# Patient Record
Sex: Male | Born: 1955 | Race: White | Hispanic: No | Marital: Single | Smoking: Former smoker
Health system: Southern US, Community
[De-identification: ages and names within clinical notes are randomized; demographics above are authoritative.]

## PROBLEM LIST (undated history)

## (undated) DIAGNOSIS — I10 Essential (primary) hypertension: Secondary | ICD-10-CM

## (undated) DIAGNOSIS — N189 Chronic kidney disease, unspecified: Secondary | ICD-10-CM

## (undated) DIAGNOSIS — I5022 Chronic systolic (congestive) heart failure: Secondary | ICD-10-CM

## (undated) DIAGNOSIS — R4789 Other speech disturbances: Secondary | ICD-10-CM

## (undated) DIAGNOSIS — I639 Cerebral infarction, unspecified: Secondary | ICD-10-CM

## (undated) DIAGNOSIS — E119 Type 2 diabetes mellitus without complications: Secondary | ICD-10-CM

## (undated) DIAGNOSIS — E785 Hyperlipidemia, unspecified: Secondary | ICD-10-CM

## (undated) DIAGNOSIS — Q159 Congenital malformation of eye, unspecified: Secondary | ICD-10-CM

## (undated) HISTORY — PX: ORIF ANKLE DISLOCATION: SUR918

## (undated) HISTORY — DX: Chronic kidney disease, unspecified: N18.9

---

## 2013-06-15 ENCOUNTER — Other Ambulatory Visit: Payer: Self-pay

## 2013-06-15 ENCOUNTER — Encounter (HOSPITAL_COMMUNITY): Payer: Self-pay | Admitting: Pharmacy Technician

## 2013-06-15 ENCOUNTER — Encounter (HOSPITAL_COMMUNITY): Payer: Self-pay

## 2013-06-15 ENCOUNTER — Encounter (HOSPITAL_COMMUNITY)
Admission: RE | Admit: 2013-06-15 | Discharge: 2013-06-15 | Disposition: A | Discharge status: Home / Self Care | Payer: Medicare Other | Source: Ambulatory Visit | Attending: Ophthalmology | Admitting: Ophthalmology

## 2013-06-15 HISTORY — DX: Type 2 diabetes mellitus without complications: E11.9

## 2013-06-15 HISTORY — DX: Congenital malformation of eye, unspecified: Q15.9

## 2013-06-15 HISTORY — DX: Essential (primary) hypertension: I10

## 2013-06-15 HISTORY — DX: Hyperlipidemia, unspecified: E78.5

## 2013-06-15 HISTORY — DX: Other speech disturbances: R47.89

## 2013-06-15 HISTORY — DX: Cerebral infarction, unspecified: I63.9

## 2013-06-15 LAB — HEMOGLOBIN AND HEMATOCRIT, BLOOD
HCT: 42.2 % (ref 39.0–52.0)
Hemoglobin: 14.7 g/dL (ref 13.0–17.0)

## 2013-06-15 LAB — BASIC METABOLIC PANEL
BUN: 19 mg/dL (ref 6–23)
CALCIUM: 9.6 mg/dL (ref 8.4–10.5)
CO2: 27 meq/L (ref 19–32)
CREATININE: 0.55 mg/dL (ref 0.50–1.35)
Chloride: 98 mEq/L (ref 96–112)
GFR calc Af Amer: >90 mL/min (ref >90)
Glucose, Bld: 181 mg/dL — ABNORMAL HIGH (ref 70–99)
Potassium: 4.3 mEq/L (ref 3.7–5.3)
SODIUM: 137 meq/L (ref 137–147)

## 2013-06-15 NOTE — Pre-Procedure Instructions (Signed)
Patient given information to sign up for my chart at home. 

## 2013-06-15 NOTE — Patient Instructions (Addendum)
Your procedure is scheduled on: 06/18/2013  Report to [facility] at  800  AM.  Call this number if you have problems the morning of surgery: [phone]   Do not eat food or drink liquids :After Midnight.      Take these medicines the morning of surgery with A SIP OF WATER: lisinopril, atenolol, amlodipine   Do not wear jewelry, make-up or nail polish.  Do not wear lotions, powders, or perfumes.   Do not shave 48 hours prior to surgery.  Do not bring valuables to the hospital.  Contacts, dentures or bridgework may not be worn into surgery.  Leave suitcase in the car. After surgery it may be brought to your room.  For patients admitted to the hospital, checkout time is 11:00 AM the day of discharge.   Patients discharged the day of surgery will not be allowed to drive home.  :     Please read over the following fact sheets that you were given: Coughing and Deep Breathing, Surgical Site Infection Prevention, Anesthesia Post-op Instructions and Care and Recovery After Surgery    Cataract A cataract is a clouding of the lens of the eye. When a lens becomes cloudy, vision is reduced based on the degree and nature of the clouding. Many cataracts reduce vision to some degree. Some cataracts make people more near-sighted as they develop. Other cataracts increase glare. Cataracts that are ignored and become worse can sometimes look white. The white color can be seen through the pupil. CAUSES   Aging. However, cataracts may occur at any age, even in newborns.   Certain drugs.   Trauma to the eye.   Certain diseases such as diabetes.   Specific eye diseases such as chronic inflammation inside the eye or a sudden attack of a rare form of glaucoma.   Inherited or acquired medical problems.  SYMPTOMS   Gradual, progressive drop in vision in the affected eye.   Severe, rapid visual loss. This most often happens when trauma is the cause.  DIAGNOSIS  To detect a cataract, an eye doctor  examines the lens. Cataracts are best diagnosed with an exam of the eyes with the pupils enlarged (dilated) by drops.  TREATMENT  For an early cataract, vision may improve by using different eyeglasses or stronger lighting. If that does not help your vision, surgery is the only effective treatment. A cataract needs to be surgically removed when vision loss interferes with your everyday activities, such as driving, reading, or watching TV. A cataract may also have to be removed if it prevents examination or treatment of another eye problem. Surgery removes the cloudy lens and usually replaces it with a substitute lens (intraocular lens, IOL).  At a time when both you and your doctor agree, the cataract will be surgically removed. If you have cataracts in both eyes, only one is usually removed at a time. This allows the operated eye to heal and be out of danger from any possible problems after surgery (such as infection or poor wound healing). In rare cases, a cataract may be doing damage to your eye. In these cases, your caregiver may advise surgical removal right away. The vast majority of people who have cataract surgery have better vision afterward. HOME CARE INSTRUCTIONS  If you are not planning surgery, you may be asked to do the following:  Use different eyeglasses.   Use stronger or brighter lighting.   Ask your eye doctor about reducing your medicine dose or changing  medicines if it is thought that a medicine caused your cataract. Changing medicines does not make the cataract go away on its own.   Become familiar with your surroundings. Poor vision can lead to injury. Avoid bumping into things on the affected side. You are at a higher risk for tripping or falling.   Exercise extreme care when driving or operating machinery.   Wear sunglasses if you are sensitive to bright [place] or experiencing problems with glare.  SEEK IMMEDIATE MEDICAL CARE IF:   You have a worsening or sudden vision  loss.   You notice redness, swelling, or increasing pain in the eye.   You have a fever.  Document Released: 01/25/2005 Document Revised: 01/14/2011 Document Reviewed: 09/18/2010 [facility] Patient Information 2012 [email or web address].PATIENT INSTRUCTIONS POST-ANESTHESIA  IMMEDIATELY FOLLOWING SURGERY:  Do not drive or operate machinery for the first twenty four hours after surgery.  Do not make any important decisions for twenty four hours after surgery or while taking narcotic pain medications or sedatives.  If you develop intractable nausea and vomiting or a severe headache please notify your doctor immediately.  FOLLOW-UP:  Please make an appointment with your surgeon as instructed. You do not need to follow up with anesthesia unless specifically instructed to do so.  WOUND CARE INSTRUCTIONS (if applicable):  Keep a dry clean dressing on the anesthesia/puncture wound site if there is drainage.  Once the wound has quit draining you may leave it open to air.  Generally you should leave the bandage intact for twenty four hours unless there is drainage.  If the epidural site drains for more than 36-48 hours please call the anesthesia department.  QUESTIONS?:  Please feel free to call your physician or the hospital operator if you have any questions, and they will be happy to assist you.

## 2013-06-18 ENCOUNTER — Encounter (HOSPITAL_COMMUNITY)
Admission: RE | Disposition: A | Discharge status: Home / Self Care | Payer: Self-pay | Source: Ambulatory Visit | Attending: Ophthalmology

## 2013-06-18 ENCOUNTER — Encounter (HOSPITAL_COMMUNITY): Payer: Self-pay | Admitting: Ophthalmology

## 2013-06-18 ENCOUNTER — Ambulatory Visit (HOSPITAL_COMMUNITY): Payer: Medicare Other | Admitting: Anesthesiology

## 2013-06-18 ENCOUNTER — Encounter (HOSPITAL_COMMUNITY): Payer: Medicare Other | Admitting: Anesthesiology

## 2013-06-18 ENCOUNTER — Ambulatory Visit (HOSPITAL_COMMUNITY)
Admission: RE | Admit: 2013-06-18 | Discharge: 2013-06-18 | Disposition: A | Discharge status: Home / Self Care | Payer: Medicare Other | Source: Ambulatory Visit | Attending: Ophthalmology | Admitting: Ophthalmology

## 2013-06-18 DIAGNOSIS — H2510 Age-related nuclear cataract, unspecified eye: Secondary | ICD-10-CM | POA: Insufficient documentation

## 2013-06-18 DIAGNOSIS — E119 Type 2 diabetes mellitus without complications: Secondary | ICD-10-CM | POA: Insufficient documentation

## 2013-06-18 DIAGNOSIS — Z87891 Personal history of nicotine dependence: Secondary | ICD-10-CM | POA: Insufficient documentation

## 2013-06-18 DIAGNOSIS — I10 Essential (primary) hypertension: Secondary | ICD-10-CM | POA: Insufficient documentation

## 2013-06-18 DIAGNOSIS — I6990 Unspecified sequelae of unspecified cerebrovascular disease: Secondary | ICD-10-CM | POA: Insufficient documentation

## 2013-06-18 HISTORY — PX: CATARACT EXTRACTION W/PHACO: SHX586

## 2013-06-18 LAB — GLUCOSE, CAPILLARY: GLUCOSE-CAPILLARY: 210 mg/dL — AB (ref 70–99)

## 2013-06-18 SURGERY — PHACOEMULSIFICATION, CATARACT, WITH IOL INSERTION
Anesthesia: Monitor Anesthesia Care | Site: Eye | Laterality: Right

## 2013-06-18 MED ORDER — PHENYLEPHRINE HCL 2.5 % OP SOLN
1.0000 [drp] | OPHTHALMIC | Status: AC
  Administered 2013-06-18 (×3): 1 [drp] via OPHTHALMIC

## 2013-06-18 MED ORDER — LACTATED RINGERS IV SOLN
INTRAVENOUS | Status: DC
  Administered 2013-06-18: 09:00:00 via INTRAVENOUS

## 2013-06-18 MED ORDER — MIDAZOLAM HCL 2 MG/2ML IJ SOLN
1.0000 mg | INTRAMUSCULAR | Status: DC | PRN
  Administered 2013-06-18: 2 mg via INTRAVENOUS

## 2013-06-18 MED ORDER — EPINEPHRINE HCL 1 MG/ML IJ SOLN
INTRAMUSCULAR | Status: DC | PRN
  Administered 2013-06-18: 10:00:00

## 2013-06-18 MED ORDER — POVIDONE-IODINE 5 % OP SOLN
OPHTHALMIC | Status: DC | PRN
  Administered 2013-06-18: 1 via OPHTHALMIC

## 2013-06-18 MED ORDER — LIDOCAINE HCL (PF) 1 % IJ SOLN
INTRAMUSCULAR | Status: DC | PRN
  Administered 2013-06-18: .6 mL

## 2013-06-18 MED ORDER — NEOMYCIN-POLYMYXIN-DEXAMETH 3.5-10000-0.1 OP SUSP
OPHTHALMIC | Status: DC | PRN
  Administered 2013-06-18: 2 [drp] via OPHTHALMIC

## 2013-06-18 MED ORDER — TETRACAINE HCL 0.5 % OP SOLN
1.0000 [drp] | OPHTHALMIC | Status: AC
  Administered 2013-06-18 (×3): 1 [drp] via OPHTHALMIC

## 2013-06-18 MED ORDER — LIDOCAINE HCL (PF) 1 % IJ SOLN
INTRAMUSCULAR | Status: AC
  Filled 2013-06-18: qty 2

## 2013-06-18 MED ORDER — BSS IO SOLN
INTRAOCULAR | Status: DC | PRN
  Administered 2013-06-18: 15 mL via INTRAOCULAR

## 2013-06-18 MED ORDER — TETRACAINE HCL 0.5 % OP SOLN
OPHTHALMIC | Status: AC
  Filled 2013-06-18: qty 2

## 2013-06-18 MED ORDER — MIDAZOLAM HCL 2 MG/2ML IJ SOLN
INTRAMUSCULAR | Status: AC
  Filled 2013-06-18: qty 2

## 2013-06-18 MED ORDER — CYCLOPENTOLATE-PHENYLEPHRINE 0.2-1 % OP SOLN
1.0000 [drp] | OPHTHALMIC | Status: AC
  Administered 2013-06-18 (×3): 1 [drp] via OPHTHALMIC

## 2013-06-18 MED ORDER — PROVISC 10 MG/ML IO SOLN
INTRAOCULAR | Status: DC | PRN
  Administered 2013-06-18: 0.85 mL via INTRAOCULAR

## 2013-06-18 MED ORDER — FENTANYL CITRATE 0.05 MG/ML IJ SOLN
25.0000 ug | INTRAMUSCULAR | Status: AC
  Administered 2013-06-18 (×2): 25 ug via INTRAVENOUS

## 2013-06-18 MED ORDER — LIDOCAINE HCL 3.5 % OP GEL
OPHTHALMIC | Status: AC
  Filled 2013-06-18: qty 1

## 2013-06-18 MED ORDER — EPINEPHRINE HCL 1 MG/ML IJ SOLN
INTRAMUSCULAR | Status: AC
  Filled 2013-06-18: qty 1

## 2013-06-18 MED ORDER — PHENYLEPHRINE HCL 2.5 % OP SOLN
OPHTHALMIC | Status: AC
  Filled 2013-06-18: qty 15

## 2013-06-18 MED ORDER — NEOMYCIN-POLYMYXIN-DEXAMETH 3.5-10000-0.1 OP SUSP
OPHTHALMIC | Status: AC
  Filled 2013-06-18: qty 5

## 2013-06-18 MED ORDER — CYCLOPENTOLATE-PHENYLEPHRINE OP SOLN OPTIME - NO CHARGE
OPHTHALMIC | Status: AC
  Filled 2013-06-18: qty 2

## 2013-06-18 MED ORDER — LIDOCAINE HCL 3.5 % OP GEL
1.0000 "application " | Freq: Once | OPHTHALMIC | Status: AC
  Administered 2013-06-18: 1 via OPHTHALMIC

## 2013-06-18 MED ORDER — FENTANYL CITRATE 0.05 MG/ML IJ SOLN
INTRAMUSCULAR | Status: AC
  Filled 2013-06-18: qty 2

## 2013-06-18 SURGICAL SUPPLY — 33 items
CAPSULAR TENSION RING-AMO (OPHTHALMIC RELATED) IMPLANT
CLOTH BEACON ORANGE TIMEOUT ST (SAFETY) ×3 IMPLANT
EYE SHIELD UNIVERSAL CLEAR (GAUZE/BANDAGES/DRESSINGS) ×3 IMPLANT
GLOVE BIO SURGEON STRL SZ 6.5 (GLOVE) IMPLANT
GLOVE BIO SURGEONS STRL SZ 6.5 (GLOVE)
GLOVE BIOGEL PI IND STRL 6.5 (GLOVE) IMPLANT
GLOVE BIOGEL PI IND STRL 7.0 (GLOVE) ×1 IMPLANT
GLOVE BIOGEL PI IND STRL 7.5 (GLOVE) ×1 IMPLANT
GLOVE BIOGEL PI INDICATOR 6.5 (GLOVE)
GLOVE BIOGEL PI INDICATOR 7.0 (GLOVE) ×2
GLOVE BIOGEL PI INDICATOR 7.5 (GLOVE) ×2
GLOVE ECLIPSE 6.5 STRL STRAW (GLOVE) IMPLANT
GLOVE ECLIPSE 7.0 STRL STRAW (GLOVE) IMPLANT
GLOVE ECLIPSE 7.5 STRL STRAW (GLOVE) IMPLANT
GLOVE EXAM NITRILE LRG STRL (GLOVE) IMPLANT
GLOVE EXAM NITRILE MD LF STRL (GLOVE) IMPLANT
GLOVE SKINSENSE NS SZ6.5 (GLOVE)
GLOVE SKINSENSE NS SZ7.0 (GLOVE)
GLOVE SKINSENSE STRL SZ6.5 (GLOVE) IMPLANT
GLOVE SKINSENSE STRL SZ7.0 (GLOVE) IMPLANT
KIT VITRECTOMY (OPHTHALMIC RELATED) IMPLANT
PAD ARMBOARD 7.5X6 YLW CONV (MISCELLANEOUS) ×3 IMPLANT
PROC W NO LENS (INTRAOCULAR LENS)
PROC W SPEC LENS (INTRAOCULAR LENS)
PROCESS W NO LENS (INTRAOCULAR LENS) IMPLANT
PROCESS W SPEC LENS (INTRAOCULAR LENS) IMPLANT
RING MALYGIN (MISCELLANEOUS) IMPLANT
SIGHTPATH CAT PROC W REG LENS (Ophthalmic Related) ×3 IMPLANT
SYR TB 1ML LL NO SAFETY (SYRINGE) ×3 IMPLANT
TAPE SURG TRANSPORE 1 IN (GAUZE/BANDAGES/DRESSINGS) ×1 IMPLANT
TAPE SURGICAL TRANSPORE 1 IN (GAUZE/BANDAGES/DRESSINGS) ×2
VISCOELASTIC ADDITIONAL (OPHTHALMIC RELATED) IMPLANT
WATER STERILE IRR 250ML POUR (IV SOLUTION) ×3 IMPLANT

## 2013-06-18 NOTE — Discharge Instructions (Signed)

## 2013-06-18 NOTE — Op Note (Signed)
Date of Admission: 06/18/2013  Date of Surgery: 06/18/2013   Pre-Op Dx: Cataract Right Eye  Post-Op Dx: Nuclear Cataract Right  Eye,  Dx Code 366.16  Surgeon: Gemma Payor, M.D.  Assistants: None  Anesthesia: Topical with MAC  Indications: Painless, progressive loss of vision with compromise of daily activities.  Surgery: Cataract Extraction with Intraocular lens Implant Right Eye  Discription: The patient had dilating drops and viscous lidocaine placed into the Right eye in the pre-op holding area. After transfer to the operating room, a time out was performed. The patient was then prepped and draped. Beginning with a 75 degree blade a paracentesis port was made at the surgeon's 2 o'clock position. The anterior chamber was then filled with 1% non-preserved lidocaine. This was followed by filling the anterior chamber with Provisc.  A 2.18mm keratome blade was used to make a clear corneal incision at the temporal limbus.  A bent cystatome needle was used to create a continuous tear capsulotomy. Hydrodissection was performed with balanced salt solution on a Fine canula. The lens nucleus was then removed using the phacoemulsification handpiece. Residual cortex was removed with the I&A handpiece. The anterior chamber and capsular bag were refilled with Provisc. A posterior chamber intraocular lens was placed into the capsular bag with it's injector. The implant was positioned with the Kuglan hook. The Provisc was then removed from the anterior chamber and capsular bag with the I&A handpiece. Stromal hydration of the main incision and paracentesis port was performed with BSS on a Fine canula. The wounds were tested for leak which was negative. The patient tolerated the procedure well. There were no operative complications. The patient was then transferred to the recovery room in stable condition.  Complications: None  Specimen: None  EBL: None  Prosthetic device: Hoya iSert 250, power 19.0 D, SN  [id number].

## 2013-06-18 NOTE — Anesthesia Postprocedure Evaluation (Signed)
  Anesthesia Post-op Note  Patient: Matthew Mccormick  Procedure(s) Performed: Procedure(s) with comments: CATARACT EXTRACTION PHACO AND INTRAOCULAR LENS PLACEMENT (IOC) (Right) - CDE 34.55  Patient Location: Short Stay  Anesthesia Type:MAC  Level of Consciousness: awake, alert , oriented and patient cooperative  Airway and Oxygen Therapy: Patient Spontanous Breathing and Patient connected to nasal cannula oxygen  Post-op Pain: none  Post-op Assessment: Post-op Vital signs reviewed, Patient's Cardiovascular Status Stable, Respiratory Function Stable, Patent Airway and Pain level controlled  Post-op Vital Signs: Reviewed and stable  Last Vitals:  Filed Vitals:   06/18/13 0921  BP: 146/74  Resp: 14    Complications: No apparent anesthesia complications

## 2013-06-18 NOTE — Transfer of Care (Signed)
Immediate Anesthesia Transfer of Care Note  Patient: Matthew Mccormick  Procedure(s) Performed: Procedure(s) with comments: CATARACT EXTRACTION PHACO AND INTRAOCULAR LENS PLACEMENT (IOC) (Right) - CDE 34.55  Patient Location: Short Stay  Anesthesia Type:MAC  Level of Consciousness: awake, alert , oriented and patient cooperative  Airway & Oxygen Therapy: Patient Spontanous Breathing  Post-op Assessment: Report given to PACU RN, Post -op Vital signs reviewed and stable and Patient moving all extremities  Post vital signs: Reviewed and stable  Complications: No apparent anesthesia complications

## 2013-06-18 NOTE — Anesthesia Preprocedure Evaluation (Signed)
Anesthesia Evaluation  Patient identified by MRN, date of birth, ID band Patient awake    Reviewed: Allergy & Precautions, H&P , NPO status , Patient's Chart, lab work & pertinent test results, reviewed documented beta blocker date and time   Airway Mallampati: II TM Distance: >3 FB Neck ROM: Full    Dental  (+) Poor Dentition, Missing, Chipped   Pulmonary former smoker,  breath sounds clear to auscultation        Cardiovascular hypertension, Pt. on medications and Pt. on home beta blockers Rhythm:Regular Rate:Normal     Neuro/Psych CVA, Residual Symptoms    GI/Hepatic   Endo/Other  diabetes, Type 2  Renal/GU      Musculoskeletal   Abdominal   Peds  Hematology   Anesthesia Other Findings   Reproductive/Obstetrics                           Anesthesia Physical Anesthesia Plan  ASA: III  Anesthesia Plan: MAC   Post-op Pain Management:    Induction: Intravenous  Airway Management Planned: Nasal Cannula  Additional Equipment:   Intra-op Plan:   Post-operative Plan:   Informed Consent: I have reviewed the patients History and Physical, chart, labs and discussed the procedure including the risks, benefits and alternatives for the proposed anesthesia with the patient or authorized representative who has indicated his/her understanding and acceptance.     Plan Discussed with:   Anesthesia Plan Comments:         Anesthesia Quick Evaluation

## 2013-06-18 NOTE — H&P (Signed)
I have reviewed the H&P, the patient was re-examined, and I have identified no interval changes in medical condition and plan of care since the history and physical of record  

## 2013-06-19 ENCOUNTER — Encounter (HOSPITAL_COMMUNITY): Payer: Self-pay | Admitting: Ophthalmology

## 2015-02-26 DIAGNOSIS — Z87891 Personal history of nicotine dependence: Secondary | ICD-10-CM | POA: Diagnosis not present

## 2015-02-26 DIAGNOSIS — E1165 Type 2 diabetes mellitus with hyperglycemia: Secondary | ICD-10-CM | POA: Diagnosis not present

## 2015-02-26 DIAGNOSIS — Z6834 Body mass index (BMI) 34.0-34.9, adult: Secondary | ICD-10-CM | POA: Diagnosis not present

## 2015-02-26 DIAGNOSIS — R2681 Unsteadiness on feet: Secondary | ICD-10-CM | POA: Diagnosis not present

## 2015-03-29 DIAGNOSIS — L03114 Cellulitis of left upper limb: Secondary | ICD-10-CM | POA: Diagnosis not present

## 2015-03-29 DIAGNOSIS — Z833 Family history of diabetes mellitus: Secondary | ICD-10-CM | POA: Diagnosis not present

## 2015-03-29 DIAGNOSIS — R21 Rash and other nonspecific skin eruption: Secondary | ICD-10-CM | POA: Diagnosis not present

## 2015-03-29 DIAGNOSIS — Z8249 Family history of ischemic heart disease and other diseases of the circulatory system: Secondary | ICD-10-CM | POA: Diagnosis not present

## 2015-04-16 DIAGNOSIS — I10 Essential (primary) hypertension: Secondary | ICD-10-CM | POA: Diagnosis not present

## 2015-04-16 DIAGNOSIS — E119 Type 2 diabetes mellitus without complications: Secondary | ICD-10-CM | POA: Diagnosis not present

## 2015-05-14 DIAGNOSIS — E7800 Pure hypercholesterolemia, unspecified: Secondary | ICD-10-CM | POA: Diagnosis not present

## 2015-05-14 DIAGNOSIS — E1165 Type 2 diabetes mellitus with hyperglycemia: Secondary | ICD-10-CM | POA: Diagnosis not present

## 2015-05-22 DIAGNOSIS — I10 Essential (primary) hypertension: Secondary | ICD-10-CM | POA: Diagnosis not present

## 2015-05-22 DIAGNOSIS — E119 Type 2 diabetes mellitus without complications: Secondary | ICD-10-CM | POA: Diagnosis not present

## 2015-06-12 DIAGNOSIS — I10 Essential (primary) hypertension: Secondary | ICD-10-CM | POA: Diagnosis not present

## 2015-06-12 DIAGNOSIS — I639 Cerebral infarction, unspecified: Secondary | ICD-10-CM | POA: Diagnosis not present

## 2015-06-12 DIAGNOSIS — E119 Type 2 diabetes mellitus without complications: Secondary | ICD-10-CM | POA: Diagnosis not present

## 2015-06-13 DIAGNOSIS — E1165 Type 2 diabetes mellitus with hyperglycemia: Secondary | ICD-10-CM | POA: Diagnosis not present

## 2015-06-13 DIAGNOSIS — I10 Essential (primary) hypertension: Secondary | ICD-10-CM | POA: Diagnosis not present

## 2015-06-13 DIAGNOSIS — E7800 Pure hypercholesterolemia, unspecified: Secondary | ICD-10-CM | POA: Diagnosis not present

## 2015-07-15 DIAGNOSIS — E119 Type 2 diabetes mellitus without complications: Secondary | ICD-10-CM | POA: Diagnosis not present

## 2015-07-15 DIAGNOSIS — I10 Essential (primary) hypertension: Secondary | ICD-10-CM | POA: Diagnosis not present

## 2015-07-15 DIAGNOSIS — I639 Cerebral infarction, unspecified: Secondary | ICD-10-CM | POA: Diagnosis not present

## 2015-07-18 DIAGNOSIS — E7800 Pure hypercholesterolemia, unspecified: Secondary | ICD-10-CM | POA: Diagnosis not present

## 2015-07-18 DIAGNOSIS — N181 Chronic kidney disease, stage 1: Secondary | ICD-10-CM | POA: Diagnosis not present

## 2015-07-18 DIAGNOSIS — E1122 Type 2 diabetes mellitus with diabetic chronic kidney disease: Secondary | ICD-10-CM | POA: Diagnosis not present

## 2015-07-18 DIAGNOSIS — I10 Essential (primary) hypertension: Secondary | ICD-10-CM | POA: Diagnosis not present

## 2015-08-21 DIAGNOSIS — R0600 Dyspnea, unspecified: Secondary | ICD-10-CM | POA: Diagnosis not present

## 2015-08-21 DIAGNOSIS — E1165 Type 2 diabetes mellitus with hyperglycemia: Secondary | ICD-10-CM | POA: Diagnosis not present

## 2015-08-21 DIAGNOSIS — I639 Cerebral infarction, unspecified: Secondary | ICD-10-CM | POA: Diagnosis not present

## 2015-08-21 DIAGNOSIS — I129 Hypertensive chronic kidney disease with stage 1 through stage 4 chronic kidney disease, or unspecified chronic kidney disease: Secondary | ICD-10-CM | POA: Diagnosis not present

## 2015-08-21 DIAGNOSIS — Z9114 Patient's other noncompliance with medication regimen: Secondary | ICD-10-CM | POA: Diagnosis not present

## 2015-08-22 DIAGNOSIS — E1122 Type 2 diabetes mellitus with diabetic chronic kidney disease: Secondary | ICD-10-CM | POA: Diagnosis present

## 2015-08-22 DIAGNOSIS — Z79899 Other long term (current) drug therapy: Secondary | ICD-10-CM | POA: Diagnosis not present

## 2015-08-22 DIAGNOSIS — R279 Unspecified lack of coordination: Secondary | ICD-10-CM | POA: Diagnosis not present

## 2015-08-22 DIAGNOSIS — Z9114 Patient's other noncompliance with medication regimen: Secondary | ICD-10-CM | POA: Diagnosis not present

## 2015-08-22 DIAGNOSIS — R2689 Other abnormalities of gait and mobility: Secondary | ICD-10-CM | POA: Diagnosis not present

## 2015-08-22 DIAGNOSIS — R278 Other lack of coordination: Secondary | ICD-10-CM | POA: Diagnosis not present

## 2015-08-22 DIAGNOSIS — E1069 Type 1 diabetes mellitus with other specified complication: Secondary | ICD-10-CM | POA: Diagnosis not present

## 2015-08-22 DIAGNOSIS — I10 Essential (primary) hypertension: Secondary | ICD-10-CM | POA: Diagnosis not present

## 2015-08-22 DIAGNOSIS — E119 Type 2 diabetes mellitus without complications: Secondary | ICD-10-CM | POA: Diagnosis not present

## 2015-08-22 DIAGNOSIS — R262 Difficulty in walking, not elsewhere classified: Secondary | ICD-10-CM | POA: Diagnosis not present

## 2015-08-22 DIAGNOSIS — E1165 Type 2 diabetes mellitus with hyperglycemia: Secondary | ICD-10-CM | POA: Diagnosis present

## 2015-08-22 DIAGNOSIS — I639 Cerebral infarction, unspecified: Secondary | ICD-10-CM | POA: Diagnosis present

## 2015-08-22 DIAGNOSIS — Z299 Encounter for prophylactic measures, unspecified: Secondary | ICD-10-CM | POA: Diagnosis not present

## 2015-08-22 DIAGNOSIS — Z7982 Long term (current) use of aspirin: Secondary | ICD-10-CM | POA: Diagnosis not present

## 2015-08-22 DIAGNOSIS — R269 Unspecified abnormalities of gait and mobility: Secondary | ICD-10-CM | POA: Diagnosis present

## 2015-08-22 DIAGNOSIS — N189 Chronic kidney disease, unspecified: Secondary | ICD-10-CM | POA: Diagnosis present

## 2015-08-22 DIAGNOSIS — M6281 Muscle weakness (generalized): Secondary | ICD-10-CM | POA: Diagnosis not present

## 2015-08-22 DIAGNOSIS — I129 Hypertensive chronic kidney disease with stage 1 through stage 4 chronic kidney disease, or unspecified chronic kidney disease: Secondary | ICD-10-CM | POA: Diagnosis present

## 2015-08-26 DIAGNOSIS — Z9114 Patient's other noncompliance with medication regimen: Secondary | ICD-10-CM | POA: Diagnosis not present

## 2015-08-26 DIAGNOSIS — Z299 Encounter for prophylactic measures, unspecified: Secondary | ICD-10-CM | POA: Diagnosis not present

## 2015-08-26 DIAGNOSIS — R279 Unspecified lack of coordination: Secondary | ICD-10-CM | POA: Diagnosis not present

## 2015-08-26 DIAGNOSIS — R2689 Other abnormalities of gait and mobility: Secondary | ICD-10-CM | POA: Diagnosis not present

## 2015-08-26 DIAGNOSIS — R262 Difficulty in walking, not elsewhere classified: Secondary | ICD-10-CM | POA: Diagnosis not present

## 2015-08-26 DIAGNOSIS — M6281 Muscle weakness (generalized): Secondary | ICD-10-CM | POA: Diagnosis not present

## 2015-08-26 DIAGNOSIS — E1165 Type 2 diabetes mellitus with hyperglycemia: Secondary | ICD-10-CM | POA: Diagnosis not present

## 2015-08-26 DIAGNOSIS — N189 Chronic kidney disease, unspecified: Secondary | ICD-10-CM | POA: Diagnosis not present

## 2015-08-26 DIAGNOSIS — E1069 Type 1 diabetes mellitus with other specified complication: Secondary | ICD-10-CM | POA: Diagnosis not present

## 2015-08-26 DIAGNOSIS — I129 Hypertensive chronic kidney disease with stage 1 through stage 4 chronic kidney disease, or unspecified chronic kidney disease: Secondary | ICD-10-CM | POA: Diagnosis not present

## 2015-08-26 DIAGNOSIS — E119 Type 2 diabetes mellitus without complications: Secondary | ICD-10-CM | POA: Diagnosis not present

## 2015-08-26 DIAGNOSIS — I639 Cerebral infarction, unspecified: Secondary | ICD-10-CM | POA: Diagnosis not present

## 2015-08-26 DIAGNOSIS — I10 Essential (primary) hypertension: Secondary | ICD-10-CM | POA: Diagnosis not present

## 2015-08-26 DIAGNOSIS — R278 Other lack of coordination: Secondary | ICD-10-CM | POA: Diagnosis not present

## 2015-08-28 DIAGNOSIS — E119 Type 2 diabetes mellitus without complications: Secondary | ICD-10-CM | POA: Diagnosis not present

## 2015-08-28 DIAGNOSIS — I639 Cerebral infarction, unspecified: Secondary | ICD-10-CM | POA: Diagnosis not present

## 2015-09-17 DIAGNOSIS — E1122 Type 2 diabetes mellitus with diabetic chronic kidney disease: Secondary | ICD-10-CM | POA: Diagnosis not present

## 2015-09-17 DIAGNOSIS — E785 Hyperlipidemia, unspecified: Secondary | ICD-10-CM | POA: Diagnosis not present

## 2015-09-17 DIAGNOSIS — E1165 Type 2 diabetes mellitus with hyperglycemia: Secondary | ICD-10-CM | POA: Diagnosis not present

## 2015-09-17 DIAGNOSIS — Z8673 Personal history of transient ischemic attack (TIA), and cerebral infarction without residual deficits: Secondary | ICD-10-CM | POA: Diagnosis not present

## 2015-09-17 DIAGNOSIS — I129 Hypertensive chronic kidney disease with stage 1 through stage 4 chronic kidney disease, or unspecified chronic kidney disease: Secondary | ICD-10-CM | POA: Diagnosis not present

## 2015-09-17 DIAGNOSIS — Z87891 Personal history of nicotine dependence: Secondary | ICD-10-CM | POA: Diagnosis not present

## 2015-09-17 DIAGNOSIS — Z794 Long term (current) use of insulin: Secondary | ICD-10-CM | POA: Diagnosis not present

## 2015-09-17 DIAGNOSIS — N189 Chronic kidney disease, unspecified: Secondary | ICD-10-CM | POA: Diagnosis not present

## 2015-09-17 DIAGNOSIS — Z9181 History of falling: Secondary | ICD-10-CM | POA: Diagnosis not present

## 2015-09-18 DIAGNOSIS — E785 Hyperlipidemia, unspecified: Secondary | ICD-10-CM | POA: Diagnosis not present

## 2015-09-18 DIAGNOSIS — E1165 Type 2 diabetes mellitus with hyperglycemia: Secondary | ICD-10-CM | POA: Diagnosis not present

## 2015-09-18 DIAGNOSIS — Z8673 Personal history of transient ischemic attack (TIA), and cerebral infarction without residual deficits: Secondary | ICD-10-CM | POA: Diagnosis not present

## 2015-09-18 DIAGNOSIS — N189 Chronic kidney disease, unspecified: Secondary | ICD-10-CM | POA: Diagnosis not present

## 2015-09-18 DIAGNOSIS — I129 Hypertensive chronic kidney disease with stage 1 through stage 4 chronic kidney disease, or unspecified chronic kidney disease: Secondary | ICD-10-CM | POA: Diagnosis not present

## 2015-09-18 DIAGNOSIS — E1122 Type 2 diabetes mellitus with diabetic chronic kidney disease: Secondary | ICD-10-CM | POA: Diagnosis not present

## 2015-09-19 DIAGNOSIS — E119 Type 2 diabetes mellitus without complications: Secondary | ICD-10-CM | POA: Diagnosis not present

## 2015-09-19 DIAGNOSIS — I10 Essential (primary) hypertension: Secondary | ICD-10-CM | POA: Diagnosis not present

## 2015-09-19 DIAGNOSIS — I639 Cerebral infarction, unspecified: Secondary | ICD-10-CM | POA: Diagnosis not present

## 2015-09-23 DIAGNOSIS — E1122 Type 2 diabetes mellitus with diabetic chronic kidney disease: Secondary | ICD-10-CM | POA: Diagnosis not present

## 2015-09-23 DIAGNOSIS — N181 Chronic kidney disease, stage 1: Secondary | ICD-10-CM | POA: Diagnosis not present

## 2015-09-23 DIAGNOSIS — E7800 Pure hypercholesterolemia, unspecified: Secondary | ICD-10-CM | POA: Diagnosis not present

## 2015-09-23 DIAGNOSIS — E1165 Type 2 diabetes mellitus with hyperglycemia: Secondary | ICD-10-CM | POA: Diagnosis not present

## 2015-09-23 DIAGNOSIS — E785 Hyperlipidemia, unspecified: Secondary | ICD-10-CM | POA: Diagnosis not present

## 2015-09-23 DIAGNOSIS — I129 Hypertensive chronic kidney disease with stage 1 through stage 4 chronic kidney disease, or unspecified chronic kidney disease: Secondary | ICD-10-CM | POA: Diagnosis not present

## 2015-09-23 DIAGNOSIS — I10 Essential (primary) hypertension: Secondary | ICD-10-CM | POA: Diagnosis not present

## 2015-09-23 DIAGNOSIS — Z299 Encounter for prophylactic measures, unspecified: Secondary | ICD-10-CM | POA: Diagnosis not present

## 2015-09-23 DIAGNOSIS — N189 Chronic kidney disease, unspecified: Secondary | ICD-10-CM | POA: Diagnosis not present

## 2015-09-23 DIAGNOSIS — Z8673 Personal history of transient ischemic attack (TIA), and cerebral infarction without residual deficits: Secondary | ICD-10-CM | POA: Diagnosis not present

## 2015-09-24 DIAGNOSIS — E1165 Type 2 diabetes mellitus with hyperglycemia: Secondary | ICD-10-CM | POA: Diagnosis not present

## 2015-09-24 DIAGNOSIS — E785 Hyperlipidemia, unspecified: Secondary | ICD-10-CM | POA: Diagnosis not present

## 2015-09-24 DIAGNOSIS — I129 Hypertensive chronic kidney disease with stage 1 through stage 4 chronic kidney disease, or unspecified chronic kidney disease: Secondary | ICD-10-CM | POA: Diagnosis not present

## 2015-09-24 DIAGNOSIS — Z8673 Personal history of transient ischemic attack (TIA), and cerebral infarction without residual deficits: Secondary | ICD-10-CM | POA: Diagnosis not present

## 2015-09-24 DIAGNOSIS — N189 Chronic kidney disease, unspecified: Secondary | ICD-10-CM | POA: Diagnosis not present

## 2015-09-24 DIAGNOSIS — E1122 Type 2 diabetes mellitus with diabetic chronic kidney disease: Secondary | ICD-10-CM | POA: Diagnosis not present

## 2015-09-25 DIAGNOSIS — E1122 Type 2 diabetes mellitus with diabetic chronic kidney disease: Secondary | ICD-10-CM | POA: Diagnosis not present

## 2015-09-25 DIAGNOSIS — N189 Chronic kidney disease, unspecified: Secondary | ICD-10-CM | POA: Diagnosis not present

## 2015-09-25 DIAGNOSIS — E1165 Type 2 diabetes mellitus with hyperglycemia: Secondary | ICD-10-CM | POA: Diagnosis not present

## 2015-09-25 DIAGNOSIS — Z8673 Personal history of transient ischemic attack (TIA), and cerebral infarction without residual deficits: Secondary | ICD-10-CM | POA: Diagnosis not present

## 2015-09-25 DIAGNOSIS — I129 Hypertensive chronic kidney disease with stage 1 through stage 4 chronic kidney disease, or unspecified chronic kidney disease: Secondary | ICD-10-CM | POA: Diagnosis not present

## 2015-09-25 DIAGNOSIS — E785 Hyperlipidemia, unspecified: Secondary | ICD-10-CM | POA: Diagnosis not present

## 2015-09-26 DIAGNOSIS — Z8673 Personal history of transient ischemic attack (TIA), and cerebral infarction without residual deficits: Secondary | ICD-10-CM | POA: Diagnosis not present

## 2015-09-26 DIAGNOSIS — E1165 Type 2 diabetes mellitus with hyperglycemia: Secondary | ICD-10-CM | POA: Diagnosis not present

## 2015-09-26 DIAGNOSIS — E1122 Type 2 diabetes mellitus with diabetic chronic kidney disease: Secondary | ICD-10-CM | POA: Diagnosis not present

## 2015-09-26 DIAGNOSIS — N189 Chronic kidney disease, unspecified: Secondary | ICD-10-CM | POA: Diagnosis not present

## 2015-09-26 DIAGNOSIS — E785 Hyperlipidemia, unspecified: Secondary | ICD-10-CM | POA: Diagnosis not present

## 2015-09-26 DIAGNOSIS — I129 Hypertensive chronic kidney disease with stage 1 through stage 4 chronic kidney disease, or unspecified chronic kidney disease: Secondary | ICD-10-CM | POA: Diagnosis not present

## 2015-09-29 DIAGNOSIS — I129 Hypertensive chronic kidney disease with stage 1 through stage 4 chronic kidney disease, or unspecified chronic kidney disease: Secondary | ICD-10-CM | POA: Diagnosis not present

## 2015-09-29 DIAGNOSIS — E785 Hyperlipidemia, unspecified: Secondary | ICD-10-CM | POA: Diagnosis not present

## 2015-09-29 DIAGNOSIS — E1122 Type 2 diabetes mellitus with diabetic chronic kidney disease: Secondary | ICD-10-CM | POA: Diagnosis not present

## 2015-09-29 DIAGNOSIS — Z8673 Personal history of transient ischemic attack (TIA), and cerebral infarction without residual deficits: Secondary | ICD-10-CM | POA: Diagnosis not present

## 2015-09-29 DIAGNOSIS — N189 Chronic kidney disease, unspecified: Secondary | ICD-10-CM | POA: Diagnosis not present

## 2015-09-29 DIAGNOSIS — E1165 Type 2 diabetes mellitus with hyperglycemia: Secondary | ICD-10-CM | POA: Diagnosis not present

## 2015-10-01 DIAGNOSIS — I129 Hypertensive chronic kidney disease with stage 1 through stage 4 chronic kidney disease, or unspecified chronic kidney disease: Secondary | ICD-10-CM | POA: Diagnosis not present

## 2015-10-01 DIAGNOSIS — Z8673 Personal history of transient ischemic attack (TIA), and cerebral infarction without residual deficits: Secondary | ICD-10-CM | POA: Diagnosis not present

## 2015-10-01 DIAGNOSIS — E785 Hyperlipidemia, unspecified: Secondary | ICD-10-CM | POA: Diagnosis not present

## 2015-10-01 DIAGNOSIS — N189 Chronic kidney disease, unspecified: Secondary | ICD-10-CM | POA: Diagnosis not present

## 2015-10-01 DIAGNOSIS — E1165 Type 2 diabetes mellitus with hyperglycemia: Secondary | ICD-10-CM | POA: Diagnosis not present

## 2015-10-01 DIAGNOSIS — E1122 Type 2 diabetes mellitus with diabetic chronic kidney disease: Secondary | ICD-10-CM | POA: Diagnosis not present

## 2015-10-02 DIAGNOSIS — E1165 Type 2 diabetes mellitus with hyperglycemia: Secondary | ICD-10-CM | POA: Diagnosis not present

## 2015-10-02 DIAGNOSIS — E1122 Type 2 diabetes mellitus with diabetic chronic kidney disease: Secondary | ICD-10-CM | POA: Diagnosis not present

## 2015-10-02 DIAGNOSIS — I129 Hypertensive chronic kidney disease with stage 1 through stage 4 chronic kidney disease, or unspecified chronic kidney disease: Secondary | ICD-10-CM | POA: Diagnosis not present

## 2015-10-02 DIAGNOSIS — E785 Hyperlipidemia, unspecified: Secondary | ICD-10-CM | POA: Diagnosis not present

## 2015-10-02 DIAGNOSIS — N189 Chronic kidney disease, unspecified: Secondary | ICD-10-CM | POA: Diagnosis not present

## 2015-10-02 DIAGNOSIS — Z8673 Personal history of transient ischemic attack (TIA), and cerebral infarction without residual deficits: Secondary | ICD-10-CM | POA: Diagnosis not present

## 2015-10-07 DIAGNOSIS — E1122 Type 2 diabetes mellitus with diabetic chronic kidney disease: Secondary | ICD-10-CM | POA: Diagnosis not present

## 2015-10-07 DIAGNOSIS — E7800 Pure hypercholesterolemia, unspecified: Secondary | ICD-10-CM | POA: Diagnosis not present

## 2015-10-07 DIAGNOSIS — I10 Essential (primary) hypertension: Secondary | ICD-10-CM | POA: Diagnosis not present

## 2015-10-07 DIAGNOSIS — N181 Chronic kidney disease, stage 1: Secondary | ICD-10-CM | POA: Diagnosis not present

## 2015-10-08 DIAGNOSIS — N189 Chronic kidney disease, unspecified: Secondary | ICD-10-CM | POA: Diagnosis not present

## 2015-10-08 DIAGNOSIS — Z8673 Personal history of transient ischemic attack (TIA), and cerebral infarction without residual deficits: Secondary | ICD-10-CM | POA: Diagnosis not present

## 2015-10-08 DIAGNOSIS — I129 Hypertensive chronic kidney disease with stage 1 through stage 4 chronic kidney disease, or unspecified chronic kidney disease: Secondary | ICD-10-CM | POA: Diagnosis not present

## 2015-10-08 DIAGNOSIS — E1122 Type 2 diabetes mellitus with diabetic chronic kidney disease: Secondary | ICD-10-CM | POA: Diagnosis not present

## 2015-10-08 DIAGNOSIS — E1165 Type 2 diabetes mellitus with hyperglycemia: Secondary | ICD-10-CM | POA: Diagnosis not present

## 2015-10-08 DIAGNOSIS — E785 Hyperlipidemia, unspecified: Secondary | ICD-10-CM | POA: Diagnosis not present

## 2015-10-09 DIAGNOSIS — I129 Hypertensive chronic kidney disease with stage 1 through stage 4 chronic kidney disease, or unspecified chronic kidney disease: Secondary | ICD-10-CM | POA: Diagnosis not present

## 2015-10-09 DIAGNOSIS — E785 Hyperlipidemia, unspecified: Secondary | ICD-10-CM | POA: Diagnosis not present

## 2015-10-09 DIAGNOSIS — E1165 Type 2 diabetes mellitus with hyperglycemia: Secondary | ICD-10-CM | POA: Diagnosis not present

## 2015-10-09 DIAGNOSIS — Z8673 Personal history of transient ischemic attack (TIA), and cerebral infarction without residual deficits: Secondary | ICD-10-CM | POA: Diagnosis not present

## 2015-10-09 DIAGNOSIS — E1122 Type 2 diabetes mellitus with diabetic chronic kidney disease: Secondary | ICD-10-CM | POA: Diagnosis not present

## 2015-10-09 DIAGNOSIS — N189 Chronic kidney disease, unspecified: Secondary | ICD-10-CM | POA: Diagnosis not present

## 2015-10-10 DIAGNOSIS — E1122 Type 2 diabetes mellitus with diabetic chronic kidney disease: Secondary | ICD-10-CM | POA: Diagnosis not present

## 2015-10-10 DIAGNOSIS — E1165 Type 2 diabetes mellitus with hyperglycemia: Secondary | ICD-10-CM | POA: Diagnosis not present

## 2015-10-10 DIAGNOSIS — Z8673 Personal history of transient ischemic attack (TIA), and cerebral infarction without residual deficits: Secondary | ICD-10-CM | POA: Diagnosis not present

## 2015-10-10 DIAGNOSIS — E785 Hyperlipidemia, unspecified: Secondary | ICD-10-CM | POA: Diagnosis not present

## 2015-10-10 DIAGNOSIS — I129 Hypertensive chronic kidney disease with stage 1 through stage 4 chronic kidney disease, or unspecified chronic kidney disease: Secondary | ICD-10-CM | POA: Diagnosis not present

## 2015-10-10 DIAGNOSIS — N189 Chronic kidney disease, unspecified: Secondary | ICD-10-CM | POA: Diagnosis not present

## 2015-10-13 DIAGNOSIS — E785 Hyperlipidemia, unspecified: Secondary | ICD-10-CM | POA: Diagnosis not present

## 2015-10-13 DIAGNOSIS — E1122 Type 2 diabetes mellitus with diabetic chronic kidney disease: Secondary | ICD-10-CM | POA: Diagnosis not present

## 2015-10-13 DIAGNOSIS — Z8673 Personal history of transient ischemic attack (TIA), and cerebral infarction without residual deficits: Secondary | ICD-10-CM | POA: Diagnosis not present

## 2015-10-13 DIAGNOSIS — I129 Hypertensive chronic kidney disease with stage 1 through stage 4 chronic kidney disease, or unspecified chronic kidney disease: Secondary | ICD-10-CM | POA: Diagnosis not present

## 2015-10-13 DIAGNOSIS — E1165 Type 2 diabetes mellitus with hyperglycemia: Secondary | ICD-10-CM | POA: Diagnosis not present

## 2015-10-13 DIAGNOSIS — N189 Chronic kidney disease, unspecified: Secondary | ICD-10-CM | POA: Diagnosis not present

## 2015-10-14 DIAGNOSIS — I129 Hypertensive chronic kidney disease with stage 1 through stage 4 chronic kidney disease, or unspecified chronic kidney disease: Secondary | ICD-10-CM | POA: Diagnosis not present

## 2015-10-14 DIAGNOSIS — E785 Hyperlipidemia, unspecified: Secondary | ICD-10-CM | POA: Diagnosis not present

## 2015-10-14 DIAGNOSIS — E1122 Type 2 diabetes mellitus with diabetic chronic kidney disease: Secondary | ICD-10-CM | POA: Diagnosis not present

## 2015-10-14 DIAGNOSIS — E1165 Type 2 diabetes mellitus with hyperglycemia: Secondary | ICD-10-CM | POA: Diagnosis not present

## 2015-10-14 DIAGNOSIS — Z8673 Personal history of transient ischemic attack (TIA), and cerebral infarction without residual deficits: Secondary | ICD-10-CM | POA: Diagnosis not present

## 2015-10-14 DIAGNOSIS — N189 Chronic kidney disease, unspecified: Secondary | ICD-10-CM | POA: Diagnosis not present

## 2015-10-15 DIAGNOSIS — E785 Hyperlipidemia, unspecified: Secondary | ICD-10-CM | POA: Diagnosis not present

## 2015-10-15 DIAGNOSIS — I129 Hypertensive chronic kidney disease with stage 1 through stage 4 chronic kidney disease, or unspecified chronic kidney disease: Secondary | ICD-10-CM | POA: Diagnosis not present

## 2015-10-15 DIAGNOSIS — E1122 Type 2 diabetes mellitus with diabetic chronic kidney disease: Secondary | ICD-10-CM | POA: Diagnosis not present

## 2015-10-15 DIAGNOSIS — N189 Chronic kidney disease, unspecified: Secondary | ICD-10-CM | POA: Diagnosis not present

## 2015-10-15 DIAGNOSIS — Z8673 Personal history of transient ischemic attack (TIA), and cerebral infarction without residual deficits: Secondary | ICD-10-CM | POA: Diagnosis not present

## 2015-10-15 DIAGNOSIS — E1165 Type 2 diabetes mellitus with hyperglycemia: Secondary | ICD-10-CM | POA: Diagnosis not present

## 2015-10-20 DIAGNOSIS — Z8673 Personal history of transient ischemic attack (TIA), and cerebral infarction without residual deficits: Secondary | ICD-10-CM | POA: Diagnosis not present

## 2015-10-20 DIAGNOSIS — E1165 Type 2 diabetes mellitus with hyperglycemia: Secondary | ICD-10-CM | POA: Diagnosis not present

## 2015-10-20 DIAGNOSIS — N189 Chronic kidney disease, unspecified: Secondary | ICD-10-CM | POA: Diagnosis not present

## 2015-10-20 DIAGNOSIS — I129 Hypertensive chronic kidney disease with stage 1 through stage 4 chronic kidney disease, or unspecified chronic kidney disease: Secondary | ICD-10-CM | POA: Diagnosis not present

## 2015-10-20 DIAGNOSIS — E1122 Type 2 diabetes mellitus with diabetic chronic kidney disease: Secondary | ICD-10-CM | POA: Diagnosis not present

## 2015-10-20 DIAGNOSIS — E785 Hyperlipidemia, unspecified: Secondary | ICD-10-CM | POA: Diagnosis not present

## 2015-10-21 DIAGNOSIS — E1165 Type 2 diabetes mellitus with hyperglycemia: Secondary | ICD-10-CM | POA: Diagnosis not present

## 2015-10-21 DIAGNOSIS — N189 Chronic kidney disease, unspecified: Secondary | ICD-10-CM | POA: Diagnosis not present

## 2015-10-21 DIAGNOSIS — Z8673 Personal history of transient ischemic attack (TIA), and cerebral infarction without residual deficits: Secondary | ICD-10-CM | POA: Diagnosis not present

## 2015-10-21 DIAGNOSIS — E785 Hyperlipidemia, unspecified: Secondary | ICD-10-CM | POA: Diagnosis not present

## 2015-10-21 DIAGNOSIS — I129 Hypertensive chronic kidney disease with stage 1 through stage 4 chronic kidney disease, or unspecified chronic kidney disease: Secondary | ICD-10-CM | POA: Diagnosis not present

## 2015-10-21 DIAGNOSIS — E1122 Type 2 diabetes mellitus with diabetic chronic kidney disease: Secondary | ICD-10-CM | POA: Diagnosis not present

## 2015-10-22 DIAGNOSIS — N189 Chronic kidney disease, unspecified: Secondary | ICD-10-CM | POA: Diagnosis not present

## 2015-10-22 DIAGNOSIS — E1122 Type 2 diabetes mellitus with diabetic chronic kidney disease: Secondary | ICD-10-CM | POA: Diagnosis not present

## 2015-10-22 DIAGNOSIS — E785 Hyperlipidemia, unspecified: Secondary | ICD-10-CM | POA: Diagnosis not present

## 2015-10-22 DIAGNOSIS — Z8673 Personal history of transient ischemic attack (TIA), and cerebral infarction without residual deficits: Secondary | ICD-10-CM | POA: Diagnosis not present

## 2015-10-22 DIAGNOSIS — I129 Hypertensive chronic kidney disease with stage 1 through stage 4 chronic kidney disease, or unspecified chronic kidney disease: Secondary | ICD-10-CM | POA: Diagnosis not present

## 2015-10-22 DIAGNOSIS — E1165 Type 2 diabetes mellitus with hyperglycemia: Secondary | ICD-10-CM | POA: Diagnosis not present

## 2015-10-27 DIAGNOSIS — N189 Chronic kidney disease, unspecified: Secondary | ICD-10-CM | POA: Diagnosis not present

## 2015-10-27 DIAGNOSIS — Z8673 Personal history of transient ischemic attack (TIA), and cerebral infarction without residual deficits: Secondary | ICD-10-CM | POA: Diagnosis not present

## 2015-10-27 DIAGNOSIS — E785 Hyperlipidemia, unspecified: Secondary | ICD-10-CM | POA: Diagnosis not present

## 2015-10-27 DIAGNOSIS — E1122 Type 2 diabetes mellitus with diabetic chronic kidney disease: Secondary | ICD-10-CM | POA: Diagnosis not present

## 2015-10-27 DIAGNOSIS — I129 Hypertensive chronic kidney disease with stage 1 through stage 4 chronic kidney disease, or unspecified chronic kidney disease: Secondary | ICD-10-CM | POA: Diagnosis not present

## 2015-10-27 DIAGNOSIS — E1165 Type 2 diabetes mellitus with hyperglycemia: Secondary | ICD-10-CM | POA: Diagnosis not present

## 2015-10-29 DIAGNOSIS — I129 Hypertensive chronic kidney disease with stage 1 through stage 4 chronic kidney disease, or unspecified chronic kidney disease: Secondary | ICD-10-CM | POA: Diagnosis not present

## 2015-10-29 DIAGNOSIS — E1122 Type 2 diabetes mellitus with diabetic chronic kidney disease: Secondary | ICD-10-CM | POA: Diagnosis not present

## 2015-10-29 DIAGNOSIS — E785 Hyperlipidemia, unspecified: Secondary | ICD-10-CM | POA: Diagnosis not present

## 2015-10-29 DIAGNOSIS — E1165 Type 2 diabetes mellitus with hyperglycemia: Secondary | ICD-10-CM | POA: Diagnosis not present

## 2015-10-29 DIAGNOSIS — N189 Chronic kidney disease, unspecified: Secondary | ICD-10-CM | POA: Diagnosis not present

## 2015-10-29 DIAGNOSIS — Z8673 Personal history of transient ischemic attack (TIA), and cerebral infarction without residual deficits: Secondary | ICD-10-CM | POA: Diagnosis not present

## 2015-11-03 DIAGNOSIS — Z8673 Personal history of transient ischemic attack (TIA), and cerebral infarction without residual deficits: Secondary | ICD-10-CM | POA: Diagnosis not present

## 2015-11-03 DIAGNOSIS — E1165 Type 2 diabetes mellitus with hyperglycemia: Secondary | ICD-10-CM | POA: Diagnosis not present

## 2015-11-03 DIAGNOSIS — E1122 Type 2 diabetes mellitus with diabetic chronic kidney disease: Secondary | ICD-10-CM | POA: Diagnosis not present

## 2015-11-03 DIAGNOSIS — N189 Chronic kidney disease, unspecified: Secondary | ICD-10-CM | POA: Diagnosis not present

## 2015-11-03 DIAGNOSIS — E785 Hyperlipidemia, unspecified: Secondary | ICD-10-CM | POA: Diagnosis not present

## 2015-11-03 DIAGNOSIS — I129 Hypertensive chronic kidney disease with stage 1 through stage 4 chronic kidney disease, or unspecified chronic kidney disease: Secondary | ICD-10-CM | POA: Diagnosis not present

## 2015-11-05 DIAGNOSIS — Z0000 Encounter for general adult medical examination without abnormal findings: Secondary | ICD-10-CM | POA: Diagnosis not present

## 2015-11-05 DIAGNOSIS — Z6834 Body mass index (BMI) 34.0-34.9, adult: Secondary | ICD-10-CM | POA: Diagnosis not present

## 2015-11-05 DIAGNOSIS — Z8673 Personal history of transient ischemic attack (TIA), and cerebral infarction without residual deficits: Secondary | ICD-10-CM | POA: Diagnosis not present

## 2015-11-05 DIAGNOSIS — E1165 Type 2 diabetes mellitus with hyperglycemia: Secondary | ICD-10-CM | POA: Diagnosis not present

## 2015-11-05 DIAGNOSIS — I129 Hypertensive chronic kidney disease with stage 1 through stage 4 chronic kidney disease, or unspecified chronic kidney disease: Secondary | ICD-10-CM | POA: Diagnosis not present

## 2015-11-05 DIAGNOSIS — Z1389 Encounter for screening for other disorder: Secondary | ICD-10-CM | POA: Diagnosis not present

## 2015-11-05 DIAGNOSIS — E785 Hyperlipidemia, unspecified: Secondary | ICD-10-CM | POA: Diagnosis not present

## 2015-11-05 DIAGNOSIS — E1122 Type 2 diabetes mellitus with diabetic chronic kidney disease: Secondary | ICD-10-CM | POA: Diagnosis not present

## 2015-11-05 DIAGNOSIS — N189 Chronic kidney disease, unspecified: Secondary | ICD-10-CM | POA: Diagnosis not present

## 2015-11-05 DIAGNOSIS — Z299 Encounter for prophylactic measures, unspecified: Secondary | ICD-10-CM | POA: Diagnosis not present

## 2015-11-05 DIAGNOSIS — Z1211 Encounter for screening for malignant neoplasm of colon: Secondary | ICD-10-CM | POA: Diagnosis not present

## 2015-11-05 DIAGNOSIS — Z7189 Other specified counseling: Secondary | ICD-10-CM | POA: Diagnosis not present

## 2015-11-06 DIAGNOSIS — I639 Cerebral infarction, unspecified: Secondary | ICD-10-CM | POA: Diagnosis not present

## 2015-11-06 DIAGNOSIS — I10 Essential (primary) hypertension: Secondary | ICD-10-CM | POA: Diagnosis not present

## 2015-11-06 DIAGNOSIS — E119 Type 2 diabetes mellitus without complications: Secondary | ICD-10-CM | POA: Diagnosis not present

## 2015-11-07 DIAGNOSIS — E1165 Type 2 diabetes mellitus with hyperglycemia: Secondary | ICD-10-CM | POA: Diagnosis not present

## 2015-11-07 DIAGNOSIS — E785 Hyperlipidemia, unspecified: Secondary | ICD-10-CM | POA: Diagnosis not present

## 2015-11-07 DIAGNOSIS — E1122 Type 2 diabetes mellitus with diabetic chronic kidney disease: Secondary | ICD-10-CM | POA: Diagnosis not present

## 2015-11-07 DIAGNOSIS — N189 Chronic kidney disease, unspecified: Secondary | ICD-10-CM | POA: Diagnosis not present

## 2015-11-07 DIAGNOSIS — I129 Hypertensive chronic kidney disease with stage 1 through stage 4 chronic kidney disease, or unspecified chronic kidney disease: Secondary | ICD-10-CM | POA: Diagnosis not present

## 2015-11-07 DIAGNOSIS — Z8673 Personal history of transient ischemic attack (TIA), and cerebral infarction without residual deficits: Secondary | ICD-10-CM | POA: Diagnosis not present

## 2015-11-10 DIAGNOSIS — I129 Hypertensive chronic kidney disease with stage 1 through stage 4 chronic kidney disease, or unspecified chronic kidney disease: Secondary | ICD-10-CM | POA: Diagnosis not present

## 2015-11-10 DIAGNOSIS — E785 Hyperlipidemia, unspecified: Secondary | ICD-10-CM | POA: Diagnosis not present

## 2015-11-10 DIAGNOSIS — Z8673 Personal history of transient ischemic attack (TIA), and cerebral infarction without residual deficits: Secondary | ICD-10-CM | POA: Diagnosis not present

## 2015-11-10 DIAGNOSIS — N189 Chronic kidney disease, unspecified: Secondary | ICD-10-CM | POA: Diagnosis not present

## 2015-11-10 DIAGNOSIS — E1122 Type 2 diabetes mellitus with diabetic chronic kidney disease: Secondary | ICD-10-CM | POA: Diagnosis not present

## 2015-11-10 DIAGNOSIS — E1165 Type 2 diabetes mellitus with hyperglycemia: Secondary | ICD-10-CM | POA: Diagnosis not present

## 2015-11-11 DIAGNOSIS — E1165 Type 2 diabetes mellitus with hyperglycemia: Secondary | ICD-10-CM | POA: Diagnosis not present

## 2015-11-11 DIAGNOSIS — N189 Chronic kidney disease, unspecified: Secondary | ICD-10-CM | POA: Diagnosis not present

## 2015-11-11 DIAGNOSIS — Z8673 Personal history of transient ischemic attack (TIA), and cerebral infarction without residual deficits: Secondary | ICD-10-CM | POA: Diagnosis not present

## 2015-11-11 DIAGNOSIS — I129 Hypertensive chronic kidney disease with stage 1 through stage 4 chronic kidney disease, or unspecified chronic kidney disease: Secondary | ICD-10-CM | POA: Diagnosis not present

## 2015-11-11 DIAGNOSIS — E785 Hyperlipidemia, unspecified: Secondary | ICD-10-CM | POA: Diagnosis not present

## 2015-11-11 DIAGNOSIS — E1122 Type 2 diabetes mellitus with diabetic chronic kidney disease: Secondary | ICD-10-CM | POA: Diagnosis not present

## 2015-11-12 DIAGNOSIS — E1122 Type 2 diabetes mellitus with diabetic chronic kidney disease: Secondary | ICD-10-CM | POA: Diagnosis not present

## 2015-11-12 DIAGNOSIS — N189 Chronic kidney disease, unspecified: Secondary | ICD-10-CM | POA: Diagnosis not present

## 2015-11-12 DIAGNOSIS — Z8673 Personal history of transient ischemic attack (TIA), and cerebral infarction without residual deficits: Secondary | ICD-10-CM | POA: Diagnosis not present

## 2015-11-12 DIAGNOSIS — E1165 Type 2 diabetes mellitus with hyperglycemia: Secondary | ICD-10-CM | POA: Diagnosis not present

## 2015-11-12 DIAGNOSIS — E785 Hyperlipidemia, unspecified: Secondary | ICD-10-CM | POA: Diagnosis not present

## 2015-11-12 DIAGNOSIS — I129 Hypertensive chronic kidney disease with stage 1 through stage 4 chronic kidney disease, or unspecified chronic kidney disease: Secondary | ICD-10-CM | POA: Diagnosis not present

## 2015-11-24 DIAGNOSIS — E113393 Type 2 diabetes mellitus with moderate nonproliferative diabetic retinopathy without macular edema, bilateral: Secondary | ICD-10-CM | POA: Diagnosis not present

## 2015-11-24 DIAGNOSIS — Z7984 Long term (current) use of oral hypoglycemic drugs: Secondary | ICD-10-CM | POA: Diagnosis not present

## 2015-11-24 DIAGNOSIS — H2512 Age-related nuclear cataract, left eye: Secondary | ICD-10-CM | POA: Diagnosis not present

## 2015-11-24 DIAGNOSIS — E1165 Type 2 diabetes mellitus with hyperglycemia: Secondary | ICD-10-CM | POA: Diagnosis not present

## 2015-11-27 DIAGNOSIS — Z23 Encounter for immunization: Secondary | ICD-10-CM | POA: Diagnosis not present

## 2015-12-09 DIAGNOSIS — E113491 Type 2 diabetes mellitus with severe nonproliferative diabetic retinopathy without macular edema, right eye: Secondary | ICD-10-CM | POA: Diagnosis not present

## 2015-12-09 DIAGNOSIS — H35373 Puckering of macula, bilateral: Secondary | ICD-10-CM | POA: Diagnosis not present

## 2015-12-09 DIAGNOSIS — E113392 Type 2 diabetes mellitus with moderate nonproliferative diabetic retinopathy without macular edema, left eye: Secondary | ICD-10-CM | POA: Diagnosis not present

## 2015-12-09 DIAGNOSIS — H33322 Round hole, left eye: Secondary | ICD-10-CM | POA: Diagnosis not present

## 2015-12-25 DIAGNOSIS — I639 Cerebral infarction, unspecified: Secondary | ICD-10-CM | POA: Diagnosis not present

## 2015-12-25 DIAGNOSIS — E119 Type 2 diabetes mellitus without complications: Secondary | ICD-10-CM | POA: Diagnosis not present

## 2015-12-25 DIAGNOSIS — I10 Essential (primary) hypertension: Secondary | ICD-10-CM | POA: Diagnosis not present

## 2016-01-15 DIAGNOSIS — Z299 Encounter for prophylactic measures, unspecified: Secondary | ICD-10-CM | POA: Diagnosis not present

## 2016-01-15 DIAGNOSIS — R5383 Other fatigue: Secondary | ICD-10-CM | POA: Diagnosis not present

## 2016-01-15 DIAGNOSIS — E7800 Pure hypercholesterolemia, unspecified: Secondary | ICD-10-CM | POA: Diagnosis not present

## 2016-01-15 DIAGNOSIS — N181 Chronic kidney disease, stage 1: Secondary | ICD-10-CM | POA: Diagnosis not present

## 2016-01-15 DIAGNOSIS — Z6831 Body mass index (BMI) 31.0-31.9, adult: Secondary | ICD-10-CM | POA: Diagnosis not present

## 2016-01-15 DIAGNOSIS — Z713 Dietary counseling and surveillance: Secondary | ICD-10-CM | POA: Diagnosis not present

## 2016-01-15 DIAGNOSIS — Z125 Encounter for screening for malignant neoplasm of prostate: Secondary | ICD-10-CM | POA: Diagnosis not present

## 2016-01-15 DIAGNOSIS — Z79899 Other long term (current) drug therapy: Secondary | ICD-10-CM | POA: Diagnosis not present

## 2016-01-15 DIAGNOSIS — E1122 Type 2 diabetes mellitus with diabetic chronic kidney disease: Secondary | ICD-10-CM | POA: Diagnosis not present

## 2016-01-20 DIAGNOSIS — I639 Cerebral infarction, unspecified: Secondary | ICD-10-CM | POA: Diagnosis not present

## 2016-01-20 DIAGNOSIS — E119 Type 2 diabetes mellitus without complications: Secondary | ICD-10-CM | POA: Diagnosis not present

## 2016-01-20 DIAGNOSIS — I10 Essential (primary) hypertension: Secondary | ICD-10-CM | POA: Diagnosis not present

## 2016-03-04 DIAGNOSIS — E119 Type 2 diabetes mellitus without complications: Secondary | ICD-10-CM | POA: Diagnosis not present

## 2016-03-04 DIAGNOSIS — I639 Cerebral infarction, unspecified: Secondary | ICD-10-CM | POA: Diagnosis not present

## 2016-03-04 DIAGNOSIS — I10 Essential (primary) hypertension: Secondary | ICD-10-CM | POA: Diagnosis not present

## 2016-03-08 DIAGNOSIS — H53462 Homonymous bilateral field defects, left side: Secondary | ICD-10-CM | POA: Diagnosis not present

## 2016-03-08 DIAGNOSIS — I672 Cerebral atherosclerosis: Secondary | ICD-10-CM | POA: Diagnosis not present

## 2016-03-08 DIAGNOSIS — I639 Cerebral infarction, unspecified: Secondary | ICD-10-CM | POA: Diagnosis not present

## 2016-03-08 DIAGNOSIS — I69891 Dysphagia following other cerebrovascular disease: Secondary | ICD-10-CM | POA: Diagnosis not present

## 2016-03-08 DIAGNOSIS — R4701 Aphasia: Secondary | ICD-10-CM | POA: Diagnosis not present

## 2016-03-08 DIAGNOSIS — I6789 Other cerebrovascular disease: Secondary | ICD-10-CM | POA: Diagnosis not present

## 2016-03-08 DIAGNOSIS — Z9114 Patient's other noncompliance with medication regimen: Secondary | ICD-10-CM | POA: Diagnosis not present

## 2016-03-08 DIAGNOSIS — I69822 Dysarthria following other cerebrovascular disease: Secondary | ICD-10-CM | POA: Diagnosis not present

## 2016-03-08 DIAGNOSIS — Z794 Long term (current) use of insulin: Secondary | ICD-10-CM | POA: Diagnosis not present

## 2016-03-08 DIAGNOSIS — I69319 Unspecified symptoms and signs involving cognitive functions following cerebral infarction: Secondary | ICD-10-CM | POA: Diagnosis not present

## 2016-03-08 DIAGNOSIS — R410 Disorientation, unspecified: Secondary | ICD-10-CM | POA: Diagnosis not present

## 2016-03-08 DIAGNOSIS — R748 Abnormal levels of other serum enzymes: Secondary | ICD-10-CM | POA: Diagnosis not present

## 2016-03-08 DIAGNOSIS — R262 Difficulty in walking, not elsewhere classified: Secondary | ICD-10-CM | POA: Diagnosis not present

## 2016-03-08 DIAGNOSIS — E669 Obesity, unspecified: Secondary | ICD-10-CM | POA: Diagnosis not present

## 2016-03-08 DIAGNOSIS — I6930 Unspecified sequelae of cerebral infarction: Secondary | ICD-10-CM | POA: Diagnosis not present

## 2016-03-08 DIAGNOSIS — R29810 Facial weakness: Secondary | ICD-10-CM | POA: Diagnosis not present

## 2016-03-08 DIAGNOSIS — I2510 Atherosclerotic heart disease of native coronary artery without angina pectoris: Secondary | ICD-10-CM | POA: Diagnosis present

## 2016-03-08 DIAGNOSIS — I10 Essential (primary) hypertension: Secondary | ICD-10-CM | POA: Diagnosis not present

## 2016-03-08 DIAGNOSIS — E785 Hyperlipidemia, unspecified: Secondary | ICD-10-CM | POA: Diagnosis not present

## 2016-03-08 DIAGNOSIS — E1069 Type 1 diabetes mellitus with other specified complication: Secondary | ICD-10-CM | POA: Diagnosis not present

## 2016-03-08 DIAGNOSIS — I519 Heart disease, unspecified: Secondary | ICD-10-CM | POA: Diagnosis not present

## 2016-03-08 DIAGNOSIS — Z87891 Personal history of nicotine dependence: Secondary | ICD-10-CM | POA: Diagnosis not present

## 2016-03-08 DIAGNOSIS — I340 Nonrheumatic mitral (valve) insufficiency: Secondary | ICD-10-CM | POA: Diagnosis not present

## 2016-03-08 DIAGNOSIS — I69320 Aphasia following cerebral infarction: Secondary | ICD-10-CM | POA: Diagnosis not present

## 2016-03-08 DIAGNOSIS — R414 Neurologic neglect syndrome: Secondary | ICD-10-CM | POA: Diagnosis present

## 2016-03-08 DIAGNOSIS — I429 Cardiomyopathy, unspecified: Secondary | ICD-10-CM | POA: Diagnosis not present

## 2016-03-08 DIAGNOSIS — G459 Transient cerebral ischemic attack, unspecified: Secondary | ICD-10-CM | POA: Diagnosis not present

## 2016-03-08 DIAGNOSIS — R402411 Glasgow coma scale score 13-15, in the field [EMT or ambulance]: Secondary | ICD-10-CM | POA: Diagnosis not present

## 2016-03-08 DIAGNOSIS — R000 Tachycardia, unspecified: Secondary | ICD-10-CM | POA: Diagnosis not present

## 2016-03-08 DIAGNOSIS — R404 Transient alteration of awareness: Secondary | ICD-10-CM | POA: Diagnosis not present

## 2016-03-08 DIAGNOSIS — Z7409 Other reduced mobility: Secondary | ICD-10-CM | POA: Diagnosis not present

## 2016-03-08 DIAGNOSIS — E119 Type 2 diabetes mellitus without complications: Secondary | ICD-10-CM | POA: Diagnosis not present

## 2016-03-08 DIAGNOSIS — Z7902 Long term (current) use of antithrombotics/antiplatelets: Secondary | ICD-10-CM | POA: Diagnosis not present

## 2016-03-08 DIAGNOSIS — R278 Other lack of coordination: Secondary | ICD-10-CM | POA: Diagnosis not present

## 2016-03-08 DIAGNOSIS — N189 Chronic kidney disease, unspecified: Secondary | ICD-10-CM | POA: Diagnosis not present

## 2016-03-08 DIAGNOSIS — I69398 Other sequelae of cerebral infarction: Secondary | ICD-10-CM | POA: Diagnosis not present

## 2016-03-08 DIAGNOSIS — R7989 Other specified abnormal findings of blood chemistry: Secondary | ICD-10-CM | POA: Diagnosis not present

## 2016-03-08 DIAGNOSIS — I255 Ischemic cardiomyopathy: Secondary | ICD-10-CM | POA: Diagnosis present

## 2016-03-08 DIAGNOSIS — R4182 Altered mental status, unspecified: Secondary | ICD-10-CM | POA: Diagnosis not present

## 2016-03-08 DIAGNOSIS — R279 Unspecified lack of coordination: Secondary | ICD-10-CM | POA: Diagnosis not present

## 2016-03-08 DIAGNOSIS — I214 Non-ST elevation (NSTEMI) myocardial infarction: Secondary | ICD-10-CM | POA: Diagnosis not present

## 2016-03-08 DIAGNOSIS — Z79899 Other long term (current) drug therapy: Secondary | ICD-10-CM | POA: Diagnosis not present

## 2016-03-08 DIAGNOSIS — G9389 Other specified disorders of brain: Secondary | ICD-10-CM | POA: Diagnosis not present

## 2016-03-08 DIAGNOSIS — R2689 Other abnormalities of gait and mobility: Secondary | ICD-10-CM | POA: Diagnosis not present

## 2016-03-08 DIAGNOSIS — Z6838 Body mass index (BMI) 38.0-38.9, adult: Secondary | ICD-10-CM | POA: Diagnosis not present

## 2016-03-08 DIAGNOSIS — E1165 Type 2 diabetes mellitus with hyperglycemia: Secondary | ICD-10-CM | POA: Diagnosis not present

## 2016-03-08 DIAGNOSIS — M6281 Muscle weakness (generalized): Secondary | ICD-10-CM | POA: Diagnosis not present

## 2016-03-09 DIAGNOSIS — I6930 Unspecified sequelae of cerebral infarction: Secondary | ICD-10-CM | POA: Insufficient documentation

## 2016-03-12 DIAGNOSIS — M6281 Muscle weakness (generalized): Secondary | ICD-10-CM | POA: Diagnosis not present

## 2016-03-12 DIAGNOSIS — I69822 Dysarthria following other cerebrovascular disease: Secondary | ICD-10-CM | POA: Diagnosis not present

## 2016-03-12 DIAGNOSIS — I4891 Unspecified atrial fibrillation: Secondary | ICD-10-CM | POA: Diagnosis not present

## 2016-03-12 DIAGNOSIS — R2689 Other abnormalities of gait and mobility: Secondary | ICD-10-CM | POA: Diagnosis not present

## 2016-03-12 DIAGNOSIS — I10 Essential (primary) hypertension: Secondary | ICD-10-CM | POA: Diagnosis not present

## 2016-03-12 DIAGNOSIS — R279 Unspecified lack of coordination: Secondary | ICD-10-CM | POA: Diagnosis not present

## 2016-03-12 DIAGNOSIS — R262 Difficulty in walking, not elsewhere classified: Secondary | ICD-10-CM | POA: Diagnosis not present

## 2016-03-12 DIAGNOSIS — N189 Chronic kidney disease, unspecified: Secondary | ICD-10-CM | POA: Diagnosis not present

## 2016-03-12 DIAGNOSIS — I69891 Dysphagia following other cerebrovascular disease: Secondary | ICD-10-CM | POA: Diagnosis not present

## 2016-03-12 DIAGNOSIS — E785 Hyperlipidemia, unspecified: Secondary | ICD-10-CM | POA: Diagnosis not present

## 2016-03-12 DIAGNOSIS — E1069 Type 1 diabetes mellitus with other specified complication: Secondary | ICD-10-CM | POA: Diagnosis not present

## 2016-03-12 DIAGNOSIS — R7989 Other specified abnormal findings of blood chemistry: Secondary | ICD-10-CM | POA: Diagnosis not present

## 2016-03-12 DIAGNOSIS — Z9114 Patient's other noncompliance with medication regimen: Secondary | ICD-10-CM | POA: Diagnosis not present

## 2016-03-12 DIAGNOSIS — Z7409 Other reduced mobility: Secondary | ICD-10-CM | POA: Diagnosis not present

## 2016-03-12 DIAGNOSIS — R4701 Aphasia: Secondary | ICD-10-CM | POA: Diagnosis not present

## 2016-03-12 DIAGNOSIS — R404 Transient alteration of awareness: Secondary | ICD-10-CM | POA: Diagnosis not present

## 2016-03-12 DIAGNOSIS — R278 Other lack of coordination: Secondary | ICD-10-CM | POA: Diagnosis not present

## 2016-03-12 DIAGNOSIS — R748 Abnormal levels of other serum enzymes: Secondary | ICD-10-CM | POA: Diagnosis not present

## 2016-03-12 DIAGNOSIS — I429 Cardiomyopathy, unspecified: Secondary | ICD-10-CM | POA: Diagnosis not present

## 2016-03-12 DIAGNOSIS — I639 Cerebral infarction, unspecified: Secondary | ICD-10-CM | POA: Diagnosis not present

## 2016-03-24 DIAGNOSIS — I4891 Unspecified atrial fibrillation: Secondary | ICD-10-CM | POA: Diagnosis not present

## 2016-04-02 DIAGNOSIS — Z87891 Personal history of nicotine dependence: Secondary | ICD-10-CM | POA: Diagnosis not present

## 2016-04-02 DIAGNOSIS — Z299 Encounter for prophylactic measures, unspecified: Secondary | ICD-10-CM | POA: Diagnosis not present

## 2016-04-02 DIAGNOSIS — N181 Chronic kidney disease, stage 1: Secondary | ICD-10-CM | POA: Diagnosis not present

## 2016-04-02 DIAGNOSIS — E1122 Type 2 diabetes mellitus with diabetic chronic kidney disease: Secondary | ICD-10-CM | POA: Diagnosis not present

## 2016-04-02 DIAGNOSIS — Z6832 Body mass index (BMI) 32.0-32.9, adult: Secondary | ICD-10-CM | POA: Diagnosis not present

## 2016-04-02 DIAGNOSIS — E7800 Pure hypercholesterolemia, unspecified: Secondary | ICD-10-CM | POA: Diagnosis not present

## 2016-04-02 DIAGNOSIS — Z713 Dietary counseling and surveillance: Secondary | ICD-10-CM | POA: Diagnosis not present

## 2016-04-02 DIAGNOSIS — I10 Essential (primary) hypertension: Secondary | ICD-10-CM | POA: Diagnosis not present

## 2016-04-03 DIAGNOSIS — M6281 Muscle weakness (generalized): Secondary | ICD-10-CM | POA: Diagnosis not present

## 2016-04-03 DIAGNOSIS — E669 Obesity, unspecified: Secondary | ICD-10-CM | POA: Diagnosis not present

## 2016-04-03 DIAGNOSIS — Z794 Long term (current) use of insulin: Secondary | ICD-10-CM | POA: Diagnosis not present

## 2016-04-03 DIAGNOSIS — E785 Hyperlipidemia, unspecified: Secondary | ICD-10-CM | POA: Diagnosis not present

## 2016-04-03 DIAGNOSIS — Z87891 Personal history of nicotine dependence: Secondary | ICD-10-CM | POA: Diagnosis not present

## 2016-04-03 DIAGNOSIS — I10 Essential (primary) hypertension: Secondary | ICD-10-CM | POA: Diagnosis not present

## 2016-04-03 DIAGNOSIS — R4701 Aphasia: Secondary | ICD-10-CM | POA: Diagnosis not present

## 2016-04-03 DIAGNOSIS — I255 Ischemic cardiomyopathy: Secondary | ICD-10-CM | POA: Diagnosis not present

## 2016-04-03 DIAGNOSIS — I69398 Other sequelae of cerebral infarction: Secondary | ICD-10-CM | POA: Diagnosis not present

## 2016-04-03 DIAGNOSIS — E1165 Type 2 diabetes mellitus with hyperglycemia: Secondary | ICD-10-CM | POA: Diagnosis not present

## 2016-04-06 DIAGNOSIS — M6281 Muscle weakness (generalized): Secondary | ICD-10-CM | POA: Diagnosis not present

## 2016-04-06 DIAGNOSIS — I10 Essential (primary) hypertension: Secondary | ICD-10-CM | POA: Diagnosis not present

## 2016-04-06 DIAGNOSIS — I69398 Other sequelae of cerebral infarction: Secondary | ICD-10-CM | POA: Diagnosis not present

## 2016-04-06 DIAGNOSIS — R4701 Aphasia: Secondary | ICD-10-CM | POA: Diagnosis not present

## 2016-04-06 DIAGNOSIS — I255 Ischemic cardiomyopathy: Secondary | ICD-10-CM | POA: Diagnosis not present

## 2016-04-06 DIAGNOSIS — E1165 Type 2 diabetes mellitus with hyperglycemia: Secondary | ICD-10-CM | POA: Diagnosis not present

## 2016-04-07 DIAGNOSIS — I10 Essential (primary) hypertension: Secondary | ICD-10-CM | POA: Diagnosis not present

## 2016-04-07 DIAGNOSIS — I69398 Other sequelae of cerebral infarction: Secondary | ICD-10-CM | POA: Diagnosis not present

## 2016-04-07 DIAGNOSIS — M6281 Muscle weakness (generalized): Secondary | ICD-10-CM | POA: Diagnosis not present

## 2016-04-07 DIAGNOSIS — R4701 Aphasia: Secondary | ICD-10-CM | POA: Diagnosis not present

## 2016-04-07 DIAGNOSIS — I255 Ischemic cardiomyopathy: Secondary | ICD-10-CM | POA: Diagnosis not present

## 2016-04-07 DIAGNOSIS — E1165 Type 2 diabetes mellitus with hyperglycemia: Secondary | ICD-10-CM | POA: Diagnosis not present

## 2016-04-08 DIAGNOSIS — I255 Ischemic cardiomyopathy: Secondary | ICD-10-CM | POA: Diagnosis not present

## 2016-04-08 DIAGNOSIS — I10 Essential (primary) hypertension: Secondary | ICD-10-CM | POA: Diagnosis not present

## 2016-04-08 DIAGNOSIS — E1165 Type 2 diabetes mellitus with hyperglycemia: Secondary | ICD-10-CM | POA: Diagnosis not present

## 2016-04-08 DIAGNOSIS — R4701 Aphasia: Secondary | ICD-10-CM | POA: Diagnosis not present

## 2016-04-08 DIAGNOSIS — I69398 Other sequelae of cerebral infarction: Secondary | ICD-10-CM | POA: Diagnosis not present

## 2016-04-08 DIAGNOSIS — M6281 Muscle weakness (generalized): Secondary | ICD-10-CM | POA: Diagnosis not present

## 2016-04-09 DIAGNOSIS — M6281 Muscle weakness (generalized): Secondary | ICD-10-CM | POA: Diagnosis not present

## 2016-04-09 DIAGNOSIS — E1165 Type 2 diabetes mellitus with hyperglycemia: Secondary | ICD-10-CM | POA: Diagnosis not present

## 2016-04-09 DIAGNOSIS — I69398 Other sequelae of cerebral infarction: Secondary | ICD-10-CM | POA: Diagnosis not present

## 2016-04-09 DIAGNOSIS — I255 Ischemic cardiomyopathy: Secondary | ICD-10-CM | POA: Diagnosis not present

## 2016-04-09 DIAGNOSIS — R4701 Aphasia: Secondary | ICD-10-CM | POA: Diagnosis not present

## 2016-04-09 DIAGNOSIS — I10 Essential (primary) hypertension: Secondary | ICD-10-CM | POA: Diagnosis not present

## 2016-04-12 DIAGNOSIS — E1165 Type 2 diabetes mellitus with hyperglycemia: Secondary | ICD-10-CM | POA: Diagnosis not present

## 2016-04-12 DIAGNOSIS — I255 Ischemic cardiomyopathy: Secondary | ICD-10-CM | POA: Diagnosis not present

## 2016-04-12 DIAGNOSIS — I10 Essential (primary) hypertension: Secondary | ICD-10-CM | POA: Diagnosis not present

## 2016-04-12 DIAGNOSIS — M6281 Muscle weakness (generalized): Secondary | ICD-10-CM | POA: Diagnosis not present

## 2016-04-12 DIAGNOSIS — I69398 Other sequelae of cerebral infarction: Secondary | ICD-10-CM | POA: Diagnosis not present

## 2016-04-12 DIAGNOSIS — R4701 Aphasia: Secondary | ICD-10-CM | POA: Diagnosis not present

## 2016-04-13 DIAGNOSIS — M6281 Muscle weakness (generalized): Secondary | ICD-10-CM | POA: Diagnosis not present

## 2016-04-13 DIAGNOSIS — I255 Ischemic cardiomyopathy: Secondary | ICD-10-CM | POA: Diagnosis not present

## 2016-04-13 DIAGNOSIS — E1165 Type 2 diabetes mellitus with hyperglycemia: Secondary | ICD-10-CM | POA: Diagnosis not present

## 2016-04-13 DIAGNOSIS — R4701 Aphasia: Secondary | ICD-10-CM | POA: Diagnosis not present

## 2016-04-13 DIAGNOSIS — I69398 Other sequelae of cerebral infarction: Secondary | ICD-10-CM | POA: Diagnosis not present

## 2016-04-13 DIAGNOSIS — I10 Essential (primary) hypertension: Secondary | ICD-10-CM | POA: Diagnosis not present

## 2016-04-14 DIAGNOSIS — M6281 Muscle weakness (generalized): Secondary | ICD-10-CM | POA: Diagnosis not present

## 2016-04-14 DIAGNOSIS — I10 Essential (primary) hypertension: Secondary | ICD-10-CM | POA: Diagnosis not present

## 2016-04-14 DIAGNOSIS — I255 Ischemic cardiomyopathy: Secondary | ICD-10-CM | POA: Diagnosis not present

## 2016-04-14 DIAGNOSIS — R4701 Aphasia: Secondary | ICD-10-CM | POA: Diagnosis not present

## 2016-04-14 DIAGNOSIS — I69398 Other sequelae of cerebral infarction: Secondary | ICD-10-CM | POA: Diagnosis not present

## 2016-04-14 DIAGNOSIS — E1165 Type 2 diabetes mellitus with hyperglycemia: Secondary | ICD-10-CM | POA: Diagnosis not present

## 2016-04-15 DIAGNOSIS — I69398 Other sequelae of cerebral infarction: Secondary | ICD-10-CM | POA: Diagnosis not present

## 2016-04-15 DIAGNOSIS — R4701 Aphasia: Secondary | ICD-10-CM | POA: Diagnosis not present

## 2016-04-15 DIAGNOSIS — M6281 Muscle weakness (generalized): Secondary | ICD-10-CM | POA: Diagnosis not present

## 2016-04-15 DIAGNOSIS — I10 Essential (primary) hypertension: Secondary | ICD-10-CM | POA: Diagnosis not present

## 2016-04-15 DIAGNOSIS — I255 Ischemic cardiomyopathy: Secondary | ICD-10-CM | POA: Diagnosis not present

## 2016-04-15 DIAGNOSIS — E1165 Type 2 diabetes mellitus with hyperglycemia: Secondary | ICD-10-CM | POA: Diagnosis not present

## 2016-04-16 DIAGNOSIS — E1165 Type 2 diabetes mellitus with hyperglycemia: Secondary | ICD-10-CM | POA: Diagnosis not present

## 2016-04-16 DIAGNOSIS — M6281 Muscle weakness (generalized): Secondary | ICD-10-CM | POA: Diagnosis not present

## 2016-04-16 DIAGNOSIS — R4701 Aphasia: Secondary | ICD-10-CM | POA: Diagnosis not present

## 2016-04-16 DIAGNOSIS — I10 Essential (primary) hypertension: Secondary | ICD-10-CM | POA: Diagnosis not present

## 2016-04-16 DIAGNOSIS — I69398 Other sequelae of cerebral infarction: Secondary | ICD-10-CM | POA: Diagnosis not present

## 2016-04-16 DIAGNOSIS — I255 Ischemic cardiomyopathy: Secondary | ICD-10-CM | POA: Diagnosis not present

## 2016-04-19 DIAGNOSIS — I10 Essential (primary) hypertension: Secondary | ICD-10-CM | POA: Diagnosis not present

## 2016-04-19 DIAGNOSIS — M6281 Muscle weakness (generalized): Secondary | ICD-10-CM | POA: Diagnosis not present

## 2016-04-19 DIAGNOSIS — I255 Ischemic cardiomyopathy: Secondary | ICD-10-CM | POA: Diagnosis not present

## 2016-04-19 DIAGNOSIS — R4701 Aphasia: Secondary | ICD-10-CM | POA: Diagnosis not present

## 2016-04-19 DIAGNOSIS — I69398 Other sequelae of cerebral infarction: Secondary | ICD-10-CM | POA: Diagnosis not present

## 2016-04-19 DIAGNOSIS — E1165 Type 2 diabetes mellitus with hyperglycemia: Secondary | ICD-10-CM | POA: Diagnosis not present

## 2016-04-20 DIAGNOSIS — I69398 Other sequelae of cerebral infarction: Secondary | ICD-10-CM | POA: Diagnosis not present

## 2016-04-20 DIAGNOSIS — M6281 Muscle weakness (generalized): Secondary | ICD-10-CM | POA: Diagnosis not present

## 2016-04-20 DIAGNOSIS — I10 Essential (primary) hypertension: Secondary | ICD-10-CM | POA: Diagnosis not present

## 2016-04-20 DIAGNOSIS — E1165 Type 2 diabetes mellitus with hyperglycemia: Secondary | ICD-10-CM | POA: Diagnosis not present

## 2016-04-20 DIAGNOSIS — R4701 Aphasia: Secondary | ICD-10-CM | POA: Diagnosis not present

## 2016-04-20 DIAGNOSIS — I255 Ischemic cardiomyopathy: Secondary | ICD-10-CM | POA: Diagnosis not present

## 2016-04-21 DIAGNOSIS — E1122 Type 2 diabetes mellitus with diabetic chronic kidney disease: Secondary | ICD-10-CM | POA: Diagnosis not present

## 2016-04-21 DIAGNOSIS — I10 Essential (primary) hypertension: Secondary | ICD-10-CM | POA: Diagnosis not present

## 2016-04-21 DIAGNOSIS — N181 Chronic kidney disease, stage 1: Secondary | ICD-10-CM | POA: Diagnosis not present

## 2016-04-21 DIAGNOSIS — Z6832 Body mass index (BMI) 32.0-32.9, adult: Secondary | ICD-10-CM | POA: Diagnosis not present

## 2016-04-21 DIAGNOSIS — Z299 Encounter for prophylactic measures, unspecified: Secondary | ICD-10-CM | POA: Diagnosis not present

## 2016-04-21 DIAGNOSIS — M6281 Muscle weakness (generalized): Secondary | ICD-10-CM | POA: Diagnosis not present

## 2016-04-21 DIAGNOSIS — B353 Tinea pedis: Secondary | ICD-10-CM | POA: Diagnosis not present

## 2016-04-21 DIAGNOSIS — R4701 Aphasia: Secondary | ICD-10-CM | POA: Diagnosis not present

## 2016-04-21 DIAGNOSIS — I619 Nontraumatic intracerebral hemorrhage, unspecified: Secondary | ICD-10-CM | POA: Diagnosis not present

## 2016-04-21 DIAGNOSIS — E669 Obesity, unspecified: Secondary | ICD-10-CM | POA: Diagnosis not present

## 2016-04-21 DIAGNOSIS — Z713 Dietary counseling and surveillance: Secondary | ICD-10-CM | POA: Diagnosis not present

## 2016-04-21 DIAGNOSIS — L97529 Non-pressure chronic ulcer of other part of left foot with unspecified severity: Secondary | ICD-10-CM | POA: Diagnosis not present

## 2016-04-21 DIAGNOSIS — E1165 Type 2 diabetes mellitus with hyperglycemia: Secondary | ICD-10-CM | POA: Diagnosis not present

## 2016-04-21 DIAGNOSIS — E7800 Pure hypercholesterolemia, unspecified: Secondary | ICD-10-CM | POA: Diagnosis not present

## 2016-04-21 DIAGNOSIS — I69398 Other sequelae of cerebral infarction: Secondary | ICD-10-CM | POA: Diagnosis not present

## 2016-04-21 DIAGNOSIS — I255 Ischemic cardiomyopathy: Secondary | ICD-10-CM | POA: Diagnosis not present

## 2016-04-22 DIAGNOSIS — R4701 Aphasia: Secondary | ICD-10-CM | POA: Diagnosis not present

## 2016-04-22 DIAGNOSIS — I255 Ischemic cardiomyopathy: Secondary | ICD-10-CM | POA: Diagnosis not present

## 2016-04-22 DIAGNOSIS — I10 Essential (primary) hypertension: Secondary | ICD-10-CM | POA: Diagnosis not present

## 2016-04-22 DIAGNOSIS — E1165 Type 2 diabetes mellitus with hyperglycemia: Secondary | ICD-10-CM | POA: Diagnosis not present

## 2016-04-22 DIAGNOSIS — M6281 Muscle weakness (generalized): Secondary | ICD-10-CM | POA: Diagnosis not present

## 2016-04-22 DIAGNOSIS — I69398 Other sequelae of cerebral infarction: Secondary | ICD-10-CM | POA: Diagnosis not present

## 2016-04-26 DIAGNOSIS — M6281 Muscle weakness (generalized): Secondary | ICD-10-CM | POA: Diagnosis not present

## 2016-04-26 DIAGNOSIS — I255 Ischemic cardiomyopathy: Secondary | ICD-10-CM | POA: Diagnosis not present

## 2016-04-26 DIAGNOSIS — I69398 Other sequelae of cerebral infarction: Secondary | ICD-10-CM | POA: Diagnosis not present

## 2016-04-26 DIAGNOSIS — I10 Essential (primary) hypertension: Secondary | ICD-10-CM | POA: Diagnosis not present

## 2016-04-26 DIAGNOSIS — R4701 Aphasia: Secondary | ICD-10-CM | POA: Diagnosis not present

## 2016-04-26 DIAGNOSIS — E1165 Type 2 diabetes mellitus with hyperglycemia: Secondary | ICD-10-CM | POA: Diagnosis not present

## 2016-04-28 DIAGNOSIS — R410 Disorientation, unspecified: Secondary | ICD-10-CM | POA: Diagnosis not present

## 2016-04-28 DIAGNOSIS — I10 Essential (primary) hypertension: Secondary | ICD-10-CM | POA: Diagnosis not present

## 2016-04-28 DIAGNOSIS — R531 Weakness: Secondary | ICD-10-CM | POA: Diagnosis not present

## 2016-04-28 DIAGNOSIS — R4182 Altered mental status, unspecified: Secondary | ICD-10-CM | POA: Diagnosis not present

## 2016-04-28 DIAGNOSIS — E119 Type 2 diabetes mellitus without complications: Secondary | ICD-10-CM | POA: Diagnosis not present

## 2016-04-28 DIAGNOSIS — Z7902 Long term (current) use of antithrombotics/antiplatelets: Secondary | ICD-10-CM | POA: Diagnosis not present

## 2016-04-28 DIAGNOSIS — Z79899 Other long term (current) drug therapy: Secondary | ICD-10-CM | POA: Diagnosis not present

## 2016-04-28 DIAGNOSIS — F4489 Other dissociative and conversion disorders: Secondary | ICD-10-CM | POA: Diagnosis not present

## 2016-04-28 DIAGNOSIS — I6789 Other cerebrovascular disease: Secondary | ICD-10-CM | POA: Diagnosis not present

## 2016-04-28 DIAGNOSIS — Z794 Long term (current) use of insulin: Secondary | ICD-10-CM | POA: Diagnosis not present

## 2016-04-28 DIAGNOSIS — H53462 Homonymous bilateral field defects, left side: Secondary | ICD-10-CM | POA: Diagnosis not present

## 2016-04-28 DIAGNOSIS — I69312 Visuospatial deficit and spatial neglect following cerebral infarction: Secondary | ICD-10-CM | POA: Diagnosis not present

## 2016-04-29 DIAGNOSIS — E785 Hyperlipidemia, unspecified: Secondary | ICD-10-CM | POA: Diagnosis not present

## 2016-04-29 DIAGNOSIS — E119 Type 2 diabetes mellitus without complications: Secondary | ICD-10-CM | POA: Diagnosis not present

## 2016-04-29 DIAGNOSIS — R4182 Altered mental status, unspecified: Secondary | ICD-10-CM | POA: Diagnosis not present

## 2016-04-29 DIAGNOSIS — I10 Essential (primary) hypertension: Secondary | ICD-10-CM | POA: Diagnosis not present

## 2016-04-29 DIAGNOSIS — H53462 Homonymous bilateral field defects, left side: Secondary | ICD-10-CM | POA: Diagnosis not present

## 2016-04-30 DIAGNOSIS — R4182 Altered mental status, unspecified: Secondary | ICD-10-CM | POA: Diagnosis not present

## 2016-04-30 DIAGNOSIS — I10 Essential (primary) hypertension: Secondary | ICD-10-CM | POA: Diagnosis not present

## 2016-04-30 DIAGNOSIS — E119 Type 2 diabetes mellitus without complications: Secondary | ICD-10-CM | POA: Diagnosis not present

## 2016-04-30 DIAGNOSIS — H53462 Homonymous bilateral field defects, left side: Secondary | ICD-10-CM | POA: Diagnosis not present

## 2016-05-03 DIAGNOSIS — L97901 Non-pressure chronic ulcer of unspecified part of unspecified lower leg limited to breakdown of skin: Secondary | ICD-10-CM | POA: Diagnosis not present

## 2016-05-04 DIAGNOSIS — E1122 Type 2 diabetes mellitus with diabetic chronic kidney disease: Secondary | ICD-10-CM | POA: Diagnosis not present

## 2016-05-04 DIAGNOSIS — F419 Anxiety disorder, unspecified: Secondary | ICD-10-CM | POA: Diagnosis not present

## 2016-05-04 DIAGNOSIS — N181 Chronic kidney disease, stage 1: Secondary | ICD-10-CM | POA: Diagnosis not present

## 2016-05-04 DIAGNOSIS — Z299 Encounter for prophylactic measures, unspecified: Secondary | ICD-10-CM | POA: Diagnosis not present

## 2016-05-04 DIAGNOSIS — E7800 Pure hypercholesterolemia, unspecified: Secondary | ICD-10-CM | POA: Diagnosis not present

## 2016-05-04 DIAGNOSIS — Z6830 Body mass index (BMI) 30.0-30.9, adult: Secondary | ICD-10-CM | POA: Diagnosis not present

## 2016-05-04 DIAGNOSIS — I10 Essential (primary) hypertension: Secondary | ICD-10-CM | POA: Diagnosis not present

## 2016-05-05 DIAGNOSIS — R4701 Aphasia: Secondary | ICD-10-CM | POA: Diagnosis not present

## 2016-05-05 DIAGNOSIS — I10 Essential (primary) hypertension: Secondary | ICD-10-CM | POA: Diagnosis not present

## 2016-05-05 DIAGNOSIS — M6281 Muscle weakness (generalized): Secondary | ICD-10-CM | POA: Diagnosis not present

## 2016-05-05 DIAGNOSIS — E1165 Type 2 diabetes mellitus with hyperglycemia: Secondary | ICD-10-CM | POA: Diagnosis not present

## 2016-05-05 DIAGNOSIS — I255 Ischemic cardiomyopathy: Secondary | ICD-10-CM | POA: Diagnosis not present

## 2016-05-05 DIAGNOSIS — I69398 Other sequelae of cerebral infarction: Secondary | ICD-10-CM | POA: Diagnosis not present

## 2016-05-06 DIAGNOSIS — R4701 Aphasia: Secondary | ICD-10-CM | POA: Diagnosis not present

## 2016-05-06 DIAGNOSIS — I69398 Other sequelae of cerebral infarction: Secondary | ICD-10-CM | POA: Diagnosis not present

## 2016-05-06 DIAGNOSIS — I255 Ischemic cardiomyopathy: Secondary | ICD-10-CM | POA: Diagnosis not present

## 2016-05-06 DIAGNOSIS — L97529 Non-pressure chronic ulcer of other part of left foot with unspecified severity: Secondary | ICD-10-CM | POA: Diagnosis not present

## 2016-05-06 DIAGNOSIS — E1165 Type 2 diabetes mellitus with hyperglycemia: Secondary | ICD-10-CM | POA: Diagnosis not present

## 2016-05-06 DIAGNOSIS — M6281 Muscle weakness (generalized): Secondary | ICD-10-CM | POA: Diagnosis not present

## 2016-05-06 DIAGNOSIS — E1140 Type 2 diabetes mellitus with diabetic neuropathy, unspecified: Secondary | ICD-10-CM | POA: Diagnosis not present

## 2016-05-06 DIAGNOSIS — E1142 Type 2 diabetes mellitus with diabetic polyneuropathy: Secondary | ICD-10-CM | POA: Diagnosis not present

## 2016-05-06 DIAGNOSIS — I10 Essential (primary) hypertension: Secondary | ICD-10-CM | POA: Diagnosis not present

## 2016-05-07 DIAGNOSIS — M6281 Muscle weakness (generalized): Secondary | ICD-10-CM | POA: Diagnosis not present

## 2016-05-07 DIAGNOSIS — I69398 Other sequelae of cerebral infarction: Secondary | ICD-10-CM | POA: Diagnosis not present

## 2016-05-07 DIAGNOSIS — I10 Essential (primary) hypertension: Secondary | ICD-10-CM | POA: Diagnosis not present

## 2016-05-07 DIAGNOSIS — E1165 Type 2 diabetes mellitus with hyperglycemia: Secondary | ICD-10-CM | POA: Diagnosis not present

## 2016-05-07 DIAGNOSIS — R4701 Aphasia: Secondary | ICD-10-CM | POA: Diagnosis not present

## 2016-05-07 DIAGNOSIS — I255 Ischemic cardiomyopathy: Secondary | ICD-10-CM | POA: Diagnosis not present

## 2016-05-10 DIAGNOSIS — I10 Essential (primary) hypertension: Secondary | ICD-10-CM | POA: Diagnosis not present

## 2016-05-10 DIAGNOSIS — R4701 Aphasia: Secondary | ICD-10-CM | POA: Diagnosis not present

## 2016-05-10 DIAGNOSIS — I255 Ischemic cardiomyopathy: Secondary | ICD-10-CM | POA: Diagnosis not present

## 2016-05-10 DIAGNOSIS — I69398 Other sequelae of cerebral infarction: Secondary | ICD-10-CM | POA: Diagnosis not present

## 2016-05-10 DIAGNOSIS — E1165 Type 2 diabetes mellitus with hyperglycemia: Secondary | ICD-10-CM | POA: Diagnosis not present

## 2016-05-10 DIAGNOSIS — M6281 Muscle weakness (generalized): Secondary | ICD-10-CM | POA: Diagnosis not present

## 2016-05-12 DIAGNOSIS — E1165 Type 2 diabetes mellitus with hyperglycemia: Secondary | ICD-10-CM | POA: Diagnosis not present

## 2016-05-12 DIAGNOSIS — I10 Essential (primary) hypertension: Secondary | ICD-10-CM | POA: Diagnosis not present

## 2016-05-12 DIAGNOSIS — I255 Ischemic cardiomyopathy: Secondary | ICD-10-CM | POA: Diagnosis not present

## 2016-05-12 DIAGNOSIS — M6281 Muscle weakness (generalized): Secondary | ICD-10-CM | POA: Diagnosis not present

## 2016-05-12 DIAGNOSIS — R4701 Aphasia: Secondary | ICD-10-CM | POA: Diagnosis not present

## 2016-05-12 DIAGNOSIS — I69398 Other sequelae of cerebral infarction: Secondary | ICD-10-CM | POA: Diagnosis not present

## 2016-05-13 DIAGNOSIS — R4701 Aphasia: Secondary | ICD-10-CM | POA: Diagnosis not present

## 2016-05-13 DIAGNOSIS — I69398 Other sequelae of cerebral infarction: Secondary | ICD-10-CM | POA: Diagnosis not present

## 2016-05-13 DIAGNOSIS — E1165 Type 2 diabetes mellitus with hyperglycemia: Secondary | ICD-10-CM | POA: Diagnosis not present

## 2016-05-13 DIAGNOSIS — M6281 Muscle weakness (generalized): Secondary | ICD-10-CM | POA: Diagnosis not present

## 2016-05-13 DIAGNOSIS — I10 Essential (primary) hypertension: Secondary | ICD-10-CM | POA: Diagnosis not present

## 2016-05-13 DIAGNOSIS — I255 Ischemic cardiomyopathy: Secondary | ICD-10-CM | POA: Diagnosis not present

## 2016-05-14 DIAGNOSIS — M6281 Muscle weakness (generalized): Secondary | ICD-10-CM | POA: Diagnosis not present

## 2016-05-14 DIAGNOSIS — I69398 Other sequelae of cerebral infarction: Secondary | ICD-10-CM | POA: Diagnosis not present

## 2016-05-14 DIAGNOSIS — I255 Ischemic cardiomyopathy: Secondary | ICD-10-CM | POA: Diagnosis not present

## 2016-05-14 DIAGNOSIS — I10 Essential (primary) hypertension: Secondary | ICD-10-CM | POA: Diagnosis not present

## 2016-05-14 DIAGNOSIS — R4701 Aphasia: Secondary | ICD-10-CM | POA: Diagnosis not present

## 2016-05-14 DIAGNOSIS — E1165 Type 2 diabetes mellitus with hyperglycemia: Secondary | ICD-10-CM | POA: Diagnosis not present

## 2016-05-17 DIAGNOSIS — I69398 Other sequelae of cerebral infarction: Secondary | ICD-10-CM | POA: Diagnosis not present

## 2016-05-17 DIAGNOSIS — I10 Essential (primary) hypertension: Secondary | ICD-10-CM | POA: Diagnosis not present

## 2016-05-17 DIAGNOSIS — E1165 Type 2 diabetes mellitus with hyperglycemia: Secondary | ICD-10-CM | POA: Diagnosis not present

## 2016-05-17 DIAGNOSIS — M6281 Muscle weakness (generalized): Secondary | ICD-10-CM | POA: Diagnosis not present

## 2016-05-17 DIAGNOSIS — R4701 Aphasia: Secondary | ICD-10-CM | POA: Diagnosis not present

## 2016-05-17 DIAGNOSIS — I255 Ischemic cardiomyopathy: Secondary | ICD-10-CM | POA: Diagnosis not present

## 2016-05-19 DIAGNOSIS — E1165 Type 2 diabetes mellitus with hyperglycemia: Secondary | ICD-10-CM | POA: Diagnosis not present

## 2016-05-19 DIAGNOSIS — R4701 Aphasia: Secondary | ICD-10-CM | POA: Diagnosis not present

## 2016-05-19 DIAGNOSIS — I10 Essential (primary) hypertension: Secondary | ICD-10-CM | POA: Diagnosis not present

## 2016-05-19 DIAGNOSIS — I255 Ischemic cardiomyopathy: Secondary | ICD-10-CM | POA: Diagnosis not present

## 2016-05-19 DIAGNOSIS — M6281 Muscle weakness (generalized): Secondary | ICD-10-CM | POA: Diagnosis not present

## 2016-05-19 DIAGNOSIS — I69398 Other sequelae of cerebral infarction: Secondary | ICD-10-CM | POA: Diagnosis not present

## 2016-05-20 DIAGNOSIS — I10 Essential (primary) hypertension: Secondary | ICD-10-CM | POA: Diagnosis not present

## 2016-05-20 DIAGNOSIS — I69398 Other sequelae of cerebral infarction: Secondary | ICD-10-CM | POA: Diagnosis not present

## 2016-05-20 DIAGNOSIS — M6281 Muscle weakness (generalized): Secondary | ICD-10-CM | POA: Diagnosis not present

## 2016-05-20 DIAGNOSIS — I255 Ischemic cardiomyopathy: Secondary | ICD-10-CM | POA: Diagnosis not present

## 2016-05-20 DIAGNOSIS — E1165 Type 2 diabetes mellitus with hyperglycemia: Secondary | ICD-10-CM | POA: Diagnosis not present

## 2016-05-20 DIAGNOSIS — R4701 Aphasia: Secondary | ICD-10-CM | POA: Diagnosis not present

## 2016-05-24 ENCOUNTER — Other Ambulatory Visit (HOSPITAL_COMMUNITY): Payer: Self-pay | Admitting: Podiatry

## 2016-05-24 ENCOUNTER — Other Ambulatory Visit: Payer: Self-pay | Admitting: Podiatry

## 2016-05-24 DIAGNOSIS — I10 Essential (primary) hypertension: Secondary | ICD-10-CM | POA: Diagnosis not present

## 2016-05-24 DIAGNOSIS — L97529 Non-pressure chronic ulcer of other part of left foot with unspecified severity: Secondary | ICD-10-CM

## 2016-05-24 DIAGNOSIS — E1165 Type 2 diabetes mellitus with hyperglycemia: Secondary | ICD-10-CM | POA: Diagnosis not present

## 2016-05-24 DIAGNOSIS — R4701 Aphasia: Secondary | ICD-10-CM | POA: Diagnosis not present

## 2016-05-24 DIAGNOSIS — L600 Ingrowing nail: Secondary | ICD-10-CM | POA: Diagnosis not present

## 2016-05-24 DIAGNOSIS — M6281 Muscle weakness (generalized): Secondary | ICD-10-CM | POA: Diagnosis not present

## 2016-05-24 DIAGNOSIS — L0390 Cellulitis, unspecified: Secondary | ICD-10-CM | POA: Diagnosis not present

## 2016-05-24 DIAGNOSIS — I255 Ischemic cardiomyopathy: Secondary | ICD-10-CM | POA: Diagnosis not present

## 2016-05-24 DIAGNOSIS — I69398 Other sequelae of cerebral infarction: Secondary | ICD-10-CM | POA: Diagnosis not present

## 2016-05-25 DIAGNOSIS — Z6830 Body mass index (BMI) 30.0-30.9, adult: Secondary | ICD-10-CM | POA: Diagnosis not present

## 2016-05-25 DIAGNOSIS — I10 Essential (primary) hypertension: Secondary | ICD-10-CM | POA: Diagnosis not present

## 2016-05-25 DIAGNOSIS — Z299 Encounter for prophylactic measures, unspecified: Secondary | ICD-10-CM | POA: Diagnosis not present

## 2016-05-25 DIAGNOSIS — I739 Peripheral vascular disease, unspecified: Secondary | ICD-10-CM | POA: Diagnosis not present

## 2016-05-25 DIAGNOSIS — N181 Chronic kidney disease, stage 1: Secondary | ICD-10-CM | POA: Diagnosis not present

## 2016-05-25 DIAGNOSIS — E7800 Pure hypercholesterolemia, unspecified: Secondary | ICD-10-CM | POA: Diagnosis not present

## 2016-05-25 DIAGNOSIS — E1122 Type 2 diabetes mellitus with diabetic chronic kidney disease: Secondary | ICD-10-CM | POA: Diagnosis not present

## 2016-05-26 DIAGNOSIS — R4701 Aphasia: Secondary | ICD-10-CM | POA: Diagnosis not present

## 2016-05-26 DIAGNOSIS — I255 Ischemic cardiomyopathy: Secondary | ICD-10-CM | POA: Diagnosis not present

## 2016-05-26 DIAGNOSIS — I10 Essential (primary) hypertension: Secondary | ICD-10-CM | POA: Diagnosis not present

## 2016-05-26 DIAGNOSIS — I69398 Other sequelae of cerebral infarction: Secondary | ICD-10-CM | POA: Diagnosis not present

## 2016-05-26 DIAGNOSIS — E1165 Type 2 diabetes mellitus with hyperglycemia: Secondary | ICD-10-CM | POA: Diagnosis not present

## 2016-05-26 DIAGNOSIS — M6281 Muscle weakness (generalized): Secondary | ICD-10-CM | POA: Diagnosis not present

## 2016-05-27 DIAGNOSIS — E119 Type 2 diabetes mellitus without complications: Secondary | ICD-10-CM | POA: Diagnosis not present

## 2016-05-27 DIAGNOSIS — I639 Cerebral infarction, unspecified: Secondary | ICD-10-CM | POA: Diagnosis not present

## 2016-05-27 DIAGNOSIS — I10 Essential (primary) hypertension: Secondary | ICD-10-CM | POA: Diagnosis not present

## 2016-05-28 ENCOUNTER — Encounter (HOSPITAL_COMMUNITY): Payer: Self-pay

## 2016-05-28 ENCOUNTER — Ambulatory Visit (HOSPITAL_COMMUNITY)
Admission: RE | Admit: 2016-05-28 | Discharge: 2016-05-28 | Disposition: A | Discharge status: Home / Self Care | Payer: Medicare Other | Source: Ambulatory Visit | Attending: Podiatry | Admitting: Podiatry

## 2016-05-28 DIAGNOSIS — I255 Ischemic cardiomyopathy: Secondary | ICD-10-CM | POA: Diagnosis not present

## 2016-05-28 DIAGNOSIS — M6281 Muscle weakness (generalized): Secondary | ICD-10-CM | POA: Diagnosis not present

## 2016-05-28 DIAGNOSIS — E1165 Type 2 diabetes mellitus with hyperglycemia: Secondary | ICD-10-CM | POA: Diagnosis not present

## 2016-05-28 DIAGNOSIS — I69398 Other sequelae of cerebral infarction: Secondary | ICD-10-CM | POA: Diagnosis not present

## 2016-05-28 DIAGNOSIS — I10 Essential (primary) hypertension: Secondary | ICD-10-CM | POA: Diagnosis not present

## 2016-05-28 DIAGNOSIS — R4701 Aphasia: Secondary | ICD-10-CM | POA: Diagnosis not present

## 2016-05-31 DIAGNOSIS — E1140 Type 2 diabetes mellitus with diabetic neuropathy, unspecified: Secondary | ICD-10-CM | POA: Diagnosis not present

## 2016-05-31 DIAGNOSIS — L97529 Non-pressure chronic ulcer of other part of left foot with unspecified severity: Secondary | ICD-10-CM | POA: Diagnosis not present

## 2016-05-31 DIAGNOSIS — L0390 Cellulitis, unspecified: Secondary | ICD-10-CM | POA: Diagnosis not present

## 2016-05-31 DIAGNOSIS — I739 Peripheral vascular disease, unspecified: Secondary | ICD-10-CM | POA: Diagnosis not present

## 2016-06-01 DIAGNOSIS — I69398 Other sequelae of cerebral infarction: Secondary | ICD-10-CM | POA: Diagnosis not present

## 2016-06-01 DIAGNOSIS — I255 Ischemic cardiomyopathy: Secondary | ICD-10-CM | POA: Diagnosis not present

## 2016-06-01 DIAGNOSIS — E1165 Type 2 diabetes mellitus with hyperglycemia: Secondary | ICD-10-CM | POA: Diagnosis not present

## 2016-06-01 DIAGNOSIS — I10 Essential (primary) hypertension: Secondary | ICD-10-CM | POA: Diagnosis not present

## 2016-06-01 DIAGNOSIS — R4701 Aphasia: Secondary | ICD-10-CM | POA: Diagnosis not present

## 2016-06-01 DIAGNOSIS — M6281 Muscle weakness (generalized): Secondary | ICD-10-CM | POA: Diagnosis not present

## 2016-06-02 ENCOUNTER — Ambulatory Visit (HOSPITAL_COMMUNITY)
Admission: RE | Admit: 2016-06-02 | Discharge: 2016-06-02 | Disposition: A | Discharge status: Home / Self Care | Payer: Medicare Other | Source: Ambulatory Visit | Attending: Podiatry | Admitting: Podiatry

## 2016-06-02 DIAGNOSIS — M869 Osteomyelitis, unspecified: Secondary | ICD-10-CM | POA: Insufficient documentation

## 2016-06-02 DIAGNOSIS — S92352D Displaced fracture of fifth metatarsal bone, left foot, subsequent encounter for fracture with routine healing: Secondary | ICD-10-CM | POA: Diagnosis not present

## 2016-06-02 DIAGNOSIS — R4701 Aphasia: Secondary | ICD-10-CM | POA: Diagnosis not present

## 2016-06-02 DIAGNOSIS — E669 Obesity, unspecified: Secondary | ICD-10-CM | POA: Diagnosis not present

## 2016-06-02 DIAGNOSIS — M6281 Muscle weakness (generalized): Secondary | ICD-10-CM | POA: Diagnosis not present

## 2016-06-02 DIAGNOSIS — I69398 Other sequelae of cerebral infarction: Secondary | ICD-10-CM | POA: Diagnosis not present

## 2016-06-02 DIAGNOSIS — I255 Ischemic cardiomyopathy: Secondary | ICD-10-CM | POA: Diagnosis not present

## 2016-06-02 DIAGNOSIS — Z794 Long term (current) use of insulin: Secondary | ICD-10-CM | POA: Diagnosis not present

## 2016-06-02 DIAGNOSIS — S91202D Unspecified open wound of left great toe with damage to nail, subsequent encounter: Secondary | ICD-10-CM | POA: Diagnosis not present

## 2016-06-02 DIAGNOSIS — E785 Hyperlipidemia, unspecified: Secondary | ICD-10-CM | POA: Diagnosis not present

## 2016-06-02 DIAGNOSIS — E1165 Type 2 diabetes mellitus with hyperglycemia: Secondary | ICD-10-CM | POA: Diagnosis not present

## 2016-06-02 DIAGNOSIS — Z87891 Personal history of nicotine dependence: Secondary | ICD-10-CM | POA: Diagnosis not present

## 2016-06-02 DIAGNOSIS — N189 Chronic kidney disease, unspecified: Secondary | ICD-10-CM | POA: Diagnosis not present

## 2016-06-02 DIAGNOSIS — L97529 Non-pressure chronic ulcer of other part of left foot with unspecified severity: Secondary | ICD-10-CM | POA: Insufficient documentation

## 2016-06-02 DIAGNOSIS — S91101D Unspecified open wound of right great toe without damage to nail, subsequent encounter: Secondary | ICD-10-CM | POA: Diagnosis not present

## 2016-06-02 DIAGNOSIS — I129 Hypertensive chronic kidney disease with stage 1 through stage 4 chronic kidney disease, or unspecified chronic kidney disease: Secondary | ICD-10-CM | POA: Diagnosis not present

## 2016-06-04 DIAGNOSIS — I129 Hypertensive chronic kidney disease with stage 1 through stage 4 chronic kidney disease, or unspecified chronic kidney disease: Secondary | ICD-10-CM | POA: Diagnosis not present

## 2016-06-04 DIAGNOSIS — M6281 Muscle weakness (generalized): Secondary | ICD-10-CM | POA: Diagnosis not present

## 2016-06-04 DIAGNOSIS — I69398 Other sequelae of cerebral infarction: Secondary | ICD-10-CM | POA: Diagnosis not present

## 2016-06-04 DIAGNOSIS — S91101D Unspecified open wound of right great toe without damage to nail, subsequent encounter: Secondary | ICD-10-CM | POA: Diagnosis not present

## 2016-06-04 DIAGNOSIS — E1165 Type 2 diabetes mellitus with hyperglycemia: Secondary | ICD-10-CM | POA: Diagnosis not present

## 2016-06-04 DIAGNOSIS — S91202D Unspecified open wound of left great toe with damage to nail, subsequent encounter: Secondary | ICD-10-CM | POA: Diagnosis not present

## 2016-06-07 DIAGNOSIS — E1165 Type 2 diabetes mellitus with hyperglycemia: Secondary | ICD-10-CM | POA: Diagnosis not present

## 2016-06-07 DIAGNOSIS — I129 Hypertensive chronic kidney disease with stage 1 through stage 4 chronic kidney disease, or unspecified chronic kidney disease: Secondary | ICD-10-CM | POA: Diagnosis not present

## 2016-06-07 DIAGNOSIS — S91101D Unspecified open wound of right great toe without damage to nail, subsequent encounter: Secondary | ICD-10-CM | POA: Diagnosis not present

## 2016-06-07 DIAGNOSIS — I69398 Other sequelae of cerebral infarction: Secondary | ICD-10-CM | POA: Diagnosis not present

## 2016-06-07 DIAGNOSIS — S91202D Unspecified open wound of left great toe with damage to nail, subsequent encounter: Secondary | ICD-10-CM | POA: Diagnosis not present

## 2016-06-07 DIAGNOSIS — M6281 Muscle weakness (generalized): Secondary | ICD-10-CM | POA: Diagnosis not present

## 2016-06-08 DIAGNOSIS — I10 Essential (primary) hypertension: Secondary | ICD-10-CM | POA: Diagnosis not present

## 2016-06-08 DIAGNOSIS — I639 Cerebral infarction, unspecified: Secondary | ICD-10-CM | POA: Diagnosis not present

## 2016-06-08 DIAGNOSIS — E119 Type 2 diabetes mellitus without complications: Secondary | ICD-10-CM | POA: Diagnosis not present

## 2016-06-09 DIAGNOSIS — S91202D Unspecified open wound of left great toe with damage to nail, subsequent encounter: Secondary | ICD-10-CM | POA: Diagnosis not present

## 2016-06-09 DIAGNOSIS — I129 Hypertensive chronic kidney disease with stage 1 through stage 4 chronic kidney disease, or unspecified chronic kidney disease: Secondary | ICD-10-CM | POA: Diagnosis not present

## 2016-06-09 DIAGNOSIS — M6281 Muscle weakness (generalized): Secondary | ICD-10-CM | POA: Diagnosis not present

## 2016-06-09 DIAGNOSIS — I69398 Other sequelae of cerebral infarction: Secondary | ICD-10-CM | POA: Diagnosis not present

## 2016-06-09 DIAGNOSIS — S91101D Unspecified open wound of right great toe without damage to nail, subsequent encounter: Secondary | ICD-10-CM | POA: Diagnosis not present

## 2016-06-09 DIAGNOSIS — E1165 Type 2 diabetes mellitus with hyperglycemia: Secondary | ICD-10-CM | POA: Diagnosis not present

## 2016-06-11 DIAGNOSIS — E1165 Type 2 diabetes mellitus with hyperglycemia: Secondary | ICD-10-CM | POA: Diagnosis not present

## 2016-06-11 DIAGNOSIS — S91101D Unspecified open wound of right great toe without damage to nail, subsequent encounter: Secondary | ICD-10-CM | POA: Diagnosis not present

## 2016-06-11 DIAGNOSIS — I129 Hypertensive chronic kidney disease with stage 1 through stage 4 chronic kidney disease, or unspecified chronic kidney disease: Secondary | ICD-10-CM | POA: Diagnosis not present

## 2016-06-11 DIAGNOSIS — S91202D Unspecified open wound of left great toe with damage to nail, subsequent encounter: Secondary | ICD-10-CM | POA: Diagnosis not present

## 2016-06-11 DIAGNOSIS — M6281 Muscle weakness (generalized): Secondary | ICD-10-CM | POA: Diagnosis not present

## 2016-06-11 DIAGNOSIS — I69398 Other sequelae of cerebral infarction: Secondary | ICD-10-CM | POA: Diagnosis not present

## 2016-06-14 DIAGNOSIS — I69398 Other sequelae of cerebral infarction: Secondary | ICD-10-CM | POA: Diagnosis not present

## 2016-06-14 DIAGNOSIS — M6281 Muscle weakness (generalized): Secondary | ICD-10-CM | POA: Diagnosis not present

## 2016-06-14 DIAGNOSIS — S91101D Unspecified open wound of right great toe without damage to nail, subsequent encounter: Secondary | ICD-10-CM | POA: Diagnosis not present

## 2016-06-14 DIAGNOSIS — E1165 Type 2 diabetes mellitus with hyperglycemia: Secondary | ICD-10-CM | POA: Diagnosis not present

## 2016-06-14 DIAGNOSIS — I129 Hypertensive chronic kidney disease with stage 1 through stage 4 chronic kidney disease, or unspecified chronic kidney disease: Secondary | ICD-10-CM | POA: Diagnosis not present

## 2016-06-14 DIAGNOSIS — S91202D Unspecified open wound of left great toe with damage to nail, subsequent encounter: Secondary | ICD-10-CM | POA: Diagnosis not present

## 2016-06-16 ENCOUNTER — Encounter: Payer: Medicare Other | Admitting: Vascular Surgery

## 2016-06-16 DIAGNOSIS — M6281 Muscle weakness (generalized): Secondary | ICD-10-CM | POA: Diagnosis not present

## 2016-06-16 DIAGNOSIS — I69398 Other sequelae of cerebral infarction: Secondary | ICD-10-CM | POA: Diagnosis not present

## 2016-06-16 DIAGNOSIS — E1165 Type 2 diabetes mellitus with hyperglycemia: Secondary | ICD-10-CM | POA: Diagnosis not present

## 2016-06-16 DIAGNOSIS — I129 Hypertensive chronic kidney disease with stage 1 through stage 4 chronic kidney disease, or unspecified chronic kidney disease: Secondary | ICD-10-CM | POA: Diagnosis not present

## 2016-06-16 DIAGNOSIS — S91202D Unspecified open wound of left great toe with damage to nail, subsequent encounter: Secondary | ICD-10-CM | POA: Diagnosis not present

## 2016-06-16 DIAGNOSIS — S91101D Unspecified open wound of right great toe without damage to nail, subsequent encounter: Secondary | ICD-10-CM | POA: Diagnosis not present

## 2016-06-18 DIAGNOSIS — S91101D Unspecified open wound of right great toe without damage to nail, subsequent encounter: Secondary | ICD-10-CM | POA: Diagnosis not present

## 2016-06-18 DIAGNOSIS — I129 Hypertensive chronic kidney disease with stage 1 through stage 4 chronic kidney disease, or unspecified chronic kidney disease: Secondary | ICD-10-CM | POA: Diagnosis not present

## 2016-06-18 DIAGNOSIS — E1165 Type 2 diabetes mellitus with hyperglycemia: Secondary | ICD-10-CM | POA: Diagnosis not present

## 2016-06-18 DIAGNOSIS — M6281 Muscle weakness (generalized): Secondary | ICD-10-CM | POA: Diagnosis not present

## 2016-06-18 DIAGNOSIS — S91202D Unspecified open wound of left great toe with damage to nail, subsequent encounter: Secondary | ICD-10-CM | POA: Diagnosis not present

## 2016-06-18 DIAGNOSIS — I69398 Other sequelae of cerebral infarction: Secondary | ICD-10-CM | POA: Diagnosis not present

## 2016-06-21 ENCOUNTER — Encounter: Payer: Self-pay | Admitting: Vascular Surgery

## 2016-06-23 ENCOUNTER — Ambulatory Visit (INDEPENDENT_AMBULATORY_CARE_PROVIDER_SITE_OTHER): Payer: Medicare Other | Admitting: Vascular Surgery

## 2016-06-23 ENCOUNTER — Encounter: Payer: Self-pay | Admitting: Vascular Surgery

## 2016-06-23 VITALS — BP 128/79 | HR 90 | Temp 98.9°F | Resp 20 | Ht 69.0 in | Wt 216.0 lb

## 2016-06-23 DIAGNOSIS — S91302A Unspecified open wound, left foot, initial encounter: Secondary | ICD-10-CM | POA: Diagnosis not present

## 2016-06-23 NOTE — Progress Notes (Signed)
History of Present Illness:  Patient is a 61 y.o. year old male who presents for evaluation of his LE's secondary to chronic left great toe wound and new right second toe wound.  .  The great toe wound has been present for >6 months.  He has been followed by Dr. Allena Katz in [place].  He was last seen 3 weeks ago and given oral antibiotics for positive MRSA infection per his Cousin that is here with him today.  Atherosclerotic risk factors and other medical problems include DM managed with insulin, CAD, stroke now on Plavix daily, HTN managed with beta blockers and hyperlipidemia managed with a stain daily.    Past Medical History:  Diagnosis Date  . 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 Chronic kidney disease   . Diabetes mellitus without complication (HCC)   . Hyperlipidemia   . Hypertension   . Slow rate of speech   . Stroke [facility])    1 year ago-left and shaky and unsteady on feet.    Past Surgical History:  Procedure Laterality Date  . CATARACT EXTRACTION W/PHACO Right 06/18/2013   Procedure: CATARACT EXTRACTION PHACO AND INTRAOCULAR LENS PLACEMENT (IOC);  Surgeon: Gemma Payor, MD;  Location: [facility];  Service: Ophthalmology;  Laterality: Right;  CDE 34.55  . ORIF ANKLE DISLOCATION Right     ROS:   General:  No weight loss, Fever, chills  HEENT: No recent headaches, no nasal bleeding, no visual changes, no sore throat  Neurologic: No dizziness, blackouts, seizures. No recent symptoms of stroke or mini- stroke. No recent episodes of slurred speech, or temporary blindness.  Cardiac: No recent episodes of chest pain/pressure, no shortness of breath at rest.  No shortness of breath with exertion.  Denies history of atrial fibrillation or irregular heartbeat  Vascular: No history of rest pain in feet.  No history of claudication.  No history of non-healing ulcer, No history of DVT   Pulmonary: No home oxygen, no productive cough, no hemoptysis,  No asthma or  wheezing  Musculoskeletal:  [ ]  Arthritis, [ ]  Low back pain,  [ ]  Joint pain  Hematologic:No history of hypercoagulable state.  No history of easy bleeding.  No history of anemia  Gastrointestinal: No hematochezia or melena,  No gastroesophageal reflux, no trouble swallowing  Urinary: [ ]  chronic Kidney disease, [ ]  on HD - [ ]  MWF or [ ]  TTHS, [ ]  Burning with urination, [ ]  Frequent urination, [ ]  Difficulty urinating;   Skin: No rashes  Psychological: No history of anxiety,  No history of depression  Social History Social History  Substance Use Topics  . Smoking status: Former Smoker    Packs/day: 1.00    Years: 1.00    Types: Cigarettes    Quit date: 06/16/1991  . Smokeless tobacco: Never Used  . Alcohol use No    Family History History reviewed. No pertinent family history.  Allergies  No Known Allergies   Current Outpatient Prescriptions  Medication Sig Dispense Refill  . aspirin EC 81 MG tablet Take 81 mg by mouth daily.    Marland Kitchen atorvastatin (LIPITOR) 40 MG tablet Take 40 mg by mouth daily.    . carvedilol (COREG) 3.125 MG tablet Take by mouth.    . empagliflozin (JARDIANCE) 10 MG TABS tablet Take by mouth.    . insulin detemir (LEVEMIR) 100 UNIT/ML injection Inject 40 Units into the skin daily.    Marland Kitchen lisinopril (PRINIVIL,ZESTRIL) 10  MG tablet Take 10 mg by mouth daily.    . metFORMIN (GLUCOPHAGE) 1000 MG tablet Take 1,000 mg by mouth 2 (two) times daily with a meal.    . Terbinafine HCl (LAMISIL PO) Take by mouth.    Marland Kitchen amLODipine (NORVASC) 10 MG tablet Take 10 mg by mouth daily.    Marland Kitchen atenolol (TENORMIN) 100 MG tablet Take 100 mg by mouth daily.    . clopidogrel (PLAVIX) 75 MG tablet Take by mouth.    . pioglitazone (ACTOS) 30 MG tablet Take 30 mg by mouth daily.     No current facility-administered medications for this visit.     Physical Examination  Vitals:   06/23/16 1444  BP: 128/79  Pulse: 90  Resp: 20  Temp: 98.9 F (37.2 C)  TempSrc: Oral  SpO2:  95%  Weight: 216 lb (98 kg)  Height: 5\' 9"  (1.753 m)    Body mass index is 31.9 kg/m.  General:  Alert and oriented, no acute distress HEENT: Normal Neck: No bruit or JVD Pulmonary: Clear to auscultation bilaterally Cardiac: Regular Rate and Rhythm without murmur Abdomen: Soft, non-tender, non-distended, no mass, no scars Skin: No rash, left great toe ulcer without erythema or drainage, right second toe with kissing ulcer medial and lateral superficial Extremity Pulses:  2+ radial, brachial, femoral, popliteal and AT on the left pulses bilaterally Musculoskeletal: No deformity, positive  Edema left > right  Neurologic: Upper and lower extremity motor 5/5, left UE 4+/5  DATA:  [facility] 05/04/2016 Out side ABI report  Right 0.80 Left 1.0  MRI IMPRESSION: 06/03/2016 left foot 1. Marrow edema in the first distal phalanx with cortical regularity along the distal dorsal aspect concerning for osteomyelitis. 2. Healing fracture of the left fifth metatarsal neck with mild persistent marrow edema.   ASSESSMENT:  Chronic DM foot ulcer   PLAN:  He has palpable pulse popliteal and AT on the left with normal ABI's.  No pedal pulses.  Doppler signals PT/DP/Peronela on exam B.  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  This unfortunately is not a condition to be revascularized.  We recommend continued debridement and or amputation may be in his near future.  He will continue to be followed by his podiatrist at this time.  F/U in our office PRN.   Thomasena Edis, Melisha Eggleton MAUREEN PA-C [facility]  The patient was sen in conjunction with Dr. Arbie Cookey  I have examined the patient, reviewed and agree with above. Suspect small vessel disease in his toes. Does have easily palpable anterior tibial artery just at the level of his ankle on the left and 2-3+ popliteal pulses bilaterally. May continue to have difficulty with healing of his toes but no role  for further evaluation from a vascular standpoint.  Gretta Began, MD 06/23/2016 3:57 PM

## 2016-06-25 DIAGNOSIS — M6281 Muscle weakness (generalized): Secondary | ICD-10-CM | POA: Diagnosis not present

## 2016-06-25 DIAGNOSIS — E1165 Type 2 diabetes mellitus with hyperglycemia: Secondary | ICD-10-CM | POA: Diagnosis not present

## 2016-06-25 DIAGNOSIS — I69398 Other sequelae of cerebral infarction: Secondary | ICD-10-CM | POA: Diagnosis not present

## 2016-06-25 DIAGNOSIS — S91101D Unspecified open wound of right great toe without damage to nail, subsequent encounter: Secondary | ICD-10-CM | POA: Diagnosis not present

## 2016-06-25 DIAGNOSIS — I129 Hypertensive chronic kidney disease with stage 1 through stage 4 chronic kidney disease, or unspecified chronic kidney disease: Secondary | ICD-10-CM | POA: Diagnosis not present

## 2016-06-25 DIAGNOSIS — S91202D Unspecified open wound of left great toe with damage to nail, subsequent encounter: Secondary | ICD-10-CM | POA: Diagnosis not present

## 2016-06-28 ENCOUNTER — Ambulatory Visit (HOSPITAL_COMMUNITY)
Admission: RE | Admit: 2016-06-28 | Discharge: 2016-06-28 | Disposition: A | Discharge status: Home / Self Care | Payer: Medicare Other | Source: Ambulatory Visit | Attending: Podiatry | Admitting: Podiatry

## 2016-06-28 ENCOUNTER — Other Ambulatory Visit (HOSPITAL_COMMUNITY): Payer: Self-pay | Admitting: Podiatry

## 2016-06-28 DIAGNOSIS — L97529 Non-pressure chronic ulcer of other part of left foot with unspecified severity: Secondary | ICD-10-CM | POA: Diagnosis not present

## 2016-06-28 DIAGNOSIS — I82401 Acute embolism and thrombosis of unspecified deep veins of right lower extremity: Secondary | ICD-10-CM | POA: Insufficient documentation

## 2016-06-28 DIAGNOSIS — I824Y1 Acute embolism and thrombosis of unspecified deep veins of right proximal lower extremity: Secondary | ICD-10-CM

## 2016-06-28 DIAGNOSIS — E1140 Type 2 diabetes mellitus with diabetic neuropathy, unspecified: Secondary | ICD-10-CM | POA: Diagnosis not present

## 2016-06-28 DIAGNOSIS — L97519 Non-pressure chronic ulcer of other part of right foot with unspecified severity: Secondary | ICD-10-CM | POA: Diagnosis not present

## 2016-06-30 DIAGNOSIS — S91202D Unspecified open wound of left great toe with damage to nail, subsequent encounter: Secondary | ICD-10-CM | POA: Diagnosis not present

## 2016-06-30 DIAGNOSIS — I69398 Other sequelae of cerebral infarction: Secondary | ICD-10-CM | POA: Diagnosis not present

## 2016-06-30 DIAGNOSIS — S91101D Unspecified open wound of right great toe without damage to nail, subsequent encounter: Secondary | ICD-10-CM | POA: Diagnosis not present

## 2016-06-30 DIAGNOSIS — I129 Hypertensive chronic kidney disease with stage 1 through stage 4 chronic kidney disease, or unspecified chronic kidney disease: Secondary | ICD-10-CM | POA: Diagnosis not present

## 2016-06-30 DIAGNOSIS — M6281 Muscle weakness (generalized): Secondary | ICD-10-CM | POA: Diagnosis not present

## 2016-06-30 DIAGNOSIS — E1165 Type 2 diabetes mellitus with hyperglycemia: Secondary | ICD-10-CM | POA: Diagnosis not present

## 2016-07-02 DIAGNOSIS — E1165 Type 2 diabetes mellitus with hyperglycemia: Secondary | ICD-10-CM | POA: Diagnosis not present

## 2016-07-02 DIAGNOSIS — S91101D Unspecified open wound of right great toe without damage to nail, subsequent encounter: Secondary | ICD-10-CM | POA: Diagnosis not present

## 2016-07-02 DIAGNOSIS — M6281 Muscle weakness (generalized): Secondary | ICD-10-CM | POA: Diagnosis not present

## 2016-07-02 DIAGNOSIS — I69398 Other sequelae of cerebral infarction: Secondary | ICD-10-CM | POA: Diagnosis not present

## 2016-07-02 DIAGNOSIS — S91202D Unspecified open wound of left great toe with damage to nail, subsequent encounter: Secondary | ICD-10-CM | POA: Diagnosis not present

## 2016-07-02 DIAGNOSIS — I129 Hypertensive chronic kidney disease with stage 1 through stage 4 chronic kidney disease, or unspecified chronic kidney disease: Secondary | ICD-10-CM | POA: Diagnosis not present

## 2016-07-05 DIAGNOSIS — I69398 Other sequelae of cerebral infarction: Secondary | ICD-10-CM | POA: Diagnosis not present

## 2016-07-05 DIAGNOSIS — S91202D Unspecified open wound of left great toe with damage to nail, subsequent encounter: Secondary | ICD-10-CM | POA: Diagnosis not present

## 2016-07-05 DIAGNOSIS — I129 Hypertensive chronic kidney disease with stage 1 through stage 4 chronic kidney disease, or unspecified chronic kidney disease: Secondary | ICD-10-CM | POA: Diagnosis not present

## 2016-07-05 DIAGNOSIS — S91101D Unspecified open wound of right great toe without damage to nail, subsequent encounter: Secondary | ICD-10-CM | POA: Diagnosis not present

## 2016-07-05 DIAGNOSIS — M6281 Muscle weakness (generalized): Secondary | ICD-10-CM | POA: Diagnosis not present

## 2016-07-05 DIAGNOSIS — E1165 Type 2 diabetes mellitus with hyperglycemia: Secondary | ICD-10-CM | POA: Diagnosis not present

## 2016-07-07 DIAGNOSIS — S91202D Unspecified open wound of left great toe with damage to nail, subsequent encounter: Secondary | ICD-10-CM | POA: Diagnosis not present

## 2016-07-07 DIAGNOSIS — I129 Hypertensive chronic kidney disease with stage 1 through stage 4 chronic kidney disease, or unspecified chronic kidney disease: Secondary | ICD-10-CM | POA: Diagnosis not present

## 2016-07-07 DIAGNOSIS — M6281 Muscle weakness (generalized): Secondary | ICD-10-CM | POA: Diagnosis not present

## 2016-07-07 DIAGNOSIS — E1165 Type 2 diabetes mellitus with hyperglycemia: Secondary | ICD-10-CM | POA: Diagnosis not present

## 2016-07-07 DIAGNOSIS — S91101D Unspecified open wound of right great toe without damage to nail, subsequent encounter: Secondary | ICD-10-CM | POA: Diagnosis not present

## 2016-07-07 DIAGNOSIS — I69398 Other sequelae of cerebral infarction: Secondary | ICD-10-CM | POA: Diagnosis not present

## 2016-07-12 DIAGNOSIS — M6281 Muscle weakness (generalized): Secondary | ICD-10-CM | POA: Diagnosis not present

## 2016-07-12 DIAGNOSIS — E1165 Type 2 diabetes mellitus with hyperglycemia: Secondary | ICD-10-CM | POA: Diagnosis not present

## 2016-07-12 DIAGNOSIS — I69398 Other sequelae of cerebral infarction: Secondary | ICD-10-CM | POA: Diagnosis not present

## 2016-07-12 DIAGNOSIS — S91101D Unspecified open wound of right great toe without damage to nail, subsequent encounter: Secondary | ICD-10-CM | POA: Diagnosis not present

## 2016-07-12 DIAGNOSIS — I129 Hypertensive chronic kidney disease with stage 1 through stage 4 chronic kidney disease, or unspecified chronic kidney disease: Secondary | ICD-10-CM | POA: Diagnosis not present

## 2016-07-12 DIAGNOSIS — S91202D Unspecified open wound of left great toe with damage to nail, subsequent encounter: Secondary | ICD-10-CM | POA: Diagnosis not present

## 2016-07-14 DIAGNOSIS — E1165 Type 2 diabetes mellitus with hyperglycemia: Secondary | ICD-10-CM | POA: Diagnosis not present

## 2016-07-14 DIAGNOSIS — M6281 Muscle weakness (generalized): Secondary | ICD-10-CM | POA: Diagnosis not present

## 2016-07-14 DIAGNOSIS — S91202D Unspecified open wound of left great toe with damage to nail, subsequent encounter: Secondary | ICD-10-CM | POA: Diagnosis not present

## 2016-07-14 DIAGNOSIS — I129 Hypertensive chronic kidney disease with stage 1 through stage 4 chronic kidney disease, or unspecified chronic kidney disease: Secondary | ICD-10-CM | POA: Diagnosis not present

## 2016-07-14 DIAGNOSIS — I69398 Other sequelae of cerebral infarction: Secondary | ICD-10-CM | POA: Diagnosis not present

## 2016-07-14 DIAGNOSIS — S91101D Unspecified open wound of right great toe without damage to nail, subsequent encounter: Secondary | ICD-10-CM | POA: Diagnosis not present

## 2016-07-16 DIAGNOSIS — I69398 Other sequelae of cerebral infarction: Secondary | ICD-10-CM | POA: Diagnosis not present

## 2016-07-16 DIAGNOSIS — S91101D Unspecified open wound of right great toe without damage to nail, subsequent encounter: Secondary | ICD-10-CM | POA: Diagnosis not present

## 2016-07-16 DIAGNOSIS — S91202D Unspecified open wound of left great toe with damage to nail, subsequent encounter: Secondary | ICD-10-CM | POA: Diagnosis not present

## 2016-07-16 DIAGNOSIS — E1165 Type 2 diabetes mellitus with hyperglycemia: Secondary | ICD-10-CM | POA: Diagnosis not present

## 2016-07-16 DIAGNOSIS — I129 Hypertensive chronic kidney disease with stage 1 through stage 4 chronic kidney disease, or unspecified chronic kidney disease: Secondary | ICD-10-CM | POA: Diagnosis not present

## 2016-07-16 DIAGNOSIS — M6281 Muscle weakness (generalized): Secondary | ICD-10-CM | POA: Diagnosis not present

## 2016-07-19 DIAGNOSIS — L97529 Non-pressure chronic ulcer of other part of left foot with unspecified severity: Secondary | ICD-10-CM | POA: Diagnosis not present

## 2016-07-19 DIAGNOSIS — E1140 Type 2 diabetes mellitus with diabetic neuropathy, unspecified: Secondary | ICD-10-CM | POA: Diagnosis not present

## 2016-07-21 DIAGNOSIS — S91101D Unspecified open wound of right great toe without damage to nail, subsequent encounter: Secondary | ICD-10-CM | POA: Diagnosis not present

## 2016-07-21 DIAGNOSIS — S91202D Unspecified open wound of left great toe with damage to nail, subsequent encounter: Secondary | ICD-10-CM | POA: Diagnosis not present

## 2016-07-21 DIAGNOSIS — E1165 Type 2 diabetes mellitus with hyperglycemia: Secondary | ICD-10-CM | POA: Diagnosis not present

## 2016-07-21 DIAGNOSIS — I129 Hypertensive chronic kidney disease with stage 1 through stage 4 chronic kidney disease, or unspecified chronic kidney disease: Secondary | ICD-10-CM | POA: Diagnosis not present

## 2016-07-21 DIAGNOSIS — I69398 Other sequelae of cerebral infarction: Secondary | ICD-10-CM | POA: Diagnosis not present

## 2016-07-21 DIAGNOSIS — M6281 Muscle weakness (generalized): Secondary | ICD-10-CM | POA: Diagnosis not present

## 2016-07-22 DIAGNOSIS — E119 Type 2 diabetes mellitus without complications: Secondary | ICD-10-CM | POA: Diagnosis not present

## 2016-07-22 DIAGNOSIS — I639 Cerebral infarction, unspecified: Secondary | ICD-10-CM | POA: Diagnosis not present

## 2016-07-22 DIAGNOSIS — I10 Essential (primary) hypertension: Secondary | ICD-10-CM | POA: Diagnosis not present

## 2016-07-26 DIAGNOSIS — Z8673 Personal history of transient ischemic attack (TIA), and cerebral infarction without residual deficits: Secondary | ICD-10-CM | POA: Insufficient documentation

## 2016-07-26 DIAGNOSIS — S91101D Unspecified open wound of right great toe without damage to nail, subsequent encounter: Secondary | ICD-10-CM | POA: Diagnosis not present

## 2016-07-26 DIAGNOSIS — I129 Hypertensive chronic kidney disease with stage 1 through stage 4 chronic kidney disease, or unspecified chronic kidney disease: Secondary | ICD-10-CM | POA: Diagnosis not present

## 2016-07-26 DIAGNOSIS — Z299 Encounter for prophylactic measures, unspecified: Secondary | ICD-10-CM | POA: Diagnosis not present

## 2016-07-26 DIAGNOSIS — I10 Essential (primary) hypertension: Secondary | ICD-10-CM | POA: Diagnosis not present

## 2016-07-26 DIAGNOSIS — E1122 Type 2 diabetes mellitus with diabetic chronic kidney disease: Secondary | ICD-10-CM | POA: Diagnosis not present

## 2016-07-26 DIAGNOSIS — E785 Hyperlipidemia, unspecified: Secondary | ICD-10-CM | POA: Insufficient documentation

## 2016-07-26 DIAGNOSIS — I4891 Unspecified atrial fibrillation: Secondary | ICD-10-CM | POA: Insufficient documentation

## 2016-07-26 DIAGNOSIS — Z87891 Personal history of nicotine dependence: Secondary | ICD-10-CM | POA: Insufficient documentation

## 2016-07-26 DIAGNOSIS — E119 Type 2 diabetes mellitus without complications: Secondary | ICD-10-CM | POA: Insufficient documentation

## 2016-07-26 DIAGNOSIS — E7800 Pure hypercholesterolemia, unspecified: Secondary | ICD-10-CM | POA: Diagnosis not present

## 2016-07-26 DIAGNOSIS — I69398 Other sequelae of cerebral infarction: Secondary | ICD-10-CM | POA: Diagnosis not present

## 2016-07-26 DIAGNOSIS — R609 Edema, unspecified: Secondary | ICD-10-CM | POA: Diagnosis not present

## 2016-07-26 DIAGNOSIS — I2510 Atherosclerotic heart disease of native coronary artery without angina pectoris: Secondary | ICD-10-CM | POA: Insufficient documentation

## 2016-07-26 DIAGNOSIS — M869 Osteomyelitis, unspecified: Secondary | ICD-10-CM | POA: Insufficient documentation

## 2016-07-26 DIAGNOSIS — Z6832 Body mass index (BMI) 32.0-32.9, adult: Secondary | ICD-10-CM | POA: Diagnosis not present

## 2016-07-26 DIAGNOSIS — S91202D Unspecified open wound of left great toe with damage to nail, subsequent encounter: Secondary | ICD-10-CM | POA: Diagnosis not present

## 2016-07-26 DIAGNOSIS — M6281 Muscle weakness (generalized): Secondary | ICD-10-CM | POA: Diagnosis not present

## 2016-07-26 DIAGNOSIS — N181 Chronic kidney disease, stage 1: Secondary | ICD-10-CM | POA: Diagnosis not present

## 2016-07-26 DIAGNOSIS — E1165 Type 2 diabetes mellitus with hyperglycemia: Secondary | ICD-10-CM | POA: Diagnosis not present

## 2016-07-26 DIAGNOSIS — Z713 Dietary counseling and surveillance: Secondary | ICD-10-CM | POA: Diagnosis not present

## 2016-07-26 DIAGNOSIS — L97529 Non-pressure chronic ulcer of other part of left foot with unspecified severity: Secondary | ICD-10-CM | POA: Diagnosis not present

## 2016-07-27 ENCOUNTER — Encounter: Payer: Self-pay | Admitting: Internal Medicine

## 2016-07-27 ENCOUNTER — Ambulatory Visit (INDEPENDENT_AMBULATORY_CARE_PROVIDER_SITE_OTHER): Payer: Medicare Other | Admitting: Internal Medicine

## 2016-07-27 DIAGNOSIS — Z87891 Personal history of nicotine dependence: Secondary | ICD-10-CM

## 2016-07-27 DIAGNOSIS — M869 Osteomyelitis, unspecified: Secondary | ICD-10-CM

## 2016-07-27 DIAGNOSIS — E785 Hyperlipidemia, unspecified: Secondary | ICD-10-CM

## 2016-07-27 DIAGNOSIS — I4891 Unspecified atrial fibrillation: Secondary | ICD-10-CM

## 2016-07-27 DIAGNOSIS — I10 Essential (primary) hypertension: Secondary | ICD-10-CM | POA: Diagnosis present

## 2016-07-27 DIAGNOSIS — Z8673 Personal history of transient ischemic attack (TIA), and cerebral infarction without residual deficits: Secondary | ICD-10-CM | POA: Diagnosis not present

## 2016-07-27 LAB — BASIC METABOLIC PANEL
BUN: 31 mg/dL — ABNORMAL HIGH (ref 7–25)
CHLORIDE: 106 mmol/L (ref 98–110)
CO2: 24 mmol/L (ref 20–31)
CREATININE: 0.6 mg/dL — AB (ref 0.70–1.25)
Calcium: 9.3 mg/dL (ref 8.6–10.3)
Glucose, Bld: 132 mg/dL — ABNORMAL HIGH (ref 65–99)
Potassium: 4.6 mmol/L (ref 3.5–5.3)
Sodium: 140 mmol/L (ref 135–146)

## 2016-07-27 MED ORDER — DOXYCYCLINE HYCLATE 100 MG PO CAPS: 100.0000 mg | ORAL_CAPSULE | Freq: Two times a day (BID) | ORAL | 1 refills | Status: DC

## 2016-07-27 NOTE — Progress Notes (Signed)
[facility]  Reason for Consult: Left great toe osteomyelitis Referring Physician: Dr. Erskine Emery  Patient Active Problem List   Diagnosis Date Noted  . Osteomyelitis of toe of left foot (HCC) 07/26/2016    Priority: High  . Diabetes mellitus (HCC) 07/26/2016  . Hypertension 07/26/2016  . Coronary artery disease 07/26/2016  . History of CVA (cerebrovascular accident) 07/26/2016  . Atrial fibrillation (HCC) 07/26/2016  . Dyslipidemia 07/26/2016  . Former cigarette smoker 07/26/2016    Patient's Medications  New Prescriptions   No medications on file  Previous Medications   AMLODIPINE (NORVASC) 10 MG TABLET    Take 10 mg by mouth daily.   ASPIRIN EC 81 MG TABLET    Take 81 mg by mouth daily.   ATENOLOL (TENORMIN) 100 MG TABLET    Take 100 mg by mouth daily.   ATORVASTATIN (LIPITOR) 40 MG TABLET    Take 40 mg by mouth daily.   CARVEDILOL (COREG) 3.125 MG TABLET    Take by mouth.   CLOPIDOGREL (PLAVIX) 75 MG TABLET    Take by mouth.   EMPAGLIFLOZIN (JARDIANCE) 10 MG TABS TABLET    Take by mouth.   INSULIN DETEMIR (LEVEMIR) 100 UNIT/ML INJECTION    Inject 40 Units into the skin daily.   LISINOPRIL (PRINIVIL,ZESTRIL) 10 MG TABLET    Take 10 mg by mouth daily.   METFORMIN (GLUCOPHAGE) 1000 MG TABLET    Take 1,000 mg by mouth 2 (two) times daily with a meal.   PIOGLITAZONE (ACTOS) 30 MG TABLET    Take 30 mg by mouth daily.   TERBINAFINE HCL (LAMISIL PO)    Take by mouth.  Modified Medications   No medications on file  Discontinued Medications   No medications on file    Recommendations: 1. Check BMP, sedimentation rate and C-reactive protein  2. Doxycycline 100 mg twice a day  Assessment: Matthew Mccormick appears to have osteomyelitis of the distal phalanx of the left great toe. It has been partially treated with oral ciprofloxacin and clindamycin. The wound no longer probes deeply to bone. I will check his inflammatory markers and treat  him with a narrower regimen of oral doxycycline. I will see him back in one month.  HPI: Matthew Mccormick is a 61 y.o. male with history of diabetes, hypertension, coronary artery disease and previous stroke who developed an ulcer on his left great toe about one year ago. He also got progressively worse and he was referred to Dr. Allena Katz. Several months ago it was noted that the wound probed down to bone. A culture of wound drainage on 05/06/2016 grew MRSA. He received a seven-day course of ciprofloxacin and a 10 day course of clindamycin. The wound has improved significantly since that time and now is only very superficial. He has developed a superficial ulcer on the lateral side of his right second toe over the past 2 months.  Review of Systems: Review of Systems  Constitutional: Negative for chills, diaphoresis, fever and weight loss.  Gastrointestinal: Negative for abdominal pain, diarrhea, nausea and vomiting.  Musculoskeletal: Negative for joint pain.      Past Medical History:  Diagnosis Date  . 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 Chronic kidney disease   . Diabetes mellitus without complication (HCC)   . Hyperlipidemia   . Hypertension   . Slow rate of speech   . Stroke [facility])  1 year ago-left and shaky and unsteady on feet.    Social History  Substance Use Topics  . Smoking status: Former Smoker    Packs/day: 1.00    Years: 1.00    Types: Cigarettes    Quit date: 06/16/1991  . Smokeless tobacco: Never Used  . Alcohol use No    No family history on file. No Known Allergies  OBJECTIVE: Vitals:   07/27/16 0850  BP: 112/77  Pulse: 97  Temp: 98.7 F (37.1 C)  TempSrc: Oral  Weight: 227 lb (103 kg)  Height: 5\' 11"  (1.803 m)   Body mass index is 31.66 kg/m.   Physical Exam  Constitutional:  He is alert and in no distress. He is accompanied by his cousin, [name], who provides all of his history.  Musculoskeletal:  He has a very superficial 10 mm  ulcer on the medial aspect of his left great toe. There is no surrounding cellulitis or fluctuance. There is no drainage or odor. He also has an ulcerated area on the lateral side of his right second toe. There is no surrounding cellulitis, fluctuance, drainage or odor.  Neurological: He is alert.    Microbiology: No results found for this or any previous visit (from the past 240 hour(s)).  MRI of left foot 06/02/2016  IMPRESSION: 1. Marrow edema in the first distal phalanx with cortical regularity along the distal dorsal aspect concerning for osteomyelitis. 2. Healing fracture of the left fifth metatarsal neck with mild persistent marrow edema.   Electronically Signed   By: Elige Ko   On: 06/03/2016 08:12  Cliffton Asters, MD [facility] [phone] pager   [phone] cell 07/27/2016, 9:23 AM

## 2016-07-28 DIAGNOSIS — E1165 Type 2 diabetes mellitus with hyperglycemia: Secondary | ICD-10-CM | POA: Diagnosis not present

## 2016-07-28 DIAGNOSIS — S91101D Unspecified open wound of right great toe without damage to nail, subsequent encounter: Secondary | ICD-10-CM | POA: Diagnosis not present

## 2016-07-28 DIAGNOSIS — I129 Hypertensive chronic kidney disease with stage 1 through stage 4 chronic kidney disease, or unspecified chronic kidney disease: Secondary | ICD-10-CM | POA: Diagnosis not present

## 2016-07-28 DIAGNOSIS — I69398 Other sequelae of cerebral infarction: Secondary | ICD-10-CM | POA: Diagnosis not present

## 2016-07-28 DIAGNOSIS — M6281 Muscle weakness (generalized): Secondary | ICD-10-CM | POA: Diagnosis not present

## 2016-07-28 DIAGNOSIS — S91202D Unspecified open wound of left great toe with damage to nail, subsequent encounter: Secondary | ICD-10-CM | POA: Diagnosis not present

## 2016-07-28 LAB — SEDIMENTATION RATE: Sed Rate: 1 mm/hr (ref 0–20)

## 2016-07-28 LAB — C-REACTIVE PROTEIN: CRP: 1.3 mg/L (ref <8.0)

## 2016-07-29 DIAGNOSIS — I10 Essential (primary) hypertension: Secondary | ICD-10-CM | POA: Diagnosis not present

## 2016-07-29 DIAGNOSIS — I8393 Asymptomatic varicose veins of bilateral lower extremities: Secondary | ICD-10-CM | POA: Diagnosis not present

## 2016-07-29 DIAGNOSIS — Z794 Long term (current) use of insulin: Secondary | ICD-10-CM | POA: Diagnosis not present

## 2016-07-29 DIAGNOSIS — L97514 Non-pressure chronic ulcer of other part of right foot with necrosis of bone: Secondary | ICD-10-CM | POA: Diagnosis not present

## 2016-07-29 DIAGNOSIS — Z8673 Personal history of transient ischemic attack (TIA), and cerebral infarction without residual deficits: Secondary | ICD-10-CM | POA: Diagnosis not present

## 2016-07-29 DIAGNOSIS — E1169 Type 2 diabetes mellitus with other specified complication: Secondary | ICD-10-CM | POA: Diagnosis not present

## 2016-07-29 DIAGNOSIS — Z7902 Long term (current) use of antithrombotics/antiplatelets: Secondary | ICD-10-CM | POA: Diagnosis not present

## 2016-07-29 DIAGNOSIS — Z7982 Long term (current) use of aspirin: Secondary | ICD-10-CM | POA: Diagnosis not present

## 2016-07-29 DIAGNOSIS — M869 Osteomyelitis, unspecified: Secondary | ICD-10-CM | POA: Diagnosis not present

## 2016-07-29 DIAGNOSIS — L84 Corns and callosities: Secondary | ICD-10-CM | POA: Diagnosis not present

## 2016-07-29 DIAGNOSIS — Z79899 Other long term (current) drug therapy: Secondary | ICD-10-CM | POA: Diagnosis not present

## 2016-07-29 DIAGNOSIS — M86671 Other chronic osteomyelitis, right ankle and foot: Secondary | ICD-10-CM | POA: Diagnosis not present

## 2016-07-31 DIAGNOSIS — I69398 Other sequelae of cerebral infarction: Secondary | ICD-10-CM | POA: Diagnosis not present

## 2016-07-31 DIAGNOSIS — M6281 Muscle weakness (generalized): Secondary | ICD-10-CM | POA: Diagnosis not present

## 2016-07-31 DIAGNOSIS — S91202D Unspecified open wound of left great toe with damage to nail, subsequent encounter: Secondary | ICD-10-CM | POA: Diagnosis not present

## 2016-07-31 DIAGNOSIS — S91101D Unspecified open wound of right great toe without damage to nail, subsequent encounter: Secondary | ICD-10-CM | POA: Diagnosis not present

## 2016-07-31 DIAGNOSIS — I129 Hypertensive chronic kidney disease with stage 1 through stage 4 chronic kidney disease, or unspecified chronic kidney disease: Secondary | ICD-10-CM | POA: Diagnosis not present

## 2016-07-31 DIAGNOSIS — E1165 Type 2 diabetes mellitus with hyperglycemia: Secondary | ICD-10-CM | POA: Diagnosis not present

## 2016-08-01 DIAGNOSIS — E11621 Type 2 diabetes mellitus with foot ulcer: Secondary | ICD-10-CM | POA: Diagnosis not present

## 2016-08-01 DIAGNOSIS — E785 Hyperlipidemia, unspecified: Secondary | ICD-10-CM | POA: Diagnosis not present

## 2016-08-01 DIAGNOSIS — R4701 Aphasia: Secondary | ICD-10-CM | POA: Diagnosis not present

## 2016-08-01 DIAGNOSIS — E669 Obesity, unspecified: Secondary | ICD-10-CM | POA: Diagnosis not present

## 2016-08-01 DIAGNOSIS — L97511 Non-pressure chronic ulcer of other part of right foot limited to breakdown of skin: Secondary | ICD-10-CM | POA: Diagnosis not present

## 2016-08-01 DIAGNOSIS — I255 Ischemic cardiomyopathy: Secondary | ICD-10-CM | POA: Diagnosis not present

## 2016-08-01 DIAGNOSIS — E1165 Type 2 diabetes mellitus with hyperglycemia: Secondary | ICD-10-CM | POA: Diagnosis not present

## 2016-08-01 DIAGNOSIS — I129 Hypertensive chronic kidney disease with stage 1 through stage 4 chronic kidney disease, or unspecified chronic kidney disease: Secondary | ICD-10-CM | POA: Diagnosis not present

## 2016-08-01 DIAGNOSIS — I69398 Other sequelae of cerebral infarction: Secondary | ICD-10-CM | POA: Diagnosis not present

## 2016-08-01 DIAGNOSIS — Z794 Long term (current) use of insulin: Secondary | ICD-10-CM | POA: Diagnosis not present

## 2016-08-01 DIAGNOSIS — M6281 Muscle weakness (generalized): Secondary | ICD-10-CM | POA: Diagnosis not present

## 2016-08-01 DIAGNOSIS — L97521 Non-pressure chronic ulcer of other part of left foot limited to breakdown of skin: Secondary | ICD-10-CM | POA: Diagnosis not present

## 2016-08-01 DIAGNOSIS — Z87891 Personal history of nicotine dependence: Secondary | ICD-10-CM | POA: Diagnosis not present

## 2016-08-01 DIAGNOSIS — N189 Chronic kidney disease, unspecified: Secondary | ICD-10-CM | POA: Diagnosis not present

## 2016-08-04 DIAGNOSIS — E113392 Type 2 diabetes mellitus with moderate nonproliferative diabetic retinopathy without macular edema, left eye: Secondary | ICD-10-CM | POA: Diagnosis not present

## 2016-08-04 DIAGNOSIS — H35373 Puckering of macula, bilateral: Secondary | ICD-10-CM | POA: Diagnosis not present

## 2016-08-04 DIAGNOSIS — H43813 Vitreous degeneration, bilateral: Secondary | ICD-10-CM | POA: Diagnosis not present

## 2016-08-04 DIAGNOSIS — E113491 Type 2 diabetes mellitus with severe nonproliferative diabetic retinopathy without macular edema, right eye: Secondary | ICD-10-CM | POA: Diagnosis not present

## 2016-08-05 DIAGNOSIS — Z79899 Other long term (current) drug therapy: Secondary | ICD-10-CM | POA: Diagnosis not present

## 2016-08-05 DIAGNOSIS — E1169 Type 2 diabetes mellitus with other specified complication: Secondary | ICD-10-CM | POA: Diagnosis not present

## 2016-08-05 DIAGNOSIS — Z8673 Personal history of transient ischemic attack (TIA), and cerebral infarction without residual deficits: Secondary | ICD-10-CM | POA: Diagnosis not present

## 2016-08-05 DIAGNOSIS — L97514 Non-pressure chronic ulcer of other part of right foot with necrosis of bone: Secondary | ICD-10-CM | POA: Diagnosis not present

## 2016-08-05 DIAGNOSIS — M869 Osteomyelitis, unspecified: Secondary | ICD-10-CM | POA: Diagnosis not present

## 2016-08-05 DIAGNOSIS — M86671 Other chronic osteomyelitis, right ankle and foot: Secondary | ICD-10-CM | POA: Diagnosis not present

## 2016-08-05 DIAGNOSIS — I10 Essential (primary) hypertension: Secondary | ICD-10-CM | POA: Diagnosis not present

## 2016-08-05 DIAGNOSIS — M868X7 Other osteomyelitis, ankle and foot: Secondary | ICD-10-CM | POA: Diagnosis not present

## 2016-08-09 DIAGNOSIS — L97511 Non-pressure chronic ulcer of other part of right foot limited to breakdown of skin: Secondary | ICD-10-CM | POA: Diagnosis not present

## 2016-08-09 DIAGNOSIS — M6281 Muscle weakness (generalized): Secondary | ICD-10-CM | POA: Diagnosis not present

## 2016-08-09 DIAGNOSIS — E1165 Type 2 diabetes mellitus with hyperglycemia: Secondary | ICD-10-CM | POA: Diagnosis not present

## 2016-08-09 DIAGNOSIS — I69398 Other sequelae of cerebral infarction: Secondary | ICD-10-CM | POA: Diagnosis not present

## 2016-08-09 DIAGNOSIS — L97521 Non-pressure chronic ulcer of other part of left foot limited to breakdown of skin: Secondary | ICD-10-CM | POA: Diagnosis not present

## 2016-08-09 DIAGNOSIS — E11621 Type 2 diabetes mellitus with foot ulcer: Secondary | ICD-10-CM | POA: Diagnosis not present

## 2016-08-10 DIAGNOSIS — E11621 Type 2 diabetes mellitus with foot ulcer: Secondary | ICD-10-CM | POA: Diagnosis not present

## 2016-08-10 DIAGNOSIS — E11649 Type 2 diabetes mellitus with hypoglycemia without coma: Secondary | ICD-10-CM | POA: Diagnosis not present

## 2016-08-10 DIAGNOSIS — L97514 Non-pressure chronic ulcer of other part of right foot with necrosis of bone: Secondary | ICD-10-CM | POA: Diagnosis not present

## 2016-08-13 DIAGNOSIS — L97511 Non-pressure chronic ulcer of other part of right foot limited to breakdown of skin: Secondary | ICD-10-CM | POA: Diagnosis not present

## 2016-08-13 DIAGNOSIS — L97521 Non-pressure chronic ulcer of other part of left foot limited to breakdown of skin: Secondary | ICD-10-CM | POA: Diagnosis not present

## 2016-08-13 DIAGNOSIS — M6281 Muscle weakness (generalized): Secondary | ICD-10-CM | POA: Diagnosis not present

## 2016-08-13 DIAGNOSIS — E1165 Type 2 diabetes mellitus with hyperglycemia: Secondary | ICD-10-CM | POA: Diagnosis not present

## 2016-08-13 DIAGNOSIS — E11621 Type 2 diabetes mellitus with foot ulcer: Secondary | ICD-10-CM | POA: Diagnosis not present

## 2016-08-13 DIAGNOSIS — I69398 Other sequelae of cerebral infarction: Secondary | ICD-10-CM | POA: Diagnosis not present

## 2016-08-16 DIAGNOSIS — L97511 Non-pressure chronic ulcer of other part of right foot limited to breakdown of skin: Secondary | ICD-10-CM | POA: Diagnosis not present

## 2016-08-16 DIAGNOSIS — I69398 Other sequelae of cerebral infarction: Secondary | ICD-10-CM | POA: Diagnosis not present

## 2016-08-16 DIAGNOSIS — E11621 Type 2 diabetes mellitus with foot ulcer: Secondary | ICD-10-CM | POA: Diagnosis not present

## 2016-08-16 DIAGNOSIS — M6281 Muscle weakness (generalized): Secondary | ICD-10-CM | POA: Diagnosis not present

## 2016-08-16 DIAGNOSIS — E1165 Type 2 diabetes mellitus with hyperglycemia: Secondary | ICD-10-CM | POA: Diagnosis not present

## 2016-08-16 DIAGNOSIS — L97521 Non-pressure chronic ulcer of other part of left foot limited to breakdown of skin: Secondary | ICD-10-CM | POA: Diagnosis not present

## 2016-08-18 DIAGNOSIS — L97514 Non-pressure chronic ulcer of other part of right foot with necrosis of bone: Secondary | ICD-10-CM | POA: Diagnosis not present

## 2016-08-18 DIAGNOSIS — E11649 Type 2 diabetes mellitus with hypoglycemia without coma: Secondary | ICD-10-CM | POA: Diagnosis not present

## 2016-08-18 DIAGNOSIS — L97521 Non-pressure chronic ulcer of other part of left foot limited to breakdown of skin: Secondary | ICD-10-CM | POA: Diagnosis not present

## 2016-08-18 DIAGNOSIS — E1165 Type 2 diabetes mellitus with hyperglycemia: Secondary | ICD-10-CM | POA: Diagnosis not present

## 2016-08-18 DIAGNOSIS — I69398 Other sequelae of cerebral infarction: Secondary | ICD-10-CM | POA: Diagnosis not present

## 2016-08-18 DIAGNOSIS — E11621 Type 2 diabetes mellitus with foot ulcer: Secondary | ICD-10-CM | POA: Diagnosis not present

## 2016-08-18 DIAGNOSIS — L97511 Non-pressure chronic ulcer of other part of right foot limited to breakdown of skin: Secondary | ICD-10-CM | POA: Diagnosis not present

## 2016-08-18 DIAGNOSIS — M6281 Muscle weakness (generalized): Secondary | ICD-10-CM | POA: Diagnosis not present

## 2016-08-19 DIAGNOSIS — L97514 Non-pressure chronic ulcer of other part of right foot with necrosis of bone: Secondary | ICD-10-CM | POA: Diagnosis not present

## 2016-08-19 DIAGNOSIS — E11621 Type 2 diabetes mellitus with foot ulcer: Secondary | ICD-10-CM | POA: Diagnosis not present

## 2016-08-19 DIAGNOSIS — E11649 Type 2 diabetes mellitus with hypoglycemia without coma: Secondary | ICD-10-CM | POA: Diagnosis not present

## 2016-08-20 DIAGNOSIS — E1165 Type 2 diabetes mellitus with hyperglycemia: Secondary | ICD-10-CM | POA: Diagnosis not present

## 2016-08-20 DIAGNOSIS — M6281 Muscle weakness (generalized): Secondary | ICD-10-CM | POA: Diagnosis not present

## 2016-08-20 DIAGNOSIS — L97511 Non-pressure chronic ulcer of other part of right foot limited to breakdown of skin: Secondary | ICD-10-CM | POA: Diagnosis not present

## 2016-08-20 DIAGNOSIS — E11621 Type 2 diabetes mellitus with foot ulcer: Secondary | ICD-10-CM | POA: Diagnosis not present

## 2016-08-20 DIAGNOSIS — L97521 Non-pressure chronic ulcer of other part of left foot limited to breakdown of skin: Secondary | ICD-10-CM | POA: Diagnosis not present

## 2016-08-20 DIAGNOSIS — I69398 Other sequelae of cerebral infarction: Secondary | ICD-10-CM | POA: Diagnosis not present

## 2016-08-23 DIAGNOSIS — M6281 Muscle weakness (generalized): Secondary | ICD-10-CM | POA: Diagnosis not present

## 2016-08-23 DIAGNOSIS — E11621 Type 2 diabetes mellitus with foot ulcer: Secondary | ICD-10-CM | POA: Diagnosis not present

## 2016-08-23 DIAGNOSIS — E1165 Type 2 diabetes mellitus with hyperglycemia: Secondary | ICD-10-CM | POA: Diagnosis not present

## 2016-08-23 DIAGNOSIS — I69398 Other sequelae of cerebral infarction: Secondary | ICD-10-CM | POA: Diagnosis not present

## 2016-08-23 DIAGNOSIS — L97511 Non-pressure chronic ulcer of other part of right foot limited to breakdown of skin: Secondary | ICD-10-CM | POA: Diagnosis not present

## 2016-08-23 DIAGNOSIS — L97521 Non-pressure chronic ulcer of other part of left foot limited to breakdown of skin: Secondary | ICD-10-CM | POA: Diagnosis not present

## 2016-08-24 DIAGNOSIS — E11621 Type 2 diabetes mellitus with foot ulcer: Secondary | ICD-10-CM | POA: Diagnosis not present

## 2016-08-24 DIAGNOSIS — E11649 Type 2 diabetes mellitus with hypoglycemia without coma: Secondary | ICD-10-CM | POA: Diagnosis not present

## 2016-08-24 DIAGNOSIS — L97514 Non-pressure chronic ulcer of other part of right foot with necrosis of bone: Secondary | ICD-10-CM | POA: Diagnosis not present

## 2016-08-25 DIAGNOSIS — E11621 Type 2 diabetes mellitus with foot ulcer: Secondary | ICD-10-CM | POA: Diagnosis not present

## 2016-08-25 DIAGNOSIS — E11649 Type 2 diabetes mellitus with hypoglycemia without coma: Secondary | ICD-10-CM | POA: Diagnosis not present

## 2016-08-25 DIAGNOSIS — L97514 Non-pressure chronic ulcer of other part of right foot with necrosis of bone: Secondary | ICD-10-CM | POA: Diagnosis not present

## 2016-08-27 DIAGNOSIS — E11621 Type 2 diabetes mellitus with foot ulcer: Secondary | ICD-10-CM | POA: Diagnosis not present

## 2016-08-27 DIAGNOSIS — L97521 Non-pressure chronic ulcer of other part of left foot limited to breakdown of skin: Secondary | ICD-10-CM | POA: Diagnosis not present

## 2016-08-27 DIAGNOSIS — L97511 Non-pressure chronic ulcer of other part of right foot limited to breakdown of skin: Secondary | ICD-10-CM | POA: Diagnosis not present

## 2016-08-27 DIAGNOSIS — I69398 Other sequelae of cerebral infarction: Secondary | ICD-10-CM | POA: Diagnosis not present

## 2016-08-27 DIAGNOSIS — E1165 Type 2 diabetes mellitus with hyperglycemia: Secondary | ICD-10-CM | POA: Diagnosis not present

## 2016-08-27 DIAGNOSIS — M6281 Muscle weakness (generalized): Secondary | ICD-10-CM | POA: Diagnosis not present

## 2016-08-30 ENCOUNTER — Ambulatory Visit: Payer: Medicare Other | Admitting: Internal Medicine

## 2016-08-30 DIAGNOSIS — Z299 Encounter for prophylactic measures, unspecified: Secondary | ICD-10-CM | POA: Diagnosis not present

## 2016-08-30 DIAGNOSIS — R609 Edema, unspecified: Secondary | ICD-10-CM | POA: Diagnosis not present

## 2016-08-30 DIAGNOSIS — M6281 Muscle weakness (generalized): Secondary | ICD-10-CM | POA: Diagnosis not present

## 2016-08-30 DIAGNOSIS — E1165 Type 2 diabetes mellitus with hyperglycemia: Secondary | ICD-10-CM | POA: Diagnosis not present

## 2016-08-30 DIAGNOSIS — Z6832 Body mass index (BMI) 32.0-32.9, adult: Secondary | ICD-10-CM | POA: Diagnosis not present

## 2016-08-30 DIAGNOSIS — I10 Essential (primary) hypertension: Secondary | ICD-10-CM | POA: Diagnosis not present

## 2016-08-30 DIAGNOSIS — I69398 Other sequelae of cerebral infarction: Secondary | ICD-10-CM | POA: Diagnosis not present

## 2016-08-30 DIAGNOSIS — L97511 Non-pressure chronic ulcer of other part of right foot limited to breakdown of skin: Secondary | ICD-10-CM | POA: Diagnosis not present

## 2016-08-30 DIAGNOSIS — L97521 Non-pressure chronic ulcer of other part of left foot limited to breakdown of skin: Secondary | ICD-10-CM | POA: Diagnosis not present

## 2016-08-30 DIAGNOSIS — E11621 Type 2 diabetes mellitus with foot ulcer: Secondary | ICD-10-CM | POA: Diagnosis not present

## 2016-08-30 DIAGNOSIS — E7800 Pure hypercholesterolemia, unspecified: Secondary | ICD-10-CM | POA: Diagnosis not present

## 2016-08-30 DIAGNOSIS — E1122 Type 2 diabetes mellitus with diabetic chronic kidney disease: Secondary | ICD-10-CM | POA: Diagnosis not present

## 2016-08-31 DIAGNOSIS — L97514 Non-pressure chronic ulcer of other part of right foot with necrosis of bone: Secondary | ICD-10-CM | POA: Diagnosis not present

## 2016-08-31 DIAGNOSIS — E11621 Type 2 diabetes mellitus with foot ulcer: Secondary | ICD-10-CM | POA: Diagnosis not present

## 2016-08-31 DIAGNOSIS — E11649 Type 2 diabetes mellitus with hypoglycemia without coma: Secondary | ICD-10-CM | POA: Diagnosis not present

## 2016-09-01 DIAGNOSIS — E1165 Type 2 diabetes mellitus with hyperglycemia: Secondary | ICD-10-CM | POA: Diagnosis not present

## 2016-09-01 DIAGNOSIS — L97511 Non-pressure chronic ulcer of other part of right foot limited to breakdown of skin: Secondary | ICD-10-CM | POA: Diagnosis not present

## 2016-09-01 DIAGNOSIS — I69398 Other sequelae of cerebral infarction: Secondary | ICD-10-CM | POA: Diagnosis not present

## 2016-09-01 DIAGNOSIS — M6281 Muscle weakness (generalized): Secondary | ICD-10-CM | POA: Diagnosis not present

## 2016-09-01 DIAGNOSIS — L97521 Non-pressure chronic ulcer of other part of left foot limited to breakdown of skin: Secondary | ICD-10-CM | POA: Diagnosis not present

## 2016-09-01 DIAGNOSIS — E11621 Type 2 diabetes mellitus with foot ulcer: Secondary | ICD-10-CM | POA: Diagnosis not present

## 2016-09-01 DIAGNOSIS — E11649 Type 2 diabetes mellitus with hypoglycemia without coma: Secondary | ICD-10-CM | POA: Diagnosis not present

## 2016-09-01 DIAGNOSIS — L97514 Non-pressure chronic ulcer of other part of right foot with necrosis of bone: Secondary | ICD-10-CM | POA: Diagnosis not present

## 2016-09-02 DIAGNOSIS — E11621 Type 2 diabetes mellitus with foot ulcer: Secondary | ICD-10-CM | POA: Diagnosis not present

## 2016-09-02 DIAGNOSIS — E11649 Type 2 diabetes mellitus with hypoglycemia without coma: Secondary | ICD-10-CM | POA: Diagnosis not present

## 2016-09-02 DIAGNOSIS — L97514 Non-pressure chronic ulcer of other part of right foot with necrosis of bone: Secondary | ICD-10-CM | POA: Diagnosis not present

## 2016-09-03 DIAGNOSIS — L97521 Non-pressure chronic ulcer of other part of left foot limited to breakdown of skin: Secondary | ICD-10-CM | POA: Diagnosis not present

## 2016-09-03 DIAGNOSIS — E11621 Type 2 diabetes mellitus with foot ulcer: Secondary | ICD-10-CM | POA: Diagnosis not present

## 2016-09-03 DIAGNOSIS — I69398 Other sequelae of cerebral infarction: Secondary | ICD-10-CM | POA: Diagnosis not present

## 2016-09-03 DIAGNOSIS — L97511 Non-pressure chronic ulcer of other part of right foot limited to breakdown of skin: Secondary | ICD-10-CM | POA: Diagnosis not present

## 2016-09-03 DIAGNOSIS — E1165 Type 2 diabetes mellitus with hyperglycemia: Secondary | ICD-10-CM | POA: Diagnosis not present

## 2016-09-03 DIAGNOSIS — M6281 Muscle weakness (generalized): Secondary | ICD-10-CM | POA: Diagnosis not present

## 2016-09-06 ENCOUNTER — Encounter: Payer: Self-pay | Admitting: Internal Medicine

## 2016-09-06 ENCOUNTER — Ambulatory Visit (INDEPENDENT_AMBULATORY_CARE_PROVIDER_SITE_OTHER): Payer: Medicare Other | Admitting: Internal Medicine

## 2016-09-06 DIAGNOSIS — M869 Osteomyelitis, unspecified: Secondary | ICD-10-CM | POA: Diagnosis not present

## 2016-09-06 NOTE — Progress Notes (Signed)
[facility]  Patient Active Problem List   Diagnosis Date Noted  . Osteomyelitis of toe of left foot (HCC) 07/26/2016    Priority: High  . Diabetes mellitus (HCC) 07/26/2016  . Hypertension 07/26/2016  . Coronary artery disease 07/26/2016  . History of CVA (cerebrovascular accident) 07/26/2016  . Atrial fibrillation (HCC) 07/26/2016  . Dyslipidemia 07/26/2016  . Former cigarette smoker 07/26/2016    Patient's Medications  New Prescriptions   No medications on file  Previous Medications   AMLODIPINE (NORVASC) 10 MG TABLET    Take 10 mg by mouth daily.   ASPIRIN EC 81 MG TABLET    Take 81 mg by mouth daily.   ATENOLOL (TENORMIN) 100 MG TABLET    Take 100 mg by mouth daily.   ATORVASTATIN (LIPITOR) 40 MG TABLET    Take 40 mg by mouth daily.   CARVEDILOL (COREG) 3.125 MG TABLET    Take by mouth.   CLOPIDOGREL (PLAVIX) 75 MG TABLET    Take by mouth.   DOXYCYCLINE (VIBRAMYCIN) 100 MG CAPSULE    Take 1 capsule (100 mg total) by mouth 2 (two) times daily.   EMPAGLIFLOZIN (JARDIANCE) 10 MG TABS TABLET    Take by mouth.   INSULIN DETEMIR (LEVEMIR) 100 UNIT/ML INJECTION    Inject 40 Units into the skin daily.   LISINOPRIL (PRINIVIL,ZESTRIL) 10 MG TABLET    Take 10 mg by mouth daily.   METFORMIN (GLUCOPHAGE) 1000 MG TABLET    Take 1,000 mg by mouth 2 (two) times daily with a meal.   PIOGLITAZONE (ACTOS) 30 MG TABLET    Take 30 mg by mouth daily.   TERBINAFINE HCL (LAMISIL PO)    Take by mouth.  Modified Medications   No medications on file  Discontinued Medications   No medications on file    Subjective: Matthew Mccormick is in for his routine follow-up visit. He is accompanied by his cousin, [name]. I saw him about 6 weeks ago because of concern that he had MRSA osteomyelitis of his left great toe. I treated him with 4 weeks of oral doxycycline. He had no problem tolerating the antibiotic while he was on it. He did have one episode of diarrhea this  morning. He is not having any pain or swelling in his left foot. He has been bothered by swelling in his right foot.  Review of Systems: Review of Systems  Constitutional: Negative for chills, diaphoresis and fever.  Gastrointestinal: Positive for diarrhea. Negative for abdominal pain, nausea and vomiting.  Musculoskeletal: Negative for joint pain.    Past Medical History:  Diagnosis Date  . 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 Chronic kidney disease   . Diabetes mellitus without complication (HCC)   . Hyperlipidemia   . Hypertension   . Slow rate of speech   . Stroke [facility])    1 year ago-left and shaky and unsteady on feet.    Social History  Substance Use Topics  . Smoking status: Former Smoker    Packs/day: 1.00    Years: 1.00    Types: Cigarettes    Quit date: 06/16/1991  . Smokeless tobacco: Never Used  . Alcohol use No    No family history on file.  No Known Allergies  Objective: Vitals:   09/06/16 1451  BP: 134/89  Pulse: (!) 114  Temp: 98.4 F (36.9 C)  TempSrc: Oral  Weight: 224 lb  12.8 oz (102 kg)   Body mass index is 31.35 kg/m.  Physical Exam  Constitutional:  He is in no distress. He defers to [facility] and lets her answer all questions.  Abdominal: Soft. There is no tenderness.  Musculoskeletal:  There is a small superficial scab on the medial aspect of his left great toe. There is no surrounding cellulitis, warmth, fluctuance or drainage. It is nontender.  There is some ulceration between his right second and third toe. There is some serous drainage on a gauze dressing. There is no odor or cellulitis. He has edema of his right lower leg.    Lab Results Sed Rate (mm/hr)  Date Value  07/27/2016 1   CRP (mg/L)  Date Value  07/27/2016 1.3     Problem List Items Addressed This Visit      High   Osteomyelitis of toe of left foot (HCC)    I'm hopeful that the small area of osteomyelitis in his left great toe has been cured. I  will have him stay off of antibiotics. He can follow-up here as needed. He is currently being seen at the wound center.          Cliffton Asters, MD [facility] [phone] pager   [phone] cell 09/06/2016, 3:24 PM

## 2016-09-06 NOTE — Assessment & Plan Note (Signed)
I'm hopeful that the small area of osteomyelitis in his left great toe has been cured. I will have him stay off of antibiotics. He can follow-up here as needed. He is currently being seen at the wound center.

## 2016-09-07 DIAGNOSIS — L97514 Non-pressure chronic ulcer of other part of right foot with necrosis of bone: Secondary | ICD-10-CM | POA: Diagnosis not present

## 2016-09-07 DIAGNOSIS — E11621 Type 2 diabetes mellitus with foot ulcer: Secondary | ICD-10-CM | POA: Diagnosis not present

## 2016-09-07 DIAGNOSIS — E11649 Type 2 diabetes mellitus with hypoglycemia without coma: Secondary | ICD-10-CM | POA: Diagnosis not present

## 2016-09-08 DIAGNOSIS — E1165 Type 2 diabetes mellitus with hyperglycemia: Secondary | ICD-10-CM | POA: Diagnosis not present

## 2016-09-08 DIAGNOSIS — L97514 Non-pressure chronic ulcer of other part of right foot with necrosis of bone: Secondary | ICD-10-CM | POA: Diagnosis not present

## 2016-09-08 DIAGNOSIS — L97511 Non-pressure chronic ulcer of other part of right foot limited to breakdown of skin: Secondary | ICD-10-CM | POA: Diagnosis not present

## 2016-09-08 DIAGNOSIS — L97521 Non-pressure chronic ulcer of other part of left foot limited to breakdown of skin: Secondary | ICD-10-CM | POA: Diagnosis not present

## 2016-09-08 DIAGNOSIS — E11621 Type 2 diabetes mellitus with foot ulcer: Secondary | ICD-10-CM | POA: Diagnosis not present

## 2016-09-08 DIAGNOSIS — M6281 Muscle weakness (generalized): Secondary | ICD-10-CM | POA: Diagnosis not present

## 2016-09-08 DIAGNOSIS — I69398 Other sequelae of cerebral infarction: Secondary | ICD-10-CM | POA: Diagnosis not present

## 2016-09-08 DIAGNOSIS — L97515 Non-pressure chronic ulcer of other part of right foot with muscle involvement without evidence of necrosis: Secondary | ICD-10-CM | POA: Diagnosis not present

## 2016-09-08 DIAGNOSIS — I872 Venous insufficiency (chronic) (peripheral): Secondary | ICD-10-CM | POA: Diagnosis not present

## 2016-09-10 DIAGNOSIS — I69398 Other sequelae of cerebral infarction: Secondary | ICD-10-CM | POA: Diagnosis not present

## 2016-09-10 DIAGNOSIS — M6281 Muscle weakness (generalized): Secondary | ICD-10-CM | POA: Diagnosis not present

## 2016-09-10 DIAGNOSIS — E1165 Type 2 diabetes mellitus with hyperglycemia: Secondary | ICD-10-CM | POA: Diagnosis not present

## 2016-09-10 DIAGNOSIS — L97521 Non-pressure chronic ulcer of other part of left foot limited to breakdown of skin: Secondary | ICD-10-CM | POA: Diagnosis not present

## 2016-09-10 DIAGNOSIS — E11621 Type 2 diabetes mellitus with foot ulcer: Secondary | ICD-10-CM | POA: Diagnosis not present

## 2016-09-10 DIAGNOSIS — L97511 Non-pressure chronic ulcer of other part of right foot limited to breakdown of skin: Secondary | ICD-10-CM | POA: Diagnosis not present

## 2016-09-13 DIAGNOSIS — I739 Peripheral vascular disease, unspecified: Secondary | ICD-10-CM | POA: Diagnosis not present

## 2016-09-13 DIAGNOSIS — M6281 Muscle weakness (generalized): Secondary | ICD-10-CM | POA: Diagnosis not present

## 2016-09-13 DIAGNOSIS — E1165 Type 2 diabetes mellitus with hyperglycemia: Secondary | ICD-10-CM | POA: Diagnosis not present

## 2016-09-13 DIAGNOSIS — Z6833 Body mass index (BMI) 33.0-33.9, adult: Secondary | ICD-10-CM | POA: Diagnosis not present

## 2016-09-13 DIAGNOSIS — I69398 Other sequelae of cerebral infarction: Secondary | ICD-10-CM | POA: Diagnosis not present

## 2016-09-13 DIAGNOSIS — E7800 Pure hypercholesterolemia, unspecified: Secondary | ICD-10-CM | POA: Diagnosis not present

## 2016-09-13 DIAGNOSIS — E11621 Type 2 diabetes mellitus with foot ulcer: Secondary | ICD-10-CM | POA: Diagnosis not present

## 2016-09-13 DIAGNOSIS — E1122 Type 2 diabetes mellitus with diabetic chronic kidney disease: Secondary | ICD-10-CM | POA: Diagnosis not present

## 2016-09-13 DIAGNOSIS — Z713 Dietary counseling and surveillance: Secondary | ICD-10-CM | POA: Diagnosis not present

## 2016-09-13 DIAGNOSIS — I10 Essential (primary) hypertension: Secondary | ICD-10-CM | POA: Diagnosis not present

## 2016-09-13 DIAGNOSIS — Z299 Encounter for prophylactic measures, unspecified: Secondary | ICD-10-CM | POA: Diagnosis not present

## 2016-09-13 DIAGNOSIS — L97529 Non-pressure chronic ulcer of other part of left foot with unspecified severity: Secondary | ICD-10-CM | POA: Diagnosis not present

## 2016-09-13 DIAGNOSIS — L97521 Non-pressure chronic ulcer of other part of left foot limited to breakdown of skin: Secondary | ICD-10-CM | POA: Diagnosis not present

## 2016-09-13 DIAGNOSIS — R609 Edema, unspecified: Secondary | ICD-10-CM | POA: Diagnosis not present

## 2016-09-13 DIAGNOSIS — L97511 Non-pressure chronic ulcer of other part of right foot limited to breakdown of skin: Secondary | ICD-10-CM | POA: Diagnosis not present

## 2016-09-14 DIAGNOSIS — L97514 Non-pressure chronic ulcer of other part of right foot with necrosis of bone: Secondary | ICD-10-CM | POA: Diagnosis not present

## 2016-09-14 DIAGNOSIS — L97515 Non-pressure chronic ulcer of other part of right foot with muscle involvement without evidence of necrosis: Secondary | ICD-10-CM | POA: Diagnosis not present

## 2016-09-14 DIAGNOSIS — I872 Venous insufficiency (chronic) (peripheral): Secondary | ICD-10-CM | POA: Diagnosis not present

## 2016-09-14 DIAGNOSIS — E11621 Type 2 diabetes mellitus with foot ulcer: Secondary | ICD-10-CM | POA: Diagnosis not present

## 2016-09-15 DIAGNOSIS — L97511 Non-pressure chronic ulcer of other part of right foot limited to breakdown of skin: Secondary | ICD-10-CM | POA: Diagnosis not present

## 2016-09-15 DIAGNOSIS — L97521 Non-pressure chronic ulcer of other part of left foot limited to breakdown of skin: Secondary | ICD-10-CM | POA: Diagnosis not present

## 2016-09-15 DIAGNOSIS — I69398 Other sequelae of cerebral infarction: Secondary | ICD-10-CM | POA: Diagnosis not present

## 2016-09-15 DIAGNOSIS — E11621 Type 2 diabetes mellitus with foot ulcer: Secondary | ICD-10-CM | POA: Diagnosis not present

## 2016-09-15 DIAGNOSIS — M6281 Muscle weakness (generalized): Secondary | ICD-10-CM | POA: Diagnosis not present

## 2016-09-15 DIAGNOSIS — E1165 Type 2 diabetes mellitus with hyperglycemia: Secondary | ICD-10-CM | POA: Diagnosis not present

## 2016-09-17 DIAGNOSIS — M6281 Muscle weakness (generalized): Secondary | ICD-10-CM | POA: Diagnosis not present

## 2016-09-17 DIAGNOSIS — L97521 Non-pressure chronic ulcer of other part of left foot limited to breakdown of skin: Secondary | ICD-10-CM | POA: Diagnosis not present

## 2016-09-17 DIAGNOSIS — L97511 Non-pressure chronic ulcer of other part of right foot limited to breakdown of skin: Secondary | ICD-10-CM | POA: Diagnosis not present

## 2016-09-17 DIAGNOSIS — E1165 Type 2 diabetes mellitus with hyperglycemia: Secondary | ICD-10-CM | POA: Diagnosis not present

## 2016-09-17 DIAGNOSIS — I69398 Other sequelae of cerebral infarction: Secondary | ICD-10-CM | POA: Diagnosis not present

## 2016-09-17 DIAGNOSIS — E11621 Type 2 diabetes mellitus with foot ulcer: Secondary | ICD-10-CM | POA: Diagnosis not present

## 2016-09-20 DIAGNOSIS — I69398 Other sequelae of cerebral infarction: Secondary | ICD-10-CM | POA: Diagnosis not present

## 2016-09-20 DIAGNOSIS — L97521 Non-pressure chronic ulcer of other part of left foot limited to breakdown of skin: Secondary | ICD-10-CM | POA: Diagnosis not present

## 2016-09-20 DIAGNOSIS — E11621 Type 2 diabetes mellitus with foot ulcer: Secondary | ICD-10-CM | POA: Diagnosis not present

## 2016-09-20 DIAGNOSIS — M6281 Muscle weakness (generalized): Secondary | ICD-10-CM | POA: Diagnosis not present

## 2016-09-20 DIAGNOSIS — E1165 Type 2 diabetes mellitus with hyperglycemia: Secondary | ICD-10-CM | POA: Diagnosis not present

## 2016-09-20 DIAGNOSIS — L97511 Non-pressure chronic ulcer of other part of right foot limited to breakdown of skin: Secondary | ICD-10-CM | POA: Diagnosis not present

## 2016-09-21 DIAGNOSIS — L97514 Non-pressure chronic ulcer of other part of right foot with necrosis of bone: Secondary | ICD-10-CM | POA: Diagnosis not present

## 2016-09-21 DIAGNOSIS — I872 Venous insufficiency (chronic) (peripheral): Secondary | ICD-10-CM | POA: Diagnosis not present

## 2016-09-21 DIAGNOSIS — E11621 Type 2 diabetes mellitus with foot ulcer: Secondary | ICD-10-CM | POA: Diagnosis not present

## 2016-09-21 DIAGNOSIS — L97515 Non-pressure chronic ulcer of other part of right foot with muscle involvement without evidence of necrosis: Secondary | ICD-10-CM | POA: Diagnosis not present

## 2016-09-22 DIAGNOSIS — K3184 Gastroparesis: Secondary | ICD-10-CM | POA: Diagnosis not present

## 2016-09-22 DIAGNOSIS — I69398 Other sequelae of cerebral infarction: Secondary | ICD-10-CM | POA: Diagnosis not present

## 2016-09-22 DIAGNOSIS — M6281 Muscle weakness (generalized): Secondary | ICD-10-CM | POA: Diagnosis not present

## 2016-09-22 DIAGNOSIS — L97511 Non-pressure chronic ulcer of other part of right foot limited to breakdown of skin: Secondary | ICD-10-CM | POA: Diagnosis not present

## 2016-09-22 DIAGNOSIS — L97521 Non-pressure chronic ulcer of other part of left foot limited to breakdown of skin: Secondary | ICD-10-CM | POA: Diagnosis not present

## 2016-09-22 DIAGNOSIS — E11621 Type 2 diabetes mellitus with foot ulcer: Secondary | ICD-10-CM | POA: Diagnosis not present

## 2016-09-22 DIAGNOSIS — E1165 Type 2 diabetes mellitus with hyperglycemia: Secondary | ICD-10-CM | POA: Diagnosis not present

## 2016-09-23 DIAGNOSIS — L97514 Non-pressure chronic ulcer of other part of right foot with necrosis of bone: Secondary | ICD-10-CM | POA: Diagnosis not present

## 2016-09-23 DIAGNOSIS — L97515 Non-pressure chronic ulcer of other part of right foot with muscle involvement without evidence of necrosis: Secondary | ICD-10-CM | POA: Diagnosis not present

## 2016-09-23 DIAGNOSIS — I872 Venous insufficiency (chronic) (peripheral): Secondary | ICD-10-CM | POA: Diagnosis not present

## 2016-09-23 DIAGNOSIS — E11621 Type 2 diabetes mellitus with foot ulcer: Secondary | ICD-10-CM | POA: Diagnosis not present

## 2016-09-24 DIAGNOSIS — L97511 Non-pressure chronic ulcer of other part of right foot limited to breakdown of skin: Secondary | ICD-10-CM | POA: Diagnosis not present

## 2016-09-24 DIAGNOSIS — E1165 Type 2 diabetes mellitus with hyperglycemia: Secondary | ICD-10-CM | POA: Diagnosis not present

## 2016-09-24 DIAGNOSIS — I69398 Other sequelae of cerebral infarction: Secondary | ICD-10-CM | POA: Diagnosis not present

## 2016-09-24 DIAGNOSIS — L97521 Non-pressure chronic ulcer of other part of left foot limited to breakdown of skin: Secondary | ICD-10-CM | POA: Diagnosis not present

## 2016-09-24 DIAGNOSIS — M6281 Muscle weakness (generalized): Secondary | ICD-10-CM | POA: Diagnosis not present

## 2016-09-24 DIAGNOSIS — E11621 Type 2 diabetes mellitus with foot ulcer: Secondary | ICD-10-CM | POA: Diagnosis not present

## 2016-09-27 DIAGNOSIS — E1165 Type 2 diabetes mellitus with hyperglycemia: Secondary | ICD-10-CM | POA: Diagnosis not present

## 2016-09-27 DIAGNOSIS — E11621 Type 2 diabetes mellitus with foot ulcer: Secondary | ICD-10-CM | POA: Diagnosis not present

## 2016-09-27 DIAGNOSIS — L97511 Non-pressure chronic ulcer of other part of right foot limited to breakdown of skin: Secondary | ICD-10-CM | POA: Diagnosis not present

## 2016-09-27 DIAGNOSIS — L97521 Non-pressure chronic ulcer of other part of left foot limited to breakdown of skin: Secondary | ICD-10-CM | POA: Diagnosis not present

## 2016-09-27 DIAGNOSIS — M6281 Muscle weakness (generalized): Secondary | ICD-10-CM | POA: Diagnosis not present

## 2016-09-27 DIAGNOSIS — I69398 Other sequelae of cerebral infarction: Secondary | ICD-10-CM | POA: Diagnosis not present

## 2016-09-28 DIAGNOSIS — L97515 Non-pressure chronic ulcer of other part of right foot with muscle involvement without evidence of necrosis: Secondary | ICD-10-CM | POA: Diagnosis not present

## 2016-09-28 DIAGNOSIS — L97514 Non-pressure chronic ulcer of other part of right foot with necrosis of bone: Secondary | ICD-10-CM | POA: Diagnosis not present

## 2016-09-28 DIAGNOSIS — I872 Venous insufficiency (chronic) (peripheral): Secondary | ICD-10-CM | POA: Diagnosis not present

## 2016-09-28 DIAGNOSIS — E11621 Type 2 diabetes mellitus with foot ulcer: Secondary | ICD-10-CM | POA: Diagnosis not present

## 2016-09-29 DIAGNOSIS — E11621 Type 2 diabetes mellitus with foot ulcer: Secondary | ICD-10-CM | POA: Diagnosis not present

## 2016-09-29 DIAGNOSIS — I69398 Other sequelae of cerebral infarction: Secondary | ICD-10-CM | POA: Diagnosis not present

## 2016-09-29 DIAGNOSIS — E1165 Type 2 diabetes mellitus with hyperglycemia: Secondary | ICD-10-CM | POA: Diagnosis not present

## 2016-09-29 DIAGNOSIS — M6281 Muscle weakness (generalized): Secondary | ICD-10-CM | POA: Diagnosis not present

## 2016-09-29 DIAGNOSIS — L97521 Non-pressure chronic ulcer of other part of left foot limited to breakdown of skin: Secondary | ICD-10-CM | POA: Diagnosis not present

## 2016-09-29 DIAGNOSIS — L97511 Non-pressure chronic ulcer of other part of right foot limited to breakdown of skin: Secondary | ICD-10-CM | POA: Diagnosis not present

## 2016-09-29 DIAGNOSIS — I872 Venous insufficiency (chronic) (peripheral): Secondary | ICD-10-CM | POA: Diagnosis not present

## 2016-09-29 DIAGNOSIS — L97514 Non-pressure chronic ulcer of other part of right foot with necrosis of bone: Secondary | ICD-10-CM | POA: Diagnosis not present

## 2016-09-29 DIAGNOSIS — L97515 Non-pressure chronic ulcer of other part of right foot with muscle involvement without evidence of necrosis: Secondary | ICD-10-CM | POA: Diagnosis not present

## 2016-09-30 DIAGNOSIS — E785 Hyperlipidemia, unspecified: Secondary | ICD-10-CM | POA: Diagnosis not present

## 2016-09-30 DIAGNOSIS — I129 Hypertensive chronic kidney disease with stage 1 through stage 4 chronic kidney disease, or unspecified chronic kidney disease: Secondary | ICD-10-CM | POA: Diagnosis not present

## 2016-09-30 DIAGNOSIS — E1165 Type 2 diabetes mellitus with hyperglycemia: Secondary | ICD-10-CM | POA: Diagnosis not present

## 2016-09-30 DIAGNOSIS — E11621 Type 2 diabetes mellitus with foot ulcer: Secondary | ICD-10-CM | POA: Diagnosis not present

## 2016-09-30 DIAGNOSIS — R4701 Aphasia: Secondary | ICD-10-CM | POA: Diagnosis not present

## 2016-09-30 DIAGNOSIS — L97511 Non-pressure chronic ulcer of other part of right foot limited to breakdown of skin: Secondary | ICD-10-CM | POA: Diagnosis not present

## 2016-09-30 DIAGNOSIS — M6281 Muscle weakness (generalized): Secondary | ICD-10-CM | POA: Diagnosis not present

## 2016-09-30 DIAGNOSIS — Z794 Long term (current) use of insulin: Secondary | ICD-10-CM | POA: Diagnosis not present

## 2016-09-30 DIAGNOSIS — N189 Chronic kidney disease, unspecified: Secondary | ICD-10-CM | POA: Diagnosis not present

## 2016-09-30 DIAGNOSIS — Z87891 Personal history of nicotine dependence: Secondary | ICD-10-CM | POA: Diagnosis not present

## 2016-09-30 DIAGNOSIS — E669 Obesity, unspecified: Secondary | ICD-10-CM | POA: Diagnosis not present

## 2016-09-30 DIAGNOSIS — I69398 Other sequelae of cerebral infarction: Secondary | ICD-10-CM | POA: Diagnosis not present

## 2016-09-30 DIAGNOSIS — I255 Ischemic cardiomyopathy: Secondary | ICD-10-CM | POA: Diagnosis not present

## 2016-10-01 DIAGNOSIS — I69398 Other sequelae of cerebral infarction: Secondary | ICD-10-CM | POA: Diagnosis not present

## 2016-10-01 DIAGNOSIS — I129 Hypertensive chronic kidney disease with stage 1 through stage 4 chronic kidney disease, or unspecified chronic kidney disease: Secondary | ICD-10-CM | POA: Diagnosis not present

## 2016-10-01 DIAGNOSIS — M6281 Muscle weakness (generalized): Secondary | ICD-10-CM | POA: Diagnosis not present

## 2016-10-01 DIAGNOSIS — L97511 Non-pressure chronic ulcer of other part of right foot limited to breakdown of skin: Secondary | ICD-10-CM | POA: Diagnosis not present

## 2016-10-01 DIAGNOSIS — E11621 Type 2 diabetes mellitus with foot ulcer: Secondary | ICD-10-CM | POA: Diagnosis not present

## 2016-10-01 DIAGNOSIS — E1165 Type 2 diabetes mellitus with hyperglycemia: Secondary | ICD-10-CM | POA: Diagnosis not present

## 2016-10-04 DIAGNOSIS — F419 Anxiety disorder, unspecified: Secondary | ICD-10-CM | POA: Diagnosis not present

## 2016-10-04 DIAGNOSIS — I10 Essential (primary) hypertension: Secondary | ICD-10-CM | POA: Diagnosis not present

## 2016-10-04 DIAGNOSIS — E11621 Type 2 diabetes mellitus with foot ulcer: Secondary | ICD-10-CM | POA: Diagnosis not present

## 2016-10-04 DIAGNOSIS — Z713 Dietary counseling and surveillance: Secondary | ICD-10-CM | POA: Diagnosis not present

## 2016-10-04 DIAGNOSIS — M6281 Muscle weakness (generalized): Secondary | ICD-10-CM | POA: Diagnosis not present

## 2016-10-04 DIAGNOSIS — Z6833 Body mass index (BMI) 33.0-33.9, adult: Secondary | ICD-10-CM | POA: Diagnosis not present

## 2016-10-04 DIAGNOSIS — E1165 Type 2 diabetes mellitus with hyperglycemia: Secondary | ICD-10-CM | POA: Diagnosis not present

## 2016-10-04 DIAGNOSIS — I739 Peripheral vascular disease, unspecified: Secondary | ICD-10-CM | POA: Diagnosis not present

## 2016-10-04 DIAGNOSIS — I69398 Other sequelae of cerebral infarction: Secondary | ICD-10-CM | POA: Diagnosis not present

## 2016-10-04 DIAGNOSIS — E7800 Pure hypercholesterolemia, unspecified: Secondary | ICD-10-CM | POA: Diagnosis not present

## 2016-10-04 DIAGNOSIS — Z299 Encounter for prophylactic measures, unspecified: Secondary | ICD-10-CM | POA: Diagnosis not present

## 2016-10-04 DIAGNOSIS — L97511 Non-pressure chronic ulcer of other part of right foot limited to breakdown of skin: Secondary | ICD-10-CM | POA: Diagnosis not present

## 2016-10-04 DIAGNOSIS — I129 Hypertensive chronic kidney disease with stage 1 through stage 4 chronic kidney disease, or unspecified chronic kidney disease: Secondary | ICD-10-CM | POA: Diagnosis not present

## 2016-10-05 DIAGNOSIS — I872 Venous insufficiency (chronic) (peripheral): Secondary | ICD-10-CM | POA: Diagnosis not present

## 2016-10-05 DIAGNOSIS — E11621 Type 2 diabetes mellitus with foot ulcer: Secondary | ICD-10-CM | POA: Diagnosis not present

## 2016-10-05 DIAGNOSIS — L97514 Non-pressure chronic ulcer of other part of right foot with necrosis of bone: Secondary | ICD-10-CM | POA: Diagnosis not present

## 2016-10-05 DIAGNOSIS — L97515 Non-pressure chronic ulcer of other part of right foot with muscle involvement without evidence of necrosis: Secondary | ICD-10-CM | POA: Diagnosis not present

## 2016-10-06 DIAGNOSIS — L97515 Non-pressure chronic ulcer of other part of right foot with muscle involvement without evidence of necrosis: Secondary | ICD-10-CM | POA: Diagnosis not present

## 2016-10-06 DIAGNOSIS — M6281 Muscle weakness (generalized): Secondary | ICD-10-CM | POA: Diagnosis not present

## 2016-10-06 DIAGNOSIS — E11621 Type 2 diabetes mellitus with foot ulcer: Secondary | ICD-10-CM | POA: Diagnosis not present

## 2016-10-06 DIAGNOSIS — I69398 Other sequelae of cerebral infarction: Secondary | ICD-10-CM | POA: Diagnosis not present

## 2016-10-06 DIAGNOSIS — I129 Hypertensive chronic kidney disease with stage 1 through stage 4 chronic kidney disease, or unspecified chronic kidney disease: Secondary | ICD-10-CM | POA: Diagnosis not present

## 2016-10-06 DIAGNOSIS — I872 Venous insufficiency (chronic) (peripheral): Secondary | ICD-10-CM | POA: Diagnosis not present

## 2016-10-06 DIAGNOSIS — E1165 Type 2 diabetes mellitus with hyperglycemia: Secondary | ICD-10-CM | POA: Diagnosis not present

## 2016-10-06 DIAGNOSIS — L97511 Non-pressure chronic ulcer of other part of right foot limited to breakdown of skin: Secondary | ICD-10-CM | POA: Diagnosis not present

## 2016-10-06 DIAGNOSIS — L97514 Non-pressure chronic ulcer of other part of right foot with necrosis of bone: Secondary | ICD-10-CM | POA: Diagnosis not present

## 2016-10-07 DIAGNOSIS — I83013 Varicose veins of right lower extremity with ulcer of ankle: Secondary | ICD-10-CM | POA: Diagnosis not present

## 2016-10-07 DIAGNOSIS — E11621 Type 2 diabetes mellitus with foot ulcer: Secondary | ICD-10-CM | POA: Diagnosis not present

## 2016-10-07 DIAGNOSIS — L97515 Non-pressure chronic ulcer of other part of right foot with muscle involvement without evidence of necrosis: Secondary | ICD-10-CM | POA: Diagnosis not present

## 2016-10-07 DIAGNOSIS — L97514 Non-pressure chronic ulcer of other part of right foot with necrosis of bone: Secondary | ICD-10-CM | POA: Diagnosis not present

## 2016-10-07 DIAGNOSIS — L97312 Non-pressure chronic ulcer of right ankle with fat layer exposed: Secondary | ICD-10-CM | POA: Diagnosis not present

## 2016-10-07 DIAGNOSIS — I872 Venous insufficiency (chronic) (peripheral): Secondary | ICD-10-CM | POA: Diagnosis not present

## 2016-10-08 DIAGNOSIS — L97511 Non-pressure chronic ulcer of other part of right foot limited to breakdown of skin: Secondary | ICD-10-CM | POA: Diagnosis not present

## 2016-10-08 DIAGNOSIS — E1165 Type 2 diabetes mellitus with hyperglycemia: Secondary | ICD-10-CM | POA: Diagnosis not present

## 2016-10-08 DIAGNOSIS — E11621 Type 2 diabetes mellitus with foot ulcer: Secondary | ICD-10-CM | POA: Diagnosis not present

## 2016-10-08 DIAGNOSIS — M6281 Muscle weakness (generalized): Secondary | ICD-10-CM | POA: Diagnosis not present

## 2016-10-08 DIAGNOSIS — I129 Hypertensive chronic kidney disease with stage 1 through stage 4 chronic kidney disease, or unspecified chronic kidney disease: Secondary | ICD-10-CM | POA: Diagnosis not present

## 2016-10-08 DIAGNOSIS — I69398 Other sequelae of cerebral infarction: Secondary | ICD-10-CM | POA: Diagnosis not present

## 2016-10-11 DIAGNOSIS — E1165 Type 2 diabetes mellitus with hyperglycemia: Secondary | ICD-10-CM | POA: Diagnosis not present

## 2016-10-11 DIAGNOSIS — I69398 Other sequelae of cerebral infarction: Secondary | ICD-10-CM | POA: Diagnosis not present

## 2016-10-11 DIAGNOSIS — L97511 Non-pressure chronic ulcer of other part of right foot limited to breakdown of skin: Secondary | ICD-10-CM | POA: Diagnosis not present

## 2016-10-11 DIAGNOSIS — E11621 Type 2 diabetes mellitus with foot ulcer: Secondary | ICD-10-CM | POA: Diagnosis not present

## 2016-10-11 DIAGNOSIS — I129 Hypertensive chronic kidney disease with stage 1 through stage 4 chronic kidney disease, or unspecified chronic kidney disease: Secondary | ICD-10-CM | POA: Diagnosis not present

## 2016-10-11 DIAGNOSIS — M6281 Muscle weakness (generalized): Secondary | ICD-10-CM | POA: Diagnosis not present

## 2016-10-12 DIAGNOSIS — L97518 Non-pressure chronic ulcer of other part of right foot with other specified severity: Secondary | ICD-10-CM | POA: Diagnosis not present

## 2016-10-12 DIAGNOSIS — E11649 Type 2 diabetes mellitus with hypoglycemia without coma: Secondary | ICD-10-CM | POA: Diagnosis not present

## 2016-10-12 DIAGNOSIS — I872 Venous insufficiency (chronic) (peripheral): Secondary | ICD-10-CM | POA: Diagnosis not present

## 2016-10-12 DIAGNOSIS — E11621 Type 2 diabetes mellitus with foot ulcer: Secondary | ICD-10-CM | POA: Diagnosis not present

## 2016-10-12 DIAGNOSIS — L84 Corns and callosities: Secondary | ICD-10-CM | POA: Diagnosis not present

## 2016-10-12 DIAGNOSIS — L97514 Non-pressure chronic ulcer of other part of right foot with necrosis of bone: Secondary | ICD-10-CM | POA: Diagnosis not present

## 2016-10-13 DIAGNOSIS — E11621 Type 2 diabetes mellitus with foot ulcer: Secondary | ICD-10-CM | POA: Diagnosis not present

## 2016-10-13 DIAGNOSIS — E11649 Type 2 diabetes mellitus with hypoglycemia without coma: Secondary | ICD-10-CM | POA: Diagnosis not present

## 2016-10-13 DIAGNOSIS — M6281 Muscle weakness (generalized): Secondary | ICD-10-CM | POA: Diagnosis not present

## 2016-10-13 DIAGNOSIS — I69398 Other sequelae of cerebral infarction: Secondary | ICD-10-CM | POA: Diagnosis not present

## 2016-10-13 DIAGNOSIS — E1165 Type 2 diabetes mellitus with hyperglycemia: Secondary | ICD-10-CM | POA: Diagnosis not present

## 2016-10-13 DIAGNOSIS — I129 Hypertensive chronic kidney disease with stage 1 through stage 4 chronic kidney disease, or unspecified chronic kidney disease: Secondary | ICD-10-CM | POA: Diagnosis not present

## 2016-10-13 DIAGNOSIS — L97511 Non-pressure chronic ulcer of other part of right foot limited to breakdown of skin: Secondary | ICD-10-CM | POA: Diagnosis not present

## 2016-10-14 DIAGNOSIS — E11649 Type 2 diabetes mellitus with hypoglycemia without coma: Secondary | ICD-10-CM | POA: Diagnosis not present

## 2016-10-14 DIAGNOSIS — I872 Venous insufficiency (chronic) (peripheral): Secondary | ICD-10-CM | POA: Diagnosis not present

## 2016-10-14 DIAGNOSIS — E11621 Type 2 diabetes mellitus with foot ulcer: Secondary | ICD-10-CM | POA: Diagnosis not present

## 2016-10-14 DIAGNOSIS — L97514 Non-pressure chronic ulcer of other part of right foot with necrosis of bone: Secondary | ICD-10-CM | POA: Diagnosis not present

## 2016-10-14 DIAGNOSIS — I83013 Varicose veins of right lower extremity with ulcer of ankle: Secondary | ICD-10-CM | POA: Diagnosis not present

## 2016-10-14 DIAGNOSIS — L97312 Non-pressure chronic ulcer of right ankle with fat layer exposed: Secondary | ICD-10-CM | POA: Diagnosis not present

## 2016-10-14 DIAGNOSIS — L84 Corns and callosities: Secondary | ICD-10-CM | POA: Diagnosis not present

## 2016-10-14 DIAGNOSIS — L97518 Non-pressure chronic ulcer of other part of right foot with other specified severity: Secondary | ICD-10-CM | POA: Diagnosis not present

## 2016-10-15 DIAGNOSIS — M6281 Muscle weakness (generalized): Secondary | ICD-10-CM | POA: Diagnosis not present

## 2016-10-15 DIAGNOSIS — I639 Cerebral infarction, unspecified: Secondary | ICD-10-CM | POA: Diagnosis not present

## 2016-10-15 DIAGNOSIS — I10 Essential (primary) hypertension: Secondary | ICD-10-CM | POA: Diagnosis not present

## 2016-10-15 DIAGNOSIS — I69398 Other sequelae of cerebral infarction: Secondary | ICD-10-CM | POA: Diagnosis not present

## 2016-10-15 DIAGNOSIS — E11621 Type 2 diabetes mellitus with foot ulcer: Secondary | ICD-10-CM | POA: Diagnosis not present

## 2016-10-15 DIAGNOSIS — E1165 Type 2 diabetes mellitus with hyperglycemia: Secondary | ICD-10-CM | POA: Diagnosis not present

## 2016-10-15 DIAGNOSIS — L97511 Non-pressure chronic ulcer of other part of right foot limited to breakdown of skin: Secondary | ICD-10-CM | POA: Diagnosis not present

## 2016-10-15 DIAGNOSIS — I129 Hypertensive chronic kidney disease with stage 1 through stage 4 chronic kidney disease, or unspecified chronic kidney disease: Secondary | ICD-10-CM | POA: Diagnosis not present

## 2016-10-15 DIAGNOSIS — E119 Type 2 diabetes mellitus without complications: Secondary | ICD-10-CM | POA: Diagnosis not present

## 2016-10-18 DIAGNOSIS — I69398 Other sequelae of cerebral infarction: Secondary | ICD-10-CM | POA: Diagnosis not present

## 2016-10-18 DIAGNOSIS — Z299 Encounter for prophylactic measures, unspecified: Secondary | ICD-10-CM | POA: Diagnosis not present

## 2016-10-18 DIAGNOSIS — F419 Anxiety disorder, unspecified: Secondary | ICD-10-CM | POA: Diagnosis not present

## 2016-10-18 DIAGNOSIS — E11621 Type 2 diabetes mellitus with foot ulcer: Secondary | ICD-10-CM | POA: Diagnosis not present

## 2016-10-18 DIAGNOSIS — I739 Peripheral vascular disease, unspecified: Secondary | ICD-10-CM | POA: Diagnosis not present

## 2016-10-18 DIAGNOSIS — I129 Hypertensive chronic kidney disease with stage 1 through stage 4 chronic kidney disease, or unspecified chronic kidney disease: Secondary | ICD-10-CM | POA: Diagnosis not present

## 2016-10-18 DIAGNOSIS — R0602 Shortness of breath: Secondary | ICD-10-CM | POA: Diagnosis not present

## 2016-10-18 DIAGNOSIS — E7800 Pure hypercholesterolemia, unspecified: Secondary | ICD-10-CM | POA: Diagnosis not present

## 2016-10-18 DIAGNOSIS — I10 Essential (primary) hypertension: Secondary | ICD-10-CM | POA: Diagnosis not present

## 2016-10-18 DIAGNOSIS — M6281 Muscle weakness (generalized): Secondary | ICD-10-CM | POA: Diagnosis not present

## 2016-10-18 DIAGNOSIS — Z6831 Body mass index (BMI) 31.0-31.9, adult: Secondary | ICD-10-CM | POA: Diagnosis not present

## 2016-10-18 DIAGNOSIS — L97511 Non-pressure chronic ulcer of other part of right foot limited to breakdown of skin: Secondary | ICD-10-CM | POA: Diagnosis not present

## 2016-10-18 DIAGNOSIS — Z713 Dietary counseling and surveillance: Secondary | ICD-10-CM | POA: Diagnosis not present

## 2016-10-18 DIAGNOSIS — E1165 Type 2 diabetes mellitus with hyperglycemia: Secondary | ICD-10-CM | POA: Diagnosis not present

## 2016-10-20 DIAGNOSIS — K2900 Acute gastritis without bleeding: Secondary | ICD-10-CM | POA: Diagnosis not present

## 2016-10-20 DIAGNOSIS — M6281 Muscle weakness (generalized): Secondary | ICD-10-CM | POA: Diagnosis not present

## 2016-10-20 DIAGNOSIS — E11621 Type 2 diabetes mellitus with foot ulcer: Secondary | ICD-10-CM | POA: Diagnosis not present

## 2016-10-20 DIAGNOSIS — E1165 Type 2 diabetes mellitus with hyperglycemia: Secondary | ICD-10-CM | POA: Diagnosis not present

## 2016-10-20 DIAGNOSIS — L97511 Non-pressure chronic ulcer of other part of right foot limited to breakdown of skin: Secondary | ICD-10-CM | POA: Diagnosis not present

## 2016-10-20 DIAGNOSIS — I69398 Other sequelae of cerebral infarction: Secondary | ICD-10-CM | POA: Diagnosis not present

## 2016-10-20 DIAGNOSIS — I129 Hypertensive chronic kidney disease with stage 1 through stage 4 chronic kidney disease, or unspecified chronic kidney disease: Secondary | ICD-10-CM | POA: Diagnosis not present

## 2016-10-21 DIAGNOSIS — I872 Venous insufficiency (chronic) (peripheral): Secondary | ICD-10-CM | POA: Diagnosis not present

## 2016-10-21 DIAGNOSIS — L97514 Non-pressure chronic ulcer of other part of right foot with necrosis of bone: Secondary | ICD-10-CM | POA: Diagnosis not present

## 2016-10-21 DIAGNOSIS — L97518 Non-pressure chronic ulcer of other part of right foot with other specified severity: Secondary | ICD-10-CM | POA: Diagnosis not present

## 2016-10-21 DIAGNOSIS — L84 Corns and callosities: Secondary | ICD-10-CM | POA: Diagnosis not present

## 2016-10-21 DIAGNOSIS — E11621 Type 2 diabetes mellitus with foot ulcer: Secondary | ICD-10-CM | POA: Diagnosis not present

## 2016-10-21 DIAGNOSIS — E11649 Type 2 diabetes mellitus with hypoglycemia without coma: Secondary | ICD-10-CM | POA: Diagnosis not present

## 2016-10-22 DIAGNOSIS — I69398 Other sequelae of cerebral infarction: Secondary | ICD-10-CM | POA: Diagnosis not present

## 2016-10-22 DIAGNOSIS — E1165 Type 2 diabetes mellitus with hyperglycemia: Secondary | ICD-10-CM | POA: Diagnosis not present

## 2016-10-22 DIAGNOSIS — L97511 Non-pressure chronic ulcer of other part of right foot limited to breakdown of skin: Secondary | ICD-10-CM | POA: Diagnosis not present

## 2016-10-22 DIAGNOSIS — E11621 Type 2 diabetes mellitus with foot ulcer: Secondary | ICD-10-CM | POA: Diagnosis not present

## 2016-10-22 DIAGNOSIS — M6281 Muscle weakness (generalized): Secondary | ICD-10-CM | POA: Diagnosis not present

## 2016-10-22 DIAGNOSIS — I129 Hypertensive chronic kidney disease with stage 1 through stage 4 chronic kidney disease, or unspecified chronic kidney disease: Secondary | ICD-10-CM | POA: Diagnosis not present

## 2016-10-25 DIAGNOSIS — E11621 Type 2 diabetes mellitus with foot ulcer: Secondary | ICD-10-CM | POA: Diagnosis not present

## 2016-10-25 DIAGNOSIS — I129 Hypertensive chronic kidney disease with stage 1 through stage 4 chronic kidney disease, or unspecified chronic kidney disease: Secondary | ICD-10-CM | POA: Diagnosis not present

## 2016-10-25 DIAGNOSIS — I69398 Other sequelae of cerebral infarction: Secondary | ICD-10-CM | POA: Diagnosis not present

## 2016-10-25 DIAGNOSIS — E1165 Type 2 diabetes mellitus with hyperglycemia: Secondary | ICD-10-CM | POA: Diagnosis not present

## 2016-10-25 DIAGNOSIS — L97511 Non-pressure chronic ulcer of other part of right foot limited to breakdown of skin: Secondary | ICD-10-CM | POA: Diagnosis not present

## 2016-10-25 DIAGNOSIS — M6281 Muscle weakness (generalized): Secondary | ICD-10-CM | POA: Diagnosis not present

## 2016-10-26 DIAGNOSIS — E11649 Type 2 diabetes mellitus with hypoglycemia without coma: Secondary | ICD-10-CM | POA: Diagnosis not present

## 2016-10-26 DIAGNOSIS — L84 Corns and callosities: Secondary | ICD-10-CM | POA: Diagnosis not present

## 2016-10-26 DIAGNOSIS — L97514 Non-pressure chronic ulcer of other part of right foot with necrosis of bone: Secondary | ICD-10-CM | POA: Diagnosis not present

## 2016-10-26 DIAGNOSIS — L97518 Non-pressure chronic ulcer of other part of right foot with other specified severity: Secondary | ICD-10-CM | POA: Diagnosis not present

## 2016-10-26 DIAGNOSIS — E11621 Type 2 diabetes mellitus with foot ulcer: Secondary | ICD-10-CM | POA: Diagnosis not present

## 2016-10-26 DIAGNOSIS — I872 Venous insufficiency (chronic) (peripheral): Secondary | ICD-10-CM | POA: Diagnosis not present

## 2016-10-27 DIAGNOSIS — I69398 Other sequelae of cerebral infarction: Secondary | ICD-10-CM | POA: Diagnosis not present

## 2016-10-27 DIAGNOSIS — L97511 Non-pressure chronic ulcer of other part of right foot limited to breakdown of skin: Secondary | ICD-10-CM | POA: Diagnosis not present

## 2016-10-27 DIAGNOSIS — L97514 Non-pressure chronic ulcer of other part of right foot with necrosis of bone: Secondary | ICD-10-CM | POA: Diagnosis not present

## 2016-10-27 DIAGNOSIS — M6281 Muscle weakness (generalized): Secondary | ICD-10-CM | POA: Diagnosis not present

## 2016-10-27 DIAGNOSIS — E1165 Type 2 diabetes mellitus with hyperglycemia: Secondary | ICD-10-CM | POA: Diagnosis not present

## 2016-10-27 DIAGNOSIS — L84 Corns and callosities: Secondary | ICD-10-CM | POA: Diagnosis not present

## 2016-10-27 DIAGNOSIS — I129 Hypertensive chronic kidney disease with stage 1 through stage 4 chronic kidney disease, or unspecified chronic kidney disease: Secondary | ICD-10-CM | POA: Diagnosis not present

## 2016-10-27 DIAGNOSIS — I872 Venous insufficiency (chronic) (peripheral): Secondary | ICD-10-CM | POA: Diagnosis not present

## 2016-10-27 DIAGNOSIS — E11649 Type 2 diabetes mellitus with hypoglycemia without coma: Secondary | ICD-10-CM | POA: Diagnosis not present

## 2016-10-27 DIAGNOSIS — E11621 Type 2 diabetes mellitus with foot ulcer: Secondary | ICD-10-CM | POA: Diagnosis not present

## 2016-10-27 DIAGNOSIS — L97518 Non-pressure chronic ulcer of other part of right foot with other specified severity: Secondary | ICD-10-CM | POA: Diagnosis not present

## 2016-10-28 DIAGNOSIS — I10 Essential (primary) hypertension: Secondary | ICD-10-CM | POA: Diagnosis not present

## 2016-10-28 DIAGNOSIS — I638 Other cerebral infarction: Secondary | ICD-10-CM | POA: Diagnosis not present

## 2016-10-28 DIAGNOSIS — Z299 Encounter for prophylactic measures, unspecified: Secondary | ICD-10-CM | POA: Diagnosis not present

## 2016-10-28 DIAGNOSIS — R202 Paresthesia of skin: Secondary | ICD-10-CM | POA: Diagnosis not present

## 2016-10-28 DIAGNOSIS — E7800 Pure hypercholesterolemia, unspecified: Secondary | ICD-10-CM | POA: Diagnosis not present

## 2016-10-28 DIAGNOSIS — I739 Peripheral vascular disease, unspecified: Secondary | ICD-10-CM | POA: Diagnosis not present

## 2016-10-28 DIAGNOSIS — E1165 Type 2 diabetes mellitus with hyperglycemia: Secondary | ICD-10-CM | POA: Diagnosis not present

## 2016-10-28 DIAGNOSIS — Z6830 Body mass index (BMI) 30.0-30.9, adult: Secondary | ICD-10-CM | POA: Diagnosis not present

## 2016-10-28 DIAGNOSIS — Z713 Dietary counseling and surveillance: Secondary | ICD-10-CM | POA: Diagnosis not present

## 2016-10-29 DIAGNOSIS — L97511 Non-pressure chronic ulcer of other part of right foot limited to breakdown of skin: Secondary | ICD-10-CM | POA: Diagnosis not present

## 2016-10-29 DIAGNOSIS — E11621 Type 2 diabetes mellitus with foot ulcer: Secondary | ICD-10-CM | POA: Diagnosis not present

## 2016-10-29 DIAGNOSIS — I129 Hypertensive chronic kidney disease with stage 1 through stage 4 chronic kidney disease, or unspecified chronic kidney disease: Secondary | ICD-10-CM | POA: Diagnosis not present

## 2016-10-29 DIAGNOSIS — I69398 Other sequelae of cerebral infarction: Secondary | ICD-10-CM | POA: Diagnosis not present

## 2016-10-29 DIAGNOSIS — M6281 Muscle weakness (generalized): Secondary | ICD-10-CM | POA: Diagnosis not present

## 2016-10-29 DIAGNOSIS — E1165 Type 2 diabetes mellitus with hyperglycemia: Secondary | ICD-10-CM | POA: Diagnosis not present

## 2016-11-01 DIAGNOSIS — M6281 Muscle weakness (generalized): Secondary | ICD-10-CM | POA: Diagnosis not present

## 2016-11-01 DIAGNOSIS — E1165 Type 2 diabetes mellitus with hyperglycemia: Secondary | ICD-10-CM | POA: Diagnosis not present

## 2016-11-01 DIAGNOSIS — E11621 Type 2 diabetes mellitus with foot ulcer: Secondary | ICD-10-CM | POA: Diagnosis not present

## 2016-11-01 DIAGNOSIS — L97511 Non-pressure chronic ulcer of other part of right foot limited to breakdown of skin: Secondary | ICD-10-CM | POA: Diagnosis not present

## 2016-11-01 DIAGNOSIS — I69398 Other sequelae of cerebral infarction: Secondary | ICD-10-CM | POA: Diagnosis not present

## 2016-11-01 DIAGNOSIS — I129 Hypertensive chronic kidney disease with stage 1 through stage 4 chronic kidney disease, or unspecified chronic kidney disease: Secondary | ICD-10-CM | POA: Diagnosis not present

## 2016-11-03 DIAGNOSIS — I69398 Other sequelae of cerebral infarction: Secondary | ICD-10-CM | POA: Diagnosis not present

## 2016-11-03 DIAGNOSIS — E11621 Type 2 diabetes mellitus with foot ulcer: Secondary | ICD-10-CM | POA: Diagnosis not present

## 2016-11-03 DIAGNOSIS — I129 Hypertensive chronic kidney disease with stage 1 through stage 4 chronic kidney disease, or unspecified chronic kidney disease: Secondary | ICD-10-CM | POA: Diagnosis not present

## 2016-11-03 DIAGNOSIS — E1165 Type 2 diabetes mellitus with hyperglycemia: Secondary | ICD-10-CM | POA: Diagnosis not present

## 2016-11-03 DIAGNOSIS — L97511 Non-pressure chronic ulcer of other part of right foot limited to breakdown of skin: Secondary | ICD-10-CM | POA: Diagnosis not present

## 2016-11-03 DIAGNOSIS — M6281 Muscle weakness (generalized): Secondary | ICD-10-CM | POA: Diagnosis not present

## 2016-11-04 DIAGNOSIS — L97811 Non-pressure chronic ulcer of other part of right lower leg limited to breakdown of skin: Secondary | ICD-10-CM | POA: Diagnosis not present

## 2016-11-04 DIAGNOSIS — E11649 Type 2 diabetes mellitus with hypoglycemia without coma: Secondary | ICD-10-CM | POA: Diagnosis not present

## 2016-11-04 DIAGNOSIS — I872 Venous insufficiency (chronic) (peripheral): Secondary | ICD-10-CM | POA: Diagnosis not present

## 2016-11-04 DIAGNOSIS — I83025 Varicose veins of left lower extremity with ulcer other part of foot: Secondary | ICD-10-CM | POA: Diagnosis not present

## 2016-11-04 DIAGNOSIS — E11621 Type 2 diabetes mellitus with foot ulcer: Secondary | ICD-10-CM | POA: Diagnosis not present

## 2016-11-04 DIAGNOSIS — L97518 Non-pressure chronic ulcer of other part of right foot with other specified severity: Secondary | ICD-10-CM | POA: Diagnosis not present

## 2016-11-04 DIAGNOSIS — I83018 Varicose veins of right lower extremity with ulcer other part of lower leg: Secondary | ICD-10-CM | POA: Diagnosis not present

## 2016-11-04 DIAGNOSIS — L84 Corns and callosities: Secondary | ICD-10-CM | POA: Diagnosis not present

## 2016-11-05 ENCOUNTER — Encounter: Payer: Self-pay | Admitting: Neurology

## 2016-11-05 ENCOUNTER — Other Ambulatory Visit: Payer: Self-pay | Admitting: *Deleted

## 2016-11-05 DIAGNOSIS — M6281 Muscle weakness (generalized): Secondary | ICD-10-CM | POA: Diagnosis not present

## 2016-11-05 DIAGNOSIS — I69398 Other sequelae of cerebral infarction: Secondary | ICD-10-CM | POA: Diagnosis not present

## 2016-11-05 DIAGNOSIS — E11621 Type 2 diabetes mellitus with foot ulcer: Secondary | ICD-10-CM | POA: Diagnosis not present

## 2016-11-05 DIAGNOSIS — E1165 Type 2 diabetes mellitus with hyperglycemia: Secondary | ICD-10-CM | POA: Diagnosis not present

## 2016-11-05 DIAGNOSIS — R202 Paresthesia of skin: Secondary | ICD-10-CM

## 2016-11-05 DIAGNOSIS — I129 Hypertensive chronic kidney disease with stage 1 through stage 4 chronic kidney disease, or unspecified chronic kidney disease: Secondary | ICD-10-CM | POA: Diagnosis not present

## 2016-11-05 DIAGNOSIS — L97511 Non-pressure chronic ulcer of other part of right foot limited to breakdown of skin: Secondary | ICD-10-CM | POA: Diagnosis not present

## 2016-11-08 DIAGNOSIS — I69398 Other sequelae of cerebral infarction: Secondary | ICD-10-CM | POA: Diagnosis not present

## 2016-11-08 DIAGNOSIS — I129 Hypertensive chronic kidney disease with stage 1 through stage 4 chronic kidney disease, or unspecified chronic kidney disease: Secondary | ICD-10-CM | POA: Diagnosis not present

## 2016-11-08 DIAGNOSIS — Z299 Encounter for prophylactic measures, unspecified: Secondary | ICD-10-CM | POA: Diagnosis not present

## 2016-11-08 DIAGNOSIS — M6281 Muscle weakness (generalized): Secondary | ICD-10-CM | POA: Diagnosis not present

## 2016-11-08 DIAGNOSIS — E11621 Type 2 diabetes mellitus with foot ulcer: Secondary | ICD-10-CM | POA: Diagnosis not present

## 2016-11-08 DIAGNOSIS — Z1331 Encounter for screening for depression: Secondary | ICD-10-CM | POA: Diagnosis not present

## 2016-11-08 DIAGNOSIS — L97511 Non-pressure chronic ulcer of other part of right foot limited to breakdown of skin: Secondary | ICD-10-CM | POA: Diagnosis not present

## 2016-11-08 DIAGNOSIS — Z0000 Encounter for general adult medical examination without abnormal findings: Secondary | ICD-10-CM | POA: Diagnosis not present

## 2016-11-08 DIAGNOSIS — Z6830 Body mass index (BMI) 30.0-30.9, adult: Secondary | ICD-10-CM | POA: Diagnosis not present

## 2016-11-08 DIAGNOSIS — Z1211 Encounter for screening for malignant neoplasm of colon: Secondary | ICD-10-CM | POA: Diagnosis not present

## 2016-11-08 DIAGNOSIS — Z23 Encounter for immunization: Secondary | ICD-10-CM | POA: Diagnosis not present

## 2016-11-08 DIAGNOSIS — Z7189 Other specified counseling: Secondary | ICD-10-CM | POA: Diagnosis not present

## 2016-11-08 DIAGNOSIS — Z1339 Encounter for screening examination for other mental health and behavioral disorders: Secondary | ICD-10-CM | POA: Diagnosis not present

## 2016-11-08 DIAGNOSIS — E1165 Type 2 diabetes mellitus with hyperglycemia: Secondary | ICD-10-CM | POA: Diagnosis not present

## 2016-11-10 DIAGNOSIS — I69398 Other sequelae of cerebral infarction: Secondary | ICD-10-CM | POA: Diagnosis not present

## 2016-11-10 DIAGNOSIS — M6281 Muscle weakness (generalized): Secondary | ICD-10-CM | POA: Diagnosis not present

## 2016-11-10 DIAGNOSIS — I129 Hypertensive chronic kidney disease with stage 1 through stage 4 chronic kidney disease, or unspecified chronic kidney disease: Secondary | ICD-10-CM | POA: Diagnosis not present

## 2016-11-10 DIAGNOSIS — E1165 Type 2 diabetes mellitus with hyperglycemia: Secondary | ICD-10-CM | POA: Diagnosis not present

## 2016-11-10 DIAGNOSIS — E11621 Type 2 diabetes mellitus with foot ulcer: Secondary | ICD-10-CM | POA: Diagnosis not present

## 2016-11-10 DIAGNOSIS — L97511 Non-pressure chronic ulcer of other part of right foot limited to breakdown of skin: Secondary | ICD-10-CM | POA: Diagnosis not present

## 2016-11-11 DIAGNOSIS — I639 Cerebral infarction, unspecified: Secondary | ICD-10-CM | POA: Diagnosis not present

## 2016-11-11 DIAGNOSIS — E119 Type 2 diabetes mellitus without complications: Secondary | ICD-10-CM | POA: Diagnosis not present

## 2016-11-11 DIAGNOSIS — I10 Essential (primary) hypertension: Secondary | ICD-10-CM | POA: Diagnosis not present

## 2016-11-12 DIAGNOSIS — M6281 Muscle weakness (generalized): Secondary | ICD-10-CM | POA: Diagnosis not present

## 2016-11-12 DIAGNOSIS — I69398 Other sequelae of cerebral infarction: Secondary | ICD-10-CM | POA: Diagnosis not present

## 2016-11-12 DIAGNOSIS — I129 Hypertensive chronic kidney disease with stage 1 through stage 4 chronic kidney disease, or unspecified chronic kidney disease: Secondary | ICD-10-CM | POA: Diagnosis not present

## 2016-11-12 DIAGNOSIS — E11621 Type 2 diabetes mellitus with foot ulcer: Secondary | ICD-10-CM | POA: Diagnosis not present

## 2016-11-12 DIAGNOSIS — E1165 Type 2 diabetes mellitus with hyperglycemia: Secondary | ICD-10-CM | POA: Diagnosis not present

## 2016-11-12 DIAGNOSIS — L97511 Non-pressure chronic ulcer of other part of right foot limited to breakdown of skin: Secondary | ICD-10-CM | POA: Diagnosis not present

## 2016-11-15 DIAGNOSIS — E1165 Type 2 diabetes mellitus with hyperglycemia: Secondary | ICD-10-CM | POA: Diagnosis not present

## 2016-11-15 DIAGNOSIS — L97511 Non-pressure chronic ulcer of other part of right foot limited to breakdown of skin: Secondary | ICD-10-CM | POA: Diagnosis not present

## 2016-11-15 DIAGNOSIS — I129 Hypertensive chronic kidney disease with stage 1 through stage 4 chronic kidney disease, or unspecified chronic kidney disease: Secondary | ICD-10-CM | POA: Diagnosis not present

## 2016-11-15 DIAGNOSIS — E11621 Type 2 diabetes mellitus with foot ulcer: Secondary | ICD-10-CM | POA: Diagnosis not present

## 2016-11-15 DIAGNOSIS — I69398 Other sequelae of cerebral infarction: Secondary | ICD-10-CM | POA: Diagnosis not present

## 2016-11-15 DIAGNOSIS — M6281 Muscle weakness (generalized): Secondary | ICD-10-CM | POA: Diagnosis not present

## 2016-11-17 DIAGNOSIS — M6281 Muscle weakness (generalized): Secondary | ICD-10-CM | POA: Diagnosis not present

## 2016-11-17 DIAGNOSIS — I129 Hypertensive chronic kidney disease with stage 1 through stage 4 chronic kidney disease, or unspecified chronic kidney disease: Secondary | ICD-10-CM | POA: Diagnosis not present

## 2016-11-17 DIAGNOSIS — E1165 Type 2 diabetes mellitus with hyperglycemia: Secondary | ICD-10-CM | POA: Diagnosis not present

## 2016-11-17 DIAGNOSIS — E11621 Type 2 diabetes mellitus with foot ulcer: Secondary | ICD-10-CM | POA: Diagnosis not present

## 2016-11-17 DIAGNOSIS — I69398 Other sequelae of cerebral infarction: Secondary | ICD-10-CM | POA: Diagnosis not present

## 2016-11-17 DIAGNOSIS — L97511 Non-pressure chronic ulcer of other part of right foot limited to breakdown of skin: Secondary | ICD-10-CM | POA: Diagnosis not present

## 2016-11-19 DIAGNOSIS — E1165 Type 2 diabetes mellitus with hyperglycemia: Secondary | ICD-10-CM | POA: Diagnosis not present

## 2016-11-19 DIAGNOSIS — M6281 Muscle weakness (generalized): Secondary | ICD-10-CM | POA: Diagnosis not present

## 2016-11-19 DIAGNOSIS — E11621 Type 2 diabetes mellitus with foot ulcer: Secondary | ICD-10-CM | POA: Diagnosis not present

## 2016-11-19 DIAGNOSIS — I69398 Other sequelae of cerebral infarction: Secondary | ICD-10-CM | POA: Diagnosis not present

## 2016-11-19 DIAGNOSIS — I129 Hypertensive chronic kidney disease with stage 1 through stage 4 chronic kidney disease, or unspecified chronic kidney disease: Secondary | ICD-10-CM | POA: Diagnosis not present

## 2016-11-19 DIAGNOSIS — L97511 Non-pressure chronic ulcer of other part of right foot limited to breakdown of skin: Secondary | ICD-10-CM | POA: Diagnosis not present

## 2016-11-22 DIAGNOSIS — E11621 Type 2 diabetes mellitus with foot ulcer: Secondary | ICD-10-CM | POA: Diagnosis not present

## 2016-11-22 DIAGNOSIS — E1165 Type 2 diabetes mellitus with hyperglycemia: Secondary | ICD-10-CM | POA: Diagnosis not present

## 2016-11-22 DIAGNOSIS — I129 Hypertensive chronic kidney disease with stage 1 through stage 4 chronic kidney disease, or unspecified chronic kidney disease: Secondary | ICD-10-CM | POA: Diagnosis not present

## 2016-11-22 DIAGNOSIS — I69398 Other sequelae of cerebral infarction: Secondary | ICD-10-CM | POA: Diagnosis not present

## 2016-11-22 DIAGNOSIS — M6281 Muscle weakness (generalized): Secondary | ICD-10-CM | POA: Diagnosis not present

## 2016-11-22 DIAGNOSIS — L97511 Non-pressure chronic ulcer of other part of right foot limited to breakdown of skin: Secondary | ICD-10-CM | POA: Diagnosis not present

## 2016-11-23 ENCOUNTER — Ambulatory Visit (INDEPENDENT_AMBULATORY_CARE_PROVIDER_SITE_OTHER): Payer: Medicare Other | Admitting: Neurology

## 2016-11-23 DIAGNOSIS — R202 Paresthesia of skin: Secondary | ICD-10-CM | POA: Diagnosis not present

## 2016-11-23 DIAGNOSIS — G5602 Carpal tunnel syndrome, left upper limb: Secondary | ICD-10-CM

## 2016-11-23 DIAGNOSIS — G5622 Lesion of ulnar nerve, left upper limb: Secondary | ICD-10-CM

## 2016-11-23 NOTE — Procedures (Signed)
[facility]  [address] [place], [address]  [place], [place] [address] Tel: [phone] Fax:  [phone] Test Date:  11/23/2016  Patient: Matthew Mccormick DOB: May 18, 1955 Physician: Nita Sickle, DO  Sex: Male Height: 5\' 9"  Ref Phys: Kirstie Peri, M.D.  ID#: [id number] Temp: 33.0C Technician:    Patient Complaints: This is a 61 year-old man with history of stroke and residual left sided weakness referred for evaluation of left arm paresthesias.  NCV & EMG Findings: Extensive electrodiagnostic testing of the left upper extremity shows:  1. Left ulnar sensory response is absent. Left median sensory response shows prolonged latency (4.3 ms) and normal amplitude. Left radial sensory responses within normal limits. 2. Left ulnar motor response shows severely reduced amplitude (1.1 mV), prolonged latency (3.4 ms), and decreased decreased conduction velocity (B Elbow-Wrist, 35 m/s), and decreased conduction velocity (A Elbow-B Elbow, 29 m/s). Left median motor response shows prolonged latency (4.4 ms) and normal amplitude. 3. Severe chronic motor axon loss changes are seen affecting the ulnar innervated muscles on the left and abductor pollicis brevis. There is no evidence of accompanied active denervation. There is a generalized pattern of incomplete motor unit activation due to central disorder of motor unit control (i.e. stroke).  Impression: 1. 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 2. Right ulnar neuropathy Right median neuropathy at or distal to the wrist, consistent with the clinical diagnosis of carpal tunnel syndrome. Overall, these findings are moderate in degree electrically.   ___________________________ Nita Sickle, DO    Nerve Conduction Studies Anti Sensory Summary Table   Stim Site NR Peak (ms) Norm Peak (ms) P-T Amp (V) Norm P-T Amp  Left Median Anti Sensory  (2nd Digit)  33C  Wrist    4.3 <3.8 11.0 >10  Left Radial Anti Sensory (Base 1st Digit)  33C  Wrist    2.3 <2.8 11.1 >10  Left Ulnar Anti Sensory (5th Digit)  33C  Wrist NR  <3.2  >5   Motor Summary Table   Stim Site NR Onset (ms) Norm Onset (ms) O-P Amp (mV) Norm O-P Amp Site1 Site2 Delta-0 (ms) Dist (cm) Vel (m/s) Norm Vel (m/s)  Left Median Motor (Abd Poll Brev)  33C  Wrist    4.4 <4.0 9.6 >5 Elbow Wrist 5.9 30.0 51 >50  Elbow    10.3  8.4         Left Ulnar Motor (Abd Dig Minimi)  33C  Wrist    3.4 <3.1 1.1 >7 B Elbow Wrist 6.8 24.0 35 >50  B Elbow    10.2  0.8  A Elbow B Elbow 3.4 10.0 29 >50  A Elbow    13.6  0.9          EMG   Side Muscle Ins Act Fibs Psw Fasc Number Recrt Dur Dur. Amp Amp. Poly Poly. Comment  Left 1stDorInt Nml Nml Nml Nml NE None - - - - - - ATR  Left Abd Poll Brev Nml Nml Nml Nml 1- Rapid Some 1+ Some 1+ Nml Nml N/A  Left Ext Indicis Nml Nml Nml Nml 1- Mod-V Nml Nml Nml Nml Nml Nml N/A  Left PronatorTeres Nml Nml Nml Nml 1- Mod-V Nml Nml Nml Nml Nml Nml N/A  Left Biceps Nml Nml Nml Nml 1- Mod-V Nml Nml Nml Nml Nml Nml N/A  Left Triceps Nml Nml Nml Nml 1- Mod-V Nml Nml Nml Nml  Nml Nml N/A  Left Deltoid Nml Nml Nml Nml 1- Mod-V Nml Nml Nml Nml Nml Nml N/A  Left ABD Dig Min Nml Nml Nml Nml [facility] Rapid All 1+ All 1+ All 1+ ATR  Left FlexCarpiUln Nml Nml Nml Nml 2- Rapid Some 1+ Some 1+ Nml Nml N/A      Waveforms:

## 2016-11-24 DIAGNOSIS — I129 Hypertensive chronic kidney disease with stage 1 through stage 4 chronic kidney disease, or unspecified chronic kidney disease: Secondary | ICD-10-CM | POA: Diagnosis not present

## 2016-11-24 DIAGNOSIS — I69398 Other sequelae of cerebral infarction: Secondary | ICD-10-CM | POA: Diagnosis not present

## 2016-11-24 DIAGNOSIS — L97511 Non-pressure chronic ulcer of other part of right foot limited to breakdown of skin: Secondary | ICD-10-CM | POA: Diagnosis not present

## 2016-11-24 DIAGNOSIS — M6281 Muscle weakness (generalized): Secondary | ICD-10-CM | POA: Diagnosis not present

## 2016-11-24 DIAGNOSIS — E1165 Type 2 diabetes mellitus with hyperglycemia: Secondary | ICD-10-CM | POA: Diagnosis not present

## 2016-11-24 DIAGNOSIS — E11621 Type 2 diabetes mellitus with foot ulcer: Secondary | ICD-10-CM | POA: Diagnosis not present

## 2016-11-25 DIAGNOSIS — I83018 Varicose veins of right lower extremity with ulcer other part of lower leg: Secondary | ICD-10-CM | POA: Diagnosis not present

## 2016-11-25 DIAGNOSIS — L97811 Non-pressure chronic ulcer of other part of right lower leg limited to breakdown of skin: Secondary | ICD-10-CM | POA: Diagnosis not present

## 2016-11-25 DIAGNOSIS — E11621 Type 2 diabetes mellitus with foot ulcer: Secondary | ICD-10-CM | POA: Diagnosis not present

## 2016-11-25 DIAGNOSIS — L97529 Non-pressure chronic ulcer of other part of left foot with unspecified severity: Secondary | ICD-10-CM | POA: Diagnosis not present

## 2016-11-25 DIAGNOSIS — I872 Venous insufficiency (chronic) (peripheral): Secondary | ICD-10-CM | POA: Diagnosis not present

## 2016-11-26 DIAGNOSIS — E1165 Type 2 diabetes mellitus with hyperglycemia: Secondary | ICD-10-CM | POA: Diagnosis not present

## 2016-11-26 DIAGNOSIS — I129 Hypertensive chronic kidney disease with stage 1 through stage 4 chronic kidney disease, or unspecified chronic kidney disease: Secondary | ICD-10-CM | POA: Diagnosis not present

## 2016-11-26 DIAGNOSIS — E11621 Type 2 diabetes mellitus with foot ulcer: Secondary | ICD-10-CM | POA: Diagnosis not present

## 2016-11-26 DIAGNOSIS — L97511 Non-pressure chronic ulcer of other part of right foot limited to breakdown of skin: Secondary | ICD-10-CM | POA: Diagnosis not present

## 2016-11-26 DIAGNOSIS — I69398 Other sequelae of cerebral infarction: Secondary | ICD-10-CM | POA: Diagnosis not present

## 2016-11-26 DIAGNOSIS — M6281 Muscle weakness (generalized): Secondary | ICD-10-CM | POA: Diagnosis not present

## 2016-11-29 DIAGNOSIS — I129 Hypertensive chronic kidney disease with stage 1 through stage 4 chronic kidney disease, or unspecified chronic kidney disease: Secondary | ICD-10-CM | POA: Diagnosis not present

## 2016-11-29 DIAGNOSIS — I255 Ischemic cardiomyopathy: Secondary | ICD-10-CM | POA: Diagnosis not present

## 2016-11-29 DIAGNOSIS — Z87891 Personal history of nicotine dependence: Secondary | ICD-10-CM | POA: Diagnosis not present

## 2016-11-29 DIAGNOSIS — I69398 Other sequelae of cerebral infarction: Secondary | ICD-10-CM | POA: Diagnosis not present

## 2016-11-29 DIAGNOSIS — E669 Obesity, unspecified: Secondary | ICD-10-CM | POA: Diagnosis not present

## 2016-11-29 DIAGNOSIS — I872 Venous insufficiency (chronic) (peripheral): Secondary | ICD-10-CM | POA: Diagnosis not present

## 2016-11-29 DIAGNOSIS — M6281 Muscle weakness (generalized): Secondary | ICD-10-CM | POA: Diagnosis not present

## 2016-11-29 DIAGNOSIS — N189 Chronic kidney disease, unspecified: Secondary | ICD-10-CM | POA: Diagnosis not present

## 2016-11-29 DIAGNOSIS — E1122 Type 2 diabetes mellitus with diabetic chronic kidney disease: Secondary | ICD-10-CM | POA: Diagnosis not present

## 2016-11-29 DIAGNOSIS — Z794 Long term (current) use of insulin: Secondary | ICD-10-CM | POA: Diagnosis not present

## 2016-11-29 DIAGNOSIS — E1165 Type 2 diabetes mellitus with hyperglycemia: Secondary | ICD-10-CM | POA: Diagnosis not present

## 2016-11-29 DIAGNOSIS — L97811 Non-pressure chronic ulcer of other part of right lower leg limited to breakdown of skin: Secondary | ICD-10-CM | POA: Diagnosis not present

## 2016-12-01 DIAGNOSIS — I129 Hypertensive chronic kidney disease with stage 1 through stage 4 chronic kidney disease, or unspecified chronic kidney disease: Secondary | ICD-10-CM | POA: Diagnosis not present

## 2016-12-01 DIAGNOSIS — E1165 Type 2 diabetes mellitus with hyperglycemia: Secondary | ICD-10-CM | POA: Diagnosis not present

## 2016-12-01 DIAGNOSIS — E1122 Type 2 diabetes mellitus with diabetic chronic kidney disease: Secondary | ICD-10-CM | POA: Diagnosis not present

## 2016-12-01 DIAGNOSIS — L97811 Non-pressure chronic ulcer of other part of right lower leg limited to breakdown of skin: Secondary | ICD-10-CM | POA: Diagnosis not present

## 2016-12-01 DIAGNOSIS — I872 Venous insufficiency (chronic) (peripheral): Secondary | ICD-10-CM | POA: Diagnosis not present

## 2016-12-01 DIAGNOSIS — N189 Chronic kidney disease, unspecified: Secondary | ICD-10-CM | POA: Diagnosis not present

## 2016-12-02 DIAGNOSIS — I872 Venous insufficiency (chronic) (peripheral): Secondary | ICD-10-CM | POA: Diagnosis not present

## 2016-12-02 DIAGNOSIS — L97811 Non-pressure chronic ulcer of other part of right lower leg limited to breakdown of skin: Secondary | ICD-10-CM | POA: Diagnosis not present

## 2016-12-02 DIAGNOSIS — L97529 Non-pressure chronic ulcer of other part of left foot with unspecified severity: Secondary | ICD-10-CM | POA: Diagnosis not present

## 2016-12-02 DIAGNOSIS — E11621 Type 2 diabetes mellitus with foot ulcer: Secondary | ICD-10-CM | POA: Diagnosis not present

## 2016-12-02 DIAGNOSIS — I83018 Varicose veins of right lower extremity with ulcer other part of lower leg: Secondary | ICD-10-CM | POA: Diagnosis not present

## 2016-12-06 DIAGNOSIS — L97811 Non-pressure chronic ulcer of other part of right lower leg limited to breakdown of skin: Secondary | ICD-10-CM | POA: Diagnosis not present

## 2016-12-06 DIAGNOSIS — I129 Hypertensive chronic kidney disease with stage 1 through stage 4 chronic kidney disease, or unspecified chronic kidney disease: Secondary | ICD-10-CM | POA: Diagnosis not present

## 2016-12-06 DIAGNOSIS — E1122 Type 2 diabetes mellitus with diabetic chronic kidney disease: Secondary | ICD-10-CM | POA: Diagnosis not present

## 2016-12-06 DIAGNOSIS — I872 Venous insufficiency (chronic) (peripheral): Secondary | ICD-10-CM | POA: Diagnosis not present

## 2016-12-06 DIAGNOSIS — E1165 Type 2 diabetes mellitus with hyperglycemia: Secondary | ICD-10-CM | POA: Diagnosis not present

## 2016-12-06 DIAGNOSIS — N189 Chronic kidney disease, unspecified: Secondary | ICD-10-CM | POA: Diagnosis not present

## 2016-12-10 DIAGNOSIS — I672 Cerebral atherosclerosis: Secondary | ICD-10-CM | POA: Diagnosis not present

## 2016-12-10 DIAGNOSIS — R4781 Slurred speech: Secondary | ICD-10-CM | POA: Diagnosis not present

## 2016-12-10 DIAGNOSIS — E1165 Type 2 diabetes mellitus with hyperglycemia: Secondary | ICD-10-CM | POA: Diagnosis not present

## 2016-12-10 DIAGNOSIS — I129 Hypertensive chronic kidney disease with stage 1 through stage 4 chronic kidney disease, or unspecified chronic kidney disease: Secondary | ICD-10-CM | POA: Diagnosis not present

## 2016-12-10 DIAGNOSIS — G459 Transient cerebral ischemic attack, unspecified: Secondary | ICD-10-CM | POA: Diagnosis not present

## 2016-12-10 DIAGNOSIS — I6789 Other cerebrovascular disease: Secondary | ICD-10-CM | POA: Diagnosis not present

## 2016-12-10 DIAGNOSIS — Z7982 Long term (current) use of aspirin: Secondary | ICD-10-CM | POA: Diagnosis not present

## 2016-12-10 DIAGNOSIS — I10 Essential (primary) hypertension: Secondary | ICD-10-CM | POA: Diagnosis not present

## 2016-12-10 DIAGNOSIS — Z79899 Other long term (current) drug therapy: Secondary | ICD-10-CM | POA: Diagnosis not present

## 2016-12-10 DIAGNOSIS — E1122 Type 2 diabetes mellitus with diabetic chronic kidney disease: Secondary | ICD-10-CM | POA: Diagnosis not present

## 2016-12-10 DIAGNOSIS — I872 Venous insufficiency (chronic) (peripheral): Secondary | ICD-10-CM | POA: Diagnosis not present

## 2016-12-10 DIAGNOSIS — Z794 Long term (current) use of insulin: Secondary | ICD-10-CM | POA: Diagnosis not present

## 2016-12-10 DIAGNOSIS — L97811 Non-pressure chronic ulcer of other part of right lower leg limited to breakdown of skin: Secondary | ICD-10-CM | POA: Diagnosis not present

## 2016-12-10 DIAGNOSIS — H53462 Homonymous bilateral field defects, left side: Secondary | ICD-10-CM | POA: Diagnosis not present

## 2016-12-10 DIAGNOSIS — Z8673 Personal history of transient ischemic attack (TIA), and cerebral infarction without residual deficits: Secondary | ICD-10-CM | POA: Diagnosis not present

## 2016-12-10 DIAGNOSIS — E119 Type 2 diabetes mellitus without complications: Secondary | ICD-10-CM | POA: Diagnosis not present

## 2016-12-10 DIAGNOSIS — Z7902 Long term (current) use of antithrombotics/antiplatelets: Secondary | ICD-10-CM | POA: Diagnosis not present

## 2016-12-10 DIAGNOSIS — N189 Chronic kidney disease, unspecified: Secondary | ICD-10-CM | POA: Diagnosis not present

## 2016-12-10 DIAGNOSIS — R29818 Other symptoms and signs involving the nervous system: Secondary | ICD-10-CM | POA: Diagnosis not present

## 2016-12-14 DIAGNOSIS — I10 Essential (primary) hypertension: Secondary | ICD-10-CM | POA: Diagnosis not present

## 2016-12-14 DIAGNOSIS — I639 Cerebral infarction, unspecified: Secondary | ICD-10-CM | POA: Diagnosis not present

## 2016-12-14 DIAGNOSIS — E119 Type 2 diabetes mellitus without complications: Secondary | ICD-10-CM | POA: Diagnosis not present

## 2016-12-16 DIAGNOSIS — I10 Essential (primary) hypertension: Secondary | ICD-10-CM | POA: Diagnosis not present

## 2016-12-16 DIAGNOSIS — E7800 Pure hypercholesterolemia, unspecified: Secondary | ICD-10-CM | POA: Diagnosis not present

## 2016-12-16 DIAGNOSIS — G459 Transient cerebral ischemic attack, unspecified: Secondary | ICD-10-CM | POA: Diagnosis not present

## 2016-12-16 DIAGNOSIS — Z6831 Body mass index (BMI) 31.0-31.9, adult: Secondary | ICD-10-CM | POA: Diagnosis not present

## 2016-12-16 DIAGNOSIS — E1165 Type 2 diabetes mellitus with hyperglycemia: Secondary | ICD-10-CM | POA: Diagnosis not present

## 2016-12-16 DIAGNOSIS — Z299 Encounter for prophylactic measures, unspecified: Secondary | ICD-10-CM | POA: Diagnosis not present

## 2016-12-18 DIAGNOSIS — L97811 Non-pressure chronic ulcer of other part of right lower leg limited to breakdown of skin: Secondary | ICD-10-CM | POA: Diagnosis not present

## 2016-12-18 DIAGNOSIS — N189 Chronic kidney disease, unspecified: Secondary | ICD-10-CM | POA: Diagnosis not present

## 2016-12-18 DIAGNOSIS — E1165 Type 2 diabetes mellitus with hyperglycemia: Secondary | ICD-10-CM | POA: Diagnosis not present

## 2016-12-18 DIAGNOSIS — I129 Hypertensive chronic kidney disease with stage 1 through stage 4 chronic kidney disease, or unspecified chronic kidney disease: Secondary | ICD-10-CM | POA: Diagnosis not present

## 2016-12-18 DIAGNOSIS — E1122 Type 2 diabetes mellitus with diabetic chronic kidney disease: Secondary | ICD-10-CM | POA: Diagnosis not present

## 2016-12-18 DIAGNOSIS — I872 Venous insufficiency (chronic) (peripheral): Secondary | ICD-10-CM | POA: Diagnosis not present

## 2016-12-24 DIAGNOSIS — R262 Difficulty in walking, not elsewhere classified: Secondary | ICD-10-CM | POA: Diagnosis not present

## 2016-12-24 DIAGNOSIS — I872 Venous insufficiency (chronic) (peripheral): Secondary | ICD-10-CM | POA: Diagnosis not present

## 2016-12-24 DIAGNOSIS — N189 Chronic kidney disease, unspecified: Secondary | ICD-10-CM | POA: Diagnosis not present

## 2016-12-24 DIAGNOSIS — I129 Hypertensive chronic kidney disease with stage 1 through stage 4 chronic kidney disease, or unspecified chronic kidney disease: Secondary | ICD-10-CM | POA: Diagnosis not present

## 2016-12-24 DIAGNOSIS — E1165 Type 2 diabetes mellitus with hyperglycemia: Secondary | ICD-10-CM | POA: Diagnosis not present

## 2016-12-24 DIAGNOSIS — M6281 Muscle weakness (generalized): Secondary | ICD-10-CM | POA: Diagnosis not present

## 2016-12-24 DIAGNOSIS — E1122 Type 2 diabetes mellitus with diabetic chronic kidney disease: Secondary | ICD-10-CM | POA: Diagnosis not present

## 2016-12-24 DIAGNOSIS — L97811 Non-pressure chronic ulcer of other part of right lower leg limited to breakdown of skin: Secondary | ICD-10-CM | POA: Diagnosis not present

## 2016-12-28 DIAGNOSIS — M6281 Muscle weakness (generalized): Secondary | ICD-10-CM | POA: Diagnosis not present

## 2016-12-28 DIAGNOSIS — R262 Difficulty in walking, not elsewhere classified: Secondary | ICD-10-CM | POA: Diagnosis not present

## 2016-12-29 DIAGNOSIS — I83018 Varicose veins of right lower extremity with ulcer other part of lower leg: Secondary | ICD-10-CM | POA: Diagnosis not present

## 2016-12-29 DIAGNOSIS — L97811 Non-pressure chronic ulcer of other part of right lower leg limited to breakdown of skin: Secondary | ICD-10-CM | POA: Diagnosis not present

## 2016-12-29 DIAGNOSIS — I872 Venous insufficiency (chronic) (peripheral): Secondary | ICD-10-CM | POA: Diagnosis not present

## 2016-12-31 DIAGNOSIS — L97811 Non-pressure chronic ulcer of other part of right lower leg limited to breakdown of skin: Secondary | ICD-10-CM | POA: Diagnosis not present

## 2016-12-31 DIAGNOSIS — I872 Venous insufficiency (chronic) (peripheral): Secondary | ICD-10-CM | POA: Diagnosis not present

## 2016-12-31 DIAGNOSIS — E1165 Type 2 diabetes mellitus with hyperglycemia: Secondary | ICD-10-CM | POA: Diagnosis not present

## 2016-12-31 DIAGNOSIS — N189 Chronic kidney disease, unspecified: Secondary | ICD-10-CM | POA: Diagnosis not present

## 2016-12-31 DIAGNOSIS — I129 Hypertensive chronic kidney disease with stage 1 through stage 4 chronic kidney disease, or unspecified chronic kidney disease: Secondary | ICD-10-CM | POA: Diagnosis not present

## 2016-12-31 DIAGNOSIS — E1122 Type 2 diabetes mellitus with diabetic chronic kidney disease: Secondary | ICD-10-CM | POA: Diagnosis not present

## 2017-01-03 DIAGNOSIS — E1165 Type 2 diabetes mellitus with hyperglycemia: Secondary | ICD-10-CM | POA: Diagnosis not present

## 2017-01-03 DIAGNOSIS — N189 Chronic kidney disease, unspecified: Secondary | ICD-10-CM | POA: Diagnosis not present

## 2017-01-03 DIAGNOSIS — I872 Venous insufficiency (chronic) (peripheral): Secondary | ICD-10-CM | POA: Diagnosis not present

## 2017-01-03 DIAGNOSIS — L97811 Non-pressure chronic ulcer of other part of right lower leg limited to breakdown of skin: Secondary | ICD-10-CM | POA: Diagnosis not present

## 2017-01-03 DIAGNOSIS — E1122 Type 2 diabetes mellitus with diabetic chronic kidney disease: Secondary | ICD-10-CM | POA: Diagnosis not present

## 2017-01-03 DIAGNOSIS — I129 Hypertensive chronic kidney disease with stage 1 through stage 4 chronic kidney disease, or unspecified chronic kidney disease: Secondary | ICD-10-CM | POA: Diagnosis not present

## 2017-01-11 DIAGNOSIS — I129 Hypertensive chronic kidney disease with stage 1 through stage 4 chronic kidney disease, or unspecified chronic kidney disease: Secondary | ICD-10-CM | POA: Diagnosis not present

## 2017-01-11 DIAGNOSIS — I872 Venous insufficiency (chronic) (peripheral): Secondary | ICD-10-CM | POA: Diagnosis not present

## 2017-01-11 DIAGNOSIS — E1122 Type 2 diabetes mellitus with diabetic chronic kidney disease: Secondary | ICD-10-CM | POA: Diagnosis not present

## 2017-01-11 DIAGNOSIS — E1165 Type 2 diabetes mellitus with hyperglycemia: Secondary | ICD-10-CM | POA: Diagnosis not present

## 2017-01-11 DIAGNOSIS — L97811 Non-pressure chronic ulcer of other part of right lower leg limited to breakdown of skin: Secondary | ICD-10-CM | POA: Diagnosis not present

## 2017-01-11 DIAGNOSIS — N189 Chronic kidney disease, unspecified: Secondary | ICD-10-CM | POA: Diagnosis not present

## 2017-01-12 DIAGNOSIS — I10 Essential (primary) hypertension: Secondary | ICD-10-CM | POA: Diagnosis not present

## 2017-01-12 DIAGNOSIS — E119 Type 2 diabetes mellitus without complications: Secondary | ICD-10-CM | POA: Diagnosis not present

## 2017-01-12 DIAGNOSIS — I639 Cerebral infarction, unspecified: Secondary | ICD-10-CM | POA: Diagnosis not present

## 2017-01-18 DIAGNOSIS — L97811 Non-pressure chronic ulcer of other part of right lower leg limited to breakdown of skin: Secondary | ICD-10-CM | POA: Diagnosis not present

## 2017-01-18 DIAGNOSIS — E1122 Type 2 diabetes mellitus with diabetic chronic kidney disease: Secondary | ICD-10-CM | POA: Diagnosis not present

## 2017-01-18 DIAGNOSIS — I872 Venous insufficiency (chronic) (peripheral): Secondary | ICD-10-CM | POA: Diagnosis not present

## 2017-01-18 DIAGNOSIS — I129 Hypertensive chronic kidney disease with stage 1 through stage 4 chronic kidney disease, or unspecified chronic kidney disease: Secondary | ICD-10-CM | POA: Diagnosis not present

## 2017-01-18 DIAGNOSIS — N189 Chronic kidney disease, unspecified: Secondary | ICD-10-CM | POA: Diagnosis not present

## 2017-01-18 DIAGNOSIS — E1165 Type 2 diabetes mellitus with hyperglycemia: Secondary | ICD-10-CM | POA: Diagnosis not present

## 2017-01-25 DIAGNOSIS — E1165 Type 2 diabetes mellitus with hyperglycemia: Secondary | ICD-10-CM | POA: Diagnosis not present

## 2017-01-25 DIAGNOSIS — I129 Hypertensive chronic kidney disease with stage 1 through stage 4 chronic kidney disease, or unspecified chronic kidney disease: Secondary | ICD-10-CM | POA: Diagnosis not present

## 2017-01-25 DIAGNOSIS — L97811 Non-pressure chronic ulcer of other part of right lower leg limited to breakdown of skin: Secondary | ICD-10-CM | POA: Diagnosis not present

## 2017-01-25 DIAGNOSIS — N189 Chronic kidney disease, unspecified: Secondary | ICD-10-CM | POA: Diagnosis not present

## 2017-01-25 DIAGNOSIS — I872 Venous insufficiency (chronic) (peripheral): Secondary | ICD-10-CM | POA: Diagnosis not present

## 2017-01-25 DIAGNOSIS — E1122 Type 2 diabetes mellitus with diabetic chronic kidney disease: Secondary | ICD-10-CM | POA: Diagnosis not present

## 2017-01-26 DIAGNOSIS — I83018 Varicose veins of right lower extremity with ulcer other part of lower leg: Secondary | ICD-10-CM | POA: Diagnosis not present

## 2017-01-26 DIAGNOSIS — L97811 Non-pressure chronic ulcer of other part of right lower leg limited to breakdown of skin: Secondary | ICD-10-CM | POA: Diagnosis not present

## 2017-01-26 DIAGNOSIS — Z872 Personal history of diseases of the skin and subcutaneous tissue: Secondary | ICD-10-CM | POA: Diagnosis not present

## 2017-01-26 DIAGNOSIS — Z09 Encounter for follow-up examination after completed treatment for conditions other than malignant neoplasm: Secondary | ICD-10-CM | POA: Diagnosis not present

## 2017-02-02 DIAGNOSIS — Z713 Dietary counseling and surveillance: Secondary | ICD-10-CM | POA: Diagnosis not present

## 2017-02-02 DIAGNOSIS — Z6832 Body mass index (BMI) 32.0-32.9, adult: Secondary | ICD-10-CM | POA: Diagnosis not present

## 2017-02-02 DIAGNOSIS — I739 Peripheral vascular disease, unspecified: Secondary | ICD-10-CM | POA: Diagnosis not present

## 2017-02-02 DIAGNOSIS — Z299 Encounter for prophylactic measures, unspecified: Secondary | ICD-10-CM | POA: Diagnosis not present

## 2017-02-02 DIAGNOSIS — E1165 Type 2 diabetes mellitus with hyperglycemia: Secondary | ICD-10-CM | POA: Diagnosis not present

## 2017-02-08 DIAGNOSIS — I639 Cerebral infarction, unspecified: Secondary | ICD-10-CM

## 2017-02-08 HISTORY — DX: Cerebral infarction, unspecified: I63.9

## 2017-02-16 DIAGNOSIS — E113493 Type 2 diabetes mellitus with severe nonproliferative diabetic retinopathy without macular edema, bilateral: Secondary | ICD-10-CM | POA: Diagnosis not present

## 2017-02-16 DIAGNOSIS — H43813 Vitreous degeneration, bilateral: Secondary | ICD-10-CM | POA: Diagnosis not present

## 2017-02-16 DIAGNOSIS — H3582 Retinal ischemia: Secondary | ICD-10-CM | POA: Diagnosis not present

## 2017-02-16 DIAGNOSIS — H35373 Puckering of macula, bilateral: Secondary | ICD-10-CM | POA: Diagnosis not present

## 2017-03-03 DIAGNOSIS — L97811 Non-pressure chronic ulcer of other part of right lower leg limited to breakdown of skin: Secondary | ICD-10-CM | POA: Diagnosis not present

## 2017-03-03 DIAGNOSIS — E119 Type 2 diabetes mellitus without complications: Secondary | ICD-10-CM | POA: Diagnosis not present

## 2017-03-03 DIAGNOSIS — I83018 Varicose veins of right lower extremity with ulcer other part of lower leg: Secondary | ICD-10-CM | POA: Diagnosis not present

## 2017-03-03 DIAGNOSIS — I872 Venous insufficiency (chronic) (peripheral): Secondary | ICD-10-CM | POA: Diagnosis not present

## 2017-03-10 DIAGNOSIS — E119 Type 2 diabetes mellitus without complications: Secondary | ICD-10-CM | POA: Diagnosis not present

## 2017-03-10 DIAGNOSIS — I83018 Varicose veins of right lower extremity with ulcer other part of lower leg: Secondary | ICD-10-CM | POA: Diagnosis not present

## 2017-03-10 DIAGNOSIS — I872 Venous insufficiency (chronic) (peripheral): Secondary | ICD-10-CM | POA: Diagnosis not present

## 2017-03-10 DIAGNOSIS — L97811 Non-pressure chronic ulcer of other part of right lower leg limited to breakdown of skin: Secondary | ICD-10-CM | POA: Diagnosis not present

## 2017-03-14 DIAGNOSIS — E119 Type 2 diabetes mellitus without complications: Secondary | ICD-10-CM | POA: Diagnosis not present

## 2017-03-14 DIAGNOSIS — I10 Essential (primary) hypertension: Secondary | ICD-10-CM | POA: Diagnosis not present

## 2017-03-14 DIAGNOSIS — I639 Cerebral infarction, unspecified: Secondary | ICD-10-CM | POA: Diagnosis not present

## 2017-03-16 DIAGNOSIS — S80812A Abrasion, left lower leg, initial encounter: Secondary | ICD-10-CM | POA: Diagnosis not present

## 2017-03-16 DIAGNOSIS — E119 Type 2 diabetes mellitus without complications: Secondary | ICD-10-CM | POA: Diagnosis not present

## 2017-03-16 DIAGNOSIS — L97819 Non-pressure chronic ulcer of other part of right lower leg with unspecified severity: Secondary | ICD-10-CM | POA: Diagnosis not present

## 2017-03-16 DIAGNOSIS — L97811 Non-pressure chronic ulcer of other part of right lower leg limited to breakdown of skin: Secondary | ICD-10-CM | POA: Diagnosis not present

## 2017-03-16 DIAGNOSIS — I83018 Varicose veins of right lower extremity with ulcer other part of lower leg: Secondary | ICD-10-CM | POA: Diagnosis not present

## 2017-03-23 DIAGNOSIS — I83018 Varicose veins of right lower extremity with ulcer other part of lower leg: Secondary | ICD-10-CM | POA: Diagnosis not present

## 2017-03-23 DIAGNOSIS — E119 Type 2 diabetes mellitus without complications: Secondary | ICD-10-CM | POA: Diagnosis not present

## 2017-03-23 DIAGNOSIS — L97811 Non-pressure chronic ulcer of other part of right lower leg limited to breakdown of skin: Secondary | ICD-10-CM | POA: Diagnosis not present

## 2017-03-23 DIAGNOSIS — L97819 Non-pressure chronic ulcer of other part of right lower leg with unspecified severity: Secondary | ICD-10-CM | POA: Diagnosis not present

## 2017-03-23 DIAGNOSIS — S80812A Abrasion, left lower leg, initial encounter: Secondary | ICD-10-CM | POA: Diagnosis not present

## 2017-04-11 DIAGNOSIS — M79675 Pain in left toe(s): Secondary | ICD-10-CM | POA: Diagnosis not present

## 2017-04-11 DIAGNOSIS — L851 Acquired keratosis [keratoderma] palmaris et plantaris: Secondary | ICD-10-CM | POA: Diagnosis not present

## 2017-04-11 DIAGNOSIS — B351 Tinea unguium: Secondary | ICD-10-CM | POA: Diagnosis not present

## 2017-04-11 DIAGNOSIS — M79674 Pain in right toe(s): Secondary | ICD-10-CM | POA: Diagnosis not present

## 2017-04-11 DIAGNOSIS — E1151 Type 2 diabetes mellitus with diabetic peripheral angiopathy without gangrene: Secondary | ICD-10-CM | POA: Diagnosis not present

## 2017-04-12 DIAGNOSIS — L97818 Non-pressure chronic ulcer of other part of right lower leg with other specified severity: Secondary | ICD-10-CM | POA: Diagnosis not present

## 2017-04-12 DIAGNOSIS — L97811 Non-pressure chronic ulcer of other part of right lower leg limited to breakdown of skin: Secondary | ICD-10-CM | POA: Diagnosis not present

## 2017-04-12 DIAGNOSIS — S80811S Abrasion, right lower leg, sequela: Secondary | ICD-10-CM | POA: Diagnosis not present

## 2017-04-12 DIAGNOSIS — I83018 Varicose veins of right lower extremity with ulcer other part of lower leg: Secondary | ICD-10-CM | POA: Diagnosis not present

## 2017-04-13 DIAGNOSIS — E119 Type 2 diabetes mellitus without complications: Secondary | ICD-10-CM | POA: Diagnosis not present

## 2017-04-13 DIAGNOSIS — I639 Cerebral infarction, unspecified: Secondary | ICD-10-CM | POA: Diagnosis not present

## 2017-04-13 DIAGNOSIS — I10 Essential (primary) hypertension: Secondary | ICD-10-CM | POA: Diagnosis not present

## 2017-04-19 DIAGNOSIS — I83018 Varicose veins of right lower extremity with ulcer other part of lower leg: Secondary | ICD-10-CM | POA: Diagnosis not present

## 2017-04-19 DIAGNOSIS — S80811S Abrasion, right lower leg, sequela: Secondary | ICD-10-CM | POA: Diagnosis not present

## 2017-04-19 DIAGNOSIS — L97818 Non-pressure chronic ulcer of other part of right lower leg with other specified severity: Secondary | ICD-10-CM | POA: Diagnosis not present

## 2017-04-19 DIAGNOSIS — L97812 Non-pressure chronic ulcer of other part of right lower leg with fat layer exposed: Secondary | ICD-10-CM | POA: Diagnosis not present

## 2017-05-03 DIAGNOSIS — E113493 Type 2 diabetes mellitus with severe nonproliferative diabetic retinopathy without macular edema, bilateral: Secondary | ICD-10-CM | POA: Diagnosis not present

## 2017-05-03 DIAGNOSIS — H25042 Posterior subcapsular polar age-related cataract, left eye: Secondary | ICD-10-CM | POA: Diagnosis not present

## 2017-05-03 DIAGNOSIS — Z961 Presence of intraocular lens: Secondary | ICD-10-CM | POA: Diagnosis not present

## 2017-05-03 DIAGNOSIS — H25012 Cortical age-related cataract, left eye: Secondary | ICD-10-CM | POA: Diagnosis not present

## 2017-05-03 DIAGNOSIS — H2512 Age-related nuclear cataract, left eye: Secondary | ICD-10-CM | POA: Diagnosis not present

## 2017-05-09 DIAGNOSIS — I739 Peripheral vascular disease, unspecified: Secondary | ICD-10-CM | POA: Diagnosis not present

## 2017-05-09 DIAGNOSIS — E1165 Type 2 diabetes mellitus with hyperglycemia: Secondary | ICD-10-CM | POA: Diagnosis not present

## 2017-05-09 DIAGNOSIS — Z299 Encounter for prophylactic measures, unspecified: Secondary | ICD-10-CM | POA: Diagnosis not present

## 2017-05-09 DIAGNOSIS — Z713 Dietary counseling and surveillance: Secondary | ICD-10-CM | POA: Diagnosis not present

## 2017-05-09 DIAGNOSIS — Z6832 Body mass index (BMI) 32.0-32.9, adult: Secondary | ICD-10-CM | POA: Diagnosis not present

## 2017-05-11 DIAGNOSIS — Z125 Encounter for screening for malignant neoplasm of prostate: Secondary | ICD-10-CM | POA: Diagnosis not present

## 2017-05-11 DIAGNOSIS — R5383 Other fatigue: Secondary | ICD-10-CM | POA: Diagnosis not present

## 2017-05-11 DIAGNOSIS — F419 Anxiety disorder, unspecified: Secondary | ICD-10-CM | POA: Diagnosis not present

## 2017-05-11 DIAGNOSIS — Z79899 Other long term (current) drug therapy: Secondary | ICD-10-CM | POA: Diagnosis not present

## 2017-05-11 DIAGNOSIS — E7800 Pure hypercholesterolemia, unspecified: Secondary | ICD-10-CM | POA: Diagnosis not present

## 2017-05-12 DIAGNOSIS — Z794 Long term (current) use of insulin: Secondary | ICD-10-CM | POA: Diagnosis not present

## 2017-05-12 DIAGNOSIS — Z8673 Personal history of transient ischemic attack (TIA), and cerebral infarction without residual deficits: Secondary | ICD-10-CM | POA: Diagnosis not present

## 2017-05-12 DIAGNOSIS — R4182 Altered mental status, unspecified: Secondary | ICD-10-CM | POA: Diagnosis not present

## 2017-05-12 DIAGNOSIS — G459 Transient cerebral ischemic attack, unspecified: Secondary | ICD-10-CM | POA: Diagnosis not present

## 2017-05-12 DIAGNOSIS — Z7982 Long term (current) use of aspirin: Secondary | ICD-10-CM | POA: Diagnosis not present

## 2017-05-12 DIAGNOSIS — Z7902 Long term (current) use of antithrombotics/antiplatelets: Secondary | ICD-10-CM | POA: Diagnosis not present

## 2017-05-12 DIAGNOSIS — I10 Essential (primary) hypertension: Secondary | ICD-10-CM | POA: Diagnosis not present

## 2017-05-12 DIAGNOSIS — R402421 Glasgow coma scale score 9-12, in the field [EMT or ambulance]: Secondary | ICD-10-CM | POA: Diagnosis not present

## 2017-05-12 DIAGNOSIS — Z79899 Other long term (current) drug therapy: Secondary | ICD-10-CM | POA: Diagnosis not present

## 2017-05-12 DIAGNOSIS — E119 Type 2 diabetes mellitus without complications: Secondary | ICD-10-CM | POA: Diagnosis not present

## 2017-05-27 DIAGNOSIS — I639 Cerebral infarction, unspecified: Secondary | ICD-10-CM | POA: Diagnosis not present

## 2017-05-27 DIAGNOSIS — I10 Essential (primary) hypertension: Secondary | ICD-10-CM | POA: Diagnosis not present

## 2017-05-27 DIAGNOSIS — E119 Type 2 diabetes mellitus without complications: Secondary | ICD-10-CM | POA: Diagnosis not present

## 2017-06-01 DIAGNOSIS — I83018 Varicose veins of right lower extremity with ulcer other part of lower leg: Secondary | ICD-10-CM | POA: Diagnosis not present

## 2017-06-01 DIAGNOSIS — I83019 Varicose veins of right lower extremity with ulcer of unspecified site: Secondary | ICD-10-CM | POA: Diagnosis not present

## 2017-06-01 DIAGNOSIS — L97811 Non-pressure chronic ulcer of other part of right lower leg limited to breakdown of skin: Secondary | ICD-10-CM | POA: Diagnosis not present

## 2017-06-01 DIAGNOSIS — L97918 Non-pressure chronic ulcer of unspecified part of right lower leg with other specified severity: Secondary | ICD-10-CM | POA: Diagnosis not present

## 2017-06-01 DIAGNOSIS — E119 Type 2 diabetes mellitus without complications: Secondary | ICD-10-CM | POA: Diagnosis not present

## 2017-06-02 DIAGNOSIS — E1165 Type 2 diabetes mellitus with hyperglycemia: Secondary | ICD-10-CM | POA: Diagnosis not present

## 2017-06-02 DIAGNOSIS — I872 Venous insufficiency (chronic) (peripheral): Secondary | ICD-10-CM | POA: Diagnosis not present

## 2017-06-02 DIAGNOSIS — L97811 Non-pressure chronic ulcer of other part of right lower leg limited to breakdown of skin: Secondary | ICD-10-CM | POA: Diagnosis not present

## 2017-06-02 DIAGNOSIS — F0390 Unspecified dementia without behavioral disturbance: Secondary | ICD-10-CM | POA: Diagnosis not present

## 2017-06-02 DIAGNOSIS — I10 Essential (primary) hypertension: Secondary | ICD-10-CM | POA: Diagnosis not present

## 2017-06-02 DIAGNOSIS — I83018 Varicose veins of right lower extremity with ulcer other part of lower leg: Secondary | ICD-10-CM | POA: Diagnosis not present

## 2017-06-02 DIAGNOSIS — Z7982 Long term (current) use of aspirin: Secondary | ICD-10-CM | POA: Diagnosis not present

## 2017-06-02 DIAGNOSIS — L97212 Non-pressure chronic ulcer of right calf with fat layer exposed: Secondary | ICD-10-CM | POA: Diagnosis not present

## 2017-06-06 DIAGNOSIS — Z7982 Long term (current) use of aspirin: Secondary | ICD-10-CM | POA: Diagnosis not present

## 2017-06-06 DIAGNOSIS — F0390 Unspecified dementia without behavioral disturbance: Secondary | ICD-10-CM | POA: Diagnosis not present

## 2017-06-06 DIAGNOSIS — I872 Venous insufficiency (chronic) (peripheral): Secondary | ICD-10-CM | POA: Diagnosis not present

## 2017-06-06 DIAGNOSIS — L97212 Non-pressure chronic ulcer of right calf with fat layer exposed: Secondary | ICD-10-CM | POA: Diagnosis not present

## 2017-06-06 DIAGNOSIS — E1165 Type 2 diabetes mellitus with hyperglycemia: Secondary | ICD-10-CM | POA: Diagnosis not present

## 2017-06-06 DIAGNOSIS — I10 Essential (primary) hypertension: Secondary | ICD-10-CM | POA: Diagnosis not present

## 2017-06-09 DIAGNOSIS — Z7982 Long term (current) use of aspirin: Secondary | ICD-10-CM | POA: Diagnosis not present

## 2017-06-09 DIAGNOSIS — L97212 Non-pressure chronic ulcer of right calf with fat layer exposed: Secondary | ICD-10-CM | POA: Diagnosis not present

## 2017-06-09 DIAGNOSIS — I872 Venous insufficiency (chronic) (peripheral): Secondary | ICD-10-CM | POA: Diagnosis not present

## 2017-06-09 DIAGNOSIS — F0390 Unspecified dementia without behavioral disturbance: Secondary | ICD-10-CM | POA: Diagnosis not present

## 2017-06-09 DIAGNOSIS — I10 Essential (primary) hypertension: Secondary | ICD-10-CM | POA: Diagnosis not present

## 2017-06-09 DIAGNOSIS — E1165 Type 2 diabetes mellitus with hyperglycemia: Secondary | ICD-10-CM | POA: Diagnosis not present

## 2017-06-15 DIAGNOSIS — S90421A Blister (nonthermal), right great toe, initial encounter: Secondary | ICD-10-CM | POA: Diagnosis not present

## 2017-06-15 DIAGNOSIS — E1140 Type 2 diabetes mellitus with diabetic neuropathy, unspecified: Secondary | ICD-10-CM | POA: Diagnosis not present

## 2017-06-15 DIAGNOSIS — I872 Venous insufficiency (chronic) (peripheral): Secondary | ICD-10-CM | POA: Diagnosis not present

## 2017-06-15 DIAGNOSIS — L97811 Non-pressure chronic ulcer of other part of right lower leg limited to breakdown of skin: Secondary | ICD-10-CM | POA: Diagnosis not present

## 2017-06-15 DIAGNOSIS — I83018 Varicose veins of right lower extremity with ulcer other part of lower leg: Secondary | ICD-10-CM | POA: Diagnosis not present

## 2017-06-20 DIAGNOSIS — E119 Type 2 diabetes mellitus without complications: Secondary | ICD-10-CM | POA: Diagnosis not present

## 2017-06-20 DIAGNOSIS — M79674 Pain in right toe(s): Secondary | ICD-10-CM | POA: Diagnosis not present

## 2017-06-20 DIAGNOSIS — B351 Tinea unguium: Secondary | ICD-10-CM | POA: Diagnosis not present

## 2017-06-20 DIAGNOSIS — I639 Cerebral infarction, unspecified: Secondary | ICD-10-CM | POA: Diagnosis not present

## 2017-06-20 DIAGNOSIS — E1151 Type 2 diabetes mellitus with diabetic peripheral angiopathy without gangrene: Secondary | ICD-10-CM | POA: Diagnosis not present

## 2017-06-20 DIAGNOSIS — I10 Essential (primary) hypertension: Secondary | ICD-10-CM | POA: Diagnosis not present

## 2017-06-20 DIAGNOSIS — M79675 Pain in left toe(s): Secondary | ICD-10-CM | POA: Diagnosis not present

## 2017-06-21 DIAGNOSIS — L97811 Non-pressure chronic ulcer of other part of right lower leg limited to breakdown of skin: Secondary | ICD-10-CM | POA: Diagnosis not present

## 2017-06-21 DIAGNOSIS — E1140 Type 2 diabetes mellitus with diabetic neuropathy, unspecified: Secondary | ICD-10-CM | POA: Diagnosis not present

## 2017-06-21 DIAGNOSIS — I872 Venous insufficiency (chronic) (peripheral): Secondary | ICD-10-CM | POA: Diagnosis not present

## 2017-06-21 DIAGNOSIS — I83018 Varicose veins of right lower extremity with ulcer other part of lower leg: Secondary | ICD-10-CM | POA: Diagnosis not present

## 2017-06-21 DIAGNOSIS — S90421A Blister (nonthermal), right great toe, initial encounter: Secondary | ICD-10-CM | POA: Diagnosis not present

## 2017-06-22 DIAGNOSIS — F0390 Unspecified dementia without behavioral disturbance: Secondary | ICD-10-CM | POA: Diagnosis not present

## 2017-06-22 DIAGNOSIS — L97212 Non-pressure chronic ulcer of right calf with fat layer exposed: Secondary | ICD-10-CM | POA: Diagnosis not present

## 2017-06-22 DIAGNOSIS — I872 Venous insufficiency (chronic) (peripheral): Secondary | ICD-10-CM | POA: Diagnosis not present

## 2017-06-22 DIAGNOSIS — I10 Essential (primary) hypertension: Secondary | ICD-10-CM | POA: Diagnosis not present

## 2017-06-22 DIAGNOSIS — E1165 Type 2 diabetes mellitus with hyperglycemia: Secondary | ICD-10-CM | POA: Diagnosis not present

## 2017-06-22 DIAGNOSIS — Z7982 Long term (current) use of aspirin: Secondary | ICD-10-CM | POA: Diagnosis not present

## 2017-06-30 DIAGNOSIS — I83018 Varicose veins of right lower extremity with ulcer other part of lower leg: Secondary | ICD-10-CM | POA: Diagnosis not present

## 2017-06-30 DIAGNOSIS — S90421A Blister (nonthermal), right great toe, initial encounter: Secondary | ICD-10-CM | POA: Diagnosis not present

## 2017-06-30 DIAGNOSIS — L97811 Non-pressure chronic ulcer of other part of right lower leg limited to breakdown of skin: Secondary | ICD-10-CM | POA: Diagnosis not present

## 2017-06-30 DIAGNOSIS — I872 Venous insufficiency (chronic) (peripheral): Secondary | ICD-10-CM | POA: Diagnosis not present

## 2017-06-30 DIAGNOSIS — E1140 Type 2 diabetes mellitus with diabetic neuropathy, unspecified: Secondary | ICD-10-CM | POA: Diagnosis not present

## 2017-07-04 DIAGNOSIS — H2512 Age-related nuclear cataract, left eye: Secondary | ICD-10-CM | POA: Diagnosis not present

## 2017-07-06 DIAGNOSIS — E1165 Type 2 diabetes mellitus with hyperglycemia: Secondary | ICD-10-CM | POA: Diagnosis not present

## 2017-07-06 DIAGNOSIS — Z7982 Long term (current) use of aspirin: Secondary | ICD-10-CM | POA: Diagnosis not present

## 2017-07-06 DIAGNOSIS — I872 Venous insufficiency (chronic) (peripheral): Secondary | ICD-10-CM | POA: Diagnosis not present

## 2017-07-06 DIAGNOSIS — L97212 Non-pressure chronic ulcer of right calf with fat layer exposed: Secondary | ICD-10-CM | POA: Diagnosis not present

## 2017-07-06 DIAGNOSIS — I10 Essential (primary) hypertension: Secondary | ICD-10-CM | POA: Diagnosis not present

## 2017-07-06 DIAGNOSIS — F0390 Unspecified dementia without behavioral disturbance: Secondary | ICD-10-CM | POA: Diagnosis not present

## 2017-07-18 DIAGNOSIS — I10 Essential (primary) hypertension: Secondary | ICD-10-CM | POA: Diagnosis not present

## 2017-07-18 DIAGNOSIS — Z7982 Long term (current) use of aspirin: Secondary | ICD-10-CM | POA: Diagnosis not present

## 2017-07-18 DIAGNOSIS — F0390 Unspecified dementia without behavioral disturbance: Secondary | ICD-10-CM | POA: Diagnosis not present

## 2017-07-18 DIAGNOSIS — I872 Venous insufficiency (chronic) (peripheral): Secondary | ICD-10-CM | POA: Diagnosis not present

## 2017-07-18 DIAGNOSIS — E1165 Type 2 diabetes mellitus with hyperglycemia: Secondary | ICD-10-CM | POA: Diagnosis not present

## 2017-07-18 DIAGNOSIS — L97212 Non-pressure chronic ulcer of right calf with fat layer exposed: Secondary | ICD-10-CM | POA: Diagnosis not present

## 2017-07-27 DIAGNOSIS — F0390 Unspecified dementia without behavioral disturbance: Secondary | ICD-10-CM | POA: Diagnosis not present

## 2017-07-27 DIAGNOSIS — I872 Venous insufficiency (chronic) (peripheral): Secondary | ICD-10-CM | POA: Diagnosis not present

## 2017-07-27 DIAGNOSIS — I639 Cerebral infarction, unspecified: Secondary | ICD-10-CM | POA: Diagnosis not present

## 2017-07-27 DIAGNOSIS — E1165 Type 2 diabetes mellitus with hyperglycemia: Secondary | ICD-10-CM | POA: Diagnosis not present

## 2017-07-27 DIAGNOSIS — L97212 Non-pressure chronic ulcer of right calf with fat layer exposed: Secondary | ICD-10-CM | POA: Diagnosis not present

## 2017-07-27 DIAGNOSIS — Z7982 Long term (current) use of aspirin: Secondary | ICD-10-CM | POA: Diagnosis not present

## 2017-07-27 DIAGNOSIS — E119 Type 2 diabetes mellitus without complications: Secondary | ICD-10-CM | POA: Diagnosis not present

## 2017-07-27 DIAGNOSIS — I10 Essential (primary) hypertension: Secondary | ICD-10-CM | POA: Diagnosis not present

## 2017-08-15 DIAGNOSIS — R4189 Other symptoms and signs involving cognitive functions and awareness: Secondary | ICD-10-CM | POA: Diagnosis not present

## 2017-08-15 DIAGNOSIS — L97529 Non-pressure chronic ulcer of other part of left foot with unspecified severity: Secondary | ICD-10-CM | POA: Diagnosis not present

## 2017-08-15 DIAGNOSIS — Z299 Encounter for prophylactic measures, unspecified: Secondary | ICD-10-CM | POA: Diagnosis not present

## 2017-08-15 DIAGNOSIS — E1165 Type 2 diabetes mellitus with hyperglycemia: Secondary | ICD-10-CM | POA: Diagnosis not present

## 2017-08-15 DIAGNOSIS — I10 Essential (primary) hypertension: Secondary | ICD-10-CM | POA: Diagnosis not present

## 2017-08-15 DIAGNOSIS — Z6833 Body mass index (BMI) 33.0-33.9, adult: Secondary | ICD-10-CM | POA: Diagnosis not present

## 2017-08-15 DIAGNOSIS — I739 Peripheral vascular disease, unspecified: Secondary | ICD-10-CM | POA: Diagnosis not present

## 2017-08-17 ENCOUNTER — Encounter: Payer: Self-pay | Admitting: Neurology

## 2017-08-17 DIAGNOSIS — E113493 Type 2 diabetes mellitus with severe nonproliferative diabetic retinopathy without macular edema, bilateral: Secondary | ICD-10-CM | POA: Diagnosis not present

## 2017-08-17 DIAGNOSIS — H35373 Puckering of macula, bilateral: Secondary | ICD-10-CM | POA: Diagnosis not present

## 2017-08-17 DIAGNOSIS — H43813 Vitreous degeneration, bilateral: Secondary | ICD-10-CM | POA: Diagnosis not present

## 2017-08-17 DIAGNOSIS — H3582 Retinal ischemia: Secondary | ICD-10-CM | POA: Diagnosis not present

## 2017-08-29 DIAGNOSIS — M79675 Pain in left toe(s): Secondary | ICD-10-CM | POA: Diagnosis not present

## 2017-08-29 DIAGNOSIS — E1151 Type 2 diabetes mellitus with diabetic peripheral angiopathy without gangrene: Secondary | ICD-10-CM | POA: Diagnosis not present

## 2017-08-29 DIAGNOSIS — B351 Tinea unguium: Secondary | ICD-10-CM | POA: Diagnosis not present

## 2017-08-29 DIAGNOSIS — M79674 Pain in right toe(s): Secondary | ICD-10-CM | POA: Diagnosis not present

## 2017-08-30 DIAGNOSIS — I10 Essential (primary) hypertension: Secondary | ICD-10-CM | POA: Diagnosis not present

## 2017-08-30 DIAGNOSIS — I639 Cerebral infarction, unspecified: Secondary | ICD-10-CM | POA: Diagnosis not present

## 2017-08-30 DIAGNOSIS — E119 Type 2 diabetes mellitus without complications: Secondary | ICD-10-CM | POA: Diagnosis not present

## 2017-09-21 DIAGNOSIS — Z79899 Other long term (current) drug therapy: Secondary | ICD-10-CM | POA: Diagnosis not present

## 2017-09-21 DIAGNOSIS — E119 Type 2 diabetes mellitus without complications: Secondary | ICD-10-CM | POA: Diagnosis not present

## 2017-09-21 DIAGNOSIS — Z794 Long term (current) use of insulin: Secondary | ICD-10-CM | POA: Diagnosis not present

## 2017-09-21 DIAGNOSIS — Z87891 Personal history of nicotine dependence: Secondary | ICD-10-CM | POA: Diagnosis not present

## 2017-09-21 DIAGNOSIS — I739 Peripheral vascular disease, unspecified: Secondary | ICD-10-CM | POA: Diagnosis not present

## 2017-09-21 DIAGNOSIS — K529 Noninfective gastroenteritis and colitis, unspecified: Secondary | ICD-10-CM | POA: Diagnosis not present

## 2017-09-21 DIAGNOSIS — Z8673 Personal history of transient ischemic attack (TIA), and cerebral infarction without residual deficits: Secondary | ICD-10-CM | POA: Diagnosis not present

## 2017-09-21 DIAGNOSIS — R42 Dizziness and giddiness: Secondary | ICD-10-CM | POA: Diagnosis not present

## 2017-09-21 DIAGNOSIS — Z7902 Long term (current) use of antithrombotics/antiplatelets: Secondary | ICD-10-CM | POA: Diagnosis not present

## 2017-09-21 DIAGNOSIS — E7800 Pure hypercholesterolemia, unspecified: Secondary | ICD-10-CM | POA: Diagnosis not present

## 2017-09-21 DIAGNOSIS — R531 Weakness: Secondary | ICD-10-CM | POA: Diagnosis not present

## 2017-09-21 DIAGNOSIS — R2681 Unsteadiness on feet: Secondary | ICD-10-CM | POA: Diagnosis not present

## 2017-09-21 DIAGNOSIS — E860 Dehydration: Secondary | ICD-10-CM | POA: Diagnosis not present

## 2017-09-21 DIAGNOSIS — I10 Essential (primary) hypertension: Secondary | ICD-10-CM | POA: Diagnosis not present

## 2017-09-22 DIAGNOSIS — Z8673 Personal history of transient ischemic attack (TIA), and cerebral infarction without residual deficits: Secondary | ICD-10-CM | POA: Diagnosis not present

## 2017-09-22 DIAGNOSIS — K529 Noninfective gastroenteritis and colitis, unspecified: Secondary | ICD-10-CM | POA: Diagnosis not present

## 2017-09-22 DIAGNOSIS — R531 Weakness: Secondary | ICD-10-CM | POA: Diagnosis not present

## 2017-09-22 DIAGNOSIS — E119 Type 2 diabetes mellitus without complications: Secondary | ICD-10-CM | POA: Diagnosis not present

## 2017-09-22 DIAGNOSIS — I739 Peripheral vascular disease, unspecified: Secondary | ICD-10-CM | POA: Diagnosis not present

## 2017-09-22 DIAGNOSIS — E860 Dehydration: Secondary | ICD-10-CM | POA: Diagnosis not present

## 2017-09-26 DIAGNOSIS — I10 Essential (primary) hypertension: Secondary | ICD-10-CM | POA: Diagnosis not present

## 2017-09-26 DIAGNOSIS — E119 Type 2 diabetes mellitus without complications: Secondary | ICD-10-CM | POA: Diagnosis not present

## 2017-09-26 DIAGNOSIS — I639 Cerebral infarction, unspecified: Secondary | ICD-10-CM | POA: Diagnosis not present

## 2017-09-28 DIAGNOSIS — Z6832 Body mass index (BMI) 32.0-32.9, adult: Secondary | ICD-10-CM | POA: Diagnosis not present

## 2017-09-28 DIAGNOSIS — Z713 Dietary counseling and surveillance: Secondary | ICD-10-CM | POA: Diagnosis not present

## 2017-09-28 DIAGNOSIS — Z299 Encounter for prophylactic measures, unspecified: Secondary | ICD-10-CM | POA: Diagnosis not present

## 2017-09-28 DIAGNOSIS — E1165 Type 2 diabetes mellitus with hyperglycemia: Secondary | ICD-10-CM | POA: Diagnosis not present

## 2017-09-28 DIAGNOSIS — I10 Essential (primary) hypertension: Secondary | ICD-10-CM | POA: Diagnosis not present

## 2017-09-29 DIAGNOSIS — I872 Venous insufficiency (chronic) (peripheral): Secondary | ICD-10-CM | POA: Diagnosis not present

## 2017-09-29 DIAGNOSIS — L97229 Non-pressure chronic ulcer of left calf with unspecified severity: Secondary | ICD-10-CM | POA: Diagnosis not present

## 2017-09-29 DIAGNOSIS — L97219 Non-pressure chronic ulcer of right calf with unspecified severity: Secondary | ICD-10-CM | POA: Diagnosis not present

## 2017-09-29 DIAGNOSIS — S80812D Abrasion, left lower leg, subsequent encounter: Secondary | ICD-10-CM | POA: Diagnosis not present

## 2017-09-29 DIAGNOSIS — S80811D Abrasion, right lower leg, subsequent encounter: Secondary | ICD-10-CM | POA: Diagnosis not present

## 2017-09-29 DIAGNOSIS — L97818 Non-pressure chronic ulcer of other part of right lower leg with other specified severity: Secondary | ICD-10-CM | POA: Diagnosis not present

## 2017-09-29 DIAGNOSIS — L97828 Non-pressure chronic ulcer of other part of left lower leg with other specified severity: Secondary | ICD-10-CM | POA: Diagnosis not present

## 2017-10-07 DIAGNOSIS — L97818 Non-pressure chronic ulcer of other part of right lower leg with other specified severity: Secondary | ICD-10-CM | POA: Diagnosis not present

## 2017-10-07 DIAGNOSIS — S80812D Abrasion, left lower leg, subsequent encounter: Secondary | ICD-10-CM | POA: Diagnosis not present

## 2017-10-07 DIAGNOSIS — S80811D Abrasion, right lower leg, subsequent encounter: Secondary | ICD-10-CM | POA: Diagnosis not present

## 2017-10-07 DIAGNOSIS — I872 Venous insufficiency (chronic) (peripheral): Secondary | ICD-10-CM | POA: Diagnosis not present

## 2017-10-07 DIAGNOSIS — L97828 Non-pressure chronic ulcer of other part of left lower leg with other specified severity: Secondary | ICD-10-CM | POA: Diagnosis not present

## 2017-10-13 DIAGNOSIS — I872 Venous insufficiency (chronic) (peripheral): Secondary | ICD-10-CM | POA: Diagnosis not present

## 2017-10-13 DIAGNOSIS — S80812D Abrasion, left lower leg, subsequent encounter: Secondary | ICD-10-CM | POA: Diagnosis not present

## 2017-10-19 ENCOUNTER — Ambulatory Visit: Payer: Medicare Other | Admitting: Neurology

## 2017-10-27 DIAGNOSIS — S80812D Abrasion, left lower leg, subsequent encounter: Secondary | ICD-10-CM | POA: Diagnosis not present

## 2017-10-27 DIAGNOSIS — I872 Venous insufficiency (chronic) (peripheral): Secondary | ICD-10-CM | POA: Diagnosis not present

## 2017-11-01 DIAGNOSIS — Z6832 Body mass index (BMI) 32.0-32.9, adult: Secondary | ICD-10-CM | POA: Diagnosis not present

## 2017-11-01 DIAGNOSIS — Z299 Encounter for prophylactic measures, unspecified: Secondary | ICD-10-CM | POA: Diagnosis not present

## 2017-11-01 DIAGNOSIS — Z23 Encounter for immunization: Secondary | ICD-10-CM | POA: Diagnosis not present

## 2017-11-01 DIAGNOSIS — I10 Essential (primary) hypertension: Secondary | ICD-10-CM | POA: Diagnosis not present

## 2017-11-01 DIAGNOSIS — J069 Acute upper respiratory infection, unspecified: Secondary | ICD-10-CM | POA: Diagnosis not present

## 2017-11-07 ENCOUNTER — Encounter: Payer: Self-pay | Admitting: Neurology

## 2017-11-07 ENCOUNTER — Ambulatory Visit (INDEPENDENT_AMBULATORY_CARE_PROVIDER_SITE_OTHER): Payer: Medicare Other | Admitting: Neurology

## 2017-11-07 ENCOUNTER — Other Ambulatory Visit: Payer: Self-pay

## 2017-11-07 ENCOUNTER — Encounter

## 2017-11-07 VITALS — BP 110/68 | HR 107 | Ht 68.0 in | Wt 217.0 lb

## 2017-11-07 DIAGNOSIS — F0150 Vascular dementia without behavioral disturbance: Secondary | ICD-10-CM

## 2017-11-07 DIAGNOSIS — R400 Somnolence: Secondary | ICD-10-CM | POA: Diagnosis not present

## 2017-11-07 DIAGNOSIS — Z8673 Personal history of transient ischemic attack (TIA), and cerebral infarction without residual deficits: Secondary | ICD-10-CM

## 2017-11-07 DIAGNOSIS — R404 Transient alteration of awareness: Secondary | ICD-10-CM | POA: Diagnosis not present

## 2017-11-07 NOTE — Patient Instructions (Addendum)
1. Schedule 1-hour EEG 2. Schedule in-lab sleep study 3. Schedule MRI brain with and without contrast  We have sent a referral to [facility] for your MRI and they will call you directly to schedule your appt. They are located at [address] [facility]. If you need to contact them directly please call [phone].   4. Follow-up in 6 months, call for any changes

## 2017-11-07 NOTE — Progress Notes (Signed)
NEUROLOGY CONSULTATION NOTE  Matthew Mccormick MRN: [id number] DOB: August 07, 1955  Referring provider: Dr. Kirstie Peri Primary care provider: Dr. Kirstie Peri  Reason for consult:  Seizure-like activity  Dear Dr Sherryll Burger:  Thank you for your kind referral of Matthew Mccormick for consultation of the above symptoms. Although his history is well known to you, please allow me to reiterate it for the purpose of our medical record. The patient was accompanied to the clinic by his cousin who also provides collateral information. Records and images were personally reviewed where available.  HISTORY OF PRESENT ILLNESS: This is a pleasant 62 year old right-handed man with a history of hypertension, hyperlipidemia, diabetes, right PCA stroke in 2014 with residual cognitive deficits and mild left-sided weakness, presenting for evaluation of recurrent episodes of confusion. Symptoms started around 2 years ago. He lives alone and has an aide coming from 8am to 4pm. There have been several times that the aide comes and he would be standing in the middle of the room, confused, with difficulty answering questions. He has been brought to the ER in [facility] several times for these but would be back to baseline upon arrival. His cousin has witnessed one episode, he was totally disoriented and could not say her name or remember where everything was. She went to get a drink and came back to him back to baseline, knowing who she was. Episodes appear to occur in the morning when his aide arrives. She then stays with him for the day and has not witnessed similar episodes when they are together. His cousin thinks they last from 30-60 minutes. There was one time he was taken to the hospital 2 years ago and got extremely angry, seeing things on the wall. This past year, he has had 3 episodes, which is a little more frequent than before. Last episode was around April or May 2019. He has not mentioned any other symptoms, he is  unaware of them, and states the last thing he remembers is sitting on a chair. He denies any olfactory/gustatory hallucinations, nausea, myoclonic jerks. He has baseline mild left-sided weakness from the stroke. He was reporting right arm numbness and had an EMG a year ago showing very severe right ulnar neuropathy and moderate right median neuropathy. He denies any right hand weakness. He has occasional tingling in both hands. He denies any falls. His aide or cousin have not witnessed any staring/unresponsive episodes. His aide checked his glucose level with these episodes which would be normal, she has not checked his BP.   His cousin has been helping him since the stroke in 2014. She manages his bills and pillbox every week. He was getting confused a couple of months ago with his medicine cups, they have changed things so he gets less confused. He denies any headaches, dizziness, diplopia, dysarthria/dysphagia, neck/back pain, bowel/bladder dysfunction. His cousin endorses daytime drowsiness, he was standing and fell asleep while at his doctor's office. He reports sleep at night is good, however he is alone at night. He is slow to respond to questions with poor eye contact, but is interactive and laughs with his cousin. She states he is always like this at doctor's offices.   Diagnostic Data: MRI brain report from 2014 showed a subacute hemorrhagic infarct in the right PCA distribution affecting the right occipital lobe, right posterior medial temporal lobe, right posterior thalamus. He had a 30-day holter at that time with no atrial fibrillation. There is a CTA head in 02/2016  reporting sequela of remote right PCA infarct, no new occlusion, chronically absent right A1 segment.    PAST MEDICAL HISTORY: Past Medical History:  Diagnosis Date  . 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 Chronic kidney disease   . Diabetes mellitus without complication (HCC)   . Hyperlipidemia   .  Hypertension   . Slow rate of speech   . Stroke [facility])    1 year ago-left and shaky and unsteady on feet.    PAST SURGICAL HISTORY: Past Surgical History:  Procedure Laterality Date  . CATARACT EXTRACTION W/PHACO Right 06/18/2013   Procedure: CATARACT EXTRACTION PHACO AND INTRAOCULAR LENS PLACEMENT (IOC);  Surgeon: Gemma Payor, MD;  Location: [facility];  Service: Ophthalmology;  Laterality: Right;  CDE 34.55  . ORIF ANKLE DISLOCATION Right     MEDICATIONS: Current Outpatient Medications on File Prior to Visit  Medication Sig Dispense Refill  . amLODipine (NORVASC) 10 MG tablet Take 10 mg by mouth daily.    Marland Kitchen aspirin EC 81 MG tablet Take 81 mg by mouth daily.    Marland Kitchen atenolol (TENORMIN) 100 MG tablet Take 100 mg by mouth daily.    Marland Kitchen atorvastatin (LIPITOR) 40 MG tablet Take 40 mg by mouth daily.    . carvedilol (COREG) 3.125 MG tablet Take by mouth.    . clopidogrel (PLAVIX) 75 MG tablet Take by mouth.    . doxycycline (VIBRAMYCIN) 100 MG capsule Take 1 capsule (100 mg total) by mouth 2 (two) times daily. (Patient not taking: Reported on 09/06/2016) 60 capsule 1  . empagliflozin (JARDIANCE) 10 MG TABS tablet Take by mouth.    . insulin detemir (LEVEMIR) 100 UNIT/ML injection Inject 40 Units into the skin daily.    Marland Kitchen lisinopril (PRINIVIL,ZESTRIL) 10 MG tablet Take 10 mg by mouth daily.    . metFORMIN (GLUCOPHAGE) 1000 MG tablet Take 1,000 mg by mouth 2 (two) times daily with a meal.    . pioglitazone (ACTOS) 30 MG tablet Take 30 mg by mouth daily.    . Terbinafine HCl (LAMISIL PO) Take by mouth.     No current facility-administered medications on file prior to visit.     ALLERGIES: No Known Allergies  FAMILY HISTORY: No family history on file.  SOCIAL HISTORY: Social History   Socioeconomic History  . Marital status: Single    Spouse name: Not on file  . Number of children: Not on file  . Years of education: Not on file  . Highest education level: Not on file  Occupational History   . Not on file  Social Needs  . Financial resource strain: Not on file  . Food insecurity:    Worry: Not on file    Inability: Not on file  . Transportation needs:    Medical: Not on file    Non-medical: Not on file  Tobacco Use  . Smoking status: Former Smoker    Packs/day: 1.00    Years: 1.00    Pack years: 1.00    Types: Cigarettes    Last attempt to quit: 06/16/1991    Years since quitting: 26.4  . Smokeless tobacco: Never Used  Substance and Sexual Activity  . Alcohol use: No  . Drug use: No  . Sexual activity: Yes    Birth control/protection: None  Lifestyle  . Physical activity:    Days per week: Not on file    Minutes per session: Not on file  . Stress: Not on file  Relationships  .  Social connections:    Talks on phone: Not on file    Gets together: Not on file    Attends religious service: Not on file    Active member of club or organization: Not on file    Attends meetings of clubs or organizations: Not on file    Relationship status: Not on file  . Intimate partner violence:    Fear of current or ex partner: Not on file    Emotionally abused: Not on file    Physically abused: Not on file    Forced sexual activity: Not on file  Other Topics Concern  . Not on file  Social History Narrative  . Not on file    REVIEW OF SYSTEMS: Constitutional: No fevers, chills, or sweats, no generalized fatigue, change in appetite Eyes: No visual changes, double vision, eye pain Ear, nose and throat: No hearing loss, ear pain, nasal congestion, sore throat Cardiovascular: No chest pain, palpitations Respiratory:  No shortness of breath at rest or with exertion, wheezes GastrointestinaI: No nausea, vomiting, diarrhea, abdominal pain, fecal incontinence Genitourinary:  No dysuria, urinary retention or frequency Musculoskeletal:  No neck pain, back pain Integumentary: No rash, pruritus, skin lesions Neurological: as above Psychiatric: No depression, insomnia,  anxiety Endocrine: No palpitations, fatigue, diaphoresis, mood swings, change in appetite, change in weight, increased thirst Hematologic/Lymphatic:  No anemia, purpura, petechiae. Allergic/Immunologic: no itchy/runny eyes, nasal congestion, recent allergic reactions, rashes  PHYSICAL EXAM: Vitals:   11/07/17 1046  BP: 110/68  Pulse: (!) 107  SpO2: 98%   General: No acute distress, poor eye contact, flat affect but smiles/laughs at cousin's jokes Head:  Normocephalic/atraumatic Eyes: Fundoscopic exam shows bilateral sharp discs, no vessel changes, exudates, or hemorrhages Neck: supple, no paraspinal tenderness, full range of motion Back: No paraspinal tenderness Heart: regular rate and rhythm Lungs: Clear to auscultation bilaterally. Vascular: No carotid bruits. Skin/Extremities: No rash, no edema Neurological Exam: Mental status: alert and oriented to person, place, and time, no dysarthria or aphasia, Fund of knowledge is appropriate.  Recent and remote memory are intact.  Attention and concentration are normal.    Able to name objects and repeat phrases. CDT 0/5 MMSE - Mini Mental State Exam 11/07/2017  Orientation to time 4  Orientation to Place 4  Registration 3  Attention/ Calculation 4  Recall 2  Language- name 2 objects 2  Language- repeat 1  Language- follow 3 step command 3  Language- read & follow direction 1  Write a sentence 1  Copy design 0  Total score 25   Cranial nerves: CN I: not tested CN II: pupils equal, round and reactive to light, visual fields intact, fundi unremarkable. CN III, IV, VI:  full range of motion, no nystagmus, no ptosis CN V: facial sensation intact CN VII: upper and lower face symmetric CN VIII: hearing intact to finger rub CN IX, X: gag intact, uvula midline CN XI: sternocleidomastoid and trapezius muscles intact CN XII: tongue midline Bulk & Tone: bilateral thenar atrophy, no fasciculations, no cogwheeling Motor: 5/5 throughout  with no pronator drift, minimal orbiting around the left arm Sensation: intact to light touch, cold, pin on both UE, decreased cold on left LE, intact pin, decreased vibration sense to knees bilaterally. Romberg test positive Deep Tendon Reflexes: +1 throughout, no ankle clonus Plantar responses: downgoing bilaterally Cerebellar: no incoordination on finger to nose testing with left endpoint tremor Gait: wide-based with cane, favoring left leg Tremor: no resting tremor, +left postural and action tremor  IMPRESSION: This is a pleasant 62 year old right-handed man with a history of hypertension, hyperlipidemia, diabetes, right PCA stroke in 2014 with residual cognitive deficits and mild left-sided weakness, presenting for evaluation of recurrent episodes of confusion. His neurological exam shows mild left-sided weakness, chronic from stroke. MMSE today 25/30. Etiology of episodes unclear, it appears he has them in the morning hours prior to aide arriving, but has not had them when she attends to him throughout the day. His stroke may act as a seizure focus, MRI brain with and without contrast and 1-hour EEG will be ordered. We discussed that with episodes appearing to occur in the morning and with daytime drowsiness, poorly controlled sleep apnea is a possibility. His cousin does not think he will tolerate CPAP, but agrees to an in-lab sleep study (he lives alone and will not be able to complete a home study). His cousin was instructed to keep a calendar of his symptoms. He does not drive. Follow-up after 6 months, she knows to call for any changes.   Thank you for allowing me to participate in the care of this patient. Please do not hesitate to call for any questions or concerns.   Patrcia Dolly, M.D.  CC: Dr. Sherryll Burger

## 2017-11-14 ENCOUNTER — Ambulatory Visit (INDEPENDENT_AMBULATORY_CARE_PROVIDER_SITE_OTHER): Payer: Medicare Other | Admitting: Neurology

## 2017-11-14 DIAGNOSIS — M79674 Pain in right toe(s): Secondary | ICD-10-CM | POA: Diagnosis not present

## 2017-11-14 DIAGNOSIS — F0150 Vascular dementia without behavioral disturbance: Secondary | ICD-10-CM

## 2017-11-14 DIAGNOSIS — R404 Transient alteration of awareness: Secondary | ICD-10-CM | POA: Diagnosis not present

## 2017-11-14 DIAGNOSIS — Z8673 Personal history of transient ischemic attack (TIA), and cerebral infarction without residual deficits: Secondary | ICD-10-CM

## 2017-11-14 DIAGNOSIS — B351 Tinea unguium: Secondary | ICD-10-CM | POA: Diagnosis not present

## 2017-11-14 DIAGNOSIS — M79675 Pain in left toe(s): Secondary | ICD-10-CM | POA: Diagnosis not present

## 2017-11-14 DIAGNOSIS — R400 Somnolence: Secondary | ICD-10-CM

## 2017-11-14 DIAGNOSIS — E1151 Type 2 diabetes mellitus with diabetic peripheral angiopathy without gangrene: Secondary | ICD-10-CM | POA: Diagnosis not present

## 2017-11-15 DIAGNOSIS — E119 Type 2 diabetes mellitus without complications: Secondary | ICD-10-CM | POA: Diagnosis not present

## 2017-11-15 DIAGNOSIS — I10 Essential (primary) hypertension: Secondary | ICD-10-CM | POA: Diagnosis not present

## 2017-11-15 DIAGNOSIS — I639 Cerebral infarction, unspecified: Secondary | ICD-10-CM | POA: Diagnosis not present

## 2017-11-16 NOTE — Procedures (Signed)
ELECTROENCEPHALOGRAM REPORT  Date of Study: 11/14/2017  Patient's Name: Matthew Mccormick MRN: [id number] Date of Birth: [birth year]  Referring Provider: Dr. Patrcia Dolly  Clinical History: This is a 62 year old man with vascular dementia, with episodes of confusion.  Medications: NORVASC 10 MG tablet   aspirin EC 81 MG tablet  TENORMIN 100 MG tablet  LIPITOR 40 MG tablet  COREG 3.125 MG tablet  PLAVIX 75 MG tablet  JARDIANCE 10 MG TABS tablet  LEVEMIR 100 UNIT/ML injection  PRINIVIL,ZESTRIL 10 MG tablet  GLUCOPHAGE 1000 MG tablet  ACTOS 30 MG tablet LAMISIL PO  Technical Summary: A multichannel digital 1-hour EEG recording measured by the international 10-20 system with electrodes applied with paste and impedances below 5000 ohms performed as portable with EKG monitoring in an awake and asleep patient.  Hyperventilation and photic stimulation were not performed.  The digital EEG was referentially recorded, reformatted, and digitally filtered in a variety of bipolar and referential montages for optimal display.   Description: The patient is awake and asleep during the recording.  During maximal wakefulness, there is a symmetric, medium voltage 7-7.5 Hz posterior dominant rhythm that attenuates with eye opening. The record is symmetric. During drowsiness and sleep, there is an increase in theta slowing of the background.  Vertex waves and symmetric sleep spindles were seen.  Hyperventilation and photic stimulation were not performed.  There were no epileptiform discharges or electrographic seizures seen.    EKG lead showed occasional extrasystolic beats.  Impression: This 1-hour awake and asleep EEG is abnormal due to slowing of the posterior dominant rhythm.   Clinical Correlation of the above findings indicates diffuse cerebral dysfunction that is non-specific in etiology and can be seen with hypoxic/ischemic injury, toxic/metabolic encephalopathies, neurodegenerative disorders, or  medication effect.  The absence of epileptiform discharges does not rule out a clinical diagnosis of epilepsy.  Clinical correlation is advised.   Patrcia Dolly, M.D.

## 2017-11-18 ENCOUNTER — Telehealth: Payer: Self-pay

## 2017-11-18 NOTE — Telephone Encounter (Signed)
Spoke with pt's cousin, [name], relaying message below.  She states that MRI is scheduled for tomorrow. States that sleep study will most likely be a waste of time. Pt fights just taking his stocking off and cousin states that he will refuse to wear a CPAP.

## 2017-11-18 NOTE — Telephone Encounter (Signed)
-----   Message from Van Clines, MD sent at 11/17/2017 11:14 AM EDT ----- Pls let his cousin know the brain wave test looked fine, no seizure discharges seen. Proceed with MRI brain and sleep study as discussed. Thanks

## 2017-11-19 ENCOUNTER — Ambulatory Visit
Admission: RE | Admit: 2017-11-19 | Discharge: 2017-11-19 | Disposition: A | Discharge status: Home / Self Care | Payer: Medicare Other | Source: Ambulatory Visit | Attending: Neurology | Admitting: Neurology

## 2017-11-19 DIAGNOSIS — R404 Transient alteration of awareness: Secondary | ICD-10-CM

## 2017-11-19 DIAGNOSIS — Z8673 Personal history of transient ischemic attack (TIA), and cerebral infarction without residual deficits: Secondary | ICD-10-CM

## 2017-11-19 DIAGNOSIS — I619 Nontraumatic intracerebral hemorrhage, unspecified: Secondary | ICD-10-CM | POA: Diagnosis not present

## 2017-11-19 DIAGNOSIS — F0150 Vascular dementia without behavioral disturbance: Secondary | ICD-10-CM

## 2017-11-19 DIAGNOSIS — R400 Somnolence: Secondary | ICD-10-CM

## 2017-11-19 MED ORDER — GADOBENATE DIMEGLUMINE 529 MG/ML IV SOLN
20.0000 mL | Freq: Once | INTRAVENOUS | Status: AC | PRN
  Administered 2017-11-19: 20 mL via INTRAVENOUS

## 2017-11-24 ENCOUNTER — Telehealth: Payer: Self-pay

## 2017-11-24 NOTE — Telephone Encounter (Signed)
-----   Message from Vivien Rota, LPN sent at [id number]/63/8756 11:34 AM EDT ----- Please call.   ----- Message ----- From: Van Clines, MD Sent: 11/21/2017   9:42 AM EDT To: Vonzell Schlatter Driver, LPN  Pls let patient's cousin know that the MRI brain did not show any evidence of new strokes, it shows the old strokes similar to his prior brain scans. At this point since it is difficult to do the sleep study and potentially wear CPAP, continue to monitor symptoms, if he starts having the confusion suddenly during the daytime while the aide is with him (and not just after he wakes up), then we may start a seizure and medication and see if this helps. But at this point, no clear evidence of seizures. Thanks

## 2017-11-24 NOTE — Telephone Encounter (Signed)
Spoke with pt's cousin, [name], relaying message below.  She verbalized understanding.

## 2017-11-26 IMAGING — MR MR FOOT*L* W/O CM
4 of 5 series · 13 of 40 positions shown · non-contrast
Comparison: None.

CLINICAL DATA: Nonhealing ulcer on the foot. Ulcer involves the
first left toe. On going for 7 months.

EXAM:
MRI OF THE LEFT FOOT WITHOUT CONTRAST
TECHNIQUE: Multiplanar, multisequence MR imaging of the left forefoot was
performed. No intravenous contrast was administered.

[Series 5: T1 · coronal · 3.0mm · 0.17mm/px · 4 of 30 slices shown (1 of 2)]
[im 1/30]
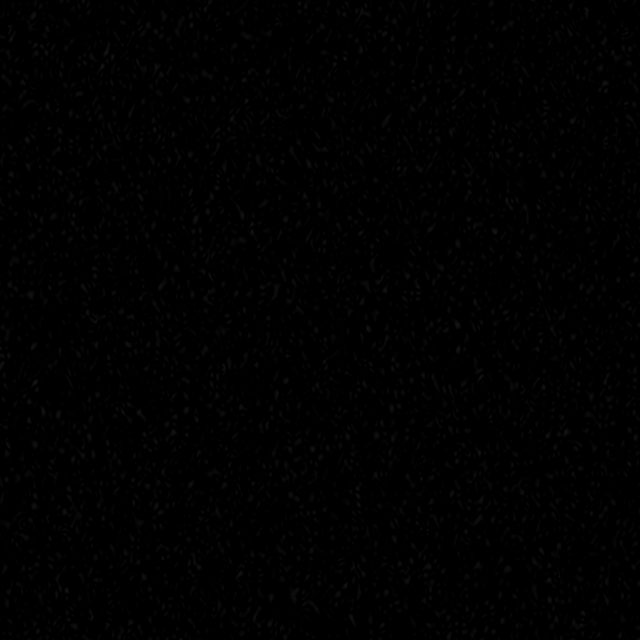
[im 5/30]
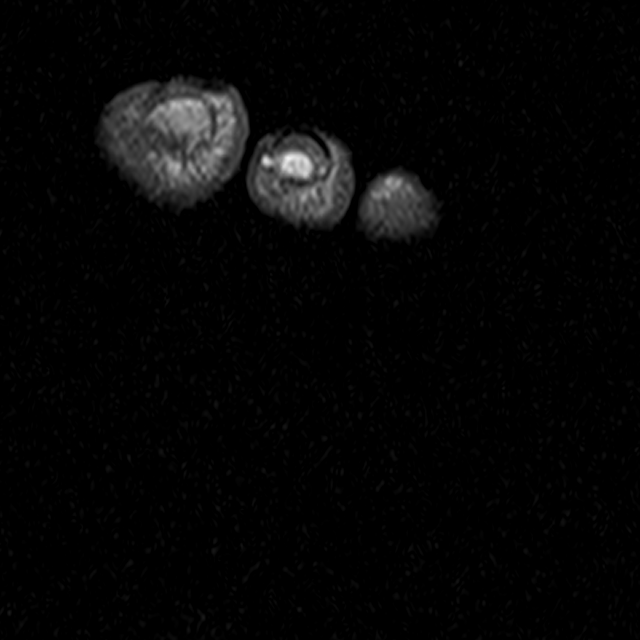
[im 17/30]
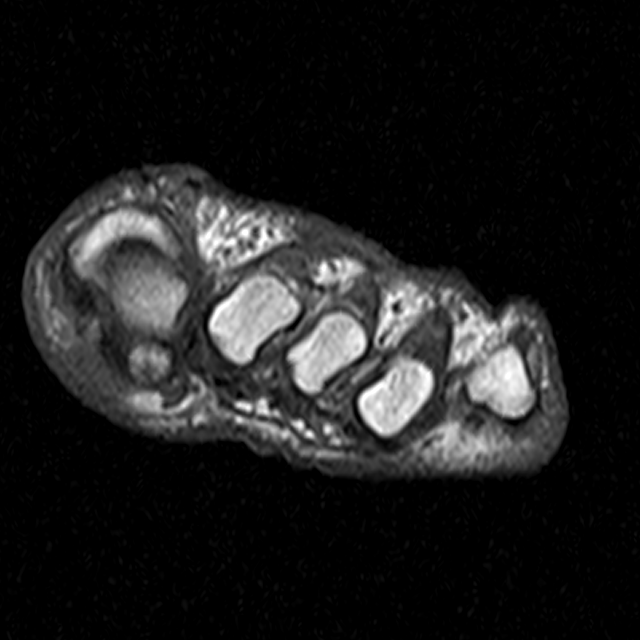
[im 25/30]
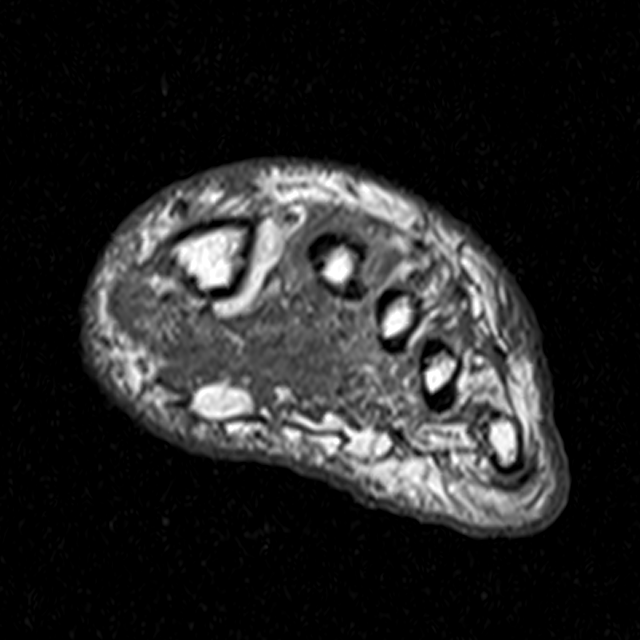

[Series 6: t2fs axial · coronal · 3.0mm · 0.17mm/px · 3 of 30 slices shown]
[im 5/30]
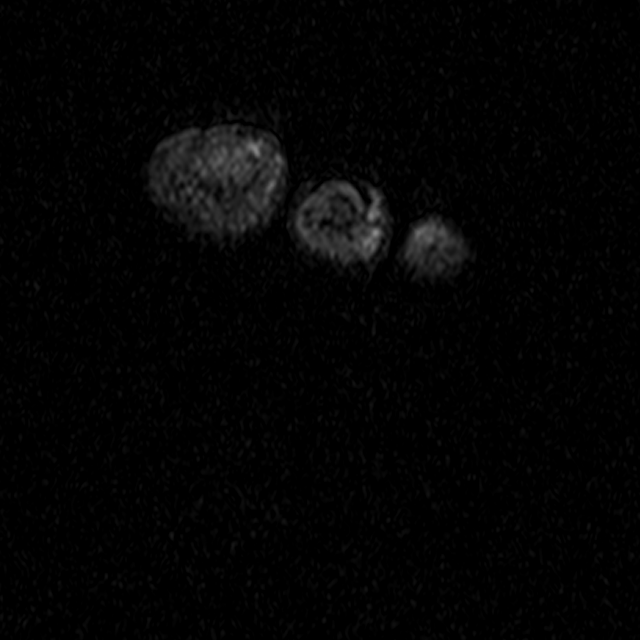
[im 17/30]
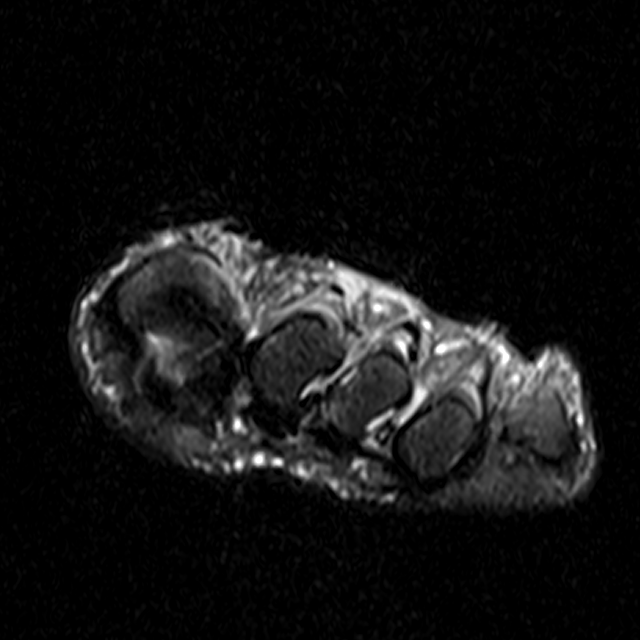
[im 25/30]
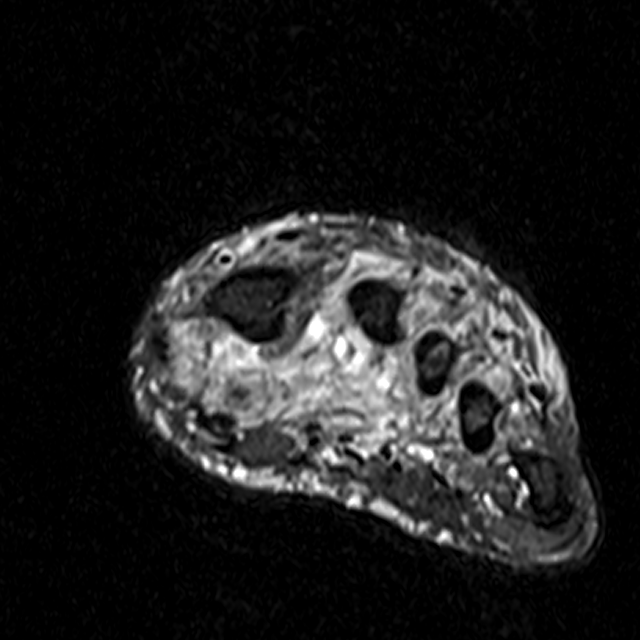

[Series 7: T1 · oblique · 3.0mm · 0.25mm/px · 3 of 28 slices shown (2 of 2)]
[im 4/28]
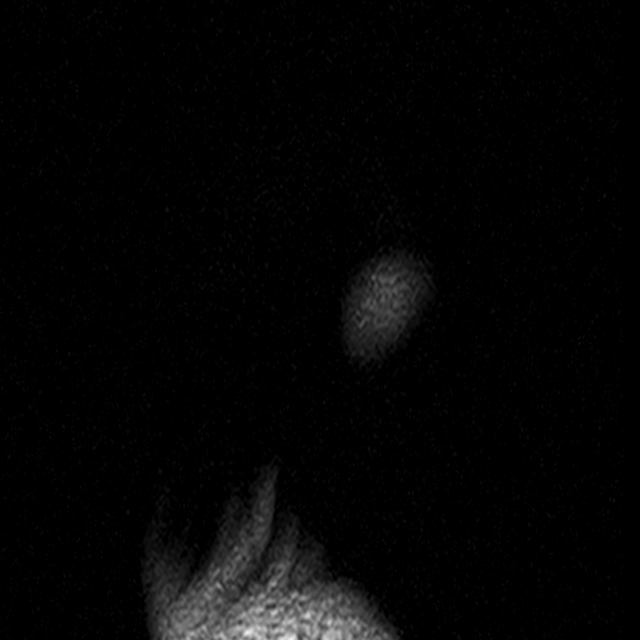
[im 16/28]
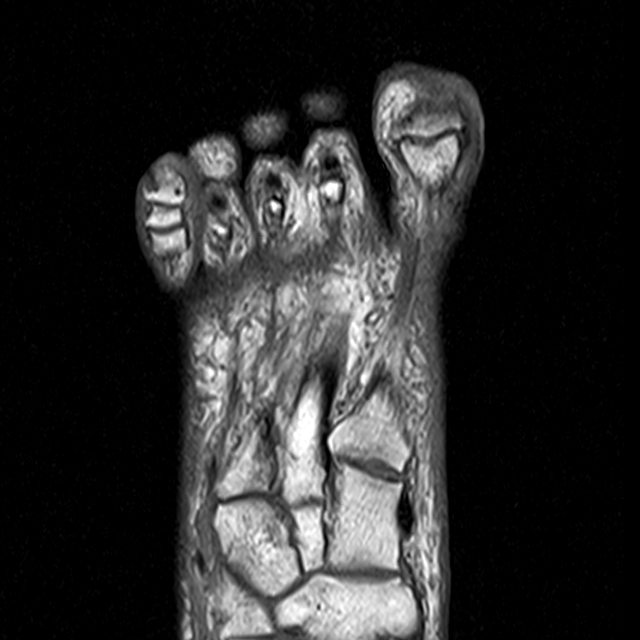
[im 24/28]
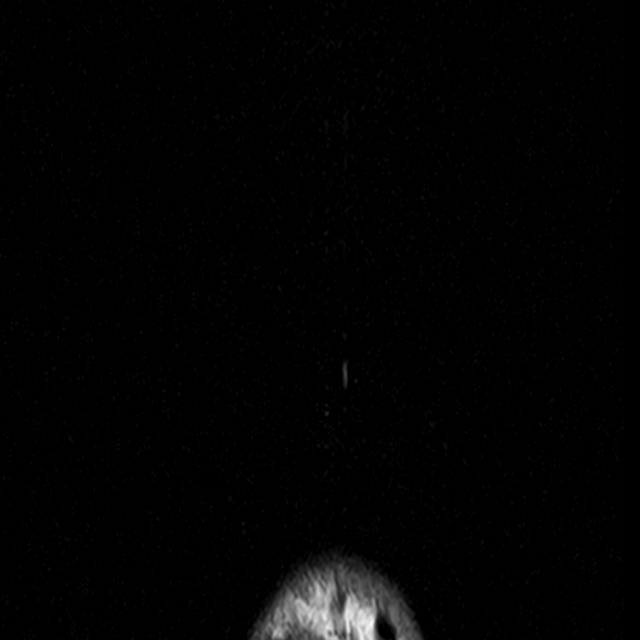

[Series 8: ir coronal · oblique · 3.0mm · 0.25mm/px · 3 of 28 slices shown]
[im 4/28]
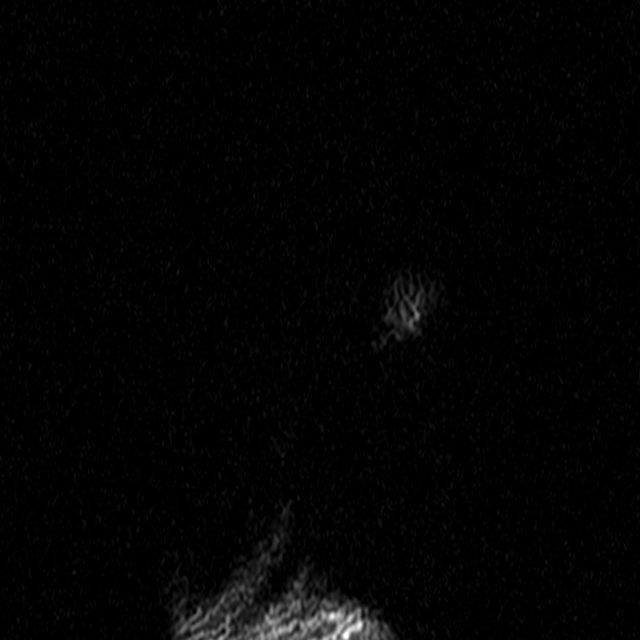
[im 16/28]
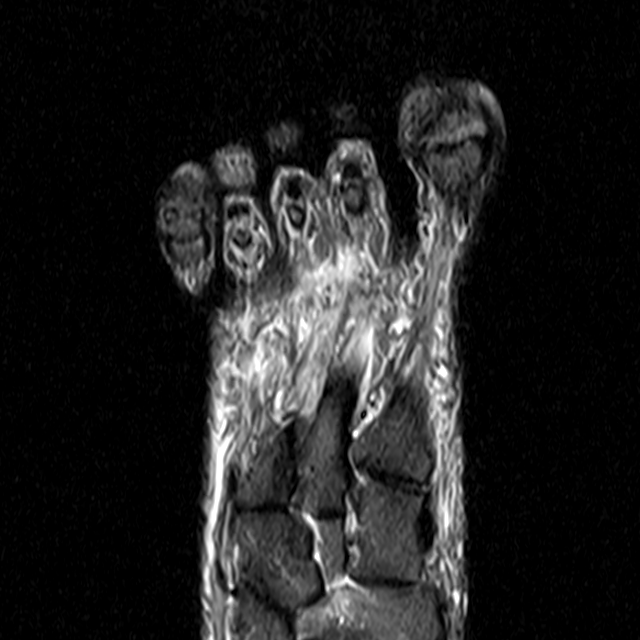
[im 24/28]
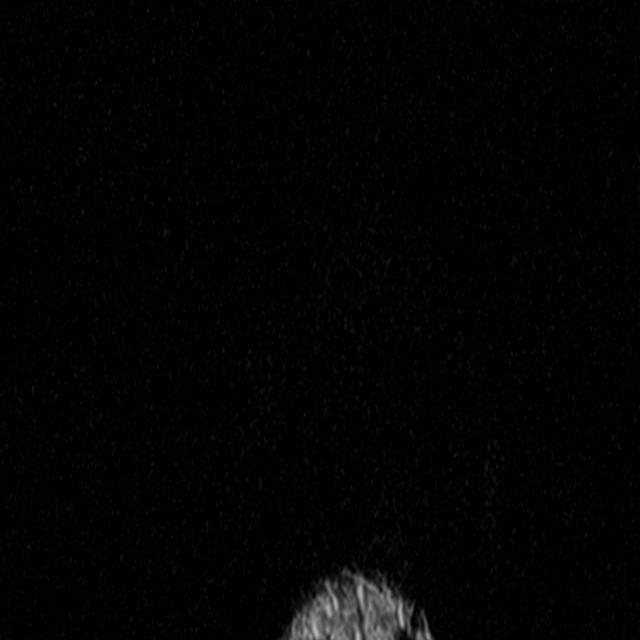

[13 of 40 positions shown; findings below may reference images not displayed]

FINDINGS: Bones/Joint/Cartilage

Marrow edema in the first distal phalanx with cortical regularity
along the distal dorsal aspect concerning for osteomyelitis.

Healing fracture of the left fifth metatarsal neck with mild
persistent marrow edema.

No other marrow signal abnormality. No other fracture or
dislocation. Normal alignment. No joint effusion. No periosteal
reaction. No aggressive osseous lesion.

Ligaments

Collateral ligaments are intact.  Lisfranc ligament is intact.

Muscles and Tendons
Flexor, peroneal and extensor compartment tendons are intact. T2
hyperintense signal throughout the musculature likely neurogenic.

Soft tissue
No fluid collection or hematoma.  No soft tissue mass.
IMPRESSION: 1. Marrow edema in the first distal phalanx with cortical regularity
along the distal dorsal aspect concerning for osteomyelitis.
2. Healing fracture of the left fifth metatarsal neck with mild
persistent marrow edema.

## 2017-12-14 DIAGNOSIS — E119 Type 2 diabetes mellitus without complications: Secondary | ICD-10-CM | POA: Diagnosis not present

## 2017-12-14 DIAGNOSIS — I10 Essential (primary) hypertension: Secondary | ICD-10-CM | POA: Diagnosis not present

## 2017-12-14 DIAGNOSIS — I639 Cerebral infarction, unspecified: Secondary | ICD-10-CM | POA: Diagnosis not present

## 2017-12-21 DIAGNOSIS — E113412 Type 2 diabetes mellitus with severe nonproliferative diabetic retinopathy with macular edema, left eye: Secondary | ICD-10-CM | POA: Diagnosis not present

## 2017-12-21 DIAGNOSIS — E113491 Type 2 diabetes mellitus with severe nonproliferative diabetic retinopathy without macular edema, right eye: Secondary | ICD-10-CM | POA: Diagnosis not present

## 2017-12-21 DIAGNOSIS — H35373 Puckering of macula, bilateral: Secondary | ICD-10-CM | POA: Diagnosis not present

## 2017-12-21 DIAGNOSIS — H3582 Retinal ischemia: Secondary | ICD-10-CM | POA: Diagnosis not present

## 2017-12-23 DIAGNOSIS — E860 Dehydration: Secondary | ICD-10-CM | POA: Diagnosis not present

## 2017-12-23 DIAGNOSIS — R456 Violent behavior: Secondary | ICD-10-CM | POA: Diagnosis not present

## 2017-12-23 DIAGNOSIS — R410 Disorientation, unspecified: Secondary | ICD-10-CM | POA: Diagnosis not present

## 2017-12-23 DIAGNOSIS — I10 Essential (primary) hypertension: Secondary | ICD-10-CM | POA: Diagnosis not present

## 2017-12-23 DIAGNOSIS — Z8249 Family history of ischemic heart disease and other diseases of the circulatory system: Secondary | ICD-10-CM | POA: Diagnosis not present

## 2017-12-23 DIAGNOSIS — Z833 Family history of diabetes mellitus: Secondary | ICD-10-CM | POA: Diagnosis not present

## 2017-12-23 DIAGNOSIS — Z8673 Personal history of transient ischemic attack (TIA), and cerebral infarction without residual deficits: Secondary | ICD-10-CM | POA: Diagnosis not present

## 2017-12-23 DIAGNOSIS — Z79899 Other long term (current) drug therapy: Secondary | ICD-10-CM | POA: Diagnosis not present

## 2017-12-23 DIAGNOSIS — R4182 Altered mental status, unspecified: Secondary | ICD-10-CM | POA: Diagnosis not present

## 2017-12-23 DIAGNOSIS — R404 Transient alteration of awareness: Secondary | ICD-10-CM | POA: Diagnosis not present

## 2017-12-23 DIAGNOSIS — R0902 Hypoxemia: Secondary | ICD-10-CM | POA: Diagnosis not present

## 2017-12-23 DIAGNOSIS — Z794 Long term (current) use of insulin: Secondary | ICD-10-CM | POA: Diagnosis not present

## 2017-12-23 DIAGNOSIS — E119 Type 2 diabetes mellitus without complications: Secondary | ICD-10-CM | POA: Diagnosis not present

## 2017-12-23 DIAGNOSIS — Z7902 Long term (current) use of antithrombotics/antiplatelets: Secondary | ICD-10-CM | POA: Diagnosis not present

## 2018-01-03 DIAGNOSIS — I10 Essential (primary) hypertension: Secondary | ICD-10-CM | POA: Diagnosis not present

## 2018-01-03 DIAGNOSIS — Z299 Encounter for prophylactic measures, unspecified: Secondary | ICD-10-CM | POA: Diagnosis not present

## 2018-01-03 DIAGNOSIS — I739 Peripheral vascular disease, unspecified: Secondary | ICD-10-CM | POA: Diagnosis not present

## 2018-01-03 DIAGNOSIS — Z6832 Body mass index (BMI) 32.0-32.9, adult: Secondary | ICD-10-CM | POA: Diagnosis not present

## 2018-01-03 DIAGNOSIS — E1165 Type 2 diabetes mellitus with hyperglycemia: Secondary | ICD-10-CM | POA: Diagnosis not present

## 2018-02-02 DIAGNOSIS — E119 Type 2 diabetes mellitus without complications: Secondary | ICD-10-CM | POA: Diagnosis not present

## 2018-02-02 DIAGNOSIS — I639 Cerebral infarction, unspecified: Secondary | ICD-10-CM | POA: Diagnosis not present

## 2018-02-02 DIAGNOSIS — I10 Essential (primary) hypertension: Secondary | ICD-10-CM | POA: Diagnosis not present

## 2018-02-16 DIAGNOSIS — E119 Type 2 diabetes mellitus without complications: Secondary | ICD-10-CM | POA: Diagnosis not present

## 2018-02-16 DIAGNOSIS — Z961 Presence of intraocular lens: Secondary | ICD-10-CM | POA: Diagnosis not present

## 2018-02-16 DIAGNOSIS — H40013 Open angle with borderline findings, low risk, bilateral: Secondary | ICD-10-CM | POA: Diagnosis not present

## 2018-02-16 DIAGNOSIS — H18413 Arcus senilis, bilateral: Secondary | ICD-10-CM | POA: Diagnosis not present

## 2018-03-02 DIAGNOSIS — E119 Type 2 diabetes mellitus without complications: Secondary | ICD-10-CM | POA: Diagnosis not present

## 2018-03-02 DIAGNOSIS — I639 Cerebral infarction, unspecified: Secondary | ICD-10-CM | POA: Diagnosis not present

## 2018-03-02 DIAGNOSIS — I10 Essential (primary) hypertension: Secondary | ICD-10-CM | POA: Diagnosis not present

## 2018-03-13 DIAGNOSIS — B351 Tinea unguium: Secondary | ICD-10-CM | POA: Diagnosis not present

## 2018-03-13 DIAGNOSIS — M79674 Pain in right toe(s): Secondary | ICD-10-CM | POA: Diagnosis not present

## 2018-03-13 DIAGNOSIS — E1151 Type 2 diabetes mellitus with diabetic peripheral angiopathy without gangrene: Secondary | ICD-10-CM | POA: Diagnosis not present

## 2018-03-13 DIAGNOSIS — M79675 Pain in left toe(s): Secondary | ICD-10-CM | POA: Diagnosis not present

## 2018-03-30 DIAGNOSIS — I10 Essential (primary) hypertension: Secondary | ICD-10-CM | POA: Diagnosis not present

## 2018-03-30 DIAGNOSIS — E119 Type 2 diabetes mellitus without complications: Secondary | ICD-10-CM | POA: Diagnosis not present

## 2018-03-30 DIAGNOSIS — I639 Cerebral infarction, unspecified: Secondary | ICD-10-CM | POA: Diagnosis not present

## 2018-04-10 DIAGNOSIS — I10 Essential (primary) hypertension: Secondary | ICD-10-CM | POA: Diagnosis not present

## 2018-04-10 DIAGNOSIS — Z299 Encounter for prophylactic measures, unspecified: Secondary | ICD-10-CM | POA: Diagnosis not present

## 2018-04-10 DIAGNOSIS — Z789 Other specified health status: Secondary | ICD-10-CM | POA: Diagnosis not present

## 2018-04-10 DIAGNOSIS — I739 Peripheral vascular disease, unspecified: Secondary | ICD-10-CM | POA: Diagnosis not present

## 2018-04-10 DIAGNOSIS — E1165 Type 2 diabetes mellitus with hyperglycemia: Secondary | ICD-10-CM | POA: Diagnosis not present

## 2018-04-10 DIAGNOSIS — Z6830 Body mass index (BMI) 30.0-30.9, adult: Secondary | ICD-10-CM | POA: Diagnosis not present

## 2018-05-11 ENCOUNTER — Ambulatory Visit: Payer: Medicare Other | Admitting: Neurology

## 2018-05-11 DIAGNOSIS — I639 Cerebral infarction, unspecified: Secondary | ICD-10-CM | POA: Diagnosis not present

## 2018-05-11 DIAGNOSIS — E119 Type 2 diabetes mellitus without complications: Secondary | ICD-10-CM | POA: Diagnosis not present

## 2018-05-11 DIAGNOSIS — I10 Essential (primary) hypertension: Secondary | ICD-10-CM | POA: Diagnosis not present

## 2018-05-16 ENCOUNTER — Encounter: Payer: Self-pay | Admitting: Neurology

## 2018-05-23 ENCOUNTER — Ambulatory Visit: Payer: Medicare Other | Admitting: Neurology

## 2018-06-21 DIAGNOSIS — E119 Type 2 diabetes mellitus without complications: Secondary | ICD-10-CM | POA: Diagnosis not present

## 2018-06-21 DIAGNOSIS — I639 Cerebral infarction, unspecified: Secondary | ICD-10-CM | POA: Diagnosis not present

## 2018-06-21 DIAGNOSIS — I10 Essential (primary) hypertension: Secondary | ICD-10-CM | POA: Diagnosis not present

## 2018-07-21 DIAGNOSIS — E119 Type 2 diabetes mellitus without complications: Secondary | ICD-10-CM | POA: Diagnosis not present

## 2018-07-21 DIAGNOSIS — I10 Essential (primary) hypertension: Secondary | ICD-10-CM | POA: Diagnosis not present

## 2018-07-21 DIAGNOSIS — I639 Cerebral infarction, unspecified: Secondary | ICD-10-CM | POA: Diagnosis not present

## 2018-08-01 DIAGNOSIS — H35373 Puckering of macula, bilateral: Secondary | ICD-10-CM | POA: Diagnosis not present

## 2018-08-01 DIAGNOSIS — H43813 Vitreous degeneration, bilateral: Secondary | ICD-10-CM | POA: Diagnosis not present

## 2018-08-01 DIAGNOSIS — E113493 Type 2 diabetes mellitus with severe nonproliferative diabetic retinopathy without macular edema, bilateral: Secondary | ICD-10-CM | POA: Diagnosis not present

## 2018-08-07 DIAGNOSIS — I739 Peripheral vascular disease, unspecified: Secondary | ICD-10-CM | POA: Diagnosis not present

## 2018-08-07 DIAGNOSIS — E1165 Type 2 diabetes mellitus with hyperglycemia: Secondary | ICD-10-CM | POA: Diagnosis not present

## 2018-08-07 DIAGNOSIS — Z6830 Body mass index (BMI) 30.0-30.9, adult: Secondary | ICD-10-CM | POA: Diagnosis not present

## 2018-08-07 DIAGNOSIS — Z299 Encounter for prophylactic measures, unspecified: Secondary | ICD-10-CM | POA: Diagnosis not present

## 2018-08-07 DIAGNOSIS — I10 Essential (primary) hypertension: Secondary | ICD-10-CM | POA: Diagnosis not present

## 2018-08-07 DIAGNOSIS — F419 Anxiety disorder, unspecified: Secondary | ICD-10-CM | POA: Diagnosis not present

## 2018-08-17 DIAGNOSIS — I10 Essential (primary) hypertension: Secondary | ICD-10-CM | POA: Diagnosis not present

## 2018-08-17 DIAGNOSIS — I639 Cerebral infarction, unspecified: Secondary | ICD-10-CM | POA: Diagnosis not present

## 2018-08-17 DIAGNOSIS — E119 Type 2 diabetes mellitus without complications: Secondary | ICD-10-CM | POA: Diagnosis not present

## 2018-08-29 DIAGNOSIS — H02834 Dermatochalasis of left upper eyelid: Secondary | ICD-10-CM | POA: Diagnosis not present

## 2018-08-29 DIAGNOSIS — Z961 Presence of intraocular lens: Secondary | ICD-10-CM | POA: Diagnosis not present

## 2018-08-29 DIAGNOSIS — H40013 Open angle with borderline findings, low risk, bilateral: Secondary | ICD-10-CM | POA: Diagnosis not present

## 2018-08-29 DIAGNOSIS — E119 Type 2 diabetes mellitus without complications: Secondary | ICD-10-CM | POA: Diagnosis not present

## 2018-08-29 DIAGNOSIS — H02831 Dermatochalasis of right upper eyelid: Secondary | ICD-10-CM | POA: Diagnosis not present

## 2018-08-29 DIAGNOSIS — H18413 Arcus senilis, bilateral: Secondary | ICD-10-CM | POA: Diagnosis not present

## 2018-09-15 DIAGNOSIS — I10 Essential (primary) hypertension: Secondary | ICD-10-CM | POA: Diagnosis not present

## 2018-09-15 DIAGNOSIS — I639 Cerebral infarction, unspecified: Secondary | ICD-10-CM | POA: Diagnosis not present

## 2018-09-15 DIAGNOSIS — E119 Type 2 diabetes mellitus without complications: Secondary | ICD-10-CM | POA: Diagnosis not present

## 2018-10-17 DIAGNOSIS — E119 Type 2 diabetes mellitus without complications: Secondary | ICD-10-CM | POA: Diagnosis not present

## 2018-10-17 DIAGNOSIS — I10 Essential (primary) hypertension: Secondary | ICD-10-CM | POA: Diagnosis not present

## 2018-10-17 DIAGNOSIS — I639 Cerebral infarction, unspecified: Secondary | ICD-10-CM | POA: Diagnosis not present

## 2018-11-13 DIAGNOSIS — Z713 Dietary counseling and surveillance: Secondary | ICD-10-CM | POA: Diagnosis not present

## 2018-11-13 DIAGNOSIS — I10 Essential (primary) hypertension: Secondary | ICD-10-CM | POA: Diagnosis not present

## 2018-11-13 DIAGNOSIS — Z299 Encounter for prophylactic measures, unspecified: Secondary | ICD-10-CM | POA: Diagnosis not present

## 2018-11-13 DIAGNOSIS — E1165 Type 2 diabetes mellitus with hyperglycemia: Secondary | ICD-10-CM | POA: Diagnosis not present

## 2018-11-13 DIAGNOSIS — Z6832 Body mass index (BMI) 32.0-32.9, adult: Secondary | ICD-10-CM | POA: Diagnosis not present

## 2018-11-15 DIAGNOSIS — I639 Cerebral infarction, unspecified: Secondary | ICD-10-CM | POA: Diagnosis not present

## 2018-11-15 DIAGNOSIS — I10 Essential (primary) hypertension: Secondary | ICD-10-CM | POA: Diagnosis not present

## 2018-11-15 DIAGNOSIS — E119 Type 2 diabetes mellitus without complications: Secondary | ICD-10-CM | POA: Diagnosis not present

## 2018-11-20 DIAGNOSIS — W1811XA Fall from or off toilet without subsequent striking against object, initial encounter: Secondary | ICD-10-CM | POA: Diagnosis not present

## 2018-11-20 DIAGNOSIS — Z299 Encounter for prophylactic measures, unspecified: Secondary | ICD-10-CM | POA: Diagnosis not present

## 2018-11-20 DIAGNOSIS — Z6832 Body mass index (BMI) 32.0-32.9, adult: Secondary | ICD-10-CM | POA: Diagnosis not present

## 2018-11-20 DIAGNOSIS — E7800 Pure hypercholesterolemia, unspecified: Secondary | ICD-10-CM | POA: Diagnosis not present

## 2018-11-20 DIAGNOSIS — I10 Essential (primary) hypertension: Secondary | ICD-10-CM | POA: Diagnosis not present

## 2018-11-20 DIAGNOSIS — R0781 Pleurodynia: Secondary | ICD-10-CM | POA: Diagnosis not present

## 2018-11-20 DIAGNOSIS — S2242XA Multiple fractures of ribs, left side, initial encounter for closed fracture: Secondary | ICD-10-CM | POA: Diagnosis not present

## 2018-12-04 DIAGNOSIS — S2232XA Fracture of one rib, left side, initial encounter for closed fracture: Secondary | ICD-10-CM | POA: Diagnosis not present

## 2018-12-04 DIAGNOSIS — Z6831 Body mass index (BMI) 31.0-31.9, adult: Secondary | ICD-10-CM | POA: Diagnosis not present

## 2018-12-04 DIAGNOSIS — Z299 Encounter for prophylactic measures, unspecified: Secondary | ICD-10-CM | POA: Diagnosis not present

## 2018-12-04 DIAGNOSIS — I739 Peripheral vascular disease, unspecified: Secondary | ICD-10-CM | POA: Diagnosis not present

## 2018-12-04 DIAGNOSIS — E1165 Type 2 diabetes mellitus with hyperglycemia: Secondary | ICD-10-CM | POA: Diagnosis not present

## 2018-12-04 DIAGNOSIS — I10 Essential (primary) hypertension: Secondary | ICD-10-CM | POA: Diagnosis not present

## 2018-12-22 DIAGNOSIS — Z299 Encounter for prophylactic measures, unspecified: Secondary | ICD-10-CM | POA: Diagnosis not present

## 2018-12-22 DIAGNOSIS — E1165 Type 2 diabetes mellitus with hyperglycemia: Secondary | ICD-10-CM | POA: Diagnosis not present

## 2018-12-22 DIAGNOSIS — I619 Nontraumatic intracerebral hemorrhage, unspecified: Secondary | ICD-10-CM | POA: Diagnosis not present

## 2018-12-22 DIAGNOSIS — N390 Urinary tract infection, site not specified: Secondary | ICD-10-CM | POA: Diagnosis not present

## 2018-12-22 DIAGNOSIS — I10 Essential (primary) hypertension: Secondary | ICD-10-CM | POA: Diagnosis not present

## 2018-12-22 DIAGNOSIS — Z6832 Body mass index (BMI) 32.0-32.9, adult: Secondary | ICD-10-CM | POA: Diagnosis not present

## 2018-12-25 DIAGNOSIS — M79674 Pain in right toe(s): Secondary | ICD-10-CM | POA: Diagnosis not present

## 2018-12-25 DIAGNOSIS — B351 Tinea unguium: Secondary | ICD-10-CM | POA: Diagnosis not present

## 2018-12-25 DIAGNOSIS — M79675 Pain in left toe(s): Secondary | ICD-10-CM | POA: Diagnosis not present

## 2018-12-25 DIAGNOSIS — E1151 Type 2 diabetes mellitus with diabetic peripheral angiopathy without gangrene: Secondary | ICD-10-CM | POA: Diagnosis not present

## 2019-01-09 DIAGNOSIS — R202 Paresthesia of skin: Secondary | ICD-10-CM | POA: Diagnosis not present

## 2019-01-09 DIAGNOSIS — Z1339 Encounter for screening examination for other mental health and behavioral disorders: Secondary | ICD-10-CM | POA: Diagnosis not present

## 2019-01-09 DIAGNOSIS — Z7189 Other specified counseling: Secondary | ICD-10-CM | POA: Diagnosis not present

## 2019-01-09 DIAGNOSIS — Z0000 Encounter for general adult medical examination without abnormal findings: Secondary | ICD-10-CM | POA: Diagnosis not present

## 2019-01-09 DIAGNOSIS — R5383 Other fatigue: Secondary | ICD-10-CM | POA: Diagnosis not present

## 2019-01-09 DIAGNOSIS — Z1331 Encounter for screening for depression: Secondary | ICD-10-CM | POA: Diagnosis not present

## 2019-01-09 DIAGNOSIS — Z299 Encounter for prophylactic measures, unspecified: Secondary | ICD-10-CM | POA: Diagnosis not present

## 2019-01-09 DIAGNOSIS — F419 Anxiety disorder, unspecified: Secondary | ICD-10-CM | POA: Diagnosis not present

## 2019-01-09 DIAGNOSIS — R200 Anesthesia of skin: Secondary | ICD-10-CM | POA: Diagnosis not present

## 2019-01-09 DIAGNOSIS — Z79899 Other long term (current) drug therapy: Secondary | ICD-10-CM | POA: Diagnosis not present

## 2019-01-09 DIAGNOSIS — Z1211 Encounter for screening for malignant neoplasm of colon: Secondary | ICD-10-CM | POA: Diagnosis not present

## 2019-01-09 DIAGNOSIS — E7800 Pure hypercholesterolemia, unspecified: Secondary | ICD-10-CM | POA: Diagnosis not present

## 2019-01-09 DIAGNOSIS — Z125 Encounter for screening for malignant neoplasm of prostate: Secondary | ICD-10-CM | POA: Diagnosis not present

## 2019-01-09 DIAGNOSIS — Z6830 Body mass index (BMI) 30.0-30.9, adult: Secondary | ICD-10-CM | POA: Diagnosis not present

## 2019-01-15 DIAGNOSIS — L97519 Non-pressure chronic ulcer of other part of right foot with unspecified severity: Secondary | ICD-10-CM | POA: Diagnosis not present

## 2019-01-19 DIAGNOSIS — I10 Essential (primary) hypertension: Secondary | ICD-10-CM | POA: Diagnosis not present

## 2019-01-19 DIAGNOSIS — I639 Cerebral infarction, unspecified: Secondary | ICD-10-CM | POA: Diagnosis not present

## 2019-01-19 DIAGNOSIS — E119 Type 2 diabetes mellitus without complications: Secondary | ICD-10-CM | POA: Diagnosis not present

## 2019-02-19 DIAGNOSIS — Z6830 Body mass index (BMI) 30.0-30.9, adult: Secondary | ICD-10-CM | POA: Diagnosis not present

## 2019-02-19 DIAGNOSIS — I739 Peripheral vascular disease, unspecified: Secondary | ICD-10-CM | POA: Diagnosis not present

## 2019-02-19 DIAGNOSIS — E1165 Type 2 diabetes mellitus with hyperglycemia: Secondary | ICD-10-CM | POA: Diagnosis not present

## 2019-02-19 DIAGNOSIS — I10 Essential (primary) hypertension: Secondary | ICD-10-CM | POA: Diagnosis not present

## 2019-02-19 DIAGNOSIS — Z299 Encounter for prophylactic measures, unspecified: Secondary | ICD-10-CM | POA: Diagnosis not present

## 2019-02-26 DIAGNOSIS — E1151 Type 2 diabetes mellitus with diabetic peripheral angiopathy without gangrene: Secondary | ICD-10-CM | POA: Diagnosis not present

## 2019-02-26 DIAGNOSIS — L97519 Non-pressure chronic ulcer of other part of right foot with unspecified severity: Secondary | ICD-10-CM | POA: Diagnosis not present

## 2019-03-05 DIAGNOSIS — M79674 Pain in right toe(s): Secondary | ICD-10-CM | POA: Diagnosis not present

## 2019-03-05 DIAGNOSIS — B351 Tinea unguium: Secondary | ICD-10-CM | POA: Diagnosis not present

## 2019-03-05 DIAGNOSIS — M79675 Pain in left toe(s): Secondary | ICD-10-CM | POA: Diagnosis not present

## 2019-03-05 DIAGNOSIS — E1151 Type 2 diabetes mellitus with diabetic peripheral angiopathy without gangrene: Secondary | ICD-10-CM | POA: Diagnosis not present

## 2019-03-06 DIAGNOSIS — H02831 Dermatochalasis of right upper eyelid: Secondary | ICD-10-CM | POA: Diagnosis not present

## 2019-03-06 DIAGNOSIS — E113293 Type 2 diabetes mellitus with mild nonproliferative diabetic retinopathy without macular edema, bilateral: Secondary | ICD-10-CM | POA: Diagnosis not present

## 2019-03-06 DIAGNOSIS — H40013 Open angle with borderline findings, low risk, bilateral: Secondary | ICD-10-CM | POA: Diagnosis not present

## 2019-03-06 DIAGNOSIS — Z961 Presence of intraocular lens: Secondary | ICD-10-CM | POA: Diagnosis not present

## 2019-03-06 DIAGNOSIS — H02834 Dermatochalasis of left upper eyelid: Secondary | ICD-10-CM | POA: Diagnosis not present

## 2019-03-06 DIAGNOSIS — H18413 Arcus senilis, bilateral: Secondary | ICD-10-CM | POA: Diagnosis not present

## 2019-03-09 ENCOUNTER — Encounter (HOSPITAL_COMMUNITY): Payer: Self-pay | Admitting: *Deleted

## 2019-03-09 ENCOUNTER — Emergency Department (HOSPITAL_COMMUNITY): Payer: Medicare Other

## 2019-03-09 ENCOUNTER — Emergency Department (HOSPITAL_COMMUNITY)
Admission: EM | Admit: 2019-03-09 | Discharge: 2019-03-09 | Disposition: A | Discharge status: Home / Self Care | Payer: Medicare Other | Attending: Emergency Medicine | Admitting: Emergency Medicine

## 2019-03-09 ENCOUNTER — Other Ambulatory Visit: Payer: Self-pay

## 2019-03-09 DIAGNOSIS — R000 Tachycardia, unspecified: Secondary | ICD-10-CM | POA: Diagnosis not present

## 2019-03-09 DIAGNOSIS — Z7982 Long term (current) use of aspirin: Secondary | ICD-10-CM | POA: Diagnosis not present

## 2019-03-09 DIAGNOSIS — N189 Chronic kidney disease, unspecified: Secondary | ICD-10-CM | POA: Insufficient documentation

## 2019-03-09 DIAGNOSIS — R935 Abnormal findings on diagnostic imaging of other abdominal regions, including retroperitoneum: Secondary | ICD-10-CM | POA: Diagnosis not present

## 2019-03-09 DIAGNOSIS — I619 Nontraumatic intracerebral hemorrhage, unspecified: Secondary | ICD-10-CM | POA: Diagnosis not present

## 2019-03-09 DIAGNOSIS — L03116 Cellulitis of left lower limb: Secondary | ICD-10-CM | POA: Diagnosis not present

## 2019-03-09 DIAGNOSIS — E7800 Pure hypercholesterolemia, unspecified: Secondary | ICD-10-CM | POA: Diagnosis not present

## 2019-03-09 DIAGNOSIS — Z6832 Body mass index (BMI) 32.0-32.9, adult: Secondary | ICD-10-CM | POA: Diagnosis not present

## 2019-03-09 DIAGNOSIS — R109 Unspecified abdominal pain: Secondary | ICD-10-CM | POA: Insufficient documentation

## 2019-03-09 DIAGNOSIS — M7989 Other specified soft tissue disorders: Secondary | ICD-10-CM | POA: Diagnosis not present

## 2019-03-09 DIAGNOSIS — R52 Pain, unspecified: Secondary | ICD-10-CM

## 2019-03-09 DIAGNOSIS — E1122 Type 2 diabetes mellitus with diabetic chronic kidney disease: Secondary | ICD-10-CM | POA: Insufficient documentation

## 2019-03-09 DIAGNOSIS — I129 Hypertensive chronic kidney disease with stage 1 through stage 4 chronic kidney disease, or unspecified chronic kidney disease: Secondary | ICD-10-CM | POA: Diagnosis not present

## 2019-03-09 DIAGNOSIS — R2243 Localized swelling, mass and lump, lower limb, bilateral: Secondary | ICD-10-CM | POA: Diagnosis not present

## 2019-03-09 DIAGNOSIS — I10 Essential (primary) hypertension: Secondary | ICD-10-CM | POA: Diagnosis not present

## 2019-03-09 DIAGNOSIS — Z7984 Long term (current) use of oral hypoglycemic drugs: Secondary | ICD-10-CM | POA: Diagnosis not present

## 2019-03-09 DIAGNOSIS — R630 Anorexia: Secondary | ICD-10-CM | POA: Diagnosis not present

## 2019-03-09 DIAGNOSIS — R531 Weakness: Secondary | ICD-10-CM | POA: Diagnosis not present

## 2019-03-09 DIAGNOSIS — I2510 Atherosclerotic heart disease of native coronary artery without angina pectoris: Secondary | ICD-10-CM | POA: Insufficient documentation

## 2019-03-09 DIAGNOSIS — Z20822 Contact with and (suspected) exposure to covid-19: Secondary | ICD-10-CM | POA: Diagnosis not present

## 2019-03-09 DIAGNOSIS — I739 Peripheral vascular disease, unspecified: Secondary | ICD-10-CM | POA: Diagnosis not present

## 2019-03-09 DIAGNOSIS — Z79899 Other long term (current) drug therapy: Secondary | ICD-10-CM | POA: Diagnosis not present

## 2019-03-09 DIAGNOSIS — Z299 Encounter for prophylactic measures, unspecified: Secondary | ICD-10-CM | POA: Diagnosis not present

## 2019-03-09 DIAGNOSIS — Z87891 Personal history of nicotine dependence: Secondary | ICD-10-CM | POA: Diagnosis not present

## 2019-03-09 DIAGNOSIS — E162 Hypoglycemia, unspecified: Secondary | ICD-10-CM | POA: Diagnosis not present

## 2019-03-09 LAB — COMPREHENSIVE METABOLIC PANEL
ALT: 90 U/L — ABNORMAL HIGH (ref 0–44)
AST: 110 U/L — ABNORMAL HIGH (ref 15–41)
Albumin: 3.6 g/dL (ref 3.5–5.0)
Alkaline Phosphatase: 128 U/L — ABNORMAL HIGH (ref 38–126)
Anion gap: 15 (ref 5–15)
BUN: 28 mg/dL — ABNORMAL HIGH (ref 8–23)
CO2: 21 mmol/L — ABNORMAL LOW (ref 22–32)
Calcium: 9.2 mg/dL (ref 8.9–10.3)
Chloride: 93 mmol/L — ABNORMAL LOW (ref 98–111)
Creatinine, Ser: 0.76 mg/dL (ref 0.61–1.24)
GFR calc Af Amer: >60 mL/min (ref >60)
GFR calc non Af Amer: >60 mL/min (ref >60)
Glucose, Bld: 122 mg/dL — ABNORMAL HIGH (ref 70–99)
Potassium: 4.7 mmol/L (ref 3.5–5.1)
Sodium: 129 mmol/L — ABNORMAL LOW (ref 135–145)
Total Bilirubin: 1.8 mg/dL — ABNORMAL HIGH (ref 0.3–1.2)
Total Protein: 6.8 g/dL (ref 6.5–8.1)

## 2019-03-09 LAB — TROPONIN I (HIGH SENSITIVITY)
Troponin I (High Sensitivity): 16 ng/L (ref <18)
Troponin I (High Sensitivity): 17 ng/L (ref <18)

## 2019-03-09 LAB — CBC WITH DIFFERENTIAL/PLATELET
Abs Immature Granulocytes: 0.04 10*3/uL (ref 0.00–0.07)
Basophils Absolute: 0 10*3/uL (ref 0.0–0.1)
Basophils Relative: 0 %
Eosinophils Absolute: 0 10*3/uL (ref 0.0–0.5)
Eosinophils Relative: 0 %
HCT: 44.2 % (ref 39.0–52.0)
Hemoglobin: 14.4 g/dL (ref 13.0–17.0)
Immature Granulocytes: 0 %
Lymphocytes Relative: 16 %
Lymphs Abs: 1.8 10*3/uL (ref 0.7–4.0)
MCH: 31.1 pg (ref 26.0–34.0)
MCHC: 32.6 g/dL (ref 30.0–36.0)
MCV: 95.5 fL (ref 80.0–100.0)
Monocytes Absolute: 0.9 10*3/uL (ref 0.1–1.0)
Monocytes Relative: 8 %
Neutro Abs: 8.8 10*3/uL — ABNORMAL HIGH (ref 1.7–7.7)
Neutrophils Relative %: 76 %
Platelets: 305 10*3/uL (ref 150–400)
RBC: 4.63 MIL/uL (ref 4.22–5.81)
RDW: 13.2 % (ref 11.5–15.5)
WBC: 11.6 10*3/uL — ABNORMAL HIGH (ref 4.0–10.5)
nRBC: 0 % (ref 0.0–0.2)

## 2019-03-09 LAB — LIPASE, BLOOD: Lipase: 17 U/L (ref 11–51)

## 2019-03-09 LAB — CBG MONITORING, ED: Glucose-Capillary: 116 mg/dL — ABNORMAL HIGH (ref 70–99)

## 2019-03-09 LAB — POC SARS CORONAVIRUS 2 AG -  ED: SARS Coronavirus 2 Ag: NEGATIVE

## 2019-03-09 MED ORDER — SODIUM CHLORIDE 0.9 % IV SOLN
1.0000 g | Freq: Once | INTRAVENOUS | Status: AC
  Administered 2019-03-09: 1 g via INTRAVENOUS
  Filled 2019-03-09: qty 10

## 2019-03-09 MED ORDER — IOHEXOL 300 MG/ML  SOLN
100.0000 mL | Freq: Once | INTRAMUSCULAR | Status: AC | PRN
  Administered 2019-03-09: 17:00:00 100 mL via INTRAVENOUS

## 2019-03-09 MED ORDER — CEPHALEXIN 500 MG PO CAPS: 500.0000 mg | ORAL_CAPSULE | Freq: Four times a day (QID) | ORAL | 0 refills | Status: DC

## 2019-03-09 NOTE — ED Triage Notes (Signed)
Pt sent to the er by pcp Dr Sherryll Burger in [place] for further evaluation of weakness, low blood sugar, weeping to bilateral lower legs, cyanosis to bilateral feet. Family mbr reports that symptoms started yesterday.

## 2019-03-09 NOTE — ED Notes (Signed)
Pt to CT

## 2019-03-09 NOTE — ED Provider Notes (Signed)
[facility] EMERGENCY DEPARTMENT Provider Note   CSN: [id number] Arrival date & time: 03/09/19  1306     History Chief Complaint  Patient presents with  . Weakness    Matthew Mccormick is a 64 y.o. male.  Patient is a 64 year old male with extensive past medical history including diabetes, hypertension, prior CVA with left hemiparesis, A. fib, chronic renal insufficiency.  He presents today for evaluation of weakness and fatigue.  Patient offers little history secondary to acuity of condition and history primarily taken from his cousin who was present at bedside and is his medical power of attorney.  According to her, patient has been less active and weak since yesterday.  His blood sugar was apparently in the 50s this morning.  She describes decreased appetite and weakness to the point that he cannot stand and walk.  Patient denies to me he is experiencing any specific symptoms.  The history is provided by the patient.  Weakness Severity:  Moderate Onset quality:  Sudden Duration:  24 hours Timing:  Constant Progression:  Worsening Chronicity:  New Relieved by:  Nothing Worsened by:  Nothing Ineffective treatments:  None tried Associated symptoms: difficulty walking and nausea   Associated symptoms: no abdominal pain, no fever and no vomiting        Past Medical History:  Diagnosis Date  . 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 Chronic kidney disease   . Diabetes mellitus without complication (HCC)   . Hyperlipidemia   . Hypertension   . Slow rate of speech   . Stroke [facility])    1 year ago-left and shaky and unsteady on feet.    Patient Active Problem List   Diagnosis Date Noted  . Diabetes mellitus (HCC) 07/26/2016  . Hypertension 07/26/2016  . Coronary artery disease 07/26/2016  . History of CVA (cerebrovascular accident) 07/26/2016  . Atrial fibrillation (HCC) 07/26/2016  . Dyslipidemia 07/26/2016  . Former cigarette smoker 07/26/2016  .  Osteomyelitis of toe of left foot (HCC) 07/26/2016    Past Surgical History:  Procedure Laterality Date  . CATARACT EXTRACTION W/PHACO Right 06/18/2013   Procedure: CATARACT EXTRACTION PHACO AND INTRAOCULAR LENS PLACEMENT (IOC);  Surgeon: Gemma Payor, MD;  Location: [facility] ORS;  Service: Ophthalmology;  Laterality: Right;  CDE 34.55  . ORIF ANKLE DISLOCATION Right        No family history on file.  Social History   Tobacco Use  . Smoking status: Former Smoker    Packs/day: 1.00    Years: 1.00    Pack years: 1.00    Types: Cigarettes    Quit date: 06/16/1991    Years since quitting: 27.7  . Smokeless tobacco: Never Used  Substance Use Topics  . Alcohol use: No  . Drug use: No    Home Medications Prior to Admission medications   Medication Sig Start Date End Date Taking? Authorizing Provider  amLODipine (NORVASC) 10 MG tablet Take 10 mg by mouth daily.    [provider]  aspirin EC 81 MG tablet Take 81 mg by mouth daily.    [provider]  atenolol (TENORMIN) 100 MG tablet Take 100 mg by mouth daily.    [provider]  atorvastatin (LIPITOR) 40 MG tablet Take 40 mg by mouth daily.    [provider]  carvedilol (COREG) 3.125 MG tablet Take by mouth. 03/12/16 11/07/17  [provider]  clopidogrel (PLAVIX) 75 MG tablet Take by mouth.  [provider]  doxycycline (VIBRAMYCIN) 100 MG capsule Take 1 capsule (100 mg total) by mouth 2 (two) times daily. 07/27/16   Michel Bickers, MD  empagliflozin (JARDIANCE) 10 MG TABS tablet Take by mouth.    [provider]  insulin detemir (LEVEMIR) 100 UNIT/ML injection Inject 40 Units into the skin daily.    [provider]  lisinopril (PRINIVIL,ZESTRIL) 10 MG tablet Take 10 mg by mouth daily.    [provider]  metFORMIN (GLUCOPHAGE) 1000 MG tablet Take 1,000 mg by mouth 2 (two) times daily with a meal.    [provider]  pioglitazone (ACTOS) 30 MG tablet  Take 30 mg by mouth daily.    [provider]  Terbinafine HCl (LAMISIL PO) Take by mouth.    [provider]    Allergies    Patient has no known allergies.  Review of Systems   Review of Systems  Constitutional: Negative for fever.  Gastrointestinal: Positive for nausea. Negative for abdominal pain and vomiting.  Neurological: Positive for weakness.  All other systems reviewed and are negative.   Physical Exam Updated Vital Signs BP 101/68   Pulse (!) 109   Temp 97.7 F (36.5 C) (Oral)   Resp 18   Ht 5\' 8"  (1.727 m)   Wt 95.3 kg   SpO2 100%   BMI 31.93 kg/m   Physical Exam Vitals and nursing note reviewed.  Constitutional:      General: He is not in acute distress.    Appearance: He is well-developed. He is not diaphoretic.  HENT:     Head: Normocephalic and atraumatic.  Eyes:     Extraocular Movements: Extraocular movements intact.     Pupils: Pupils are equal, round, and reactive to light.  Cardiovascular:     Rate and Rhythm: Normal rate and regular rhythm.     Heart sounds: No murmur. No friction rub.  Pulmonary:     Effort: Pulmonary effort is normal. No respiratory distress.     Breath sounds: Normal breath sounds. No wheezing or rales.  Abdominal:     General: Bowel sounds are normal. There is no distension.     Palpations: Abdomen is soft.     Tenderness: There is no abdominal tenderness.  Musculoskeletal:        General: Normal range of motion.     Cervical back: Normal range of motion and neck supple.     Right lower leg: Edema present.     Left lower leg: Edema present.     Comments: There is edema of both lower extremities, however left greater than right.  There is some purplish discoloration of both feet and both are cool to the touch, however pulses are Dopplerable.  Skin:    General: Skin is warm and dry.  Neurological:     General: No focal deficit present.     Mental Status: He is alert and oriented to person, place, and  time.     Cranial Nerves: No cranial nerve deficit.     Coordination: Coordination normal.     Comments: Baseline left hemiparesis is noted.  He is somewhat slow to respond and is noted to have generalized weakness.     ED Results / Procedures / Treatments   Labs (all labs ordered are listed, but only abnormal results are displayed) Labs Reviewed  CBG MONITORING, ED - Abnormal; Notable for the following components:      Result Value   Glucose-Capillary 116 (*)  All other components within normal limits  COMPREHENSIVE METABOLIC PANEL  CBC WITH DIFFERENTIAL/PLATELET  LIPASE, BLOOD  URINALYSIS, ROUTINE W REFLEX MICROSCOPIC  POC SARS CORONAVIRUS 2 AG -  ED  TROPONIN I (HIGH SENSITIVITY)    EKG None  Radiology No results found.  Procedures Procedures (including critical care time)  Medications Ordered in ED Medications - No data to display  ED Course  I have reviewed the triage vital signs and the nursing notes.  Pertinent labs & imaging results that were available during my care of the patient were reviewed by me and considered in my medical decision making (see chart for details).    MDM Rules/Calculators/A&P  Patient with extensive past medical history as outlined in the HPI.  He presents today with complaints of weakness.  Patient initially somnolent upon presentation, however has returned to his baseline by the end of the visit.  Patient's work-up shows a negative head CT, negative abdominal CT, unremarkable laboratory studies, and ultrasound of the lower extremities shows no evidence for DVT.  Pulses are dopplerable in both feet.  There are some blisters and mild erythema to the left lower leg.  This will be treated for presumed cellulitis with ceftriaxone and discharged with Keflex.  At this point, I have not found a definite reason for his somnolence and weakness, however he appears clinically much improved and work-up has been negative.  I believe patient is  appropriate for discharge.  He is to return as needed for any problems.  Final Clinical Impression(s) / ED Diagnoses Final diagnoses:  None    Rx / DC Orders ED Discharge Orders    None       Geoffery Lyons, MD 03/09/19 [phone]

## 2019-03-09 NOTE — Discharge Instructions (Addendum)
Begin taking Keflex as prescribed.  Continue other medications as previously prescribed.  Return to the emergency department if you develop high fever, worsening weakness, or other new and concerning symptoms.

## 2019-03-12 DIAGNOSIS — Z713 Dietary counseling and surveillance: Secondary | ICD-10-CM | POA: Diagnosis not present

## 2019-03-12 DIAGNOSIS — Z6832 Body mass index (BMI) 32.0-32.9, adult: Secondary | ICD-10-CM | POA: Diagnosis not present

## 2019-03-12 DIAGNOSIS — Z299 Encounter for prophylactic measures, unspecified: Secondary | ICD-10-CM | POA: Diagnosis not present

## 2019-03-12 DIAGNOSIS — I10 Essential (primary) hypertension: Secondary | ICD-10-CM | POA: Diagnosis not present

## 2019-03-12 DIAGNOSIS — L0390 Cellulitis, unspecified: Secondary | ICD-10-CM | POA: Diagnosis not present

## 2019-03-13 ENCOUNTER — Emergency Department (HOSPITAL_COMMUNITY): Payer: Medicare Other

## 2019-03-13 ENCOUNTER — Other Ambulatory Visit: Payer: Self-pay

## 2019-03-13 ENCOUNTER — Inpatient Hospital Stay (HOSPITAL_COMMUNITY)
Admission: EM | Admit: 2019-03-13 | Discharge: 2019-03-30 | DRG: 853 | Disposition: A | Discharge status: Skilled Nursing Facility | Payer: Medicare Other | Attending: Student | Admitting: Student

## 2019-03-13 ENCOUNTER — Encounter (HOSPITAL_COMMUNITY): Payer: Self-pay

## 2019-03-13 DIAGNOSIS — Z20822 Contact with and (suspected) exposure to covid-19: Secondary | ICD-10-CM | POA: Diagnosis present

## 2019-03-13 DIAGNOSIS — I361 Nonrheumatic tricuspid (valve) insufficiency: Secondary | ICD-10-CM | POA: Diagnosis not present

## 2019-03-13 DIAGNOSIS — R6521 Severe sepsis with septic shock: Secondary | ICD-10-CM | POA: Diagnosis not present

## 2019-03-13 DIAGNOSIS — I878 Other specified disorders of veins: Secondary | ICD-10-CM | POA: Diagnosis not present

## 2019-03-13 DIAGNOSIS — L8990 Pressure ulcer of unspecified site, unspecified stage: Secondary | ICD-10-CM | POA: Insufficient documentation

## 2019-03-13 DIAGNOSIS — I25110 Atherosclerotic heart disease of native coronary artery with unstable angina pectoris: Secondary | ICD-10-CM | POA: Diagnosis not present

## 2019-03-13 DIAGNOSIS — I255 Ischemic cardiomyopathy: Secondary | ICD-10-CM | POA: Diagnosis present

## 2019-03-13 DIAGNOSIS — J90 Pleural effusion, not elsewhere classified: Secondary | ICD-10-CM | POA: Diagnosis not present

## 2019-03-13 DIAGNOSIS — E1151 Type 2 diabetes mellitus with diabetic peripheral angiopathy without gangrene: Secondary | ICD-10-CM | POA: Diagnosis not present

## 2019-03-13 DIAGNOSIS — G9341 Metabolic encephalopathy: Secondary | ICD-10-CM | POA: Diagnosis not present

## 2019-03-13 DIAGNOSIS — J189 Pneumonia, unspecified organism: Secondary | ICD-10-CM | POA: Diagnosis present

## 2019-03-13 DIAGNOSIS — Z87891 Personal history of nicotine dependence: Secondary | ICD-10-CM

## 2019-03-13 DIAGNOSIS — Z79899 Other long term (current) drug therapy: Secondary | ICD-10-CM

## 2019-03-13 DIAGNOSIS — I4891 Unspecified atrial fibrillation: Secondary | ICD-10-CM | POA: Diagnosis present

## 2019-03-13 DIAGNOSIS — F410 Panic disorder [episodic paroxysmal anxiety] without agoraphobia: Secondary | ICD-10-CM | POA: Diagnosis not present

## 2019-03-13 DIAGNOSIS — L97929 Non-pressure chronic ulcer of unspecified part of left lower leg with unspecified severity: Secondary | ICD-10-CM | POA: Diagnosis present

## 2019-03-13 DIAGNOSIS — R0781 Pleurodynia: Secondary | ICD-10-CM | POA: Diagnosis not present

## 2019-03-13 DIAGNOSIS — I493 Ventricular premature depolarization: Secondary | ICD-10-CM | POA: Diagnosis not present

## 2019-03-13 DIAGNOSIS — R791 Abnormal coagulation profile: Secondary | ICD-10-CM | POA: Insufficient documentation

## 2019-03-13 DIAGNOSIS — L97919 Non-pressure chronic ulcer of unspecified part of right lower leg with unspecified severity: Secondary | ICD-10-CM | POA: Diagnosis present

## 2019-03-13 DIAGNOSIS — I5023 Acute on chronic systolic (congestive) heart failure: Secondary | ICD-10-CM | POA: Diagnosis present

## 2019-03-13 DIAGNOSIS — A419 Sepsis, unspecified organism: Principal | ICD-10-CM | POA: Diagnosis present

## 2019-03-13 DIAGNOSIS — E1165 Type 2 diabetes mellitus with hyperglycemia: Secondary | ICD-10-CM | POA: Diagnosis present

## 2019-03-13 DIAGNOSIS — R7989 Other specified abnormal findings of blood chemistry: Secondary | ICD-10-CM

## 2019-03-13 DIAGNOSIS — J181 Lobar pneumonia, unspecified organism: Secondary | ICD-10-CM | POA: Diagnosis not present

## 2019-03-13 DIAGNOSIS — I2510 Atherosclerotic heart disease of native coronary artery without angina pectoris: Secondary | ICD-10-CM | POA: Diagnosis present

## 2019-03-13 DIAGNOSIS — R0602 Shortness of breath: Secondary | ICD-10-CM | POA: Diagnosis not present

## 2019-03-13 DIAGNOSIS — Z794 Long term (current) use of insulin: Secondary | ICD-10-CM | POA: Diagnosis not present

## 2019-03-13 DIAGNOSIS — I429 Cardiomyopathy, unspecified: Secondary | ICD-10-CM | POA: Diagnosis not present

## 2019-03-13 DIAGNOSIS — K761 Chronic passive congestion of liver: Secondary | ICD-10-CM | POA: Diagnosis present

## 2019-03-13 DIAGNOSIS — I739 Peripheral vascular disease, unspecified: Secondary | ICD-10-CM | POA: Diagnosis not present

## 2019-03-13 DIAGNOSIS — I509 Heart failure, unspecified: Secondary | ICD-10-CM | POA: Diagnosis not present

## 2019-03-13 DIAGNOSIS — I700 Atherosclerosis of aorta: Secondary | ICD-10-CM | POA: Diagnosis not present

## 2019-03-13 DIAGNOSIS — L89151 Pressure ulcer of sacral region, stage 1: Secondary | ICD-10-CM | POA: Diagnosis present

## 2019-03-13 DIAGNOSIS — Z452 Encounter for adjustment and management of vascular access device: Secondary | ICD-10-CM | POA: Diagnosis not present

## 2019-03-13 DIAGNOSIS — R570 Cardiogenic shock: Secondary | ICD-10-CM | POA: Diagnosis present

## 2019-03-13 DIAGNOSIS — E871 Hypo-osmolality and hyponatremia: Secondary | ICD-10-CM | POA: Diagnosis present

## 2019-03-13 DIAGNOSIS — R188 Other ascites: Secondary | ICD-10-CM | POA: Diagnosis not present

## 2019-03-13 DIAGNOSIS — I5082 Biventricular heart failure: Secondary | ICD-10-CM | POA: Diagnosis present

## 2019-03-13 DIAGNOSIS — I998 Other disorder of circulatory system: Secondary | ICD-10-CM | POA: Diagnosis not present

## 2019-03-13 DIAGNOSIS — I69354 Hemiplegia and hemiparesis following cerebral infarction affecting left non-dominant side: Secondary | ICD-10-CM

## 2019-03-13 DIAGNOSIS — I130 Hypertensive heart and chronic kidney disease with heart failure and stage 1 through stage 4 chronic kidney disease, or unspecified chronic kidney disease: Secondary | ICD-10-CM | POA: Diagnosis not present

## 2019-03-13 DIAGNOSIS — R5381 Other malaise: Secondary | ICD-10-CM | POA: Diagnosis not present

## 2019-03-13 DIAGNOSIS — K7200 Acute and subacute hepatic failure without coma: Secondary | ICD-10-CM | POA: Diagnosis not present

## 2019-03-13 DIAGNOSIS — Z7902 Long term (current) use of antithrombotics/antiplatelets: Secondary | ICD-10-CM

## 2019-03-13 DIAGNOSIS — R0902 Hypoxemia: Secondary | ICD-10-CM | POA: Diagnosis not present

## 2019-03-13 DIAGNOSIS — R2689 Other abnormalities of gait and mobility: Secondary | ICD-10-CM | POA: Diagnosis not present

## 2019-03-13 DIAGNOSIS — E876 Hypokalemia: Secondary | ICD-10-CM | POA: Diagnosis present

## 2019-03-13 DIAGNOSIS — N189 Chronic kidney disease, unspecified: Secondary | ICD-10-CM | POA: Diagnosis present

## 2019-03-13 DIAGNOSIS — Z955 Presence of coronary angioplasty implant and graft: Secondary | ICD-10-CM | POA: Diagnosis not present

## 2019-03-13 DIAGNOSIS — E785 Hyperlipidemia, unspecified: Secondary | ICD-10-CM | POA: Diagnosis not present

## 2019-03-13 DIAGNOSIS — I340 Nonrheumatic mitral (valve) insufficiency: Secondary | ICD-10-CM | POA: Diagnosis not present

## 2019-03-13 DIAGNOSIS — R1310 Dysphagia, unspecified: Secondary | ICD-10-CM | POA: Diagnosis present

## 2019-03-13 DIAGNOSIS — S301XXA Contusion of abdominal wall, initial encounter: Secondary | ICD-10-CM | POA: Diagnosis not present

## 2019-03-13 DIAGNOSIS — I248 Other forms of acute ischemic heart disease: Secondary | ICD-10-CM | POA: Diagnosis present

## 2019-03-13 DIAGNOSIS — I5020 Unspecified systolic (congestive) heart failure: Secondary | ICD-10-CM | POA: Diagnosis not present

## 2019-03-13 DIAGNOSIS — J9601 Acute respiratory failure with hypoxia: Secondary | ICD-10-CM | POA: Diagnosis not present

## 2019-03-13 DIAGNOSIS — R52 Pain, unspecified: Secondary | ICD-10-CM | POA: Diagnosis not present

## 2019-03-13 DIAGNOSIS — E872 Acidosis: Secondary | ICD-10-CM | POA: Diagnosis present

## 2019-03-13 DIAGNOSIS — S299XXA Unspecified injury of thorax, initial encounter: Secondary | ICD-10-CM | POA: Diagnosis not present

## 2019-03-13 DIAGNOSIS — E1122 Type 2 diabetes mellitus with diabetic chronic kidney disease: Secondary | ICD-10-CM | POA: Diagnosis present

## 2019-03-13 DIAGNOSIS — K759 Inflammatory liver disease, unspecified: Secondary | ICD-10-CM

## 2019-03-13 DIAGNOSIS — R339 Retention of urine, unspecified: Secondary | ICD-10-CM | POA: Diagnosis not present

## 2019-03-13 DIAGNOSIS — R519 Headache, unspecified: Secondary | ICD-10-CM | POA: Diagnosis not present

## 2019-03-13 DIAGNOSIS — R41841 Cognitive communication deficit: Secondary | ICD-10-CM | POA: Diagnosis not present

## 2019-03-13 DIAGNOSIS — I10 Essential (primary) hypertension: Secondary | ICD-10-CM | POA: Diagnosis not present

## 2019-03-13 DIAGNOSIS — Z7982 Long term (current) use of aspirin: Secondary | ICD-10-CM

## 2019-03-13 DIAGNOSIS — K7589 Other specified inflammatory liver diseases: Secondary | ICD-10-CM | POA: Diagnosis present

## 2019-03-13 DIAGNOSIS — R069 Unspecified abnormalities of breathing: Secondary | ICD-10-CM | POA: Diagnosis not present

## 2019-03-13 DIAGNOSIS — I272 Pulmonary hypertension, unspecified: Secondary | ICD-10-CM | POA: Diagnosis present

## 2019-03-13 DIAGNOSIS — I110 Hypertensive heart disease with heart failure: Secondary | ICD-10-CM | POA: Diagnosis not present

## 2019-03-13 DIAGNOSIS — F419 Anxiety disorder, unspecified: Secondary | ICD-10-CM | POA: Diagnosis not present

## 2019-03-13 DIAGNOSIS — M6281 Muscle weakness (generalized): Secondary | ICD-10-CM | POA: Diagnosis not present

## 2019-03-13 DIAGNOSIS — I5043 Acute on chronic combined systolic (congestive) and diastolic (congestive) heart failure: Secondary | ICD-10-CM | POA: Diagnosis not present

## 2019-03-13 DIAGNOSIS — Z9181 History of falling: Secondary | ICD-10-CM

## 2019-03-13 LAB — COMPREHENSIVE METABOLIC PANEL
ALT: 939 U/L — ABNORMAL HIGH (ref 0–44)
AST: 1189 U/L — ABNORMAL HIGH (ref 15–41)
Albumin: 4 g/dL (ref 3.5–5.0)
Alkaline Phosphatase: 329 U/L — ABNORMAL HIGH (ref 38–126)
Anion gap: 14 (ref 5–15)
BUN: 37 mg/dL — ABNORMAL HIGH (ref 8–23)
CO2: 24 mmol/L (ref 22–32)
Calcium: 9.3 mg/dL (ref 8.9–10.3)
Chloride: 90 mmol/L — ABNORMAL LOW (ref 98–111)
Creatinine, Ser: 1.17 mg/dL (ref 0.61–1.24)
GFR calc Af Amer: >60 mL/min (ref >60)
GFR calc non Af Amer: >60 mL/min (ref >60)
Glucose, Bld: 82 mg/dL (ref 70–99)
Potassium: 4.5 mmol/L (ref 3.5–5.1)
Sodium: 128 mmol/L — ABNORMAL LOW (ref 135–145)
Total Bilirubin: 2.7 mg/dL — ABNORMAL HIGH (ref 0.3–1.2)
Total Protein: 7.4 g/dL (ref 6.5–8.1)

## 2019-03-13 LAB — CBC WITH DIFFERENTIAL/PLATELET
Abs Immature Granulocytes: 0.09 10*3/uL — ABNORMAL HIGH (ref 0.00–0.07)
Basophils Absolute: 0 10*3/uL (ref 0.0–0.1)
Basophils Relative: 0 %
Eosinophils Absolute: 0 10*3/uL (ref 0.0–0.5)
Eosinophils Relative: 0 %
HCT: 45.7 % (ref 39.0–52.0)
Hemoglobin: 14.8 g/dL (ref 13.0–17.0)
Immature Granulocytes: 1 %
Lymphocytes Relative: 8 %
Lymphs Abs: 1.3 10*3/uL (ref 0.7–4.0)
MCH: 31 pg (ref 26.0–34.0)
MCHC: 32.4 g/dL (ref 30.0–36.0)
MCV: 95.8 fL (ref 80.0–100.0)
Monocytes Absolute: 2 10*3/uL — ABNORMAL HIGH (ref 0.1–1.0)
Monocytes Relative: 12 %
Neutro Abs: 13 10*3/uL — ABNORMAL HIGH (ref 1.7–7.7)
Neutrophils Relative %: 79 %
Platelets: 300 10*3/uL (ref 150–400)
RBC: 4.77 MIL/uL (ref 4.22–5.81)
RDW: 13.3 % (ref 11.5–15.5)
WBC: 16.3 10*3/uL — ABNORMAL HIGH (ref 4.0–10.5)
nRBC: 0.1 % (ref 0.0–0.2)

## 2019-03-13 LAB — RESPIRATORY PANEL BY RT PCR (FLU A&B, COVID)
Influenza A by PCR: NEGATIVE
Influenza B by PCR: NEGATIVE
SARS Coronavirus 2 by RT PCR: NEGATIVE

## 2019-03-13 LAB — GLUCOSE, CAPILLARY
Glucose-Capillary: 71 mg/dL (ref 70–99)
Glucose-Capillary: 80 mg/dL (ref 70–99)

## 2019-03-13 LAB — APTT: aPTT: 30 seconds (ref 24–36)

## 2019-03-13 LAB — TROPONIN I (HIGH SENSITIVITY)
Troponin I (High Sensitivity): 109 ng/L (ref <18)
Troponin I (High Sensitivity): 105 ng/L (ref <18)

## 2019-03-13 LAB — CK: Total CK: 79 U/L (ref 49–397)

## 2019-03-13 LAB — MRSA PCR SCREENING: MRSA by PCR: NEGATIVE

## 2019-03-13 LAB — PROTIME-INR
INR: 1.6 — ABNORMAL HIGH (ref 0.8–1.2)
Prothrombin Time: 19 seconds — ABNORMAL HIGH (ref 11.4–15.2)

## 2019-03-13 LAB — LACTIC ACID, PLASMA
Lactic Acid, Venous: 3.6 mmol/L (ref 0.5–1.9)
Lactic Acid, Venous: 3.4 mmol/L (ref 0.5–1.9)

## 2019-03-13 LAB — HIV ANTIBODY (ROUTINE TESTING W REFLEX): HIV Screen 4th Generation wRfx: NONREACTIVE

## 2019-03-13 LAB — ACETAMINOPHEN LEVEL: Acetaminophen (Tylenol), Serum: <10 ug/mL — ABNORMAL LOW (ref 10–30)

## 2019-03-13 MED ORDER — VANCOMYCIN HCL IN DEXTROSE 1-5 GM/200ML-% IV SOLN: 1000.0000 mg | Freq: Once | INTRAVENOUS | Status: DC

## 2019-03-13 MED ORDER — SODIUM CHLORIDE 0.9 % IV SOLN: INTRAVENOUS | Status: DC

## 2019-03-13 MED ORDER — SODIUM CHLORIDE 0.9 % IV SOLN
2.0000 g | Freq: Three times a day (TID) | INTRAVENOUS | Status: DC
  Administered 2019-03-13 – 2019-03-14 (×2): 2 g via INTRAVENOUS
  Filled 2019-03-13 (×2): qty 2

## 2019-03-13 MED ORDER — VANCOMYCIN HCL IN DEXTROSE 1-5 GM/200ML-% IV SOLN: 1000.0000 mg | INTRAVENOUS | Status: DC

## 2019-03-13 MED ORDER — ONDANSETRON HCL 4 MG PO TABS: 4.0000 mg | ORAL_TABLET | Freq: Four times a day (QID) | ORAL | Status: DC | PRN

## 2019-03-13 MED ORDER — METRONIDAZOLE IN NACL 5-0.79 MG/ML-% IV SOLN
500.0000 mg | Freq: Once | INTRAVENOUS | Status: AC
  Administered 2019-03-13: 500 mg via INTRAVENOUS
  Filled 2019-03-13: qty 100

## 2019-03-13 MED ORDER — IOHEXOL 350 MG/ML SOLN
100.0000 mL | Freq: Once | INTRAVENOUS | Status: AC | PRN
  Administered 2019-03-13: 100 mL via INTRAVENOUS

## 2019-03-13 MED ORDER — VANCOMYCIN HCL 2000 MG/400ML IV SOLN
2000.0000 mg | Freq: Once | INTRAVENOUS | Status: AC
  Administered 2019-03-13: 2000 mg via INTRAVENOUS
  Filled 2019-03-13: qty 400

## 2019-03-13 MED ORDER — SODIUM CHLORIDE 0.9 % IV BOLUS
500.0000 mL | Freq: Once | INTRAVENOUS | Status: AC
  Administered 2019-03-13: 500 mL via INTRAVENOUS

## 2019-03-13 MED ORDER — CLOPIDOGREL BISULFATE 75 MG PO TABS
75.0000 mg | ORAL_TABLET | Freq: Every day | ORAL | Status: DC
  Administered 2019-03-13 – 2019-03-30 (×17): 75 mg via ORAL
  Filled 2019-03-13 (×18): qty 1

## 2019-03-13 MED ORDER — SODIUM CHLORIDE 0.9 % IV BOLUS (SEPSIS)
1000.0000 mL | Freq: Once | INTRAVENOUS | Status: AC
  Administered 2019-03-13: 1000 mL via INTRAVENOUS

## 2019-03-13 MED ORDER — CHLORHEXIDINE GLUCONATE CLOTH 2 % EX PADS
6.0000 | MEDICATED_PAD | Freq: Every day | CUTANEOUS | Status: DC
  Administered 2019-03-14 – 2019-03-29 (×15): 6 via TOPICAL

## 2019-03-13 MED ORDER — ORAL CARE MOUTH RINSE
15.0000 mL | Freq: Two times a day (BID) | OROMUCOSAL | Status: DC
  Administered 2019-03-13 – 2019-03-30 (×31): 15 mL via OROMUCOSAL

## 2019-03-13 MED ORDER — SODIUM CHLORIDE 0.9 % IV SOLN
2.0000 g | Freq: Once | INTRAVENOUS | Status: AC
  Administered 2019-03-13: 2 g via INTRAVENOUS
  Filled 2019-03-13: qty 2

## 2019-03-13 MED ORDER — ONDANSETRON HCL 4 MG/2ML IJ SOLN: 4.0000 mg | Freq: Four times a day (QID) | INTRAMUSCULAR | Status: DC | PRN

## 2019-03-13 MED ORDER — VANCOMYCIN HCL 1250 MG/250ML IV SOLN
1250.0000 mg | Freq: Two times a day (BID) | INTRAVENOUS | Status: DC
  Administered 2019-03-14: 1250 mg via INTRAVENOUS
  Filled 2019-03-13: qty 250

## 2019-03-13 NOTE — Progress Notes (Signed)
Notified bedside nurse of need to draw repeat lactic acid. 

## 2019-03-13 NOTE — Progress Notes (Signed)
Pharmacy Antibiotic Note  Matthew Mccormick is a 64 y.o. male admitted on 03/13/2019 with unknown source of infection.  Pharmacy has been consulted for Vancomycin and Cefepime dosing.  Plan: Vancomycin 2000 mg IV x 1 dose. Vancomycin 1250 mg IV every 12 hours. Goal trough 15-20 mcg/mL. Cefepime 2000 mg IV every 8 hours. Monitor labs, c/s, and vanco level as indicated.  Weight: 200 lb (90.7 kg)  Temp (24hrs), Avg:97.7 F (36.5 C), Min:97.6 F (36.4 C), Max:97.7 F (36.5 C)  Recent Labs  Lab 03/09/19 1536 03/13/19 1056  WBC 11.6* 16.3*  CREATININE 0.76  --     Estimated Creatinine Clearance: 103.3 mL/min (by C-G formula based on SCr of 0.76 mg/dL).    No Known Allergies  Antimicrobials this admission: Vanco 2/2 >>  Cefepime 2/2 >>   Dose adjustments this admission: N/A  Microbiology results: 2/2 BCx: pending 2/2 UCx: pending   Thank you for allowing pharmacy to be a part of this patient's care.  Judeth Cornfield, PharmD Clinical Pharmacist 03/13/2019 11:08 AM

## 2019-03-13 NOTE — ED Notes (Signed)
Date and time results received: 03/13/19 11:29 AM  (use smartphrase ".now" to insert current time)  Test: Troponin Critical Value: 109  Name of Provider Notified: Clydie Braun PA  Orders Received? Or Actions Taken?: Orders Received - See Orders for details

## 2019-03-13 NOTE — ED Triage Notes (Addendum)
Pt fell sometime today and was in the floor for awhile. Is unsure of when he fell. Complaining of right rib pain. VSS. Pt diabetic. CBG 142. Pt lives at home by himself, but has aid that comes in during the day. Bilateral feet extremely cold.

## 2019-03-13 NOTE — H&P (Signed)
History and Physical    Matthew Mccormick VEH:[id number] DOB: 11-15-1955 DOA: 03/13/2019  PCP: Kirstie Peri, MD  Patient coming from: Home  I have personally briefly reviewed patient's old medical records in [facility]  Chief Complaint: Fall/weakness  HPI: This patient presents to the ER today after having had a fall at home.  He was trying to go to the bathroom and fell between his bed and dresser.  He says he was unable to get up. He was seen in the emergency room a few days ago with weakness.  At that time, he was able to be discharged home although it was seen that his feet were discolored, purplish and pulses were able to be ascertained by Doppler at that time. Overall he is a poor historian but he says he does not feel well overall. He is admitted now for further evaluation of probable sepsis, likely from pneumonia which has been seen on CT scan of the chest with the presence of right lower lobe pneumonia.  ED Course: Covid test is negative.He had investigation in the ER which included a CT angiogram of the chest and abdomen and pelvis which did not show any major abnormalities.  His blood work is profoundly abnormal with elevated INR and significantly elevated liver enzymes with a hepatitic picture.  In particular, his AST and ALT are 10 times worse than they were 4 days ago.  His alkaline phosphatase is also elevated, just over double of what it was a few days ago.  His bilirubin has increased.  His lactic acid is elevated.  However his CK is normal.  His troponin is elevated.  His white blood cell count is 16.3, worse than it was 4 days ago.  His platelet count is normal.  Review of Systems: As per HPI otherwise 10 point review of systems negative.    Past Medical History:  Diagnosis Date  . 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 Chronic kidney disease   . Diabetes mellitus without complication (HCC)   . Hyperlipidemia   . Hypertension   . Slow rate of speech   .  Stroke [facility])    1 year ago-left and shaky and unsteady on feet.    Past Surgical History:  Procedure Laterality Date  . CATARACT EXTRACTION W/PHACO Right 06/18/2013   Procedure: CATARACT EXTRACTION PHACO AND INTRAOCULAR LENS PLACEMENT (IOC);  Surgeon: Gemma Payor, MD;  Location: [facility];  Service: Ophthalmology;  Laterality: Right;  CDE 34.55  . ORIF ANKLE DISLOCATION Right     Social History:  reports that he quit smoking about 27 years ago. His smoking use included cigarettes. He has a 1.00 pack-year smoking history. He has never used smokeless tobacco. He reports that he does not drink alcohol or use drugs.  No Known Allergies    Prior to Admission medications   Medication Sig Start Date End Date Taking? Authorizing Provider  amLODipine (NORVASC) 10 MG tablet Take 10 mg by mouth daily.    [provider]  aspirin EC 81 MG tablet Take 81 mg by mouth daily.    [provider]  atenolol (TENORMIN) 100 MG tablet Take 100 mg by mouth daily.    [provider]  atorvastatin (LIPITOR) 40 MG tablet Take 40 mg by mouth daily.    [provider]  carvedilol (COREG) 3.125 MG tablet Take 3.125 mg by mouth 2 (two) times daily with a meal.  03/12/16 11/07/17  [provider]  cephALEXin (KEFLEX) 500 MG capsule Take 1 capsule (500 mg total) by mouth 4 (four) times daily. 03/09/19   Geoffery Lyons, MD  clopidogrel (PLAVIX) 75 MG tablet Take 75 mg by mouth daily.     [provider]  empagliflozin (JARDIANCE) 10 MG TABS tablet Take 10 mg by mouth daily.     [provider]  insulin detemir (LEVEMIR) 100 UNIT/ML injection Inject 40 Units into the skin daily.    [provider]  lisinopril (PRINIVIL,ZESTRIL) 10 MG tablet Take 10 mg by mouth daily.    [provider]  metFORMIN (GLUCOPHAGE) 1000 MG tablet Take 1,000 mg by mouth 2 (two) times daily with a meal.    [provider]  potassium chloride (KLOR-CON) 10 MEQ  tablet Take 10 mEq by mouth daily. 01/17/19   [provider]    Physical Exam: Vitals:   03/13/19 1033 03/13/19 1100 03/13/19 1130 03/13/19 1203  BP: 112/82  96/82 103/67  Pulse: (!) 106 (!) 102 96 82  Resp: 19 (!) 24 (!) 22 14  Temp:      TempSrc:      SpO2: 100% 100% 100% 100%  Weight:        He does not look well.  His extremities are mottled in his hands but his feet up to his mid lower leg are purplish in color and very cold.  I cannot easily feel his peripheral pulses.  He is hemodynamically stable although he was hypotensive earlier and is required intravenous bolus doses of fluids.  He is saturating well.  He appears to be in sinus rhythm.  He is alert and orientated.  Abdomen is soft and nontender.  He does appear to be alert and orientated and there are no new focal neurological signs.   Labs on Admission: I have personally reviewed following labs and imaging studies  CBC: Recent Labs  Lab 03/09/19 1536 03/13/19 1056  WBC 11.6* 16.3*  NEUTROABS 8.8* 13.0*  HGB 14.4 14.8  HCT 44.2 45.7  MCV 95.5 95.8  PLT 305 300   Basic Metabolic Panel: Recent Labs  Lab 03/09/19 1536 03/13/19 1056  NA 129* 128*  K 4.7 4.5  CL 93* 90*  CO2 21* 24  GLUCOSE 122* 82  BUN 28* 37*  CREATININE 0.76 1.17  CALCIUM 9.2 9.3   GFR: Estimated Creatinine Clearance: 70.7 mL/min (by C-G formula based on SCr of 1.17 mg/dL). Liver Function Tests: Recent Labs  Lab 03/09/19 1536 03/13/19 1056  AST 110* 1,189*  ALT 90* 939*  ALKPHOS 128* 329*  BILITOT 1.8* 2.7*  PROT 6.8 7.4  ALBUMIN 3.6 4.0   Recent Labs  Lab 03/09/19 1536  LIPASE 17   No results for input(s): AMMONIA in the last 168 hours. Coagulation Profile: Recent Labs  Lab 03/13/19 1056  INR 1.6*   Cardiac Enzymes: Recent Labs  Lab 03/13/19 1056  CKTOTAL 79   BNP (last 3 results) No results for input(s): PROBNP in the last 8760 hours. HbA1C: No results for input(s): HGBA1C in the last 72  hours. CBG: Recent Labs  Lab 03/09/19 1348  GLUCAP 116*   Lipid Profile: No results for input(s): CHOL, HDL, LDLCALC, TRIG, CHOLHDL, LDLDIRECT in the last 72 hours. Thyroid Function Tests: No results for input(s): TSH, T4TOTAL, FREET4, T3FREE, THYROIDAB in the last 72 hours. Anemia Panel: No results for input(s): VITAMINB12, FOLATE, FERRITIN, TIBC, IRON, RETICCTPCT in the last 72 hours. Urine analysis: No results found for: COLORURINE, APPEARANCEUR, LABSPEC, PHURINE, GLUCOSEU,  HGBUR, BILIRUBINUR, KETONESUR, PROTEINUR, UROBILINOGEN, NITRITE, LEUKOCYTESUR  Radiological Exams on Admission: CT Head Wo Contrast  Result Date: 03/13/2019 CLINICAL DATA:  Headache EXAM: CT HEAD WITHOUT CONTRAST TECHNIQUE: Contiguous axial images were obtained from the base of the skull through the vertex without intravenous contrast. COMPARISON:  03/09/2019 FINDINGS: Brain: No evidence of acute infarction, hemorrhage, hydrocephalus, extra-axial collection or mass lesion/mass effect. 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 Additional areas of encephalomalacia in the left frontal and parietal lobes are stable from prior. Scattered low-density changes within the periventricular and subcortical white matter compatible with chronic microvascular ischemic change. Mild diffuse cerebral volume loss. Vascular: Mild atherosclerotic calcifications involving the large vessels of the skull base. No unexpected hyperdense vessel. Skull: Normal. Negative for fracture or focal lesion. Sinuses/Orbits: Sinonasal polyps versus mucous retention cysts in the right maxillary and left sphenoid sinuses. Paranasal sinuses are otherwise clear. Orbital structures unremarkable. Other: None. IMPRESSION: No acute intracranial findings.  Stable exam from 03/09/2019. Electronically Signed   By: Davina Poke D.O.   On: 03/13/2019 11:42   DG Chest Portable 1 View  Result Date: 03/13/2019 CLINICAL DATA:  [place], right rib pain,  hypertension, diabetes EXAM: PORTABLE CHEST 1 VIEW COMPARISON:  03/09/2019 FINDINGS: Single frontal view of the chest demonstrates a stable cardiac silhouette. No airspace disease, effusion, or pneumothorax. Chronic right posterior seventh rib fracture. IMPRESSION: No active disease. Electronically Signed   By: Randa Ngo M.D.   On: 03/13/2019 10:59   CT Angio Chest/Abd/Pel for Dissection W and/or Wo Contrast  Result Date: 03/13/2019 CLINICAL DATA:  Abdominal pain, remote fall EXAM: CT ANGIOGRAPHY CHEST, ABDOMEN AND PELVIS TECHNIQUE: Multidetector CT imaging through the chest, abdomen and pelvis was performed using the standard protocol during bolus administration of intravenous contrast. Multiplanar reconstructed images and MIPs were obtained and reviewed to evaluate the vascular anatomy. CONTRAST:  124mL OMNIPAQUE IOHEXOL 350 MG/ML SOLN COMPARISON:  None. FINDINGS: CTA CHEST FINDINGS Cardiovascular: Preferential opacification of the thoracic aorta. No evidence of thoracic aortic aneurysm or dissection. Normal heart size. No pericardial effusion. Thoracic aortic atherosclerosis. Multi vessel coronary artery atherosclerosis. Mediastinum/Nodes: No enlarged mediastinal, hilar, or axillary lymph nodes. Thyroid gland, trachea, and esophagus demonstrate no significant findings. Lungs/Pleura: Small bilateral pleural effusions, right greater than left. Right lower lobe patchy airspace disease. No pneumothorax. Musculoskeletal: No chest wall abnormality. No acute or significant osseous findings. Review of the MIP images confirms the above findings. CTA ABDOMEN AND PELVIS FINDINGS VASCULAR Aorta: Normal caliber aorta without aneurysm, dissection, vasculitis or significant stenosis. Abdominal aortic atherosclerosis. Celiac: Patent without evidence of aneurysm, dissection, vasculitis or significant stenosis. SMA: Patent without evidence of aneurysm, dissection, vasculitis or significant stenosis. Renals: Both renal  arteries are patent without evidence of aneurysm, dissection, vasculitis, fibromuscular dysplasia or significant stenosis. IMA: Patent without evidence of aneurysm, dissection, vasculitis or significant stenosis. Inflow: Patent without evidence of aneurysm, dissection, vasculitis or significant stenosis. Veins: No obvious venous abnormality within the limitations of this arterial phase study. Review of the MIP images confirms the above findings. NON-VASCULAR Hepatobiliary: No focal liver abnormality is seen. No gallstones, gallbladder wall thickening, or biliary dilatation. Pancreas: Unremarkable. No pancreatic ductal dilatation or surrounding inflammatory changes. Spleen: Normal in size without focal abnormality. Adrenals/Urinary Tract: Adrenal glands are unremarkable. Kidneys are normal, without renal calculi, focal lesion, or hydronephrosis. Bladder is unremarkable. Stomach/Bowel: Stomach is within normal limits. Appendix appears normal. No evidence of bowel wall thickening, distention, or inflammatory changes. Lymphatic: No lymphadenopathy. Reproductive: Prostate is unremarkable. Other: Small  amount of abdominal and pelvic free fluid. Mild anasarca. Musculoskeletal: No acute osseous abnormality. No aggressive osseous lesion. Review of the MIP images confirms the above findings. IMPRESSION: 1. No evidence of an aortic dissection.  No aortic aneurysm. 2.  Aortic Atherosclerosis (ICD10-I70.0). 3. Right lower lobe pneumonia. 4. Small amount of pelvic free fluid. Electronically Signed   By: Elige Ko   On: 03/13/2019 11:55    EKG: Independently reviewed.  ECG shows sinus rhythm, tachycardia at a rate of 106/min with what appears to be Q waves inferolaterally.  Assessment/Plan Active Problems:   Sepsis (HCC)     1.  Sepsis likely secondary to right lower lobe pneumonia.  This is based on abnormal CT scan findings of the chest together with increased lactic acid levels and leukocytosis as well as poor  vascular perfusion and hypotension.  Treat with intravenous antibiotics.  Blood cultures have been sent. 2.  Peripheral cyanosis.  This may be secondary to sepsis.  There are no obvious abnormalities on CT angiogram of the chest, abdomen and pelvis indicative of any major vascular abnormalities.  Hopefully, with increased volume repletion, his peripheral cyanosis will improve.  This will need to be closely monitored. 3.  Increased liver enzymes with increased INR indicative of possible hepatitis and hepatic failure.  The etiology of this is not entirely clear to me.  I will check a hepatitis panel.  Continue to follow liver enzymes and INR.  We will need to follow this closely and I will see gastroenterology consultation. 4.  Elevated troponin with abnormal electrocardiogram.  I suspect the electrocardiogram changes are old.  His troponin has increased since 4 days ago but he is not really having any cardiac symptoms at the present time.  I will seek cardiology consultation.   DVT prophylaxis: SCDs. Code Status: Full code.  Disposition Plan: I suspect he will been in the hospital for several days probably 4 to 5 days depending on his progress.  I am concerned about his sepsis and peripheral vascular perfusion as well as significantly elevated liver enzymes.  I am concerned that his INR is elevated, possibly indicating hepatic failure.  He should be able to be discharged home depending on progress of his medical conditions. Consults called: Cardiology and gastroenterology. Admission status: Inpatient stepdown unit  Wilson Singer MD [facility]   If 7PM-7AM, please contact night-coverage [email or web address]   03/13/2019, 1:44 PM

## 2019-03-13 NOTE — ED Notes (Signed)
Date and time results received: 03/13/19 11:26 AM  (use smartphrase ".now" to insert current time)  Test: Lactic Acid Critical Value: 3.6  Name of Provider Notified: Clydie Braun PA  Orders Received? Or Actions Taken?: Orders Received - See Orders for details

## 2019-03-13 NOTE — ED Provider Notes (Signed)
[facility] EMERGENCY DEPARTMENT Provider Note   CSN: [id number] Arrival date & time: 03/13/19  0901     History Chief Complaint  Patient presents with  . Fall    Matthew Mccormick is a 64 y.o. male.  Pt reports he fell last pm.  Pt reports he was trying to go the bathroom and fell between his bed and dresser.  Pt reports he could not get up.  Pt complains of pain in his right chest  Pt is a limited historian.  Pt has discolored feet and toes.  Pt can not tell me if he has vascular disease.   The history is provided by the patient. The history is limited by a language barrier. No language interpreter was used.  Fall This is a new problem. The current episode started 6 to 12 hours ago. The problem occurs constantly. The problem has not changed since onset.Nothing aggravates the symptoms. Nothing relieves the symptoms. He has tried nothing for the symptoms.       Past Medical History:  Diagnosis Date  . 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 Chronic kidney disease   . Diabetes mellitus without complication (HCC)   . Hyperlipidemia   . Hypertension   . Slow rate of speech   . Stroke [facility])    1 year ago-left and shaky and unsteady on feet.    Patient Active Problem List   Diagnosis Date Noted  . Diabetes mellitus (HCC) 07/26/2016  . Hypertension 07/26/2016  . Coronary artery disease 07/26/2016  . History of CVA (cerebrovascular accident) 07/26/2016  . Atrial fibrillation (HCC) 07/26/2016  . Dyslipidemia 07/26/2016  . Former cigarette smoker 07/26/2016  . Osteomyelitis of toe of left foot (HCC) 07/26/2016    Past Surgical History:  Procedure Laterality Date  . CATARACT EXTRACTION W/PHACO Right 06/18/2013   Procedure: CATARACT EXTRACTION PHACO AND INTRAOCULAR LENS PLACEMENT (IOC);  Surgeon: Gemma Payor, MD;  Location: [facility];  Service: Ophthalmology;  Laterality: Right;  CDE 34.55  . ORIF ANKLE DISLOCATION Right        No family history on file.  Social  History   Tobacco Use  . Smoking status: Former Smoker    Packs/day: 1.00    Years: 1.00    Pack years: 1.00    Types: Cigarettes    Quit date: 06/16/1991    Years since quitting: 27.7  . Smokeless tobacco: Never Used  Substance Use Topics  . Alcohol use: No  . Drug use: No    Home Medications Prior to Admission medications   Medication Sig Start Date End Date Taking? Authorizing Provider  amLODipine (NORVASC) 10 MG tablet Take 10 mg by mouth daily.    [provider]  aspirin EC 81 MG tablet Take 81 mg by mouth daily.    [provider]  atenolol (TENORMIN) 100 MG tablet Take 100 mg by mouth daily.    [provider]  atorvastatin (LIPITOR) 40 MG tablet Take 40 mg by mouth daily.    [provider]  carvedilol (COREG) 3.125 MG tablet Take 3.125 mg by mouth 2 (two) times daily with a meal.  03/12/16 11/07/17  [provider]  cephALEXin (KEFLEX) 500 MG capsule Take 1 capsule (500 mg total) by mouth 4 (four) times daily. 03/09/19   Geoffery Lyons, MD  clopidogrel (PLAVIX) 75 MG tablet Take 75 mg by mouth daily.     [provider]  empagliflozin (JARDIANCE) 10 MG TABS tablet  Take 10 mg by mouth daily.     [provider]  insulin detemir (LEVEMIR) 100 UNIT/ML injection Inject 40 Units into the skin daily.    [provider]  lisinopril (PRINIVIL,ZESTRIL) 10 MG tablet Take 10 mg by mouth daily.    [provider]  metFORMIN (GLUCOPHAGE) 1000 MG tablet Take 1,000 mg by mouth 2 (two) times daily with a meal.    [provider]  potassium chloride (KLOR-CON) 10 MEQ tablet Take 10 mEq by mouth daily. 01/17/19   [provider]    Allergies    Patient has no known allergies.  Review of Systems   Review of Systems  Unable to perform ROS: Mental status change  All other systems reviewed and are negative.   Physical Exam Updated Vital Signs BP 96/82   Pulse 96   Temp 97.7 F (36.5 C)  (Rectal)   Resp (!) 22   Wt 90.7 kg   SpO2 100%   BMI 30.41 kg/m   Physical Exam Vitals and nursing note reviewed.  Constitutional:      Appearance: He is well-developed.  HENT:     Head: Normocephalic and atraumatic.  Eyes:     Conjunctiva/sclera: Conjunctivae normal.  Cardiovascular:     Rate and Rhythm: Normal rate and regular rhythm.     Heart sounds: No murmur.     Comments: Tender right chest wall  Pulmonary:     Effort: Pulmonary effort is normal. No respiratory distress.     Breath sounds: Normal breath sounds.  Abdominal:     Palpations: Abdomen is soft.     Tenderness: There is no abdominal tenderness.  Musculoskeletal:     Cervical back: Neck supple.     Comments: Lower legs discolored,  Unable to doppler pulses   Skin:    General: Skin is warm and dry.  Neurological:     Mental Status: He is alert.     ED Results / Procedures / Treatments   Labs (all labs ordered are listed, but only abnormal results are displayed) Labs Reviewed  CBC WITH DIFFERENTIAL/PLATELET - Abnormal; Notable for the following components:      Result Value   WBC 16.3 (*)    Neutro Abs 13.0 (*)    Monocytes Absolute 2.0 (*)    Abs Immature Granulocytes 0.09 (*)    All other components within normal limits  COMPREHENSIVE METABOLIC PANEL - Abnormal; Notable for the following components:   Sodium 128 (*)    Chloride 90 (*)    BUN 37 (*)    AST 1,189 (*)    ALT 939 (*)    Alkaline Phosphatase 329 (*)    Total Bilirubin 2.7 (*)    All other components within normal limits  LACTIC ACID, PLASMA - Abnormal; Notable for the following components:   Lactic Acid, Venous 3.6 (*)    All other components within normal limits  PROTIME-INR - Abnormal; Notable for the following components:   Prothrombin Time 19.0 (*)    INR 1.6 (*)    All other components within normal limits  TROPONIN I (HIGH SENSITIVITY) - Abnormal; Notable for the following components:   Troponin I (High Sensitivity) 109  (*)    All other components within normal limits  RESPIRATORY PANEL BY RT PCR (FLU A&B, COVID)  CULTURE, BLOOD (ROUTINE X 2)  CULTURE, BLOOD (ROUTINE X 2)  URINE CULTURE  CK  APTT  URINALYSIS, ROUTINE W REFLEX MICROSCOPIC  LACTIC ACID, PLASMA  TROPONIN I (HIGH SENSITIVITY)    EKG EKG Interpretation  Date/Time:  Tuesday March 13 2019 09:31:07 EST Ventricular Rate:  106 PR Interval:    QRS Duration: 128 QT Interval:  355 QTC Calculation: 472 R Axis:   -68 Text Interpretation: Sinus tachycardia Nonspecific IVCD with LAD Inferolateral infarct, age indeterminate Probable anteroseptal infarct, recent Confirmed by Bethann Berkshire [phone]) on 03/13/2019 10:01:21 AM Also confirmed by Bethann Berkshire [phone])  on 03/13/2019 10:16:35 AM   Radiology CT Head Wo Contrast  Result Date: 03/13/2019 CLINICAL DATA:  Headache EXAM: CT HEAD WITHOUT CONTRAST TECHNIQUE: Contiguous axial images were obtained from the base of the skull through the vertex without intravenous contrast. COMPARISON:  03/09/2019 FINDINGS: Brain: No evidence of acute infarction, hemorrhage, hydrocephalus, extra-axial collection or mass lesion/mass effect. 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 Additional areas of encephalomalacia in the left frontal and parietal lobes are stable from prior. Scattered low-density changes within the periventricular and subcortical white matter compatible with chronic microvascular ischemic change. Mild diffuse cerebral volume loss. Vascular: Mild atherosclerotic calcifications involving the large vessels of the skull base. No unexpected hyperdense vessel. Skull: Normal. Negative for fracture or focal lesion. Sinuses/Orbits: Sinonasal polyps versus mucous retention cysts in the right maxillary and left sphenoid sinuses. Paranasal sinuses are otherwise clear. Orbital structures unremarkable. Other: None. IMPRESSION: No acute intracranial findings.  Stable exam from 03/09/2019.  Electronically Signed   By: Duanne Guess D.O.   On: 03/13/2019 11:42   DG Chest Portable 1 View  Result Date: 03/13/2019 CLINICAL DATA:  Larey Seat, right rib pain, hypertension, diabetes EXAM: PORTABLE CHEST 1 VIEW COMPARISON:  03/09/2019 FINDINGS: Single frontal view of the chest demonstrates a stable cardiac silhouette. No airspace disease, effusion, or pneumothorax. Chronic right posterior seventh rib fracture. IMPRESSION: No active disease. Electronically Signed   By: Sharlet Salina M.D.   On: 03/13/2019 10:59   CT Angio Chest/Abd/Pel for Dissection W and/or Wo Contrast  Result Date: 03/13/2019 CLINICAL DATA:  Abdominal pain, remote fall EXAM: CT ANGIOGRAPHY CHEST, ABDOMEN AND PELVIS TECHNIQUE: Multidetector CT imaging through the chest, abdomen and pelvis was performed using the standard protocol during bolus administration of intravenous contrast. Multiplanar reconstructed images and MIPs were obtained and reviewed to evaluate the vascular anatomy. CONTRAST:  OMNIPAQUE IOHEXOL 350 MG/ML SOLN COMPARISON:  None. FINDINGS: CTA CHEST FINDINGS Cardiovascular: Preferential opacification of the thoracic aorta. No evidence of thoracic aortic aneurysm or dissection. Normal heart size. No pericardial effusion. Thoracic aortic atherosclerosis. Multi vessel coronary artery atherosclerosis. Mediastinum/Nodes: No enlarged mediastinal, hilar, or axillary lymph nodes. Thyroid gland, trachea, and esophagus demonstrate no significant findings. Lungs/Pleura: Small bilateral pleural effusions, right greater than left. Right lower lobe patchy airspace disease. No pneumothorax. Musculoskeletal: No chest wall abnormality. No acute or significant osseous findings. Review of the MIP images confirms the above findings. CTA ABDOMEN AND PELVIS FINDINGS VASCULAR Aorta: Normal caliber aorta without aneurysm, dissection, vasculitis or significant stenosis. Abdominal aortic atherosclerosis. Celiac: Patent without evidence of  aneurysm, dissection, vasculitis or significant stenosis. SMA: Patent without evidence of aneurysm, dissection, vasculitis or significant stenosis. Renals: Both renal arteries are patent without evidence of aneurysm, dissection, vasculitis, fibromuscular dysplasia or significant stenosis. IMA: Patent without evidence of aneurysm, dissection, vasculitis or significant stenosis. Inflow: Patent without evidence of aneurysm, dissection, vasculitis or significant stenosis. Veins: No obvious venous abnormality within the limitations of this arterial phase study. Review of the MIP images confirms the above findings. NON-VASCULAR Hepatobiliary: No focal liver abnormality is seen. No gallstones,  gallbladder wall thickening, or biliary dilatation. Pancreas: Unremarkable. No pancreatic ductal dilatation or surrounding inflammatory changes. Spleen: Normal in size without focal abnormality. Adrenals/Urinary Tract: Adrenal glands are unremarkable. Kidneys are normal, without renal calculi, focal lesion, or hydronephrosis. Bladder is unremarkable. Stomach/Bowel: Stomach is within normal limits. Appendix appears normal. No evidence of bowel wall thickening, distention, or inflammatory changes. Lymphatic: No lymphadenopathy. Reproductive: Prostate is unremarkable. Other: Small amount of abdominal and pelvic free fluid. Mild anasarca. Musculoskeletal: No acute osseous abnormality. No aggressive osseous lesion. Review of the MIP images confirms the above findings. IMPRESSION: 1. No evidence of an aortic dissection.  No aortic aneurysm. 2.  Aortic Atherosclerosis (ICD10-I70.0). 3. Right lower lobe pneumonia. 4. Small amount of pelvic free fluid. Electronically Signed   By: Kathreen Devoid   On: 03/13/2019 11:55    Procedures .Critical Care Performed by: Fransico Meadow, PA-C Authorized by: Fransico Meadow, PA-C   Critical care provider statement:    Critical care time (minutes):  60   Critical care start time:  03/13/2019 9:30  AM   Critical care end time:  03/13/2019 12:42 PM   Critical care time was exclusive of:  Separately billable procedures and treating other patients   Critical care was necessary to treat or prevent imminent or life-threatening deterioration of the following conditions:  Dehydration, cardiac failure, renal failure, circulatory failure and respiratory failure   Critical care was time spent personally by me on the following activities:  Blood draw for specimens, development of treatment plan with patient or surrogate, discussions with consultants, discussions with primary provider, evaluation of patient's response to treatment, interpretation of cardiac output measurements, obtaining history from patient or surrogate, ordering and performing treatments and interventions, ordering and review of laboratory studies, re-evaluation of patient's condition and review of old charts   I assumed direction of critical care for this patient from another provider in my specialty: no     (including critical care time)  Medications Ordered in ED Medications  vancomycin (VANCOREADY) IVPB 2000 mg/400 mL (2,000 mg Intravenous New Bag/Given 03/13/19 1143)  ceFEPIme (MAXIPIME) 2 g in sodium chloride 0.9 % 100 mL IVPB (has no administration in time range)  vancomycin (VANCOREADY) IVPB 1250 mg/250 mL (has no administration in time range)  sodium chloride 0.9 % bolus 500 mL (500 mLs Intravenous New Bag/Given 03/13/19 1037)  sodium chloride 0.9 % bolus 1,000 mL (1,000 mLs Intravenous New Bag/Given 03/13/19 1037)    And  sodium chloride 0.9 % bolus 1,000 mL (1,000 mLs Intravenous New Bag/Given 03/13/19 1024)    And  sodium chloride 0.9 % bolus 1,000 mL (1,000 mLs Intravenous New Bag/Given 03/13/19 1022)  ceFEPIme (MAXIPIME) 2 g in sodium chloride 0.9 % 100 mL IVPB (0 g Intravenous Stopped 03/13/19 1103)  metroNIDAZOLE (FLAGYL) IVPB 500 mg (0 mg Intravenous Stopped 03/13/19 1141)  iohexol (OMNIPAQUE) 350 MG/ML injection 100 mL (100 mLs  Intravenous Contrast Given 03/13/19 1132)    ED Course  I have reviewed the triage vital signs and the nursing notes.  Pertinent labs & imaging results that were available during my care of the patient were reviewed by me and considered in my medical decision making (see chart for details).    MDM Rules/Calculators/A&P                      MDM Pt is a poor historian,  I suspect sepsis.  Labs obtained.  Ct chest and abdomen due to discoloration to ruleout aorta  issue.  Pt given Iv fluids 30mg  per Kilogram.  IV antibiotics ordered per sepsis protocol   Ct head shows old infarct, no acute.  Ct abdomen no acute.  Chest shows left sided pneumonia  Final Clinical Impression(s) / ED Diagnoses Final diagnoses:  Sepsis, due to unspecified organism, unspecified whether acute organ dysfunction present [facility])  Pneumonia of right lung due to infectious organism, unspecified part of lung    Rx / DC Orders ED Discharge Orders    None    Consult to Hospitalitis to see   [facility], PA-C 03/13/19 1247    05/11/19, MD 03/14/19 [phone]

## 2019-03-13 NOTE — Consult Note (Addendum)
Referring Provider: No ref. provider found Primary Care Physician:  Monico Blitz, MD Primary Gastroenterologist:  Dr. Oneida Alar (previously unassigned)  Date of Admission: 03/13/19 Date of Consultation: 03/13/19  Reason for Consultation:  Hepatitis  HPI:  Matthew Mccormick is a 64 y.o. year old male with history of diabetes, HTN, HLD, prior CVA with left hemiparesis, and chronic kidney disease who presented to the emergency room today after having a fall at home.  Stated he was trying to go to the bathroom and fell between his bed and dresser and was unable to get up.  Notably, he was seen in the ER a few days ago with weakness.  Noted purple discoloration of his feet at that time but pulses dopplerable.  He was stable enough to be discharged.  ED course: Saw blood pressure 96/82.  Covid test was negative.  CT head without evidence of acute abnormalities.  Stable chronic findings.  CT angio chest/abdomen/pelvis with right lower lobe pneumonia and a small amount of free pelvic fluid with mild anasarca.  No other significant findings.  Liver without focal abnormality, no gallstones, gallbladder wall thickening, or biliary dilation.  Labs significantly abnormal with AST 1189 (H), ALT 939 (H), alk phos 329 (H), total bilirubin 2.7 (H), INR 1.6 (H).  WBC 16.3 (H).  Sodium 128 (L).  Troponin 109 (H).  Lactic acid 3.6 (H).  Patient received several IV fluid boluses and started on broad-spectrum antibiotics for sepsis likely secondary to pneumonia.  Acute hepatitis panel ordered.  GI and cardiology consulted.  Today: Feel between mattress and side table. Was trying to get in the bed. Couldn't get up.  Cannot tell me the time that he fell.  Paramedics came to help him. States he was lightheaded at the time he fell. Denies pre-syncope. States he gets lightheaded from time to time when changing positions. States he looses his balance and falls. Uses a walker and cain at home. Lives alone. Has an aid that comes in  daily to help with daily activities.  Reports weakness started worsening a few days ago.  Denies any history of alcohol or drug use. No tattoos. States he uses Tylenol sometimes but has not used any recently.  No known personal history of liver disease.  No family history of liver disease.  Denies abdominal pain, nausea, vomiting, GERD symptoms, constipation, diarrhea, bright red blood per rectum, or melena.   Of note, patient is somewhat of a difficult historian.  He will stop midsentence without completely answering.  Starting to fall asleep during our encounter today.    Denies fever, chills, chest pain, heart palpitations.  Admits to shortness of breath and a cough productive of phlegm when he was at home.  Past Medical History:  Diagnosis Date  . 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 Chronic kidney disease   . Diabetes mellitus without complication ([place])   . Hyperlipidemia   . Hypertension   . Slow rate of speech   . Stroke [facility])    1 year ago-left and shaky and unsteady on feet.    Past Surgical History:  Procedure Laterality Date  . CATARACT EXTRACTION W/PHACO Right 06/18/2013   Procedure: CATARACT EXTRACTION PHACO AND INTRAOCULAR LENS PLACEMENT (IOC);  Surgeon: Tonny Branch, MD;  Location: [facility];  Service: Ophthalmology;  Laterality: Right;  CDE 34.55  . ORIF ANKLE DISLOCATION Right     Prior to Admission medications   Medication Sig Start Date End Date Taking? Authorizing Provider  amLODipine (NORVASC) 10 MG tablet Take 10 mg by mouth daily.    [provider]  aspirin EC 81 MG tablet Take 81 mg by mouth daily.    [provider]  atenolol (TENORMIN) 100 MG tablet Take 100 mg by mouth daily.    [provider]  atorvastatin (LIPITOR) 40 MG tablet Take 40 mg by mouth daily.    [provider]  carvedilol (COREG) 3.125 MG tablet Take 3.125 mg by mouth 2 (two) times daily with a meal.  03/12/16 11/07/17  [provider]   cephALEXin (KEFLEX) 500 MG capsule Take 1 capsule (500 mg total) by mouth 4 (four) times daily. 03/09/19   Veryl Speak, MD  clopidogrel (PLAVIX) 75 MG tablet Take 75 mg by mouth daily.     [provider]  empagliflozin (JARDIANCE) 10 MG TABS tablet Take 10 mg by mouth daily.     [provider]  insulin detemir (LEVEMIR) 100 UNIT/ML injection Inject 40 Units into the skin daily.    [provider]  lisinopril (PRINIVIL,ZESTRIL) 10 MG tablet Take 10 mg by mouth daily.    [provider]  metFORMIN (GLUCOPHAGE) 1000 MG tablet Take 1,000 mg by mouth 2 (two) times daily with a meal.    [provider]  potassium chloride (KLOR-CON) 10 MEQ tablet Take 10 mEq by mouth daily. 01/17/19   [provider]    Current Facility-Administered Medications  Medication Dose Route Frequency Provider Last Rate Last Admin  . 0.9 %  sodium chloride infusion   Intravenous Continuous Jodi Mourning, Danilo Cappiello S, PA-C      . ceFEPIme (MAXIPIME) 2 g in sodium chloride 0.9 % 100 mL IVPB  2 g Intravenous Q8H Milton Ferguson, MD      . Derrill Memo ON 03/14/2019] vancomycin (VANCOREADY) IVPB 1250 mg/250 mL  1,250 mg Intravenous Q12H Milton Ferguson, MD       Current Outpatient Medications  Medication Sig Dispense Refill  . amLODipine (NORVASC) 10 MG tablet Take 10 mg by mouth daily.    Marland Kitchen aspirin EC 81 MG tablet Take 81 mg by mouth daily.    Marland Kitchen atenolol (TENORMIN) 100 MG tablet Take 100 mg by mouth daily.    Marland Kitchen atorvastatin (LIPITOR) 40 MG tablet Take 40 mg by mouth daily.    . carvedilol (COREG) 3.125 MG tablet Take 3.125 mg by mouth 2 (two) times daily with a meal.     . cephALEXin (KEFLEX) 500 MG capsule Take 1 capsule (500 mg total) by mouth 4 (four) times daily. 28 capsule 0  . clopidogrel (PLAVIX) 75 MG tablet Take 75 mg by mouth daily.     . empagliflozin (JARDIANCE) 10 MG TABS tablet Take 10 mg by mouth daily.     . insulin detemir (LEVEMIR) 100 UNIT/ML injection Inject 40 Units  into the skin daily.    Marland Kitchen lisinopril (PRINIVIL,ZESTRIL) 10 MG tablet Take 10 mg by mouth daily.    . metFORMIN (GLUCOPHAGE) 1000 MG tablet Take 1,000 mg by mouth 2 (two) times daily with a meal.    . potassium chloride (KLOR-CON) 10 MEQ tablet Take 10 mEq by mouth daily.      Allergies as of 03/13/2019  . (No Known Allergies)    Family History  Problem Relation Age of Onset  . Liver disease Neg Hx     Social History   Socioeconomic History  . Marital status: Single    Spouse name: Not on file  . Number of children:  Not on file  . Years of education: Not on file  . Highest education level: Not on file  Occupational History  . Not on file  Tobacco Use  . Smoking status: Former Smoker    Packs/day: 1.00    Years: 1.00    Pack years: 1.00    Types: Cigarettes    Quit date: 06/16/1991    Years since quitting: 27.7  . Smokeless tobacco: Never Used  Substance and Sexual Activity  . Alcohol use: No  . Drug use: No  . Sexual activity: Yes    Birth control/protection: None  Other Topics Concern  . Not on file  Social History Narrative   Pt lives alone in 1 story home   8th grade education   Has home health aid 6days/week   Social Determinants of Health   Financial Resource Strain:   . Difficulty of Paying Living Expenses: Not on file  Food Insecurity:   . Worried About Charity fundraiser in the Last Year: Not on file  . Ran Out of Food in the Last Year: Not on file  Transportation Needs:   . Lack of Transportation (Medical): Not on file  . Lack of Transportation (Non-Medical): Not on file  Physical Activity:   . Days of Exercise per Week: Not on file  . Minutes of Exercise per Session: Not on file  Stress:   . Feeling of Stress : Not on file  Social Connections:   . Frequency of Communication with Friends and Family: Not on file  . Frequency of Social Gatherings with Friends and Family: Not on file  . Attends Religious Services: Not on file  . Active Member of  Clubs or Organizations: Not on file  . Attends Archivist Meetings: Not on file  . Marital Status: Not on file  Intimate Partner Violence:   . Fear of Current or Ex-Partner: Not on file  . Emotionally Abused: Not on file  . Physically Abused: Not on file  . Sexually Abused: Not on file    Review of Systems: Gen: See HPI CV: See HPI Resp: See HPI GI: See HPI MS: Occasional joint pain for which he takes Tylenol.  Unable to tell me how frequently or where exactly he has pain. Derm: Denies rash. Psych: Denies depression, anxiety. Heme: See HPI  Physical Exam: Vital signs in last 24 hours: Temp:  [97.6 F (36.4 C)-97.7 F (36.5 C)] 97.7 F (36.5 C) (02/02 0928) Pulse Rate:  [82-106] 100 (02/02 1515) Resp:  [14-24] 14 (02/02 1330) BP: (96-112)/(67-84) 107/79 (02/02 1500) SpO2:  [100 %] 100 % (02/02 1515) Weight:  [90.7 kg] 90.7 kg (02/02 0906)   General:  Appears ill. Alert but falling asleep as the encounter progresses. Taking deep breaths intermittently throughout the encounter. Oxygen saturations ranging from 80-100%, although this could be secondary to cold extremities.  Head:  Normocephalic and atraumatic. Eyes:  Mild scleral icterus.   Ears:  Normal auditory acuity. Nose:  No deformity, discharge,  or lesions. Mouth:  No deformity or lesions,. Poor dentition.  Lungs:  Clear throughout to auscultation.   No wheezes, crackles, or rhonchi.  Taking deep breaths intermittently throughout the encounter but no acute distress. Heart:  Regular rate and rhythm Abdomen:  Soft, nontender and nondistended. No masses, hepatosplenomegaly or hernias noted. Normal bowel sounds, without guarding, and without rebound.   Rectal:  Deferred   Msk:  Symmetrical without gross deformities. Extremities: 2+ bilateral lower extremity pitting edema up to  his knees.  Lower extremities are purple in color up to mid lower leg and cold.  Hands are also cold.  Has a couple of blisters on his right  lower extremity and what was likely a blister with the skin now off on his right lower leg. Neurologic:  Alert and  oriented x4. Psych:  Normal mood and affect.  Intake/Output from previous day: No intake/output data recorded. Intake/Output this shift: Total I/O In: 4100 [IV Piggyback:4100] Out: -   Lab Results: Recent Labs    03/13/19 1056  WBC 16.3*  HGB 14.8  HCT 45.7  PLT 300   BMET Recent Labs    03/13/19 1056  NA 128*  K 4.5  CL 90*  CO2 24  GLUCOSE 82  BUN 37*  CREATININE 1.17  CALCIUM 9.3   LFT Recent Labs    03/13/19 1056  PROT 7.4  ALBUMIN 4.0  AST 1,189*  ALT 939*  ALKPHOS 329*  BILITOT 2.7*   PT/INR Recent Labs    03/13/19 1056  LABPROT 19.0*  INR 1.6*   Studies/Results: CT Head Wo Contrast  Result Date: 03/13/2019 CLINICAL DATA:  Headache EXAM: CT HEAD WITHOUT CONTRAST TECHNIQUE: Contiguous axial images were obtained from the base of the skull through the vertex without intravenous contrast. COMPARISON:  03/09/2019 FINDINGS: Brain: No evidence of acute infarction, hemorrhage, hydrocephalus, extra-axial collection or mass lesion/mass effect. 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 Additional areas of encephalomalacia in the left frontal and parietal lobes are stable from prior. Scattered low-density changes within the periventricular and subcortical white matter compatible with chronic microvascular ischemic change. Mild diffuse cerebral volume loss. Vascular: Mild atherosclerotic calcifications involving the large vessels of the skull base. No unexpected hyperdense vessel. Skull: Normal. Negative for fracture or focal lesion. Sinuses/Orbits: Sinonasal polyps versus mucous retention cysts in the right maxillary and left sphenoid sinuses. Paranasal sinuses are otherwise clear. Orbital structures unremarkable. Other: None. IMPRESSION: No acute intracranial findings.  Stable exam from 03/09/2019. Electronically Signed   By: Davina Poke D.O.   On: 03/13/2019 11:42   DG Chest Portable 1 View  Result Date: 03/13/2019 CLINICAL DATA:  [place], right rib pain, hypertension, diabetes EXAM: PORTABLE CHEST 1 VIEW COMPARISON:  03/09/2019 FINDINGS: Single frontal view of the chest demonstrates a stable cardiac silhouette. No airspace disease, effusion, or pneumothorax. Chronic right posterior seventh rib fracture. IMPRESSION: No active disease. Electronically Signed   By: Randa Ngo M.D.   On: 03/13/2019 10:59   CT Angio Chest/Abd/Pel for Dissection W and/or Wo Contrast  Result Date: 03/13/2019 CLINICAL DATA:  Abdominal pain, remote fall EXAM: CT ANGIOGRAPHY CHEST, ABDOMEN AND PELVIS TECHNIQUE: Multidetector CT imaging through the chest, abdomen and pelvis was performed using the standard protocol during bolus administration of intravenous contrast. Multiplanar reconstructed images and MIPs were obtained and reviewed to evaluate the vascular anatomy. CONTRAST:  118m OMNIPAQUE IOHEXOL 350 MG/ML SOLN COMPARISON:  None. FINDINGS: CTA CHEST FINDINGS Cardiovascular: Preferential opacification of the thoracic aorta. No evidence of thoracic aortic aneurysm or dissection. Normal heart size. No pericardial effusion. Thoracic aortic atherosclerosis. Multi vessel coronary artery atherosclerosis. Mediastinum/Nodes: No enlarged mediastinal, hilar, or axillary lymph nodes. Thyroid gland, trachea, and esophagus demonstrate no significant findings. Lungs/Pleura: Small bilateral pleural effusions, right greater than left. Right lower lobe patchy airspace disease. No pneumothorax. Musculoskeletal: No chest wall abnormality. No acute or significant osseous findings. Review of the MIP images confirms the above findings. CTA ABDOMEN AND PELVIS FINDINGS VASCULAR Aorta: Normal caliber aorta  without aneurysm, dissection, vasculitis or significant stenosis. Abdominal aortic atherosclerosis. Celiac: Patent without evidence of aneurysm, dissection, vasculitis or  significant stenosis. SMA: Patent without evidence of aneurysm, dissection, vasculitis or significant stenosis. Renals: Both renal arteries are patent without evidence of aneurysm, dissection, vasculitis, fibromuscular dysplasia or significant stenosis. IMA: Patent without evidence of aneurysm, dissection, vasculitis or significant stenosis. Inflow: Patent without evidence of aneurysm, dissection, vasculitis or significant stenosis. Veins: No obvious venous abnormality within the limitations of this arterial phase study. Review of the MIP images confirms the above findings. NON-VASCULAR Hepatobiliary: No focal liver abnormality is seen. No gallstones, gallbladder wall thickening, or biliary dilatation. Pancreas: Unremarkable. No pancreatic ductal dilatation or surrounding inflammatory changes. Spleen: Normal in size without focal abnormality. Adrenals/Urinary Tract: Adrenal glands are unremarkable. Kidneys are normal, without renal calculi, focal lesion, or hydronephrosis. Bladder is unremarkable. Stomach/Bowel: Stomach is within normal limits. Appendix appears normal. No evidence of bowel wall thickening, distention, or inflammatory changes. Lymphatic: No lymphadenopathy. Reproductive: Prostate is unremarkable. Other: Small amount of abdominal and pelvic free fluid. Mild anasarca. Musculoskeletal: No acute osseous abnormality. No aggressive osseous lesion. Review of the MIP images confirms the above findings. IMPRESSION: 1. No evidence of an aortic dissection.  No aortic aneurysm. 2.  Aortic Atherosclerosis (ICD10-I70.0). 3. Right lower lobe pneumonia. 4. Small amount of pelvic free fluid. Electronically Signed   By: Kathreen Devoid   On: 03/13/2019 11:55    Impression: 64 year old male with history of diabetes, HTN, HLD, prior CVA with left hemiparesis, and chronic kidney disease who presented to the emergency room today after having a fall at home.  In the ED, blood pressure was soft at 96/82. He was found to  have sepsis likely secondary to right lower lobe pneumonia with elevated white count and lactic acid.  Notably, all extremities are very cold and lower extremities are purplish in color up to mid lower leg.  Covid test was negative.  Additionally, LFTs markedly elevated, total bilirubin elevated, and INR elevated.  CT abdomen and pelvis without acute findings. Troponin also elevated.  Findings on EKG were suspected to be old as patient was without any chest pain. He was started on broad-spectrum antibiotics and given several boluses of IV fluids. GI was consulted for hepatitis.  Cardiology consulted for elevated troponin.  Increased liver enzymes with increased INR: AST 1189 (H), ALT 939 (H), alk phos 329 (H), total bilirubin 2.7 (H), INR 1.6 (H).  Platelets normal at 300.  Notably, liver enzymes are 10 times greater than they were on 03/09/2019.  CT abdomen and pelvis without acute abnormality. Liver without focal abnormality. No gallstones, gallbladder wall thickening, or biliary dilation.  Patient denies history of alcohol or drug use.  No tattoos.  No known history of liver disease.  Admits to taking Tylenol occasionally but none recently.  He is without abdominal pain, nausea, vomiting, or any other significant upper or lower GI symptoms. He is ill appearing. Mild scleral icterus noted. Abdominal exam benign.   I am most suspicious for ischemic hepatitis in the setting of mild hypotension, sepsis, and cold extremities with purple discoloration of lower extremities.  Cannot rule out acute viral hepatitis although less likely.  We will also check a Tylenol level for completeness.  Atorvastatin listed on patient's outpatient medications which could influence LFTs; however, I doubt this is cause of the acute elevation. We will need to continue to trend his LFTs and monitor this closely.   Plan: Obtain Tylenol level.  Called the  lab to have this added on to prior blood work. Follow-up on acute hepatitis panel  that has already been ordered Discussed maintenance IV fluids with hospitalist.  Per hospitalist, okay to go ahead and place order for IV normal saline at 125/h starting now. Avoid hematotoxic medications including tylenol and atorvastatin for now.  Recheck CMP, INR in the am.      LOS: 0 days    03/13/2019, 4:13 PM   Aliene Altes, [facility]

## 2019-03-13 NOTE — Progress Notes (Signed)
Secure chatted with Matthew Derby, RN @ 1045 about needing LA & other labs drawn. RN informed me that pt was very difficult stick and that labs were already previously drawn at 1015 using [place].

## 2019-03-14 ENCOUNTER — Inpatient Hospital Stay (HOSPITAL_COMMUNITY): Payer: Medicare Other

## 2019-03-14 DIAGNOSIS — L8990 Pressure ulcer of unspecified site, unspecified stage: Secondary | ICD-10-CM | POA: Insufficient documentation

## 2019-03-14 DIAGNOSIS — I361 Nonrheumatic tricuspid (valve) insufficiency: Secondary | ICD-10-CM

## 2019-03-14 DIAGNOSIS — A419 Sepsis, unspecified organism: Principal | ICD-10-CM

## 2019-03-14 DIAGNOSIS — I429 Cardiomyopathy, unspecified: Secondary | ICD-10-CM

## 2019-03-14 DIAGNOSIS — I248 Other forms of acute ischemic heart disease: Secondary | ICD-10-CM

## 2019-03-14 DIAGNOSIS — I10 Essential (primary) hypertension: Secondary | ICD-10-CM

## 2019-03-14 DIAGNOSIS — K7200 Acute and subacute hepatic failure without coma: Secondary | ICD-10-CM

## 2019-03-14 DIAGNOSIS — E785 Hyperlipidemia, unspecified: Secondary | ICD-10-CM

## 2019-03-14 DIAGNOSIS — R6521 Severe sepsis with septic shock: Secondary | ICD-10-CM

## 2019-03-14 DIAGNOSIS — I340 Nonrheumatic mitral (valve) insufficiency: Secondary | ICD-10-CM

## 2019-03-14 DIAGNOSIS — K759 Inflammatory liver disease, unspecified: Secondary | ICD-10-CM

## 2019-03-14 LAB — URINALYSIS, ROUTINE W REFLEX MICROSCOPIC
Bacteria, UA: NONE SEEN
Bilirubin Urine: NEGATIVE
Glucose, UA: >=500 mg/dL — AB
Hgb urine dipstick: NEGATIVE
Ketones, ur: 5 mg/dL — AB
Leukocytes,Ua: NEGATIVE
Nitrite: NEGATIVE
Protein, ur: 30 mg/dL — AB
Specific Gravity, Urine: 1.032 — ABNORMAL HIGH (ref 1.005–1.030)
pH: 5 (ref 5.0–8.0)

## 2019-03-14 LAB — ECHOCARDIOGRAM COMPLETE
Height: 72 in
Weight: 3806.02 oz

## 2019-03-14 LAB — CBC
HCT: 47.2 % (ref 39.0–52.0)
Hemoglobin: 14.9 g/dL (ref 13.0–17.0)
MCH: 30.3 pg (ref 26.0–34.0)
MCHC: 31.6 g/dL (ref 30.0–36.0)
MCV: 96.1 fL (ref 80.0–100.0)
Platelets: 271 10*3/uL (ref 150–400)
RBC: 4.91 MIL/uL (ref 4.22–5.81)
RDW: 13.2 % (ref 11.5–15.5)
WBC: 13.8 10*3/uL — ABNORMAL HIGH (ref 4.0–10.5)
nRBC: 0 % (ref 0.0–0.2)

## 2019-03-14 LAB — GLUCOSE, CAPILLARY
Glucose-Capillary: 112 mg/dL — ABNORMAL HIGH (ref 70–99)
Glucose-Capillary: 140 mg/dL — ABNORMAL HIGH (ref 70–99)
Glucose-Capillary: 148 mg/dL — ABNORMAL HIGH (ref 70–99)
Glucose-Capillary: 155 mg/dL — ABNORMAL HIGH (ref 70–99)
Glucose-Capillary: 165 mg/dL — ABNORMAL HIGH (ref 70–99)

## 2019-03-14 LAB — COMPREHENSIVE METABOLIC PANEL
ALT: 1187 U/L — ABNORMAL HIGH (ref 0–44)
ALT: 1311 U/L — ABNORMAL HIGH (ref 0–44)
AST: 1500 U/L — ABNORMAL HIGH (ref 15–41)
AST: 1488 U/L — ABNORMAL HIGH (ref 15–41)
Albumin: 3.3 g/dL — ABNORMAL LOW (ref 3.5–5.0)
Albumin: 3.3 g/dL — ABNORMAL LOW (ref 3.5–5.0)
Alkaline Phosphatase: 297 U/L — ABNORMAL HIGH (ref 38–126)
Alkaline Phosphatase: 301 U/L — ABNORMAL HIGH (ref 38–126)
Anion gap: 15 (ref 5–15)
Anion gap: 14 (ref 5–15)
BUN: 34 mg/dL — ABNORMAL HIGH (ref 8–23)
BUN: 35 mg/dL — ABNORMAL HIGH (ref 8–23)
CO2: 18 mmol/L — ABNORMAL LOW (ref 22–32)
CO2: 16 mmol/L — ABNORMAL LOW (ref 22–32)
Calcium: 8.8 mg/dL — ABNORMAL LOW (ref 8.9–10.3)
Calcium: 8.1 mg/dL — ABNORMAL LOW (ref 8.9–10.3)
Chloride: 97 mmol/L — ABNORMAL LOW (ref 98–111)
Chloride: 94 mmol/L — ABNORMAL LOW (ref 98–111)
Creatinine, Ser: 0.84 mg/dL (ref 0.61–1.24)
Creatinine, Ser: 1.21 mg/dL (ref 0.61–1.24)
GFR calc Af Amer: >60 mL/min (ref >60)
GFR calc Af Amer: >60 mL/min (ref >60)
GFR calc non Af Amer: >60 mL/min (ref >60)
GFR calc non Af Amer: >60 mL/min (ref >60)
Glucose, Bld: 111 mg/dL — ABNORMAL HIGH (ref 70–99)
Glucose, Bld: 173 mg/dL — ABNORMAL HIGH (ref 70–99)
Potassium: 4.4 mmol/L (ref 3.5–5.1)
Potassium: 5 mmol/L (ref 3.5–5.1)
Sodium: 130 mmol/L — ABNORMAL LOW (ref 135–145)
Sodium: 124 mmol/L — ABNORMAL LOW (ref 135–145)
Total Bilirubin: 3.1 mg/dL — ABNORMAL HIGH (ref 0.3–1.2)
Total Bilirubin: 3.2 mg/dL — ABNORMAL HIGH (ref 0.3–1.2)
Total Protein: 6.2 g/dL — ABNORMAL LOW (ref 6.5–8.1)
Total Protein: 6.1 g/dL — ABNORMAL LOW (ref 6.5–8.1)

## 2019-03-14 LAB — TSH: TSH: 0.599 u[IU]/mL (ref 0.350–4.500)

## 2019-03-14 LAB — PROTIME-INR
INR: 2.2 — ABNORMAL HIGH (ref 0.8–1.2)
INR: 2.6 — ABNORMAL HIGH (ref 0.8–1.2)
Prothrombin Time: 24.2 seconds — ABNORMAL HIGH (ref 11.4–15.2)
Prothrombin Time: 27.7 seconds — ABNORMAL HIGH (ref 11.4–15.2)

## 2019-03-14 LAB — COOXEMETRY PANEL
Carboxyhemoglobin: 1.3 % (ref 0.5–1.5)
Carboxyhemoglobin: 1.4 % (ref 0.5–1.5)
Methemoglobin: 1 % (ref 0.0–1.5)
Methemoglobin: 1 % (ref 0.0–1.5)
O2 Saturation: 60 %
O2 Saturation: 74 %
Total hemoglobin: 14.9 g/dL (ref 12.0–16.0)
Total hemoglobin: 14.9 g/dL (ref 12.0–16.0)

## 2019-03-14 LAB — HEPATITIS PANEL, ACUTE
HCV Ab: NONREACTIVE
Hep A IgM: NONREACTIVE
Hep B C IgM: NONREACTIVE
Hepatitis B Surface Ag: NONREACTIVE

## 2019-03-14 LAB — LACTIC ACID, PLASMA: Lactic Acid, Venous: 4.4 mmol/L (ref 0.5–1.9)

## 2019-03-14 LAB — CORTISOL: Cortisol, Plasma: 82.8 ug/dL

## 2019-03-14 LAB — HEMOGLOBIN A1C
Hgb A1c MFr Bld: 6.3 % — ABNORMAL HIGH (ref 4.8–5.6)
Mean Plasma Glucose: 134.11 mg/dL

## 2019-03-14 LAB — TROPONIN I (HIGH SENSITIVITY): Troponin I (High Sensitivity): 167 ng/L (ref <18)

## 2019-03-14 LAB — BRAIN NATRIURETIC PEPTIDE: B Natriuretic Peptide: 1338.6 pg/mL — ABNORMAL HIGH (ref 0.0–100.0)

## 2019-03-14 MED ORDER — SODIUM CHLORIDE 0.9 % IV BOLUS
500.0000 mL | Freq: Once | INTRAVENOUS | Status: AC
  Administered 2019-03-14: 500 mL via INTRAVENOUS

## 2019-03-14 MED ORDER — SODIUM CHLORIDE 0.9 % IV SOLN
2.0000 g | INTRAVENOUS | Status: DC
  Administered 2019-03-14: 2 g via INTRAVENOUS
  Filled 2019-03-14: qty 20

## 2019-03-14 MED ORDER — NOREPINEPHRINE 4 MG/250ML-% IV SOLN
INTRAVENOUS | Status: AC
  Administered 2019-03-14: 5 ug/min via INTRAVENOUS
  Filled 2019-03-14: qty 250

## 2019-03-14 MED ORDER — ENOXAPARIN SODIUM 60 MG/0.6ML ~~LOC~~ SOLN
55.0000 mg | SUBCUTANEOUS | Status: DC
  Administered 2019-03-14 – 2019-03-21 (×7): 55 mg via SUBCUTANEOUS
  Filled 2019-03-14: qty 0.55
  Filled 2019-03-14: qty 0.6
  Filled 2019-03-14: qty 0.55
  Filled 2019-03-14: qty 0.6
  Filled 2019-03-14: qty 0.55
  Filled 2019-03-14 (×2): qty 0.6
  Filled 2019-03-14: qty 0.55
  Filled 2019-03-14 (×2): qty 0.6

## 2019-03-14 MED ORDER — SODIUM CHLORIDE 0.9 % IV SOLN
500.0000 mg | INTRAVENOUS | Status: DC
  Administered 2019-03-14: 500 mg via INTRAVENOUS
  Filled 2019-03-14 (×2): qty 500

## 2019-03-14 MED ORDER — PERFLUTREN LIPID MICROSPHERE
1.0000 mL | INTRAVENOUS | Status: DC | PRN
  Administered 2019-03-14: 0.5 mL via INTRAVENOUS

## 2019-03-14 MED ORDER — NOREPINEPHRINE 4 MG/250ML-% IV SOLN: 0.0000 ug/min | INTRAVENOUS | Status: DC

## 2019-03-14 MED ORDER — VITAMIN K1 10 MG/ML IJ SOLN
10.0000 mg | Freq: Once | INTRAVENOUS | Status: AC
  Administered 2019-03-14: 10 mg via INTRAVENOUS
  Filled 2019-03-14: qty 1

## 2019-03-14 MED ORDER — INSULIN ASPART 100 UNIT/ML ~~LOC~~ SOLN
0.0000 [IU] | SUBCUTANEOUS | Status: DC
  Administered 2019-03-14: 3 [IU] via SUBCUTANEOUS
  Administered 2019-03-15: 5 [IU] via SUBCUTANEOUS
  Administered 2019-03-15 (×2): 2 [IU] via SUBCUTANEOUS
  Administered 2019-03-16: 3 [IU] via SUBCUTANEOUS
  Administered 2019-03-16: 2 [IU] via SUBCUTANEOUS
  Administered 2019-03-16 – 2019-03-17 (×2): 3 [IU] via SUBCUTANEOUS
  Administered 2019-03-17 (×2): 5 [IU] via SUBCUTANEOUS
  Administered 2019-03-17 (×2): 2 [IU] via SUBCUTANEOUS
  Administered 2019-03-18: 3 [IU] via SUBCUTANEOUS
  Administered 2019-03-18: 2 [IU] via SUBCUTANEOUS
  Administered 2019-03-18: 3 [IU] via SUBCUTANEOUS
  Administered 2019-03-18: 2 [IU] via SUBCUTANEOUS
  Administered 2019-03-18 – 2019-03-19 (×2): 3 [IU] via SUBCUTANEOUS
  Administered 2019-03-19 (×2): 2 [IU] via SUBCUTANEOUS
  Administered 2019-03-19: 3 [IU] via SUBCUTANEOUS
  Administered 2019-03-20: 5 [IU] via SUBCUTANEOUS
  Administered 2019-03-20: 3 [IU] via SUBCUTANEOUS
  Administered 2019-03-20: 2 [IU] via SUBCUTANEOUS
  Administered 2019-03-20: 3 [IU] via SUBCUTANEOUS
  Administered 2019-03-20: 2 [IU] via SUBCUTANEOUS
  Administered 2019-03-21: 3 [IU] via SUBCUTANEOUS
  Administered 2019-03-21: 5 [IU] via SUBCUTANEOUS
  Administered 2019-03-21 (×2): 3 [IU] via SUBCUTANEOUS
  Administered 2019-03-21: 5 [IU] via SUBCUTANEOUS
  Administered 2019-03-22 (×2): 3 [IU] via SUBCUTANEOUS
  Administered 2019-03-22: 5 [IU] via SUBCUTANEOUS
  Administered 2019-03-22: 8 [IU] via SUBCUTANEOUS
  Administered 2019-03-22 – 2019-03-23 (×2): 2 [IU] via SUBCUTANEOUS
  Administered 2019-03-23: 5 [IU] via SUBCUTANEOUS
  Administered 2019-03-23: 3 [IU] via SUBCUTANEOUS
  Administered 2019-03-23: 2 [IU] via SUBCUTANEOUS
  Administered 2019-03-23: 5 [IU] via SUBCUTANEOUS
  Administered 2019-03-24: 2 [IU] via SUBCUTANEOUS
  Administered 2019-03-24: 5 [IU] via SUBCUTANEOUS
  Administered 2019-03-24: 3 [IU] via SUBCUTANEOUS
  Administered 2019-03-24: 8 [IU] via SUBCUTANEOUS
  Administered 2019-03-25: 5 [IU] via SUBCUTANEOUS
  Administered 2019-03-25 (×2): 3 [IU] via SUBCUTANEOUS
  Administered 2019-03-26: 8 [IU] via SUBCUTANEOUS
  Administered 2019-03-27 (×3): 3 [IU] via SUBCUTANEOUS
  Administered 2019-03-27 – 2019-03-28 (×2): 2 [IU] via SUBCUTANEOUS
  Administered 2019-03-28: 3 [IU] via SUBCUTANEOUS
  Administered 2019-03-28: 5 [IU] via SUBCUTANEOUS
  Administered 2019-03-28 – 2019-03-29 (×2): 2 [IU] via SUBCUTANEOUS
  Administered 2019-03-29: 13:00:00 3 [IU] via SUBCUTANEOUS
  Administered 2019-03-30: 2 [IU] via SUBCUTANEOUS
  Administered 2019-03-30: 10:00:00 3 [IU] via SUBCUTANEOUS
  Administered 2019-03-30: 2 [IU] via SUBCUTANEOUS

## 2019-03-14 MED ORDER — FUROSEMIDE 10 MG/ML IJ SOLN
80.0000 mg | Freq: Once | INTRAMUSCULAR | Status: AC
  Administered 2019-03-15: 80 mg via INTRAVENOUS
  Filled 2019-03-14: qty 8

## 2019-03-14 NOTE — Progress Notes (Signed)
eLink Physician-Brief Progress Note Patient Name: Matthew Mccormick DOB: 1955-12-14 MRN: [id number]   Date of Service  03/14/2019  HPI/Events of Note  CVP = 18. BP = 82/71 with MAP = 77.  eICU Interventions  Will order: 1. COOX STAT. 2. RT to place A-line.     Intervention Category Major Interventions: Acid-Base disturbance - evaluation and management  Liborio Saccente Dennard Nip 03/14/2019, 9:53 PM

## 2019-03-14 NOTE — Progress Notes (Signed)
CRITICAL VALUE ALERT  Critical Value:  Troponin 167  Date & Time Notied:  03/14/2019 1640  Provider Notified: Dr. Karilyn Cota  Orders Received/Actions taken: none at this time. Pt has orders to transfer to [facility]

## 2019-03-14 NOTE — Progress Notes (Signed)
  I reviewed the patient again with Dr. Craige Cotta, critical care, and we both agree that the patient is getting sicker with ongoing hypotension, requiring inotropic support and desaturating. Dr. Craige Cotta is placing central line and we both agree that the patient should be transferred to [facility] in the intensive care unit.  His liver function is deteriorating also and this is concerning for shock liver.

## 2019-03-14 NOTE — Progress Notes (Signed)
Pt had one episode of incontinence at 2100 with no output since. Patient has urge to void. Bladder scan showed >999. Paged for orders to in and out cath patient

## 2019-03-14 NOTE — Progress Notes (Signed)
SLP Cancellation Note  Patient Details Name: Matthew Mccormick MRN: [id number] DOB: 03-Apr-1955   Cancelled treatment:       Reason Eval/Treat Not Completed: Medical issues which prohibited therapy(Pt pending transfer to [facility] ICU and nursing/MD d/c SLP)  Thank you,  Havery Moros, CCC-SLP [phone]  Matthew Mccormick 03/14/2019, 2:33 PM

## 2019-03-14 NOTE — Progress Notes (Signed)
Brief Progress Note  This is a 64 year old gentleman with a history of cardiomyopathy, atrial fibrillation, diabetes and hypertension.  He presents as a transfer from [facility] with concern for cardiogenic shock and heart failure decompensation.  His EF today was 20% and we do not have an old one on file.  He was seen by cardiology at [facility] as well as critical care earlier today.   On my exam he is cold and wet, he does have been slightly dopplerable pulses in the bilateral lower extremities and chronic venous stasis.  He is encephalopathic and delirious, but asking for water.  He has 2+ pitting edema bilaterally.  I discussed his case with Dr. Herbie Baltimore who is on-call for cardiology, heart failure team will see him tomorrow.  In the meantime I will diurese him with IV Lasix 80 mg IV x1 and monitor urine output.  He may need some low-dose norepinephrine support today.  He was on this earlier today and was able to be weaned off shortly on arrival to the ICU.  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  He will probably need left and right heart cath.  PCCM will follow in the morning.  Durel Salts, MD Pulmonary and Critical Care Medicine [facility] Pager: [phone] [phone]

## 2019-03-14 NOTE — Progress Notes (Signed)
Received orders to in and out cath patient. This is patient's second in and out cath in less than 24 hours. Hand hygiene performed, sterile technique used to prep patient. Peri care post catheterization.

## 2019-03-14 NOTE — Procedures (Signed)
Central Venous Catheter Insertion Procedure Note Matthew Mccormick [id number] 1955/03/19  Procedure: Insertion of Central Venous Catheter Indications: Assessment of intravascular volume  Procedure Details Consent: Risks of procedure as well as the alternatives and risks of each were explained to the (patient/caregiver).  Consent for procedure obtained. Time Out: Verified patient identification, verified procedure, site/side was marked, verified correct patient position, special equipment/implants available, medications/allergies/relevent history reviewed, required imaging and test results available.  Performed  Maximum sterile technique was used including antiseptics, cap, gloves, gown, hand hygiene, mask and sheet. Skin prep: Chlorhexidine; local anesthetic administered A antimicrobial bonded/coated triple lumen catheter was placed in the left subclavian vein using the Seldinger technique.  Attempted Lt IJ.  Visualized with ultrasound.  Unable to advance guidewire.  Changed to Lt [place].  Placed w/o difficulty.  Evaluation Blood flow good Complications: No apparent complications Patient did tolerate procedure well. Chest X-ray ordered to verify placement.  CXR: normal.  [name], MD [facility] Pulmonary/Critical Care 03/14/2019, 2:51 PM

## 2019-03-14 NOTE — Progress Notes (Signed)
eLink Physician-Brief Progress Note Patient Name: Matthew Mccormick DOB: 09-02-1955 MRN: [id number]   Date of Service  03/14/2019  HPI/Events of Note  Lactic Acid = 4.4 - hgb = 14.9, BP = 114/74 and CVP = NA.  eICU Interventions  Will order: 1. Obtain CVP as ordered. 2. Continue to trend Lactic Acid level.      Intervention Category Major Interventions: Acid-Base disturbance - evaluation and management  [name] 03/14/2019, 8:07 PM

## 2019-03-14 NOTE — Progress Notes (Signed)
Subjective: Alert today. States he fell going to the bathroom and was wedged between bed and dresser. Aide found him and EMS was called. Denies abdominal pain. No N/V. States he has had no diarrhea. Denies history of liver disease. Nursing staff deny any recent stool output and state he has been alert and oriented.   Objective: Vital signs in last 24 hours: Temp:  [96 F (35.6 C)-98.8 F (37.1 C)] 98.1 F (36.7 C) (02/03 0746) Pulse Rate:  [82-106] 104 (02/03 0746) Resp:  [10-24] 10 (02/03 0746) BP: (71-112)/(35-90) 110/78 (02/03 0600) SpO2:  [100 %] 100 % (02/03 0746) Weight:  [90.7 kg-107.9 kg] 107.9 kg (02/03 0500)   General:   Alert and oriented to person, place, time, situation. Chronically ill-appearing Head:  Normocephalic and atraumatic. Eyes:  Mild scleral icterus Lungs: scattered rhonchi. Unlabored.  Abdomen:  Bowel sounds present, obese but soft, non-tender, non-distended. No HSM or hernias noted. No rebound or guarding. No masses appreciated  Extremities:  With pedal edema, 1+ lower extremity edema Neurologic:  Alert and  oriented to person, place, time, situation.  Psych:  Flat affect  Intake/Output from previous day: 02/02 0701 - 02/03 0700 In: 5751.4 [I.V.:1201.4; IV Piggyback:4550] Out: 2200 [Urine:2200] Intake/Output this shift: No intake/output data recorded.  Lab Results: Recent Labs    03/13/19 1056 03/14/19 0605  WBC 16.3* 13.8*  HGB 14.8 14.9  HCT 45.7 47.2  PLT 300 271   BMET Recent Labs    03/13/19 1056 03/14/19 0605  NA 128* 130*  K 4.5 4.4  CL 90* 97*  CO2 24 18*  GLUCOSE 82 111*  BUN 37* 34*  CREATININE 1.17 0.84  CALCIUM 9.3 8.8*   LFT Recent Labs    03/13/19 1056 03/14/19 0605  PROT 7.4 6.2*  ALBUMIN 4.0 3.3*  AST 1,189* 1,500*  ALT 939* 1,187*  ALKPHOS 329* 297*  BILITOT 2.7* 3.1*   PT/INR Recent Labs    03/13/19 1056 03/14/19 0605  LABPROT 19.0* 24.2*  INR 1.6* 2.2*    Studies/Results: CT Head Wo  Contrast  Result Date: 03/13/2019 CLINICAL DATA:  Headache EXAM: CT HEAD WITHOUT CONTRAST TECHNIQUE: Contiguous axial images were obtained from the base of the skull through the vertex without intravenous contrast. COMPARISON:  03/09/2019 FINDINGS: Brain: No evidence of acute infarction, hemorrhage, hydrocephalus, extra-axial collection or mass lesion/mass effect. 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 Additional areas of encephalomalacia in the left frontal and parietal lobes are stable from prior. Scattered low-density changes within the periventricular and subcortical white matter compatible with chronic microvascular ischemic change. Mild diffuse cerebral volume loss. Vascular: Mild atherosclerotic calcifications involving the large vessels of the skull base. No unexpected hyperdense vessel. Skull: Normal. Negative for fracture or focal lesion. Sinuses/Orbits: Sinonasal polyps versus mucous retention cysts in the right maxillary and left sphenoid sinuses. Paranasal sinuses are otherwise clear. Orbital structures unremarkable. Other: None. IMPRESSION: No acute intracranial findings.  Stable exam from 03/09/2019. Electronically Signed   By: Duanne Guess D.O.   On: 03/13/2019 11:42   DG Chest Portable 1 View  Result Date: 03/13/2019 CLINICAL DATA:  Larey Seat, right rib pain, hypertension, diabetes EXAM: PORTABLE CHEST 1 VIEW COMPARISON:  03/09/2019 FINDINGS: Single frontal view of the chest demonstrates a stable cardiac silhouette. No airspace disease, effusion, or pneumothorax. Chronic right posterior seventh rib fracture. IMPRESSION: No active disease. Electronically Signed   By: Sharlet Salina M.D.   On: 03/13/2019 10:59   CT Angio Chest/Abd/Pel for Dissection W and/or  Wo Contrast  Result Date: 03/13/2019 CLINICAL DATA:  Abdominal pain, remote fall EXAM: CT ANGIOGRAPHY CHEST, ABDOMEN AND PELVIS TECHNIQUE: Multidetector CT imaging through the chest, abdomen and pelvis was performed  using the standard protocol during bolus administration of intravenous contrast. Multiplanar reconstructed images and MIPs were obtained and reviewed to evaluate the vascular anatomy. CONTRAST:  137mL OMNIPAQUE IOHEXOL 350 MG/ML SOLN COMPARISON:  None. FINDINGS: CTA CHEST FINDINGS Cardiovascular: Preferential opacification of the thoracic aorta. No evidence of thoracic aortic aneurysm or dissection. Normal heart size. No pericardial effusion. Thoracic aortic atherosclerosis. Multi vessel coronary artery atherosclerosis. Mediastinum/Nodes: No enlarged mediastinal, hilar, or axillary lymph nodes. Thyroid gland, trachea, and esophagus demonstrate no significant findings. Lungs/Pleura: Small bilateral pleural effusions, right greater than left. Right lower lobe patchy airspace disease. No pneumothorax. Musculoskeletal: No chest wall abnormality. No acute or significant osseous findings. Review of the MIP images confirms the above findings. CTA ABDOMEN AND PELVIS FINDINGS VASCULAR Aorta: Normal caliber aorta without aneurysm, dissection, vasculitis or significant stenosis. Abdominal aortic atherosclerosis. Celiac: Patent without evidence of aneurysm, dissection, vasculitis or significant stenosis. SMA: Patent without evidence of aneurysm, dissection, vasculitis or significant stenosis. Renals: Both renal arteries are patent without evidence of aneurysm, dissection, vasculitis, fibromuscular dysplasia or significant stenosis. IMA: Patent without evidence of aneurysm, dissection, vasculitis or significant stenosis. Inflow: Patent without evidence of aneurysm, dissection, vasculitis or significant stenosis. Veins: No obvious venous abnormality within the limitations of this arterial phase study. Review of the MIP images confirms the above findings. NON-VASCULAR Hepatobiliary: No focal liver abnormality is seen. No gallstones, gallbladder wall thickening, or biliary dilatation. Pancreas: Unremarkable. No pancreatic ductal  dilatation or surrounding inflammatory changes. Spleen: Normal in size without focal abnormality. Adrenals/Urinary Tract: Adrenal glands are unremarkable. Kidneys are normal, without renal calculi, focal lesion, or hydronephrosis. Bladder is unremarkable. Stomach/Bowel: Stomach is within normal limits. Appendix appears normal. No evidence of bowel wall thickening, distention, or inflammatory changes. Lymphatic: No lymphadenopathy. Reproductive: Prostate is unremarkable. Other: Small amount of abdominal and pelvic free fluid. Mild anasarca. Musculoskeletal: No acute osseous abnormality. No aggressive osseous lesion. Review of the MIP images confirms the above findings. IMPRESSION: 1. No evidence of an aortic dissection.  No aortic aneurysm. 2.  Aortic Atherosclerosis (ICD10-I70.0). 3. Right lower lobe pneumonia. 4. Small amount of pelvic free fluid. Electronically Signed   By: Kathreen Devoid   On: 03/13/2019 11:55    Assessment: 64 year old male presenting after unwitnessed fall at home, admitted with  sepsis likely secondary to pneumonia, found to have elevated liver enzymes most consistent with ischemic hepatitis. Acute hepatitis panel pending but less likely viral etiology. Clinically, he is improved today, without any signs of encephalopathy. INR trending up from yesterday (1.6 to 2.2). No known underlying liver disease. Anticipate LFTs will soon be at a plateau with steady trend downwards thereafter if dealing with ischemic hepatitis as expected. Bilirubin could have lag effect. Encouragingly, peripheral cyanosis has also significantly improved. Discussed patient with Dr. Anastasio Champion this morning.   Elevated troponin: awaiting cardiology consultation but does not appear to have ACS.    Plan: Monitor for mental status changes, encephalopathy Continue to trend LFTs Continue to follow INR daily Will give dose of Vit K today Follow-up on acute hepatitis panel as comes available Hold off on further  serologies unless LFTs worsen or do not improve over next 24-48 hours.    Annitta Needs, PhD, ANP-BC [facility]    LOS: 1 day    03/14/2019, 8:04 AM

## 2019-03-14 NOTE — Consult Note (Addendum)
Cardiology Consult    Patient ID: Matthew Mccormick; [id number]; Aug 16, 1955   Admit date: 03/13/2019 Date of Consult: 03/14/2019  Primary Care Provider: Kirstie Peri, MD Primary Cardiologist: New to [facility] -  Prentice Docker, MD   Patient Profile    Matthew Mccormick is a 64 y.o. male with past medical history of HTN, HLD, IDDM and prior CVA who is being seen today for the evaluation of elevated troponin values at the request of Dr. Karilyn Cota.   History of Present Illness    Mr. Forstrom presented to [facility] ED on 03/09/2019 for evaluation of worsening weakness and decreased appetite. Lab work was unrevealing at that time and Head CT showed no acute intracranial abnormalities. Was noted to have chronic infarcts in the right occipital lobe, left frontal lobe and posterior parietal region on the left. CT Abdomen showed mild areas of atelectasis or infiltrate and small bilateral effusions with aortic atherosclerosis and chronic bilateral rib fractures. He was discharged home but presented back to the ED on 03/13/2019 after being found on the floor by family.   Initial labs showed WBC 16.3, Hgb 14.8, platelets 300, Na+ 128, K+ 4.5 and creatinine 1.17. AST 1189 and ALT 939. Lactic Acid 3.6. Respiratory Panel negative for COVID. Initial HS Troponin 109 with repeat 105. CTA showed no evidence of aortic dissection but was noted to have RLL PNA. Repeat CT Head with no acute changes when compared to prior tracings. EKG shows sinus tachycardia, HR 106 with LVH and nonspecific IVCD.   He was admitted for sepsis in the setting of PNA and has been started on antibiotic therapy. GI was consulted due to elevated LFT's which are thought to be secondary to ischemic hepatitis. Acute hepatitis panel pending.   In talking with the patient today, he reports he lost his balance when walking from the bathroom on the day of admission.  He is unsure of how long he was on the ground.  He reports living by himself but family members  check on him frequently.  He denies any current symptoms at this time.  No reports of recent chest pain or palpitations.  He reports he used to walk frequently from his apartment to [facility] but has not done so within several months.  Says he would occasionally have chest discomfort with this but no recent episodes.  He denies any recent orthopnea or PND.  He does have chronic lower extremity edema.  I prior note mentions a history of atrial fibrillation but he is unaware of any known history of cardiac arrhythmias or known CAD.  By review of Care Everywhere he did have a cardiac monitor in 2018 which showed sinus rhythm with occasional PVC's but no episodes of atrial fibrillation or significant arrhythmias. Echocardiogram at that time showed a reduced EF of 30-35% with anterolateral wall HK , Grade 2 DD and mild to moderate MR. The patient is unaware of any family history of cardiac issues as well.   Past Medical History:  Diagnosis Date  . 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 Chronic kidney disease   . Diabetes mellitus without complication (HCC)   . Hyperlipidemia   . Hypertension   . Slow rate of speech   . Stroke [facility])    1 year ago-left and shaky and unsteady on feet.    Past Surgical History:  Procedure Laterality Date  . CATARACT EXTRACTION W/PHACO Right 06/18/2013   Procedure: CATARACT EXTRACTION PHACO AND INTRAOCULAR  LENS PLACEMENT (IOC);  Surgeon: Gemma Payor, MD;  Location: [facility];  Service: Ophthalmology;  Laterality: Right;  CDE 34.55  . ORIF ANKLE DISLOCATION Right      Home Medications:  Prior to Admission medications   Medication Sig Start Date End Date Taking? Authorizing Provider  amLODipine (NORVASC) 10 MG tablet Take 10 mg by mouth daily.    [provider]  aspirin EC 81 MG tablet Take 81 mg by mouth daily.    [provider]  atenolol (TENORMIN) 100 MG tablet Take 100 mg by mouth daily.    [provider]  atorvastatin  (LIPITOR) 40 MG tablet Take 40 mg by mouth daily.    [provider]  carvedilol (COREG) 3.125 MG tablet Take 3.125 mg by mouth 2 (two) times daily with a meal.  03/12/16 11/07/17  [provider]  cephALEXin (KEFLEX) 500 MG capsule Take 1 capsule (500 mg total) by mouth 4 (four) times daily. 03/09/19   Geoffery Lyons, MD  clopidogrel (PLAVIX) 75 MG tablet Take 75 mg by mouth daily.     [provider]  empagliflozin (JARDIANCE) 10 MG TABS tablet Take 10 mg by mouth daily.     [provider]  insulin detemir (LEVEMIR) 100 UNIT/ML injection Inject 40 Units into the skin daily.    [provider]  lisinopril (PRINIVIL,ZESTRIL) 10 MG tablet Take 10 mg by mouth daily.    [provider]  metFORMIN (GLUCOPHAGE) 1000 MG tablet Take 1,000 mg by mouth 2 (two) times daily with a meal.    [provider]  potassium chloride (KLOR-CON) 10 MEQ tablet Take 10 mEq by mouth daily. 01/17/19   [provider]    Inpatient Medications: Scheduled Meds: . Chlorhexidine Gluconate Cloth  6 each Topical Daily  . clopidogrel  75 mg Oral Daily  . mouth rinse  15 mL Mouth Rinse BID   Continuous Infusions: . sodium chloride 100 mL/hr at 03/14/19 0828  . ceFEPime (MAXIPIME) IV 2 g (03/14/19 0302)  . phytonadione (VITAMIN K) IV    . vancomycin 1,250 mg (03/14/19 0013)   PRN Meds: ondansetron **OR** ondansetron (ZOFRAN) IV  Allergies:   No Known Allergies  Social History:   Social History   Socioeconomic History  . Marital status: Single    Spouse name: Not on file  . Number of children: Not on file  . Years of education: Not on file  . Highest education level: Not on file  Occupational History  . Not on file  Tobacco Use  . Smoking status: Former Smoker    Packs/day: 1.00    Years: 1.00    Pack years: 1.00    Types: Cigarettes    Quit date: 06/16/1991    Years since quitting: 27.7  . Smokeless tobacco: Never Used  Substance and  Sexual Activity  . Alcohol use: No  . Drug use: No  . Sexual activity: Yes    Birth control/protection: None  Other Topics Concern  . Not on file  Social History Narrative   Pt lives alone in 1 story home   8th grade education   Has home health aid 6days/week   Social Determinants of Health   Financial Resource Strain:   . Difficulty of Paying Living Expenses: Not on file  Food Insecurity:   . Worried About Programme researcher, broadcasting/film/video in the Last Year: Not on file  . Ran Out of Food in the Last Year: Not on file  Transportation  Needs:   . Lack of Transportation (Medical): Not on file  . Lack of Transportation (Non-Medical): Not on file  Physical Activity:   . Days of Exercise per Week: Not on file  . Minutes of Exercise per Session: Not on file  Stress:   . Feeling of Stress : Not on file  Social Connections:   . Frequency of Communication with Friends and Family: Not on file  . Frequency of Social Gatherings with Friends and Family: Not on file  . Attends Religious Services: Not on file  . Active Member of Clubs or Organizations: Not on file  . Attends Banker Meetings: Not on file  . Marital Status: Not on file  Intimate Partner Violence:   . Fear of Current or Ex-Partner: Not on file  . Emotionally Abused: Not on file  . Physically Abused: Not on file  . Sexually Abused: Not on file     Family History:    Family History  Problem Relation Age of Onset  . Liver disease Neg Hx       Review of Systems    General:  No chills, fever, night sweats or weight changes.  Cardiovascular:  No chest pain, dyspnea on exertion, orthopnea, palpitations, paroxysmal nocturnal dyspnea. Positive for edema.  Dermatological: No rash, lesions/masses Respiratory: No cough, dyspnea Urologic: No hematuria, dysuria Abdominal:   No nausea, vomiting, diarrhea, bright red blood per rectum, melena, or hematemesis Neurologic:  No visual changes, wkns, changes in mental status. All  other systems reviewed and are otherwise negative except as noted above.  Physical Exam/Data    Vitals:   03/14/19 0500 03/14/19 0600 03/14/19 0746 03/14/19 0800  BP: 105/78 110/78  120/67  Pulse: (!) 102 (!) 106 (!) 104 (!) 105  Resp: Temp:   98.1 F (36.7 C)   TempSrc:   Axillary   SpO2: 100% 100% 100% 100%  Weight: 107.9 kg     Height:        Intake/Output Summary (Last 24 hours) at 03/14/2019 0934 Last data filed at 03/14/2019 9604 Gross per 24 hour  Intake 5751.4 ml  Output 2200 ml  Net 3551.4 ml   Filed Weights   03/13/19 0906 03/13/19 1652 03/14/19 0500  Weight: 90.7 kg 107.4 kg 107.9 kg   Body mass index is 32.26 kg/m.   General: Pleasant male appearing in  NAD Psych: Normal affect. Neuro: Alert and oriented X 3. Moves all extremities spontaneously. HEENT: Normal  Neck: Supple without bruits or JVD. Lungs:  Resp regular and unlabored, CTA without wheezing or rales. Heart: RRR no s3, s4, 2/6 holosystolic murmur along Apex.  Abdomen: Soft, non-tender, non-distended, BS + x 4.  Extremities: No clubbing or cyanosis. 1+ pitting edema bilaterally. DP/PT/Radials 2+ and equal bilaterally.   EKG:  The EKG was personally reviewed and demonstrates: Sinus tachycardia, HR 106 with LVH and nonspecific IVCD.   Telemetry:  Telemetry was personally reviewed and demonstrates: NSR with intermittent episodes of sinus tachycardia, HR in 90's to low-100's. Occasional PVC's.    Labs/Studies     Relevant CV Studies:  Care Everywhere - Echocardiogram (02/2016) Interpretation Summary A complete portable two-dimensional transthoracic echocardiogram with color flow Doppler and Spectral Doppler was performed. Definity contrast injection performed. Saline contrast injection was performed. The study was technically difficult. The left ventricle is normal in size. There is normal left ventricular wall thickness. Grade II moderate diastolic dysfunction; pseudonormal mitral  inflow pattern. Pulmonary hypertension is not  suggested by Doppler findings. There is anterolateral wall hypokinesis. The left ventricular ejection fraction is markedly reduced (30-35%). There is mild-moderate (1-2+) mitral regurgitation. The interatrial septum is intact with no evidence for an atrial septal defect. Dilated inferior vena cava suggests increased right atrial pressure. Injection of contrast documented no interatrial shunt .  Laboratory Data:  Chemistry Recent Labs  Lab 03/09/19 1536 03/13/19 1056 03/14/19 0605  NA 129* 128* 130*  K 4.7 4.5 4.4  CL 93* 90* 97*  CO2 21* 24 18*  GLUCOSE 122* 82 111*  BUN 28* 37* 34*  CREATININE 0.76 1.17 0.84  CALCIUM 9.2 9.3 8.8*  GFRNONAA >60 >60 >60  GFRAA >60 >60 >60  ANIONGAP 15 14 15     Recent Labs  Lab 03/09/19 1536 03/13/19 1056 03/14/19 0605  PROT 6.8 7.4 6.2*  ALBUMIN 3.6 4.0 3.3*  AST 110* 1,189* 1,500*  ALT 90* 939* 1,187*  ALKPHOS 128* 329* 297*  BILITOT 1.8* 2.7* 3.1*   Hematology Recent Labs  Lab 03/09/19 1536 03/13/19 1056 03/14/19 0605  WBC 11.6* 16.3* 13.8*  RBC 4.63 4.77 4.91  HGB 14.4 14.8 14.9  HCT 44.2 45.7 47.2  MCV 95.5 95.8 96.1  MCH 31.1 31.0 30.3  MCHC 32.6 32.4 31.6  RDW 13.2 13.3 13.2  PLT 305 300 271   Cardiac EnzymesNo results for input(s): TROPONINI in the last 168 hours. No results for input(s): TROPIPOC in the last 168 hours.  BNPNo results for input(s): BNP, PROBNP in the last 168 hours.  DDimer No results for input(s): DDIMER in the last 168 hours.  Radiology/Studies:  CT Head Wo Contrast  Result Date: 03/13/2019 CLINICAL DATA:  Headache EXAM: CT HEAD WITHOUT CONTRAST TECHNIQUE: Contiguous axial images were obtained from the base of the skull through the vertex without intravenous contrast. COMPARISON:  03/09/2019 FINDINGS: Brain: No evidence of acute infarction, hemorrhage, hydrocephalus, extra-axial collection or mass lesion/mass effect. Encephalomalacia related to  right PCA distribution infarct. Additional areas of encephalomalacia in the left frontal and parietal lobes are stable from prior. Scattered low-density changes within the periventricular and subcortical white matter compatible with chronic microvascular ischemic change. Mild diffuse cerebral volume loss. Vascular: Mild atherosclerotic calcifications involving the large vessels of the skull base. No unexpected hyperdense vessel. Skull: Normal. Negative for fracture or focal lesion. Sinuses/Orbits: Sinonasal polyps versus mucous retention cysts in the right maxillary and left sphenoid sinuses. Paranasal sinuses are otherwise clear. Orbital structures unremarkable. Other: None. IMPRESSION: No acute intracranial findings.  Stable exam from 03/09/2019. Electronically Signed   By: Davina Poke D.O.   On: 03/13/2019 11:42   DG Chest Portable 1 View  Result Date: 03/13/2019 CLINICAL DATA:  [place], right rib pain, hypertension, diabetes EXAM: PORTABLE CHEST 1 VIEW COMPARISON:  03/09/2019 FINDINGS: Single frontal view of the chest demonstrates a stable cardiac silhouette. No airspace disease, effusion, or pneumothorax. Chronic right posterior seventh rib fracture. IMPRESSION: No active disease. Electronically Signed   By: Randa Ngo M.D.   On: 03/13/2019 10:59   CT Angio Chest/Abd/Pel for Dissection W and/or Wo Contrast  Result Date: 03/13/2019 CLINICAL DATA:  Abdominal pain, remote fall EXAM: CT ANGIOGRAPHY CHEST, ABDOMEN AND PELVIS TECHNIQUE: Multidetector CT imaging through the chest, abdomen and pelvis was performed using the standard protocol during bolus administration of intravenous contrast. Multiplanar reconstructed images and MIPs were obtained and reviewed to evaluate the vascular anatomy. CONTRAST:  120mL OMNIPAQUE IOHEXOL 350 MG/ML SOLN COMPARISON:  None. FINDINGS: CTA CHEST FINDINGS Cardiovascular: Preferential opacification of the  thoracic aorta. No evidence of thoracic aortic aneurysm or  dissection. Normal heart size. No pericardial effusion. Thoracic aortic atherosclerosis. Multi vessel coronary artery atherosclerosis. Mediastinum/Nodes: No enlarged mediastinal, hilar, or axillary lymph nodes. Thyroid gland, trachea, and esophagus demonstrate no significant findings. Lungs/Pleura: Small bilateral pleural effusions, right greater than left. Right lower lobe patchy airspace disease. No pneumothorax. Musculoskeletal: No chest wall abnormality. No acute or significant osseous findings. Review of the MIP images confirms the above findings. CTA ABDOMEN AND PELVIS FINDINGS VASCULAR Aorta: Normal caliber aorta without aneurysm, dissection, vasculitis or significant stenosis. Abdominal aortic atherosclerosis. Celiac: Patent without evidence of aneurysm, dissection, vasculitis or significant stenosis. SMA: Patent without evidence of aneurysm, dissection, vasculitis or significant stenosis. Renals: Both renal arteries are patent without evidence of aneurysm, dissection, vasculitis, fibromuscular dysplasia or significant stenosis. IMA: Patent without evidence of aneurysm, dissection, vasculitis or significant stenosis. Inflow: Patent without evidence of aneurysm, dissection, vasculitis or significant stenosis. Veins: No obvious venous abnormality within the limitations of this arterial phase study. Review of the MIP images confirms the above findings. NON-VASCULAR Hepatobiliary: No focal liver abnormality is seen. No gallstones, gallbladder wall thickening, or biliary dilatation. Pancreas: Unremarkable. No pancreatic ductal dilatation or surrounding inflammatory changes. Spleen: Normal in size without focal abnormality. Adrenals/Urinary Tract: Adrenal glands are unremarkable. Kidneys are normal, without renal calculi, focal lesion, or hydronephrosis. Bladder is unremarkable. Stomach/Bowel: Stomach is within normal limits. Appendix appears normal. No evidence of bowel wall thickening, distention, or  inflammatory changes. Lymphatic: No lymphadenopathy. Reproductive: Prostate is unremarkable. Other: Small amount of abdominal and pelvic free fluid. Mild anasarca. Musculoskeletal: No acute osseous abnormality. No aggressive osseous lesion. Review of the MIP images confirms the above findings. IMPRESSION: 1. No evidence of an aortic dissection.  No aortic aneurysm. 2.  Aortic Atherosclerosis (ICD10-I70.0). 3. Right lower lobe pneumonia. 4. Small amount of pelvic free fluid. Electronically Signed   By: Elige Ko   On: 03/13/2019 11:55     Assessment & Plan    1. Elevated Troponin Values/ History of Cardiomyopathy - Initial HS Troponin 109 with repeat 105. EKG shows sinus tachycardia, HR 106 with LVH and nonspecific IVCD.  - He denies any known history of CAD but by review of records he did have a cardiomyopathy at the time of echocardiogram imaging in 2018 with EF of 30 to 35%.  Appears he was admitted for a CVA during that timeframe and unclear if possibly a stress-induced cardiomyopathy as further imaging was not obtained by review of records. - Overall, his enzyme elevation is most consistent with demand ischemia in the setting of sepsis and not consistent with ACS. Will plan to obtain a repeat echocardiogram to reassess LV function and wall motion. - He was listed as being on two beta blockers prior to admission which is not appropriate (Atenolol 100mg  daily and Coreg 3.125mg  BID). Would favor restarting Coreg prior to discharge if EF remains reduced. On Lisinopril PTA but currently held given hypotension. Will ultimately require diuresis once BP stabilizes. Would likely favor medical therapy if his cardiomyopathy remains present and can discuss further evaluation as an outpatient.  2. HTN - BP has been soft at 71/35 to 120/67 within the past 24 hours.  PTA medications remain held. He is currently receiving IVF at 100 mL/hr in the setting of sepsis. If EF remains reduced, would reduce IVF rate as  he is + 3.5L this admission.   3. HLD - on Atorvastatin 40mg  daily prior to admission with goal LDL less  than 70 in the setting of prior CVA. Currently held due to elevated LFT's.  4. Sepsis in the setting of PNA - WBC elevated to 16.3 on admission, at 13.8 today. Remains on Cefepime and Vancomycin. Further management per admitting team.   5. Elevated LFT's - AST 1500 and ALT 1187 this AM. Hepatitis Panel pending. GI following.   For questions or updates, please contact [facility] Please consult [email or web address] for contact info under Cardiology/STEMI.  Signed, Ellsworth Lennox, PA-C 03/14/2019, 9:34 AM Pager: [phone]  The patient was seen and examined, and I agree with the history, physical exam, assessment and plan as documented above, with modifications as noted below. I have also personally reviewed all relevant documentation, old records, labs, and both radiographic and cardiovascular studies. I have also independently interpreted old and new ECG's.  Briefly, this is a 64 year old male with a history of cardiomyopathy, CVA, hypertension, insulin-dependent diabetes mellitus, and hyperlipidemia who presented to the ED on 03/09/2019 for increasing generalized weakness and diminished appetite.  He was found to have chronic cerebral infarcts.  I reviewed the CT which also demonstrated mild to moderate severity aortic atherosclerosis and small bilateral pleural effusions and chronic bilateral rib fractures.  He presented back to the ED on 03/13/2019 after being found on the floor by family members.  Labs reviewed above with leukocytosis and nonspecifically elevated high-sensitivity troponins.  CT demonstrated right lower lobe pneumonia and "multivessel coronary artery atherosclerosis ".  He has been hospitalized for sepsis secondary to right lower lobe pneumonia.  LFTs were found to be elevated and GI has evaluated the patient and is felt secondary to ischemic hepatitis.  The patient is  not able to provide much by way of cardiac history.  He thinks he was told he had a heart attack at some time in the past.  He describes intermittent exertional chest pain but cannot tell me the last time this occurred.  He has chronic leg swelling.  He told me "my doctor has me on 2 heart pills ".  He denies use of sublingual nitroglycerin.  He denies orthopnea and paroxysmal nocturnal dyspnea.  He also denies palpitations.  I reviewed cardiac studies performed in 2018 which included a monitor which showed sinus rhythm and occasional PVCs.  I also reviewed the echocardiogram which demonstrated severely reduced LV systolic function, EF 30 to 35%, with anterolateral wall hypokinesis, grade 2 diastolic dysfunction, and mild to moderate mitral regurgitation.  I personally reviewed the ECG which demonstrates sinus tachycardia with old inferolateral and anterior/anteroseptal infarct pattern with nonspecific IVCD.  Recommendations: Given prior history of cardiomyopathy, will obtain a follow-up echocardiogram to assess for interval changes in cardiac structure and function. High-sensitivity troponin elevation is not consistent with an acute coronary syndrome but more consistent with type II MI/demand ischemia in the setting of sepsis and right lower lobe pneumonia. If LVEF remains reduced, I would recommend resuming carvedilol and discontinuing atenolol as he should not be on two beta-blockers.  He has been receiving maintenance IV fluids and will likely require some diuresis once he stabilizes from a sepsis/hemodynamic standpoint.  If diminished LV systolic function has persisted, I will pursue an outpatient ischemic work-up/nuclear stress test.  Prentice Docker, MD, [facility]  03/14/2019 10:13 AM

## 2019-03-14 NOTE — Plan of Care (Signed)
  Problem: Acute Rehab PT Goals(only PT should resolve) Goal: Pt Will Go Supine/Side To Sit Outcome: Progressing Flowsheets (Taken 03/14/2019 1007) Pt will go Supine/Side to Sit: with supervision Goal: Patient Will Transfer Sit To/From Stand Outcome: Progressing Flowsheets (Taken 03/14/2019 1007) Patient will transfer sit to/from stand:  with min guard assist  with minimal assist Goal: Pt Will Transfer Bed To Chair/Chair To Bed Outcome: Progressing Flowsheets (Taken 03/14/2019 1007) Pt will Transfer Bed to Chair/Chair to Bed:  min guard assist  with min assist Goal: Pt Will Ambulate Outcome: Progressing Flowsheets (Taken 03/14/2019 1007) Pt will Ambulate:  25 feet  with minimal assist  with rolling walker   10:08 AM, 03/14/19 Ocie Bob, MPT Physical Therapist with [facility] [phone] office [phone] mobile phone

## 2019-03-14 NOTE — Progress Notes (Signed)
PROGRESS NOTE    Matthew Mccormick  BJS:[id number] DOB: 03-17-1955 DOA: 03/13/2019 PCP: Kirstie Peri, MD    Brief Narrative:  This 64 year old man was admitted yesterday with probable sepsis likely to a right lower lobe pneumonia.  He was found to have significant elevation of liver enzymes and prothrombin time, likely from ischemic hepatitis. He was found to have peripheral cyanosis which was significant in his feet and lower legs. This morning, he feels much improved.   Assessment & Plan:   Active Problems:   Sepsis (HCC)   Pressure injury of skin   1.  Sepsis, likely secondary to right lower lobe pneumonia.  Continue with intravenous antibiotics.  Clinically improving.  He is saturating 100% on room air, his blood pressure has stabilized.  Blood cultures pending.  Repeat chest x-ray. 2.  Peripheral cyanosis.  Significantly improved compared to yesterday.  This was likely a reflection all poor peripheral perfusion from hypotension secondary to sepsis. 3.  Increased liver enzymes with increased prothrombin time.  Today, all enzymes and prothrombin time is worsening.  Alkaline phosphatase actually is improving.  I will follow closely and we will check labs today at 12 noon.  Gastroenterology following.  Continue with IV fluids at a slightly lower rate for  maintenance of systemic blood pressure. 3.  Elevated troponin with abnormal electrocardiogram.  Awaiting cardiology consultation although I suspect there is no acute coronary syndrome here.   DVT prophylaxis: SCDs. Code Status: Full code.  Disposition Plan: At this point, I think he will be ready for discharge in the next 3 to 4 days but I will await physical therapy evaluation to see if he can actually go back home.  I am doubtful of this based on his overall clinical condition.  Certainly, his sepsis appears to be improving from yesterday already but I am concerned about worsening liver enzymes and prothrombin time.   Consultants:    Gastroenterology and cardiology.  Procedures:   None.  Antimicrobials:   IV cefepime and Zosyn.   Subjective: He feels much improved today, no specific complaints.  Objective: Vitals:   03/14/19 0400 03/14/19 0422 03/14/19 0500 03/14/19 0600  BP:  109/80 105/78 110/78  Pulse:  (!) 102 (!) 102 (!) 106  Resp:  20 17 19   Temp: 98.2 F (36.8 C)     TempSrc: Oral     SpO2:  100% 100% 100%  Weight:   107.9 kg   Height:        Intake/Output Summary (Last 24 hours) at 03/14/2019 0731 Last data filed at 03/14/2019 05/12/2019 Gross per 24 hour  Intake 5751.4 ml  Output 2200 ml  Net 3551.4 ml   Filed Weights   03/13/19 0906 03/13/19 1652 03/14/19 0500  Weight: 90.7 kg 107.4 kg 107.9 kg    Examination:  Afebrile.  Hemodynamically stable.  Saturating 100% on room air.  Positive fluid balance.  Lung fields anteriorly are clear.  Alert and orientated.   Data Reviewed: I have personally reviewed following labs and imaging studies  CBC: Recent Labs  Lab 03/09/19 1536 03/13/19 1056 03/14/19 0605  WBC 11.6* 16.3* 13.8*  NEUTROABS 8.8* 13.0*  --   HGB 14.4 14.8 14.9  HCT 44.2 45.7 47.2  MCV 95.5 95.8 96.1  PLT 305 300 271   Basic Metabolic Panel: Recent Labs  Lab 03/09/19 1536 03/13/19 1056 03/14/19 0605  NA 129* 128* 130*  K 4.7 4.5 4.4  CL 93* 90* 97*  CO2 21* 24 18*  GLUCOSE 122* 82 111*  BUN 28* 37* 34*  CREATININE 0.76 1.17 0.84  CALCIUM 9.2 9.3 8.8*   GFR: Estimated Creatinine Clearance: 114.2 mL/min (by C-G formula based on SCr of 0.84 mg/dL). Liver Function Tests: Recent Labs  Lab 03/09/19 1536 03/13/19 1056 03/14/19 0605  AST 110* 1,189* 1,500*  ALT 90* 939* 1,187*  ALKPHOS 128* 329* 297*  BILITOT 1.8* 2.7* 3.1*  PROT 6.8 7.4 6.2*  ALBUMIN 3.6 4.0 3.3*   Recent Labs  Lab 03/09/19 1536  LIPASE 17   No results for input(s): AMMONIA in the last 168 hours. Coagulation Profile: Recent Labs  Lab 03/13/19 1056 03/14/19 0605  INR 1.6* 2.2*    Cardiac Enzymes: Recent Labs  Lab 03/13/19 1056  CKTOTAL 79   BNP (last 3 results) No results for input(s): PROBNP in the last 8760 hours. HbA1C: No results for input(s): HGBA1C in the last 72 hours. CBG: Recent Labs  Lab 03/09/19 1348 03/13/19 1649 03/13/19 2124  GLUCAP 116* 71 80   Lipid Profile: No results for input(s): CHOL, HDL, LDLCALC, TRIG, CHOLHDL, LDLDIRECT in the last 72 hours. Thyroid Function Tests: No results for input(s): TSH, T4TOTAL, FREET4, T3FREE, THYROIDAB in the last 72 hours. Anemia Panel: No results for input(s): VITAMINB12, FOLATE, FERRITIN, TIBC, IRON, RETICCTPCT in the last 72 hours. Sepsis Labs: Recent Labs  Lab 03/13/19 1057 03/13/19 1401  LATICACIDVEN 3.6* 3.4*    Recent Results (from the past 240 hour(s))  Respiratory Panel by RT PCR (Flu A&B, Covid) - Nasopharyngeal Swab     Status: None   Collection Time: 03/13/19 10:58 AM   Specimen: Nasopharyngeal Swab  Result Value Ref Range Status   SARS Coronavirus 2 by RT PCR NEGATIVE NEGATIVE Final    Comment: (NOTE) SARS-CoV-2 target nucleic acids are NOT DETECTED. The SARS-CoV-2 RNA is generally detectable in upper respiratoy specimens during the acute phase of infection. The lowest concentration of SARS-CoV-2 viral copies this assay can detect is 131 copies/mL. A negative result does not preclude SARS-Cov-2 infection and should not be used as the sole basis for treatment or other patient management decisions. A negative result may occur with  improper specimen collection/handling, submission of specimen other than nasopharyngeal swab, presence of viral mutation(s) within the areas targeted by this assay, and inadequate number of viral copies (<131 copies/mL). A negative result must be combined with clinical observations, patient history, and epidemiological information. The expected result is Negative. Fact Sheet for Patients:  https://www.moore.com/ Fact Sheet for  Healthcare Providers:  https://www.young.biz/ This test is not yet ap proved or cleared by the [place] FDA and  has been authorized for detection and/or diagnosis of SARS-CoV-2 by FDA under an Emergency Use Authorization (EUA). This EUA will remain  in effect (meaning this test can be used) for the duration of the COVID-19 declaration under Section 564(b)(1) of the Act, 21 U.S.C. section 360bbb-3(b)(1), unless the authorization is terminated or revoked sooner.    Influenza A by PCR NEGATIVE NEGATIVE Final   Influenza B by PCR NEGATIVE NEGATIVE Final    Comment: (NOTE) The Xpert Xpress SARS-CoV-2/FLU/RSV assay is intended as an aid in  the diagnosis of influenza from Nasopharyngeal swab specimens and  should not be used as a sole basis for treatment. Nasal washings and  aspirates are unacceptable for Xpert Xpress SARS-CoV-2/FLU/RSV  testing. Fact Sheet for Patients: https://www.moore.com/ Fact Sheet for Healthcare Providers: https://www.young.biz/ This test is not yet approved or cleared by the [place] FDA and  has been  authorized for detection and/or diagnosis of SARS-CoV-2 by  FDA under an Emergency Use Authorization (EUA). This EUA will remain  in effect (meaning this test can be used) for the duration of the  Covid-19 declaration under Section 564(b)(1) of the Act, 21  U.S.C. section 360bbb-3(b)(1), unless the authorization is  terminated or revoked. Performed at [facility], [address], [place], [place] [address]   MRSA PCR Screening     Status: None   Collection Time: 03/13/19  4:32 PM   Specimen: Nasal Mucosa; Nasopharyngeal  Result Value Ref Range Status   MRSA by PCR NEGATIVE NEGATIVE Final    Comment:        The GeneXpert MRSA Assay (FDA approved for NASAL specimens only), is one component of a comprehensive MRSA colonization surveillance program. It is not intended to diagnose MRSA infection  nor to guide or monitor treatment for MRSA infections. Performed at [facility], [address], [place], [place] [address]          Radiology Studies: CT Head Wo Contrast  Result Date: 03/13/2019 CLINICAL DATA:  Headache EXAM: CT HEAD WITHOUT CONTRAST TECHNIQUE: Contiguous axial images were obtained from the base of the skull through the vertex without intravenous contrast. COMPARISON:  03/09/2019 FINDINGS: Brain: No evidence of acute infarction, hemorrhage, hydrocephalus, extra-axial collection or mass lesion/mass effect. 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 Additional areas of encephalomalacia in the left frontal and parietal lobes are stable from prior. Scattered low-density changes within the periventricular and subcortical white matter compatible with chronic microvascular ischemic change. Mild diffuse cerebral volume loss. Vascular: Mild atherosclerotic calcifications involving the large vessels of the skull base. No unexpected hyperdense vessel. Skull: Normal. Negative for fracture or focal lesion. Sinuses/Orbits: Sinonasal polyps versus mucous retention cysts in the right maxillary and left sphenoid sinuses. Paranasal sinuses are otherwise clear. Orbital structures unremarkable. Other: None. IMPRESSION: No acute intracranial findings.  Stable exam from 03/09/2019. Electronically Signed   By: Davina Poke D.O.   On: 03/13/2019 11:42   DG Chest Portable 1 View  Result Date: 03/13/2019 CLINICAL DATA:  [place], right rib pain, hypertension, diabetes EXAM: PORTABLE CHEST 1 VIEW COMPARISON:  03/09/2019 FINDINGS: Single frontal view of the chest demonstrates a stable cardiac silhouette. No airspace disease, effusion, or pneumothorax. Chronic right posterior seventh rib fracture. IMPRESSION: No active disease. Electronically Signed   By: Randa Ngo M.D.   On: 03/13/2019 10:59   CT Angio Chest/Abd/Pel for Dissection W and/or Wo Contrast  Result Date: 03/13/2019 CLINICAL  DATA:  Abdominal pain, remote fall EXAM: CT ANGIOGRAPHY CHEST, ABDOMEN AND PELVIS TECHNIQUE: Multidetector CT imaging through the chest, abdomen and pelvis was performed using the standard protocol during bolus administration of intravenous contrast. Multiplanar reconstructed images and MIPs were obtained and reviewed to evaluate the vascular anatomy. CONTRAST:  136mL OMNIPAQUE IOHEXOL 350 MG/ML SOLN COMPARISON:  None. FINDINGS: CTA CHEST FINDINGS Cardiovascular: Preferential opacification of the thoracic aorta. No evidence of thoracic aortic aneurysm or dissection. Normal heart size. No pericardial effusion. Thoracic aortic atherosclerosis. Multi vessel coronary artery atherosclerosis. Mediastinum/Nodes: No enlarged mediastinal, hilar, or axillary lymph nodes. Thyroid gland, trachea, and esophagus demonstrate no significant findings. Lungs/Pleura: Small bilateral pleural effusions, right greater than left. Right lower lobe patchy airspace disease. No pneumothorax. Musculoskeletal: No chest wall abnormality. No acute or significant osseous findings. Review of the MIP images confirms the above findings. CTA ABDOMEN AND PELVIS FINDINGS VASCULAR Aorta: Normal caliber aorta without aneurysm, dissection, vasculitis or significant stenosis. Abdominal aortic  atherosclerosis. Celiac: Patent without evidence of aneurysm, dissection, vasculitis or significant stenosis. SMA: Patent without evidence of aneurysm, dissection, vasculitis or significant stenosis. Renals: Both renal arteries are patent without evidence of aneurysm, dissection, vasculitis, fibromuscular dysplasia or significant stenosis. IMA: Patent without evidence of aneurysm, dissection, vasculitis or significant stenosis. Inflow: Patent without evidence of aneurysm, dissection, vasculitis or significant stenosis. Veins: No obvious venous abnormality within the limitations of this arterial phase study. Review of the MIP images confirms the above findings.  NON-VASCULAR Hepatobiliary: No focal liver abnormality is seen. No gallstones, gallbladder wall thickening, or biliary dilatation. Pancreas: Unremarkable. No pancreatic ductal dilatation or surrounding inflammatory changes. Spleen: Normal in size without focal abnormality. Adrenals/Urinary Tract: Adrenal glands are unremarkable. Kidneys are normal, without renal calculi, focal lesion, or hydronephrosis. Bladder is unremarkable. Stomach/Bowel: Stomach is within normal limits. Appendix appears normal. No evidence of bowel wall thickening, distention, or inflammatory changes. Lymphatic: No lymphadenopathy. Reproductive: Prostate is unremarkable. Other: Small amount of abdominal and pelvic free fluid. Mild anasarca. Musculoskeletal: No acute osseous abnormality. No aggressive osseous lesion. Review of the MIP images confirms the above findings. IMPRESSION: 1. No evidence of an aortic dissection.  No aortic aneurysm. 2.  Aortic Atherosclerosis (ICD10-I70.0). 3. Right lower lobe pneumonia. 4. Small amount of pelvic free fluid. Electronically Signed   By: Elige Ko   On: 03/13/2019 11:55        Scheduled Meds: . Chlorhexidine Gluconate Cloth  6 each Topical Daily  . clopidogrel  75 mg Oral Daily  . mouth rinse  15 mL Mouth Rinse BID   Continuous Infusions: . sodium chloride 125 mL/hr at 03/14/19 0256  . sodium chloride    . ceFEPime (MAXIPIME) IV 2 g (03/14/19 0302)  . vancomycin 1,250 mg (03/14/19 0013)     LOS: 1 day       Cristofher Livecchi Normajean Glasgow, MD [facility]   If 7PM-7AM, please contact night-coverage [email or web address]  03/14/2019, 7:31 AM

## 2019-03-14 NOTE — Consult Note (Addendum)
NAME:  Matthew Mccormick, MRN:  [id number], DOB:  January 17, 1956, LOS: 1 ADMISSION DATE:  03/13/2019, CONSULTATION DATE:  03/14/2019  REFERRING MD:  Dr. Karilyn Cota, [facility], CHIEF COMPLAINT:  Hypotension   Brief History   64 yo male former smoker presented to ER with weakness, low blood sugar, and weeping leg wounds after falling at home.  Found to have Rt lower lobe pneumonia with sepsis and elevated LFTs.  Had persistent hypotension in spite of fluids and PCCM asked to assess.  He remembers falling at home.  He doesn't remember what happened before this.  Has cough.  Notices this more when he is eating or drinking.  He has intermittent chest tightness.  Has been feeling sweaty and nauseous at times.  Past Medical History  DM, HTN, HLD, CVA with Lt side weakness, CKD  Significant Hospital Events   2/02 Admit 2/03 Add pressors  Consults:  GI 2/02 Cardiology 2/03  Procedures:  CVL 2/03 >>   Significant Diagnostic Tests:  CT chest 2/02 >> atherosclerosis, b/l effusions, RLL ASD CT head 2/02 >> encephalomalacia from old Rt PCA infarct Echo 2/03 >> EF 20 to 25%, mild MR, mod elevated in PASP  Micro Data:  Blood 2/02 >> SARS CoV2 PCR 2/02 >> negative Influenza PCR 2/02 >> negative  Antimicrobials:  Rocephin 2/02 >> Zithromax 2/02 >>  Interim history/subjective:    Objective   Blood pressure 124/80, pulse (!) 53, temperature 97.9 F (36.6 C), temperature source Axillary, resp. rate (!) 28, height 6' (1.829 m), weight 107.9 kg, SpO2 97 %.        Intake/Output Summary (Last 24 hours) at 03/14/2019 1326 Last data filed at 03/14/2019 5361 Gross per 24 hour  Intake 5551.4 ml  Output 2200 ml  Net 3351.4 ml   Filed Weights   03/13/19 0906 03/13/19 1652 03/14/19 0500  Weight: 90.7 kg 107.4 kg 107.9 kg    Examination:  General - alert Eyes - pupils reactive ENT - no sinus tenderness, no stridor, garbled speech Cardiac - regular rate/rhythm, no murmur Chest - basilar crackles, Rt >  Lt Abdomen - soft, non tender, + bowel sounds Extremities - no cyanosis, clubbing, or edema Skin - diaphoretic Neuro - follows simple commands, moves extremities   Resolved Hospital Problem list     Assessment & Plan:   Septic shock from Rt lower lobe pneumonia possibly from aspiration. - continue ABx - pressors to keep MAP > 65 - need to be cautious with IV fluids in setting of systolic CHF  Elevated LFTs from hypotension. - viral hepatitis panel negative - GI consulted - f/u LFTs  Acute systolic CHF >> had normal EF from Echo in 2018. Elevated troponin likely from demand ischemia. Hx of HTN, HLD. - cardiology consulted  Hx of CVA. - continue plavix  DM type II poorly controlled. - SSI  Lactic acidosis. - f/u lactic acid  Hyponatremia. - f/u BMET - check TSH, cortisol  Dysphagia. - speech to assess swallowing  Best practice:  Diet: NPO except meds DVT prophylaxis: Lovenox GI prophylaxis: Not indicated Mobility: Bed rest Code Status: full code Disposition: ICU  Labs   CBC: Recent Labs  Lab 03/09/19 1536 03/13/19 1056 03/14/19 0605  WBC 11.6* 16.3* 13.8*  NEUTROABS 8.8* 13.0*  --   HGB 14.4 14.8 14.9  HCT 44.2 45.7 47.2  MCV 95.5 95.8 96.1  PLT 305 300 271    Basic Metabolic Panel: Recent Labs  Lab 03/09/19 1536 03/13/19 1056 03/14/19 0605 03/14/19  1233  NA 129* 128* 130* 124*  K 4.7 4.5 4.4 5.0  CL 93* 90* 97* 94*  CO2 21* 24 18* 16*  GLUCOSE 122* 82 111* 173*  BUN 28* 37* 34* 35*  CREATININE 0.76 1.17 0.84 1.21  CALCIUM 9.2 9.3 8.8* 8.1*   GFR: Estimated Creatinine Clearance: 79.3 mL/min (by C-G formula based on SCr of 1.21 mg/dL). Recent Labs  Lab 03/09/19 1536 03/13/19 1056 03/13/19 1057 03/13/19 1401 03/14/19 0605  WBC 11.6* 16.3*  --   --  13.8*  LATICACIDVEN  --   --  3.6* 3.4*  --     Liver Function Tests: Recent Labs  Lab 03/09/19 1536 03/13/19 1056 03/14/19 0605 03/14/19 1233  AST 110* 1,189* 1,500* 1,488*   ALT 90* 939* 1,187* 1,311*  ALKPHOS 128* 329* 297* 301*  BILITOT 1.8* 2.7* 3.1* 3.2*  PROT 6.8 7.4 6.2* 6.1*  ALBUMIN 3.6 4.0 3.3* 3.3*   Recent Labs  Lab 03/09/19 1536  LIPASE 17   No results for input(s): AMMONIA in the last 168 hours.  ABG No results found for: PHART, PCO2ART, PO2ART, HCO3, TCO2, ACIDBASEDEF, O2SAT   Coagulation Profile: Recent Labs  Lab 03/13/19 1056 03/14/19 0605 03/14/19 1233  INR 1.6* 2.2* 2.6*    Cardiac Enzymes: Recent Labs  Lab 03/13/19 1056  CKTOTAL 79    HbA1C: No results found for: HGBA1C  CBG: Recent Labs  Lab 03/09/19 1348 03/13/19 1649 03/13/19 2124 03/14/19 0748 03/14/19 1116  GLUCAP 116* 71 80 112* 155*    Review of Systems:   Reviewed and negative  Past Medical History  He,  has a past medical history of Abnormality, eye, Chronic kidney disease, Diabetes mellitus without complication (HCC), Hyperlipidemia, Hypertension, Slow rate of speech, and Stroke (HCC).   Surgical History    Past Surgical History:  Procedure Laterality Date  . CATARACT EXTRACTION W/PHACO Right 06/18/2013   Procedure: CATARACT EXTRACTION PHACO AND INTRAOCULAR LENS PLACEMENT (IOC);  Surgeon: Gemma Payor, MD;  Location: [facility];  Service: Ophthalmology;  Laterality: Right;  CDE 34.55  . ORIF ANKLE DISLOCATION Right      Social History   reports that he quit smoking about 27 years ago. His smoking use included cigarettes. He has a 1.00 pack-year smoking history. He has never used smokeless tobacco. He reports that he does not drink alcohol or use drugs.   Family History   His family history is negative for Liver disease.   Allergies No Known Allergies   Home Medications  Prior to Admission medications   Medication Sig Start Date End Date Taking? Authorizing Provider  amLODipine (NORVASC) 10 MG tablet Take 10 mg by mouth daily.   Yes [provider]  aspirin EC 81 MG tablet Take 81 mg by mouth daily.    [provider]   atenolol (TENORMIN) 100 MG tablet Take 100 mg by mouth daily.    [provider]  atorvastatin (LIPITOR) 40 MG tablet Take 40 mg by mouth daily.    [provider]  carvedilol (COREG) 3.125 MG tablet Take 3.125 mg by mouth 2 (two) times daily with a meal.  03/12/16 03/14/19  [provider]  cephALEXin (KEFLEX) 500 MG capsule Take 1 capsule (500 mg total) by mouth 4 (four) times daily. 03/09/19   Geoffery Lyons, MD  clopidogrel (PLAVIX) 75 MG tablet Take 75 mg by mouth daily.     [provider]  empagliflozin (JARDIANCE) 10 MG TABS tablet Take 10 mg by mouth daily.  [provider]  insulin detemir (LEVEMIR) 100 UNIT/ML injection Inject 40 Units into the skin daily.    [provider]  lisinopril (PRINIVIL,ZESTRIL) 10 MG tablet Take 10 mg by mouth daily.    [provider]  metFORMIN (GLUCOPHAGE) 1000 MG tablet Take 1,000 mg by mouth 2 (two) times daily with a meal.    [provider]  potassium chloride (KLOR-CON) 10 MEQ tablet Take 10 mEq by mouth daily. 01/17/19   [provider]     Critical care time: 49 minutes    Chesley Mires, MD [place] 03/14/2019, 1:51 PM

## 2019-03-14 NOTE — Progress Notes (Signed)
*  PRELIMINARY RESULTS* Echocardiogram 2D Echocardiogram has been performed.  Matthew Mccormick 03/14/2019, 11:11 AM

## 2019-03-14 NOTE — Evaluation (Signed)
Physical Therapy Evaluation Patient Details Name: Matthew Mccormick MRN: [id number] DOB: 07-10-1955 Today's Date: 03/14/2019   History of Present Illness  This patient presents to the ER today after having had a fall at home.  He was trying to go to the bathroom and fell between his bed and dresser.  He says he was unable to get up.He was seen in the emergency room a few days ago with weakness.  At that time, he was able to be discharged home although it was seen that his feet were discolored, purplish and pulses were able to be ascertained by Doppler at that time.Overall he is a poor historian but he says he does not feel well overall.He is admitted now for further evaluation of probable sepsis, likely from pneumonia which has been seen on CT scan of the chest with the presence of right lower lobe pneumonia.    Clinical Impression  Patient limited for functional mobility as stated below secondary to BLE weakness, fatigue and poor standing balance.  Patient limited to a few steps at bedside having to use RW secondary to poor standing balance, on room air with SpO2 at 97-100% and tolerated sitting up in chair after therapy.  Patient will benefit from continued physical therapy in hospital and recommended venue below to increase strength, balance, endurance for safe ADLs and gait.     Follow Up Recommendations SNF    Equipment Recommendations  None recommended by PT    Recommendations for Other Services       Precautions / Restrictions Precautions Precautions: Fall Restrictions Weight Bearing Restrictions: No      Mobility  Bed Mobility Overal bed mobility: Needs Assistance Bed Mobility: Supine to Sit     Supine to sit: Min assist     General bed mobility comments: slow labored movement  Transfers Overall transfer level: Needs assistance Equipment used: Rolling walker (2 wheeled) Transfers: Sit to/from [facility] Sit to Stand: Min assist;Mod assist Stand pivot  transfers: Min assist;Mod assist       General transfer comment: very unsteady slow labored movement  Ambulation/Gait Ambulation/Gait assistance: Mod assist Gait Distance (Feet): 4 Feet Assistive device: Rolling walker (2 wheeled) Gait Pattern/deviations: Decreased step length - right;Decreased step length - left;Decreased stride length;Wide base of support Gait velocity: decreased   General Gait Details: limited to 4-5 slow unsteady steps with wide base of suport secondary to weakness, poor standing balance  Stairs            Wheelchair Mobility    Modified Rankin (Stroke Patients Only)       Balance Overall balance assessment: Needs assistance Sitting-balance support: Feet supported;No upper extremity supported Sitting balance-Leahy Scale: Fair Sitting balance - Comments: fair/good seated at EOB   Standing balance support: During functional activity;Bilateral upper extremity supported Standing balance-Leahy Scale: Poor Standing balance comment: fair/poor using RW                             Pertinent Vitals/Pain Pain Assessment: No/denies pain    Home Living Family/patient expects to be discharged to:: Private residence Living Arrangements: Alone Available Help at Discharge: Family;Friend(s);Available PRN/intermittently Type of Home: Apartment Home Access: Ramped entrance     Home Layout: One level Home Equipment: Cane - single point;Shower seat;Wheelchair - manual;Bedside commode;Walker - 2 wheels      Prior Function Level of Independence: Independent with assistive device(s);Needs assistance   Gait / Transfers Assistance Needed: household and  short distanced community ambulator with [facility]  ADL's / Homemaking Assistance Needed: assisted for community ADLs by family/friends        Hand Dominance        Extremity/Trunk Assessment   Upper Extremity Assessment Upper Extremity Assessment: Generalized weakness    Lower Extremity  Assessment Lower Extremity Assessment: Generalized weakness    Cervical / Trunk Assessment Cervical / Trunk Assessment: Kyphotic  Communication   Communication: No difficulties  Cognition Arousal/Alertness: Awake/alert Behavior During Therapy: WFL for tasks assessed/performed Overall Cognitive Status: Within Functional Limits for tasks assessed                                        General Comments      Exercises     Assessment/Plan    PT Assessment Patient needs continued PT services  PT Problem List Decreased strength;Decreased activity tolerance;Decreased mobility;Decreased balance       PT Treatment Interventions Gait training;Stair training;Balance training;Functional mobility training;Therapeutic activities;Therapeutic exercise;Patient/family education    PT Goals (Current goals can be found in the Care Plan section)  Acute Rehab PT Goals Patient Stated Goal: return home after rehab PT Goal Formulation: With patient Time For Goal Achievement: 03/28/19 Potential to Achieve Goals: Good    Frequency Min 3X/week   Barriers to discharge        Co-evaluation               AM-PAC PT "6 Clicks" Mobility  Outcome Measure Help needed turning from your back to your side while in a flat bed without using bedrails?: A Little Help needed moving from lying on your back to sitting on the side of a flat bed without using bedrails?: A Little Help needed moving to and from a bed to a chair (including a wheelchair)?: A Lot Help needed standing up from a chair using your arms (e.g., wheelchair or bedside chair)?: A Lot Help needed to walk in hospital room?: A Lot Help needed climbing 3-5 steps with a railing? : A Lot 6 Click Score: 14    End of Session   Activity Tolerance: Patient tolerated treatment well;Patient limited by fatigue Patient left: in chair;with call bell/phone within reach Nurse Communication: Mobility status PT Visit Diagnosis:  Unsteadiness on feet (R26.81);Other abnormalities of gait and mobility (R26.89);Muscle weakness (generalized) (M62.81)    Time: 9147-8295 PT Time Calculation (min) (ACUTE ONLY): 22 min   Charges:   PT Evaluation $PT Eval Moderate Complexity: 1 Mod PT Treatments $Therapeutic Activity: 23-37 mins        10:06 AM, 03/14/19 Ocie Bob, MPT Physical Therapist with [facility] 336 [phone] office [phone] mobile phone

## 2019-03-14 NOTE — Plan of Care (Signed)
Report called to [facility] ICU. EMS in route.

## 2019-03-14 NOTE — Procedures (Signed)
Arterial Catheter Insertion Procedure Note Matthew Mccormick [id number] [birth year]  Procedure: Insertion of Arterial Catheter  Indications: Blood pressure monitoring and Frequent blood sampling  Procedure Details Consent: Risks of procedure as well as the alternatives and risks of each were explained to the (patient/caregiver).  Consent for procedure obtained. Time Out: Verified patient identification, verified procedure, site/side was marked, verified correct patient position, special equipment/implants available, medications/allergies/relevent history reviewed, required imaging and test results available.  Performed  Maximum sterile technique was used including antiseptics, cap, gloves, gown, hand hygiene, mask and sheet. Skin prep: Chlorhexidine; local anesthetic administered 20 gauge catheter was inserted into right radial artery using the Seldinger technique. ULTRASOUND GUIDANCE USED: NO Evaluation Blood flow good; BP tracing good. Complications: No apparent complications.   Matthew Mccormick 03/14/2019

## 2019-03-15 DIAGNOSIS — I5020 Unspecified systolic (congestive) heart failure: Secondary | ICD-10-CM

## 2019-03-15 DIAGNOSIS — G9341 Metabolic encephalopathy: Secondary | ICD-10-CM

## 2019-03-15 DIAGNOSIS — E872 Acidosis: Secondary | ICD-10-CM

## 2019-03-15 DIAGNOSIS — R570 Cardiogenic shock: Secondary | ICD-10-CM

## 2019-03-15 DIAGNOSIS — Z794 Long term (current) use of insulin: Secondary | ICD-10-CM

## 2019-03-15 DIAGNOSIS — E1151 Type 2 diabetes mellitus with diabetic peripheral angiopathy without gangrene: Secondary | ICD-10-CM

## 2019-03-15 LAB — BASIC METABOLIC PANEL
Anion gap: 13 (ref 5–15)
BUN: 35 mg/dL — ABNORMAL HIGH (ref 8–23)
CO2: 16 mmol/L — ABNORMAL LOW (ref 22–32)
Calcium: 8.5 mg/dL — ABNORMAL LOW (ref 8.9–10.3)
Chloride: 101 mmol/L (ref 98–111)
Creatinine, Ser: 1.17 mg/dL (ref 0.61–1.24)
GFR calc Af Amer: >60 mL/min (ref >60)
GFR calc non Af Amer: >60 mL/min (ref >60)
Glucose, Bld: 137 mg/dL — ABNORMAL HIGH (ref 70–99)
Potassium: 4.4 mmol/L (ref 3.5–5.1)
Sodium: 130 mmol/L — ABNORMAL LOW (ref 135–145)

## 2019-03-15 LAB — COOXEMETRY PANEL
Carboxyhemoglobin: 1.2 % (ref 0.5–1.5)
Carboxyhemoglobin: 1.6 % — ABNORMAL HIGH (ref 0.5–1.5)
Methemoglobin: 1 % (ref 0.0–1.5)
Methemoglobin: 1 % (ref 0.0–1.5)
O2 Saturation: 52.2 %
O2 Saturation: 77.1 %
Total hemoglobin: 15.2 g/dL (ref 12.0–16.0)
Total hemoglobin: 14.5 g/dL (ref 12.0–16.0)

## 2019-03-15 LAB — LACTIC ACID, PLASMA: Lactic Acid, Venous: 3.2 mmol/L (ref 0.5–1.9)

## 2019-03-15 LAB — URINE CULTURE: Culture: NO GROWTH

## 2019-03-15 LAB — MAGNESIUM: Magnesium: 2.1 mg/dL (ref 1.7–2.4)

## 2019-03-15 LAB — GLUCOSE, CAPILLARY
Glucose-Capillary: 103 mg/dL — ABNORMAL HIGH (ref 70–99)
Glucose-Capillary: 105 mg/dL — ABNORMAL HIGH (ref 70–99)
Glucose-Capillary: 106 mg/dL — ABNORMAL HIGH (ref 70–99)
Glucose-Capillary: 124 mg/dL — ABNORMAL HIGH (ref 70–99)
Glucose-Capillary: 136 mg/dL — ABNORMAL HIGH (ref 70–99)
Glucose-Capillary: 143 mg/dL — ABNORMAL HIGH (ref 70–99)
Glucose-Capillary: 95 mg/dL (ref 70–99)
Glucose-Capillary: 99 mg/dL (ref 70–99)

## 2019-03-15 MED ORDER — SODIUM CHLORIDE 0.9 % IV SOLN: INTRAVENOUS | Status: DC | PRN

## 2019-03-15 MED ORDER — FUROSEMIDE 10 MG/ML IJ SOLN
80.0000 mg | Freq: Two times a day (BID) | INTRAMUSCULAR | Status: DC
  Administered 2019-03-15 – 2019-03-16 (×3): 80 mg via INTRAVENOUS
  Filled 2019-03-15 (×3): qty 8

## 2019-03-15 MED ORDER — DOBUTAMINE IN D5W 4-5 MG/ML-% IV SOLN
4.0000 ug/kg/min | INTRAVENOUS | Status: DC
  Administered 2019-03-15: 2.5 ug/kg/min via INTRAVENOUS
  Administered 2019-03-17: 5 ug/kg/min via INTRAVENOUS
  Filled 2019-03-15 (×3): qty 250

## 2019-03-15 MED ORDER — MILRINONE LACTATE IN DEXTROSE 20-5 MG/100ML-% IV SOLN: 0.2500 ug/kg/min | INTRAVENOUS | Status: DC

## 2019-03-15 NOTE — Progress Notes (Addendum)
NAME:  Matthew Mccormick, MRN:  [id number], DOB:  02/19/1955, LOS: 2 ADMISSION DATE:  03/13/2019, CHIEF COMPLAINT:  heart failure  Brief History   64 year old gentleman with a history of atrial fibrillation, diabetes hypertension who presented to [facility] with several days of lethargy malaise nausea vomiting.  Found to be in cardiogenic shock.  Transferred to [facility] for further evaluation.  Consults:  Heart failure  Procedures:  2/3 central line placement 2/3 arterial line placement  Significant Diagnostic Tests:  Echocardiogram from 2/3 1. Left ventricular ejection fraction, by visual estimation, is 20 to  25%. The left ventricle has severely decreased function. There is no left  ventricular hypertrophy.  2. Definity contrast agent was given IV to delineate the left ventricular  endocardial borders.  3. Elevated left ventricular end-diastolic pressure.  4. Indeterminate diastolic filling due to E-A fusion.  5. The left ventricle demonstrates global hypokinesis.  6. Global right ventricle has severely reduced systolic function.The  right ventricular size is mildly enlarged. No increase in right  ventricular wall thickness.  7. Left atrial size was normal.  8. Right atrial size was mildly dilated.  9. Mild mitral annular calcification.  10. The mitral valve is grossly normal. Mild mitral valve regurgitation.  11. The tricuspid valve is normal in structure.  12. The tricuspid valve is normal in structure. Tricuspid valve  regurgitation is mild.  13. The aortic valve is tricuspid. Aortic valve regurgitation is not  visualized. No evidence of aortic valve sclerosis or stenosis.  14. The pulmonic valve was grossly normal. Pulmonic valve regurgitation is  not visualized.  15. Moderately elevated pulmonary artery systolic pressure.  16. The inferior vena cava is dilated in size with >50% respiratory  variability, suggesting right atrial pressure of 8 mmHg.    Micro Data:  Urine cultures negative, blood cultures negative, MRSA swab negative, SARS Covid 2 - and flu negative from admission on 2/2  Antimicrobials:  Was on ceftriaxone and azithromycin empirically for pneumonia  Interim history/subjective:  ScVO2 60-70. Off norepi. Retaining urine overnight, required straight catheterization for over 1500 cc of urine output.  This morning he is awake, alert, but not following commands and not oriented fully.  Objective   Blood pressure 132/80, pulse (!) 106, temperature 98.7 F (37.1 C), temperature source Axillary, resp. rate 19, height 6' (1.829 m), weight 107.9 kg, SpO2 100 %. CVP:  [10 mmHg-18 mmHg] 10 mmHg      Intake/Output Summary (Last 24 hours) at 03/15/2019 0943 Last data filed at 03/15/2019 0900 Gross per 24 hour  Intake 3847.14 ml  Output 1440 ml  Net 2407.14 ml   Filed Weights   03/13/19 0906 03/13/19 1652 03/14/19 0500  Weight: 90.7 kg 107.4 kg 107.9 kg    Examination: General: Lethargic, but arousable HENT: Large neck, unable to appreciate JVD Lungs: Diminished, clear, Cardiovascular: Tachycardic, regular, frequent PVCs Abdomen: Distended, soft Extremities: 2+ pitting lower extremity edema with  Neuro: Moves all 4 extremities, intermittently follows commands, delirious MSK: chronic venous stasis changes Lines: subclavian CVC, arterial line  Assessment & Plan:   [name] is a 64 year old gentleman who presents with:   Cardiogenic shock He is cold and wet on exam. Will diurese with IV lasix 80 mg IV BID Heart failure consultation Monitor off abx His SCVO2 is 52 off inotropic support this morning Given that his cultures are negative, and there is no focal consolidation on his chest x-ray for convincing pneumonia, I am comfortable monitoring  him off antibiotics at this time.  Acute metabolic encephalopathy Secondary to hypoperfusion cardiogenic shock  Acute Hypoxemic respiratory failure Secondary to acute  pulmonary edema We will diurese as above  Hypertension Holding his home antihypertensives in the setting of shock  Type 2 diabetes mellitus Holding his oral hypoglycemic agents Monitoring his blood sugars, will give sliding scale insulin He is on Lantus at home which we are currently holding, will hold back and gradually if his blood sugars climb up  History of CVA On Plavix  Transaminitis Likely congestive hepatopathy, avoid hepatotoxic agents   Best practice:  Diet: N.p.o. for now, he is encephalopathic and speech evaluation. Pain/Anxiety/Delirium protocol (if indicated): Hold all sedating medications for encephalopathy VAP protocol (if indicated): Not indicated DVT prophylaxis: Subcu heparin GI prophylaxis: Not indicated Glucose control: Controlled, continue to monitor Foley will insert for cardiogenic shock, accurate ins and outs, and urinary retention Mobility: With assist only Code Status: Full Disposition: Will remain in ICU, will discuss with heart failure team if he needs a trial of inotropic support  Labs   CBC: Recent Labs  Lab 03/09/19 1536 03/13/19 1056 03/14/19 0605  WBC 11.6* 16.3* 13.8*  NEUTROABS 8.8* 13.0*  --   HGB 14.4 14.8 14.9  HCT 44.2 45.7 47.2  MCV 95.5 95.8 96.1  PLT 305 300 093    Basic Metabolic Panel: Recent Labs  Lab 03/09/19 1536 03/13/19 1056 03/14/19 0605 03/14/19 1233 03/15/19 0243 03/15/19 0415  NA 129* 128* 130* 124* 130*  --   K 4.7 4.5 4.4 5.0 4.4  --   CL 93* 90* 97* 94* 101  --   CO2 21* 24 18* 16* 16*  --   GLUCOSE 122* 82 111* 173* 137*  --   BUN 28* 37* 34* 35* 35*  --   CREATININE 0.76 1.17 0.84 1.21 1.17  --   CALCIUM 9.2 9.3 8.8* 8.1* 8.5*  --   MG  --   --   --   --   --  2.1   GFR: Estimated Creatinine Clearance: 82 mL/min (by C-G formula based on SCr of 1.17 mg/dL). Recent Labs  Lab 03/09/19 1536 03/13/19 1056 03/13/19 1057 03/13/19 1401 03/14/19 0605 03/14/19 1546 03/15/19 0203  WBC 11.6*  16.3*  --   --  13.8*  --   --   LATICACIDVEN  --   --  3.6* 3.4*  --  4.4* 3.2*    Liver Function Tests: Recent Labs  Lab 03/09/19 1536 03/13/19 1056 03/14/19 0605 03/14/19 1233  AST 110* 1,189* 1,500* 1,488*  ALT 90* 939* 1,187* 1,311*  ALKPHOS 128* 329* 297* 301*  BILITOT 1.8* 2.7* 3.1* 3.2*  PROT 6.8 7.4 6.2* 6.1*  ALBUMIN 3.6 4.0 3.3* 3.3*   Recent Labs  Lab 03/09/19 1536  LIPASE 17   No results for input(s): AMMONIA in the last 168 hours.  ABG    Component Value Date/Time   O2SAT 60.0 03/14/2019 2340     Coagulation Profile: Recent Labs  Lab 03/13/19 1056 03/14/19 0605 03/14/19 1233  INR 1.6* 2.2* 2.6*    Cardiac Enzymes: Recent Labs  Lab 03/13/19 1056  CKTOTAL 79    HbA1C: Hgb A1c MFr Bld  Date/Time Value Ref Range Status  03/14/2019 03:06 PM 6.3 (H) 4.8 - 5.6 % Final    Comment:    (NOTE) Pre diabetes:          5.7%-6.4% Diabetes:              >  6.4% Glycemic control for   <7.0% adults with diabetes     CBG: Recent Labs  Lab 03/14/19 1947 03/14/19 2357 03/15/19 0146 03/15/19 0406 03/15/19 0745  GLUCAP 165* 140* 143* 136* 124*    Critical care time:   The patient is critically ill with multiple organ systems failure and requires high complexity decision making for assessment and support, frequent evaluation and titration of therapies, application of advanced monitoring technologies and extensive interpretation of multiple databases.   Critical Care Time devoted to patient care services described in this note is 45 minutes. This time reflects the time of my personal involvement. This critical care time does not reflect separately billable procedures or procedure time, teaching time or supervisory time of PA/NP/Med student/Med Resident etc but could involve care discussion time.  Mickel Baas Pulmonary and Critical Care Medicine 03/15/2019 9:43 AM  Pager: [phone] After hours pager: [phone]

## 2019-03-15 NOTE — Consult Note (Signed)
WOC Nurse Consult Note: Patient receiving care in [facility] 2M07. Reason for Consult: leg wounds Wound type: intact and ruptured bullae to BLE in the gaiter area Pressure Injury POA: Yes/No/NA Measurement: NA Wound bed: 100% pink, moist Drainage (amount, consistency, odor) serous on existing kerlex wrap Periwound: edematous, erythematous, fragile Dressing procedure/placement/frequency: Xeroform over wounds/fluid filled pockets to BLE, top with foam dressings.  Change Xeroform daily, foam dressings can remain in place up to 3 days. Additional care intervention includes bilateral Prevalon heel lift boots.  Patient has some toes on both feet that are dusky in color and edematous. Monitor the wound area(s) for worsening of condition such as: Signs/symptoms of infection,  Increase in size,  Development of or worsening of odor, Development of pain, or increased pain at the affected locations.  Notify the medical team if any of these develop.  Thank you for the consult.  Discussed plan of care with the patient and bedside nurse.  WOC nurse will not follow at this time.  Please re-consult the WOC team if needed.  Helmut Muster, RN, MSN, CWOCN, CNS-BC, pager [phone]

## 2019-03-15 NOTE — Progress Notes (Signed)
I just received a call from the patient's cousin, Ebbie Latus (his said HCPOA & next closest to kin). Dinah's # is [phone]. I updated her as much as possible from a nursing stand point. [name] will visit the patient tomorrow around 1100-1130. Dinah would like to speak to a physician for further information as she has not been updated since the patient left [facility].   Darrold Span, RN

## 2019-03-15 NOTE — Progress Notes (Signed)
eLink Physician-Brief Progress Note Patient Name: Matthew Mccormick DOB: 1955-09-20 MRN: [id number]   Date of Service  03/15/2019  HPI/Events of Note  Oliguria - Urinary retention. Bladder residual = 560 mL.  eICU Interventions  Will order: 1. I/O Cath PRN.     Intervention Category Intermediate Interventions: Oliguria - evaluation and management  Suraya Vidrine Eugene 03/15/2019, 3:37 AM

## 2019-03-15 NOTE — Consult Note (Addendum)
Advanced Heart Failure Team Consult Note   Primary Physician: Kirstie Peri, MD PCP-Cardiologist:  Prentice Docker, MD  Reason for Consultation: Cardiogenic Shock   HPI:    Matthew Mccormick is seen today for evaluation of cardiogenic shock at the request of Dr Ovid Curd.  Matthew Mccormick is a 64 year old with history of HTN, hyperlipidemia, DMII, and CVA with left hemiparesis.  In 2018 had ECHO at [facility] with EF 30-35% and normal RV>   Presented to [facility] on 1/29 with weakness and fatigue. AST 110, ALT 90, WBC 11.6.CT of head negative for acute findings but did have chronic infarcts right occipital lobe, left frontal lobe and posterior parietal region on the left. Matthew Mccormick was noted to have cool extremities with purplish color with blisters. Able to doppler pulses at that time. Dopplers negative for DVT. Discharged to home the same day on keflex for cellulitis.   Matthew Mccormick returned to [facility] on 2/2 after being found on the floor by his family. Matthew Mccormick did not lose consciousness but could not get off the floor for some time. Lives alone. CXR was ok. CTA concerning for RLL pneumonia.  CT head - no acute findings. Hypotensive in the ED. Lactic acid was 3.6, WBC 16, AST 1189, ALT 930, HS Trop 109>105, COVID negative. Blood Cultures obtained and started on antibiotics. GI consulted for elevated LFTs. Hepatitis panel negative. Placed on IV fluids for hypotension with no improvement. CCM consulted for septic shock. Cardiology consulted for elevated HS Trop and low EF. Not felt to be acute coronary syndrome. Echo this admit - EF < 15% with severe RV dysfunction.   Yesterday Matthew Mccormick was transferred to [facility] for shock management. Placed on IV lasix for volume overload. Sluggish diuresis noted.  Todays CO-OX 52%.    03/13/19 Blood CX: NGTD  Review of Systems: [y] = yes, [ ]  = no History obtained from chart due to shock.   . General: Weight gain [ ] ; Weight loss [ ] ; Anorexia [ ] ; Fatigue [Y ]; Fever [ ] ; Chills [ ] ; Weakness [Y ]   . Cardiac: Chest pain/pressure [ ] ; Resting SOB [ ] ; Exertional SOB [Y ]; Orthopnea [ ] ; Pedal Edema [Y ]; Palpitations [ ] ; Syncope [ ] ; Presyncope [ ] ; Paroxysmal nocturnal dyspnea[ ]   . Pulmonary: Cough [ ] ; Wheezing[ ] ; Hemoptysis[ ] ; Sputum [ ] ; Snoring [ ]   . GI: Vomiting[ ] ; Dysphagia[ ] ; Melena[ ] ; Hematochezia [ ] ; Heartburn[ ] ; Abdominal pain [ ] ; Constipation [ ] ; Diarrhea [ ] ; BRBPR [ ]   . GU: Hematuria[ ] ; Dysuria [ ] ; Nocturia[ ]   . Vascular: Pain in legs with walking [ ] ; Pain in feet with lying flat [ ] ; Non-healing sores [ ] ; Stroke [ ] ; TIA [ ] ; Slurred speech [ ] ;  . Neuro: Headaches[ ] ; Vertigo[ ] ; Seizures[ ] ; Paresthesias[ ] ;Blurred vision [ ] ; Diplopia [ ] ; Vision changes [ ]   . Ortho/Skin: Arthritis [ ] ; Joint pain [ Y]; Muscle pain [ ] ; Joint swelling [ ] ; Back Pain [ ] ; Rash [ ]   . Psych: Depression[ ] ; Anxiety[ ]   . Heme: Bleeding problems [ ] ; Clotting disorders [ ] ; Anemia [ ]   . Endocrine: Diabetes [Y ]; Thyroid dysfunction[ ]   Home Medications Prior to Admission medications   Medication Sig Start Date End Date Taking? Authorizing Provider  amLODipine (NORVASC) 10 MG tablet Take 10 mg by mouth daily.   Yes [provider]  aspirin EC 81 MG tablet Take 81 mg by mouth  daily.   Yes [provider]  atenolol (TENORMIN) 100 MG tablet Take 100 mg by mouth daily.   Yes [provider]  atorvastatin (LIPITOR) 40 MG tablet Take 40 mg by mouth daily.   Yes [provider]  carvedilol (COREG) 3.125 MG tablet Take 3.125 mg by mouth 2 (two) times daily with a meal.  03/12/16 03/14/19 Yes [provider]  cephALEXin (KEFLEX) 500 MG capsule Take 1 capsule (500 mg total) by mouth 4 (four) times daily. 03/09/19  Yes Delo, Nathaneil Canary, MD  clopidogrel (PLAVIX) 75 MG tablet Take 75 mg by mouth daily.    Yes [provider]  empagliflozin (JARDIANCE) 10 MG TABS tablet Take 10 mg by mouth daily.    Yes [provider]  insulin  detemir (LEVEMIR) 100 UNIT/ML injection Inject 40 Units into the skin daily.   Yes [provider]  lisinopril (PRINIVIL,ZESTRIL) 10 MG tablet Take 10 mg by mouth daily.   Yes [provider]  metFORMIN (GLUCOPHAGE) 1000 MG tablet Take 1,000 mg by mouth 2 (two) times daily with a meal.   Yes [provider]  potassium chloride (KLOR-CON) 10 MEQ tablet Take 10 mEq by mouth daily. 01/17/19  Yes [provider]    Past Medical History: Past Medical History:  Diagnosis Date  . 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 Chronic kidney disease   . Diabetes mellitus without complication ([place])   . Hyperlipidemia   . Hypertension   . Slow rate of speech   . Stroke [facility])    1 year ago-left and shaky and unsteady on feet.    Past Surgical History: Past Surgical History:  Procedure Laterality Date  . CATARACT EXTRACTION W/PHACO Right 06/18/2013   Procedure: CATARACT EXTRACTION PHACO AND INTRAOCULAR LENS PLACEMENT (IOC);  Surgeon: Tonny Branch, MD;  Location: [facility];  Service: Ophthalmology;  Laterality: Right;  CDE 34.55  . ORIF ANKLE DISLOCATION Right     Family History: Family History  Problem Relation Age of Onset  . Liver disease Neg Hx     Social History: Social History   Socioeconomic History  . Marital status: Single    Spouse name: Not on file  . Number of children: Not on file  . Years of education: Not on file  . Highest education level: Not on file  Occupational History  . Not on file  Tobacco Use  . Smoking status: Former Smoker    Packs/day: 1.00    Years: 1.00    Pack years: 1.00    Types: Cigarettes    Quit date: 06/16/1991    Years since quitting: 27.7  . Smokeless tobacco: Never Used  Substance and Sexual Activity  . Alcohol use: No  . Drug use: No  . Sexual activity: Yes    Birth control/protection: None  Other Topics Concern  . Not on file  Social History Narrative   Pt lives alone in 1 story home   8th  grade education   Has home health aid 6days/week   Social Determinants of Health   Financial Resource Strain:   . Difficulty of Paying Living Expenses: Not on file  Food Insecurity:   . Worried About Charity fundraiser in the Last Year: Not on file  . Ran Out of Food in the Last Year: Not on file  Transportation Needs:   . Lack of Transportation (Medical): Not on file  . Lack of Transportation (Non-Medical): Not on file  Physical Activity:   . Days of Exercise per Week: Not on file  . Minutes of Exercise per Session: Not on file  Stress:   . Feeling of Stress : Not on file  Social Connections:   . Frequency of Communication with Friends and Family: Not on file  . Frequency of Social Gatherings with Friends and Family: Not on file  . Attends Religious Services: Not on file  . Active Member of Clubs or Organizations: Not on file  . Attends Banker Meetings: Not on file  . Marital Status: Not on file    Allergies:  No Known Allergies  Objective:    Vital Signs:   Temp:  [96.6 F (35.9 C)-98.7 F (37.1 C)] 98.7 F (37.1 C) (02/04 0748) Pulse Rate:  [36-136] 107 (02/04 1000) Resp:  [15-28] 22 (02/04 1000) BP: (50-154)/(22-125) 103/81 (02/04 1000) SpO2:  [68 %-100 %] 100 % (02/04 1000) Arterial Line BP: (109-126)/(68-79) 124/78 (02/04 1000)    Weight change: Filed Weights   03/13/19 0906 03/13/19 1652 03/14/19 0500  Weight: 90.7 kg 107.4 kg 107.9 kg    Intake/Output:   Intake/Output Summary (Last 24 hours) at 03/15/2019 1041 Last data filed at 03/15/2019 1028 Gross per 24 hour  Intake 3958.39 ml  Output 1775 ml  Net 2183.39 ml      Physical Exam   CVP 13-14 personally checked General:  Appears fatigued. No resp difficulty HEENT: normal Neck: supple. JVP 12-13 . Carotids 2+ bilat; no bruits. No lymphadenopathy or thyromegaly appreciated. Cor: PMI nondisplaced. Regular rate & rhythm. No rubs or murmurs. + S3  Lungs: Decreased in the bases.   Abdomen: soft, nontender, nondistended. No hepatosplenomegaly. No bruits or masses. Good bowel sounds. Extremities: no cyanosis, clubbing, rash, R and LLE 2+ edema. Unable to doppler pedal pulse. Able to doppler posterior tib pulses.  Neuro: alert & orientedx1, not following commands. Moves all 4 extremities w/o difficulty. Affect flat GU:  foley    Telemetry   Sinus Tach with PVCs 110s   EKG   Sinus tach 102 bpm   Labs   Basic Metabolic Panel: Recent Labs  Lab 03/09/19 1536 03/09/19 1536 03/13/19 1056 03/13/19 1056 03/14/19 0605 03/14/19 1233 03/15/19 0243 03/15/19 0415  NA 129*  --  128*  --  130* 124* 130*  --   K 4.7  --  4.5  --  4.4 5.0 4.4  --   CL 93*  --  90*  --  97* 94* 101  --   CO2 21*  --  24  --  18* 16* 16*  --   GLUCOSE 122*  --  82  --  111* 173* 137*  --   BUN 28*  --  37*  --  34* 35* 35*  --   CREATININE 0.76  --  1.17  --  0.84 1.21 1.17  --   CALCIUM 9.2   < > 9.3   < > 8.8* 8.1* 8.5*  --   MG  --   --   --   --   --   --   --  2.1   < > = values in this interval not displayed.    Liver Function Tests: Recent Labs  Lab 03/09/19 1536 03/13/19 1056 03/14/19 0605 03/14/19 1233  AST 110* 1,189* 1,500* 1,488*  ALT 90* 939* 1,187* 1,311*  ALKPHOS 128* 329* 297* 301*  BILITOT 1.8* 2.7* 3.1* 3.2*  PROT 6.8 7.4 6.2* 6.1*  ALBUMIN 3.6  4.0 3.3* 3.3*   Recent Labs  Lab 03/09/19 1536  LIPASE 17   No results for input(s): AMMONIA in the last 168 hours.  CBC: Recent Labs  Lab 03/09/19 1536 03/13/19 1056 03/14/19 0605  WBC 11.6* 16.3* 13.8*  NEUTROABS 8.8* 13.0*  --   HGB 14.4 14.8 14.9  HCT 44.2 45.7 47.2  MCV 95.5 95.8 96.1  PLT 305 300 271    Cardiac Enzymes: Recent Labs  Lab 03/13/19 1056  CKTOTAL 79    BNP: BNP (last 3 results) Recent Labs    03/14/19 1832  BNP 1,338.6*    ProBNP (last 3 results) No results for input(s): PROBNP in the last 8760 hours.   CBG: Recent Labs  Lab 03/14/19 1947 03/14/19 2357  03/15/19 0146 03/15/19 0406 03/15/19 0745  GLUCAP 165* 140* 143* 136* 124*    Coagulation Studies: Recent Labs    03/13/19 1056 03/14/19 0605 03/14/19 1233  LABPROT 19.0* 24.2* 27.7*  INR 1.6* 2.2* 2.6*     Imaging   DG Chest 1V REPEAT Same Day  Result Date: 03/14/2019 CLINICAL DATA:  Left central line placement, history of diabetes, previous tobacco abuse EXAM: CHEST - 1 VIEW SAME DAY COMPARISON:  03/14/2019 FINDINGS: Frontal view of the chest demonstrates left-sided central venous catheter tip overlying brachiocephalic confluence. Cardiac silhouette is unremarkable. The bibasilar ground-glass airspace disease and small bilateral pleural effusion seen on prior chest CT are less apparent by radiograph. No acute bony abnormalities. IMPRESSION: 1. No complication after left-sided central line placement. 2. Otherwise stable exam. Electronically Signed   By: Sharlet Salina M.D.   On: 03/14/2019 15:10   ECHOCARDIOGRAM COMPLETE  Result Date: 03/14/2019   ECHOCARDIOGRAM REPORT   Patient Name:   SAYF KERNER Date of Exam: 03/14/2019 Medical Rec #:  [id number]      Height:       72.0 in Accession #:    [id number]     Weight:       237.9 lb Date of Birth:  [birth year]       BSA:          2.29 m Patient Age:    63 years       BP:           88/72 mmHg Patient Gender: M              HR:           110 bpm. [facility] Procedure: 2D Echo Indications:    Cardiomyopathy-Unspecified 425.9 / I42.9                 Elevated Troponin  History:        Patient has no prior history of Echocardiogram examinations.                 Stroke, Arrythmias:Atrial Fibrillation; Risk Factors:Former                 Smoker, Dyslipidemia and Diabetes. Sepsis.  Sonographer:    Jeryl Columbia RDCS (AE) Referring Phys: [id number] Ellsworth Lennox IMPRESSIONS  1. Left ventricular ejection fraction, by visual estimation, is 20 to 25%. The left ventricle has severely decreased function. There is no left ventricular  hypertrophy.  2. Definity contrast agent was given IV to delineate the left ventricular endocardial borders.  3. Elevated left ventricular end-diastolic pressure.  4. Indeterminate diastolic filling due to E-A fusion.  5. The left ventricle demonstrates global hypokinesis.  6. Global right  ventricle has severely reduced systolic function.The right ventricular size is mildly enlarged. No increase in right ventricular wall thickness.  7. Left atrial size was normal.  8. Right atrial size was mildly dilated.  9. Mild mitral annular calcification. 10. The mitral valve is grossly normal. Mild mitral valve regurgitation. 11. The tricuspid valve is normal in structure. 12. The tricuspid valve is normal in structure. Tricuspid valve regurgitation is mild. 13. The aortic valve is tricuspid. Aortic valve regurgitation is not visualized. No evidence of aortic valve sclerosis or stenosis. 14. The pulmonic valve was grossly normal. Pulmonic valve regurgitation is not visualized. 15. Moderately elevated pulmonary artery systolic pressure. 16. The inferior vena cava is dilated in size with >50% respiratory variability, suggesting right atrial pressure of 8 mmHg. FINDINGS  Left Ventricle: Left ventricular ejection fraction, by visual estimation, is 20 to 25%. The left ventricle has severely decreased function. Definity contrast agent was given IV to delineate the left ventricular endocardial borders. The left ventricle demonstrates global hypokinesis. There is no left ventricular hypertrophy. Indeterminate diastolic filling due to E-A fusion. Elevated left ventricular end-diastolic pressure. Right Ventricle: The right ventricular size is mildly enlarged. No increase in right ventricular wall thickness. Global RV systolic function is has severely reduced systolic function. The tricuspid regurgitant velocity is 3.24 m/s, and with an assumed right atrial pressure of 10 mmHg, the estimated right ventricular systolic pressure is  moderately elevated at 52.0 mmHg. Left Atrium: Left atrial size was normal in size. Right Atrium: Right atrial size was mildly dilated Pericardium: There is no evidence of pericardial effusion. Mitral Valve: The mitral valve is grossly normal. Mildly decreased mobility of the mitral valve leaflets. Mild mitral annular calcification. Mild mitral valve regurgitation. Tricuspid Valve: The tricuspid valve is normal in structure. Tricuspid valve regurgitation is mild. Aortic Valve: The aortic valve is tricuspid. Aortic valve regurgitation is not visualized. The aortic valve is structurally normal, with no evidence of sclerosis or stenosis. Pulmonic Valve: The pulmonic valve was grossly normal. Pulmonic valve regurgitation is not visualized. Pulmonic regurgitation is not visualized. Aorta: The aortic root is normal in size and structure. Venous: The inferior vena cava is dilated in size with greater than 50% respiratory variability, suggesting right atrial pressure of 8 mmHg. IAS/Shunts: No atrial level shunt detected by color flow Doppler.  LEFT VENTRICLE PLAX 2D LVIDd:         4.80 cm  Diastology LVIDs:         4.25 cm  LV e' lateral:   6.50 cm/s LV PW:         1.16 cm  LV E/e' lateral: 10.4 LV IVS:        1.06 cm  LV e' medial:    4.31 cm/s LVOT diam:     2.00 cm  LV E/e' medial:  15.8 LV SV:         27 ml LV SV Index:   11.26 LVOT Area:     3.14 cm  RIGHT VENTRICLE RV S prime:     4.62 cm/s TAPSE (M-mode): 0.8 cm LEFT ATRIUM             Index       RIGHT ATRIUM           Index LA diam:        4.90 cm 2.14 cm/m  RA Area:     19.60 cm LA Vol (A2C):   72.7 ml 31.70 ml/m RA Volume:   67.70 ml  29.52  ml/m LA Vol (A4C):   64.7 ml 28.21 ml/m LA Biplane Vol: 70.2 ml 30.61 ml/m   AORTA Ao Root diam: 2.80 cm MITRAL VALVE                        TRICUSPID VALVE MV Area (PHT): 4.21 cm             TR Peak grad:   42.0 mmHg MV PHT:        52.20 msec           TR Vmax:        324.00 cm/s MV Decel Time: 180 msec Matthew Peak grad:  58.1 mmHg             SHUNTS Matthew Mean grad: 41.0 mmHg             Systemic Diam: 2.00 cm Matthew Vmax:      381.00 cm/s Matthew Vmean:     304.0 cm/s MV E velocity: 67.90 cm/s 103 cm/s MV A velocity: 20.90 cm/s 70.3 cm/s MV E/A ratio:  3.25       1.5  Prentice Docker MD Electronically signed by Prentice Docker MD Signature Date/Time: 03/14/2019/11:15:54 AM    Final       Medications:     Current Medications: . Chlorhexidine Gluconate Cloth  6 each Topical Daily  . clopidogrel  75 mg Oral Daily  . enoxaparin (LOVENOX) injection  55 mg Subcutaneous Q24H  . furosemide  80 mg Intravenous BID  . insulin aspart  0-15 Units Subcutaneous Q4H  . mouth rinse  15 mL Mouth Rinse BID     Infusions: . norepinephrine (LEVOPHED) Adult infusion 3 mcg/min (03/14/19 1637)       Assessment/Plan   1. Shock Septic/Cardiogenic-  PNA ? Lower extremity cellulitis ?  - Lactic Acid 3.4>4.4>3.2 - Blood Cx pending -UA negative  - EF severely reduced 15% with RV dysfunction  - SBP remains soft. CO-OX 52%. Add dobutamine 2.5 mcg. May need to add norepi back.   2. Acute Systolic Heart Failure, Biventricular Heart Failure   - In 2018 Matthew Mccormick had an ECHO at [facility] with EF 50-55% and normal RV.  -ECHO this admit  EF < 15 % with severe RV dysfunction - Appears cold and wet. Tachycardic with S3 - I dont see ischemic work up in care everywhere. If stabilizes would benefit from cath to further assess. HS Trop 109>105  - CVP 14. Continue IV lasix 80 mg twice a day.  - As above add 2.5 mcg dobutamine.  - No bb , arb, spiro for now with shock.   3. Elevated LFTs Shock -AST 1189>1500>1488 -ALT [phone] -Hepatitis panel negative  4. Hyponatremia  -Sodium 124  - Per CCM   5. DMII On SSI   6. CVA, 2014   Length of Stay: 2  Matthew Becket, NP  03/15/2019, 10:41 AM  Advanced Heart Failure Team Pager [phone] (M-F; 7a - 4p)  Please contact [facility] for night-coverage after hours (4p -7a ) and weekends on  [email or web address]  Patient seen with NP, agree with the above note.    At baseline, patient has history of prior CVA with left-sided weakness and some cognitive difficulty.  Matthew Mccormick had an echo from 1/18 with EF 30-35% (not in our system). Matthew Mccormick was admitted to [facility] on 2/2 with weakness/fatigue, CTA chest showed no PE but there was RLL PNA. COVID-19 negative. Elevated LFTs and lactate also noted.  Patient  required norepinephrine last night, but now off.  Echo was done, showing EF down to 10-15% with severe RV dysfunction.  ECG showed anterior and inferior Qs, sinus tachy.    Currently off pressors with CVP 13-14, co-ox 52%.    General: NAD Neck: No JVD, no thyromegaly or thyroid nodule.  Lungs: Clear to auscultation bilaterally with normal respiratory effort. CV: Nondisplaced PMI.  Heart mildly tachycardic, regular S1/S2, no S3/S4, no murmur.  1+ edema to knees.  No carotid bruit.  Normal pedal pulses.  Abdomen: Soft, nontender, no hepatosplenomegaly, no distention.  Skin: Intact without lesions or rashes.  Neurologic: Mildly lethargic.  Psych: Normal affect. Extremities: No clubbing or cyanosis.  HEENT: Normal.   1. Shock: Suspect primarily cardiogenic shock.  Prior echo in 2018 with EF 30-35%, now echo with EF down to 10-15% with severe RV dysfunction. Cause of cardiomyopathy uncertain though ECG is suggestive of prior MI with anterior Qs.  No ACS this admission (HS-TnI in the 100s range with no trend). Matthew Mccormick is now off norepinephrine but co-ox low at 52% with CVP 13-14. Lactate has been elevated.  Matthew Mccormick is afebrile, WBCs mildly elevated. Creatinine stable at 1.17.  - Start dobutamine 3 mcg/kg/min, repeat co-ox.  - Lasix 80 mg IV bid.  - Will need eventual RHC/LHC.  2. ID: Initial concern for RLL PNA on CT chest with septic shock. However, afebrile and WBCs only mildly elevated. Cultures negative so far.  - Matthew Mccormick is currently off abx (per CCM).  - Would send PCT.  3. Elevated LFTs: Suspect due to shock  liver with hypotension.   - Trend LFTs.  4. H/o CVA: Residual left-sided weakness as well as some cognitive dysfunction per notes.  However, Matthew Mccormick lives on his own. Somewhat lethargic currently, I am not sure about his baseline.  5. Hyponatremia: Hypervolemic hyponatremia.   - Fluid restrict, diuresis.   Marca Ancona 03/15/2019 6:23 PM

## 2019-03-15 NOTE — Progress Notes (Addendum)
Progress Note  Patient Name: Matthew Mccormick Date of Encounter: 03/15/2019  Primary Cardiologist: Prentice Docker, MD   Subjective   Patient is awake and oriented x 2. No chest pain. Lactic acid improved 3.2. Last BP 132/80  Inpatient Medications    Scheduled Meds: . Chlorhexidine Gluconate Cloth  6 each Topical Daily  . clopidogrel  75 mg Oral Daily  . enoxaparin (LOVENOX) injection  55 mg Subcutaneous Q24H  . insulin aspart  0-15 Units Subcutaneous Q4H  . mouth rinse  15 mL Mouth Rinse BID   Continuous Infusions: . sodium chloride 100 mL/hr at 03/14/19 1715  . azithromycin Stopped (03/14/19 1541)  . cefTRIAXone (ROCEPHIN)  IV Stopped (03/14/19 1541)  . norepinephrine (LEVOPHED) Adult infusion 3 mcg/min (03/14/19 1637)   PRN Meds: ondansetron **OR** ondansetron (ZOFRAN) IV   Vital Signs    Vitals:   03/15/19 0500 03/15/19 0700 03/15/19 0748 03/15/19 0800  BP: 103/80 104/78  104/78  Pulse: (!) 106 (!) 103  (!) 108  Resp: (!) Temp:   98.7 F (37.1 C)   TempSrc:   Axillary   SpO2: 97% 99%  100%  Weight:      Height:        Intake/Output Summary (Last 24 hours) at 03/15/2019 0856 Last data filed at 03/15/2019 0800 Gross per 24 hour  Intake 3735.89 ml  Output 1440 ml  Net 2295.89 ml   Last 3 Weights 03/14/2019 03/13/2019 03/13/2019  Weight (lbs) 237 lb 14 oz 236 lb 12.4 oz 200 lb  Weight (kg) 107.9 kg 107.4 kg 90.719 kg      Telemetry     - Personally Reviewed  ECG    No new - Personally Reviewed  Physical Exam   GEN: No acute distress.   Neck: No JVD Cardiac: RRR, no murmurs, rubs, or gallops.  Respiratory: Clear to auscultation bilaterally. GI: Soft, nontender, non-distended  MS: 2-3+ edema; No deformity. Neuro:  Nonfocal  Psych: Normal affect   Labs    High Sensitivity Troponin:   Recent Labs  Lab 03/09/19 1536 03/09/19 1734 03/13/19 1056 03/13/19 1401 03/14/19 1506  TROPONINIHS 16 17 109* 105* 167*      Chemistry Recent Labs   Lab 03/13/19 1056 03/13/19 1056 03/14/19 0605 03/14/19 1233 03/15/19 0243  NA 128*   < > 130* 124* 130*  K 4.5   < > 4.4 5.0 4.4  CL 90*   < > 97* 94* 101  CO2 24   < > 18* 16* 16*  GLUCOSE 82   < > 111* 173* 137*  BUN 37*   < > 34* 35* 35*  CREATININE 1.17   < > 0.84 1.21 1.17  CALCIUM 9.3   < > 8.8* 8.1* 8.5*  PROT 7.4  --  6.2* 6.1*  --   ALBUMIN 4.0  --  3.3* 3.3*  --   AST 1,189*  --  1,500* 1,488*  --   ALT 939*  --  1,187* 1,311*  --   ALKPHOS 329*  --  297* 301*  --   BILITOT 2.7*  --  3.1* 3.2*  --   GFRNONAA >60   < > >60 >60 >60  GFRAA >60   < > >60 >60 >60  ANIONGAP 14   < > < > = values in this interval not displayed.     Hematology Recent Labs  Lab 03/09/19 1536 03/13/19 1056 03/14/19 4098  WBC 11.6* 16.3* 13.8*  RBC 4.63 4.77 4.91  HGB 14.4 14.8 14.9  HCT 44.2 45.7 47.2  MCV 95.5 95.8 96.1  MCH 31.1 31.0 30.3  MCHC 32.6 32.4 31.6  RDW 13.2 13.3 13.2  PLT 305 300 271    BNP Recent Labs  Lab 03/14/19 1832  BNP 1,338.6*     DDimer No results for input(s): DDIMER in the last 168 hours.   Radiology    CT Head Wo Contrast  Result Date: 03/13/2019 CLINICAL DATA:  Headache EXAM: CT HEAD WITHOUT CONTRAST TECHNIQUE: Contiguous axial images were obtained from the base of the skull through the vertex without intravenous contrast. COMPARISON:  03/09/2019 FINDINGS: Brain: No evidence of acute infarction, hemorrhage, hydrocephalus, extra-axial collection or mass lesion/mass effect. 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 Additional areas of encephalomalacia in the left frontal and parietal lobes are stable from prior. Scattered low-density changes within the periventricular and subcortical white matter compatible with chronic microvascular ischemic change. Mild diffuse cerebral volume loss. Vascular: Mild atherosclerotic calcifications involving the large vessels of the skull base. No unexpected hyperdense vessel. Skull: Normal.  Negative for fracture or focal lesion. Sinuses/Orbits: Sinonasal polyps versus mucous retention cysts in the right maxillary and left sphenoid sinuses. Paranasal sinuses are otherwise clear. Orbital structures unremarkable. Other: None. IMPRESSION: No acute intracranial findings.  Stable exam from 03/09/2019. Electronically Signed   By: Duanne Guess D.O.   On: 03/13/2019 11:42   DG Chest 1V REPEAT Same Day  Result Date: 03/14/2019 CLINICAL DATA:  Left central line placement, history of diabetes, previous tobacco abuse EXAM: CHEST - 1 VIEW SAME DAY COMPARISON:  03/14/2019 FINDINGS: Frontal view of the chest demonstrates left-sided central venous catheter tip overlying brachiocephalic confluence. Cardiac silhouette is unremarkable. The bibasilar ground-glass airspace disease and small bilateral pleural effusion seen on prior chest CT are less apparent by radiograph. No acute bony abnormalities. IMPRESSION: 1. No complication after left-sided central line placement. 2. Otherwise stable exam. Electronically Signed   By: Sharlet Salina M.D.   On: 03/14/2019 15:10   DG CHEST PORT 1 VIEW  Result Date: 03/14/2019 CLINICAL DATA:  Pneumonia, negative COVID test. EXAM: PORTABLE CHEST 1 VIEW COMPARISON:  CT chest 03/13/2019 and chest radiograph 03/13/2019. FINDINGS: Trachea is midline. Heart is enlarged, as before. Mild bibasilar airspace opacification, right greater than left, as on yesterday's CT chest. Lungs are otherwise clear. Small right pleural effusion. IMPRESSION: Mild bibasilar airspace opacification, right greater than left, and small right pleural effusion, findings which may be due to mild congestive heart failure. Pneumonia, including COVID-19 pneumonia, not excluded. Electronically Signed   By: Leanna Battles M.D.   On: 03/14/2019 09:52   DG Chest Portable 1 View  Result Date: 03/13/2019 CLINICAL DATA:  Larey Seat, right rib pain, hypertension, diabetes EXAM: PORTABLE CHEST 1 VIEW COMPARISON:  03/09/2019  FINDINGS: Single frontal view of the chest demonstrates a stable cardiac silhouette. No airspace disease, effusion, or pneumothorax. Chronic right posterior seventh rib fracture. IMPRESSION: No active disease. Electronically Signed   By: Sharlet Salina M.D.   On: 03/13/2019 10:59   ECHOCARDIOGRAM COMPLETE  Result Date: 03/14/2019   ECHOCARDIOGRAM REPORT   Patient Name:   YAVIER SNIDER Date of Exam: 03/14/2019 Medical Rec #:  [id number]      Height:       72.0 in Accession #:    [id number]     Weight:       237.9 lb Date of Birth:  [birth year]  BSA:          2.29 m Patient Age:    63 years       BP:           88/72 mmHg Patient Gender: M              HR:           110 bpm. [facility] Procedure: 2D Echo Indications:    Cardiomyopathy-Unspecified 425.9 / I42.9                 Elevated Troponin  History:        Patient has no prior history of Echocardiogram examinations.                 Stroke, Arrythmias:Atrial Fibrillation; Risk Factors:Former                 Smoker, Dyslipidemia and Diabetes. Sepsis.  Sonographer:    Jeryl Columbia RDCS (AE) Referring Phys: [id number] [place]sworth Lennox IMPRESSIONS  1. Left ventricular ejection fraction, by visual estimation, is 20 to 25%. The left ventricle has severely decreased function. There is no left ventricular hypertrophy.  2. Definity contrast agent was given IV to delineate the left ventricular endocardial borders.  3. Elevated left ventricular end-diastolic pressure.  4. Indeterminate diastolic filling due to E-A fusion.  5. The left ventricle demonstrates global hypokinesis.  6. Global right ventricle has severely reduced systolic function.The right ventricular size is mildly enlarged. No increase in right ventricular wall thickness.  7. Left atrial size was normal.  8. Right atrial size was mildly dilated.  9. Mild mitral annular calcification. 10. The mitral valve is grossly normal. Mild mitral valve regurgitation. 11. The tricuspid valve is normal in  structure. 12. The tricuspid valve is normal in structure. Tricuspid valve regurgitation is mild. 13. The aortic valve is tricuspid. Aortic valve regurgitation is not visualized. No evidence of aortic valve sclerosis or stenosis. 14. The pulmonic valve was grossly normal. Pulmonic valve regurgitation is not visualized. 15. Moderately elevated pulmonary artery systolic pressure. 16. The inferior vena cava is dilated in size with >50% respiratory variability, suggesting right atrial pressure of 8 mmHg. FINDINGS  Left Ventricle: Left ventricular ejection fraction, by visual estimation, is 20 to 25%. The left ventricle has severely decreased function. Definity contrast agent was given IV to delineate the left ventricular endocardial borders. The left ventricle demonstrates global hypokinesis. There is no left ventricular hypertrophy. Indeterminate diastolic filling due to E-A fusion. Elevated left ventricular end-diastolic pressure. Right Ventricle: The right ventricular size is mildly enlarged. No increase in right ventricular wall thickness. Global RV systolic function is has severely reduced systolic function. The tricuspid regurgitant velocity is 3.24 m/s, and with an assumed right atrial pressure of 10 mmHg, the estimated right ventricular systolic pressure is moderately elevated at 52.0 mmHg. Left Atrium: Left atrial size was normal in size. Right Atrium: Right atrial size was mildly dilated Pericardium: There is no evidence of pericardial effusion. Mitral Valve: The mitral valve is grossly normal. Mildly decreased mobility of the mitral valve leaflets. Mild mitral annular calcification. Mild mitral valve regurgitation. Tricuspid Valve: The tricuspid valve is normal in structure. Tricuspid valve regurgitation is mild. Aortic Valve: The aortic valve is tricuspid. Aortic valve regurgitation is not visualized. The aortic valve is structurally normal, with no evidence of sclerosis or stenosis. Pulmonic Valve: The  pulmonic valve was grossly normal. Pulmonic valve regurgitation is not visualized. Pulmonic regurgitation is not  visualized. Aorta: The aortic root is normal in size and structure. Venous: The inferior vena cava is dilated in size with greater than 50% respiratory variability, suggesting right atrial pressure of 8 mmHg. IAS/Shunts: No atrial level shunt detected by color flow Doppler.  LEFT VENTRICLE PLAX 2D LVIDd:         4.80 cm  Diastology LVIDs:         4.25 cm  LV e' lateral:   6.50 cm/s LV PW:         1.16 cm  LV E/e' lateral: 10.4 LV IVS:        1.06 cm  LV e' medial:    4.31 cm/s LVOT diam:     2.00 cm  LV E/e' medial:  15.8 LV SV:         27 ml LV SV Index:   11.26 LVOT Area:     3.14 cm  RIGHT VENTRICLE RV S prime:     4.62 cm/s TAPSE (M-mode): 0.8 cm LEFT ATRIUM             Index       RIGHT ATRIUM           Index LA diam:        4.90 cm 2.14 cm/m  RA Area:     19.60 cm LA Vol (A2C):   72.7 ml 31.70 ml/m RA Volume:   67.70 ml  29.52 ml/m LA Vol (A4C):   64.7 ml 28.21 ml/m LA Biplane Vol: 70.2 ml 30.61 ml/m   AORTA Ao Root diam: 2.80 cm MITRAL VALVE                        TRICUSPID VALVE MV Area (PHT): 4.21 cm             TR Peak grad:   42.0 mmHg MV PHT:        52.20 msec           TR Vmax:        324.00 cm/s MV Decel Time: 180 msec MR Peak grad: 58.1 mmHg             SHUNTS MR Mean grad: 41.0 mmHg             Systemic Diam: 2.00 cm MR Vmax:      381.00 cm/s MR Vmean:     304.0 cm/s MV E velocity: 67.90 cm/s 103 cm/s MV A velocity: 20.90 cm/s 70.3 cm/s MV E/A ratio:  3.25       1.5  Prentice Docker MD Electronically signed by Prentice Docker MD Signature Date/Time: 03/14/2019/11:15:54 AM    Final    CT Angio Chest/Abd/Pel for Dissection W and/or Wo Contrast  Result Date: 03/13/2019 CLINICAL DATA:  Abdominal pain, remote fall EXAM: CT ANGIOGRAPHY CHEST, ABDOMEN AND PELVIS TECHNIQUE: Multidetector CT imaging through the chest, abdomen and pelvis was performed using the standard protocol during  bolus administration of intravenous contrast. Multiplanar reconstructed images and MIPs were obtained and reviewed to evaluate the vascular anatomy. CONTRAST:  OMNIPAQUE IOHEXOL 350 MG/ML SOLN COMPARISON:  None. FINDINGS: CTA CHEST FINDINGS Cardiovascular: Preferential opacification of the thoracic aorta. No evidence of thoracic aortic aneurysm or dissection. Normal heart size. No pericardial effusion. Thoracic aortic atherosclerosis. Multi vessel coronary artery atherosclerosis. Mediastinum/Nodes: No enlarged mediastinal, hilar, or axillary lymph nodes. Thyroid gland, trachea, and esophagus demonstrate no significant findings. Lungs/Pleura: Small bilateral pleural effusions, right greater than left. Right lower lobe  patchy airspace disease. No pneumothorax. Musculoskeletal: No chest wall abnormality. No acute or significant osseous findings. Review of the MIP images confirms the above findings. CTA ABDOMEN AND PELVIS FINDINGS VASCULAR Aorta: Normal caliber aorta without aneurysm, dissection, vasculitis or significant stenosis. Abdominal aortic atherosclerosis. Celiac: Patent without evidence of aneurysm, dissection, vasculitis or significant stenosis. SMA: Patent without evidence of aneurysm, dissection, vasculitis or significant stenosis. Renals: Both renal arteries are patent without evidence of aneurysm, dissection, vasculitis, fibromuscular dysplasia or significant stenosis. IMA: Patent without evidence of aneurysm, dissection, vasculitis or significant stenosis. Inflow: Patent without evidence of aneurysm, dissection, vasculitis or significant stenosis. Veins: No obvious venous abnormality within the limitations of this arterial phase study. Review of the MIP images confirms the above findings. NON-VASCULAR Hepatobiliary: No focal liver abnormality is seen. No gallstones, gallbladder wall thickening, or biliary dilatation. Pancreas: Unremarkable. No pancreatic ductal dilatation or surrounding  inflammatory changes. Spleen: Normal in size without focal abnormality. Adrenals/Urinary Tract: Adrenal glands are unremarkable. Kidneys are normal, without renal calculi, focal lesion, or hydronephrosis. Bladder is unremarkable. Stomach/Bowel: Stomach is within normal limits. Appendix appears normal. No evidence of bowel wall thickening, distention, or inflammatory changes. Lymphatic: No lymphadenopathy. Reproductive: Prostate is unremarkable. Other: Small amount of abdominal and pelvic free fluid. Mild anasarca. Musculoskeletal: No acute osseous abnormality. No aggressive osseous lesion. Review of the MIP images confirms the above findings. IMPRESSION: 1. No evidence of an aortic dissection.  No aortic aneurysm. 2.  Aortic Atherosclerosis (ICD10-I70.0). 3. Right lower lobe pneumonia. 4. Small amount of pelvic free fluid. Electronically Signed   By: Elige Ko   On: 03/13/2019 11:55    Cardiac Studies   Echo 03/14/19 1. Left ventricular ejection fraction, by visual estimation, is 20 to  25%. The left ventricle has severely decreased function. There is no left  ventricular hypertrophy.  2. Definity contrast agent was given IV to delineate the left ventricular  endocardial borders.  3. Elevated left ventricular end-diastolic pressure.  4. Indeterminate diastolic filling due to E-A fusion.  5. The left ventricle demonstrates global hypokinesis.  6. Global right ventricle has severely reduced systolic function.The  right ventricular size is mildly enlarged. No increase in right  ventricular wall thickness.  7. Left atrial size was normal.  8. Right atrial size was mildly dilated.  9. Mild mitral annular calcification.  10. The mitral valve is grossly normal. Mild mitral valve regurgitation.  11. The tricuspid valve is normal in structure.  12. The tricuspid valve is normal in structure. Tricuspid valve  regurgitation is mild.  13. The aortic valve is tricuspid. Aortic valve  regurgitation is not  visualized. No evidence of aortic valve sclerosis or stenosis.  14. The pulmonic valve was grossly normal. Pulmonic valve regurgitation is  not visualized.  15. Moderately elevated pulmonary artery systolic pressure.  16. The inferior vena cava is dilated in size with >50% respiratory  variability, suggesting right atrial pressure of 8 mmHg.   Patient Profile     64 y.o. male with pmh of HTN, HLD, IDDM, and prior CVA who is being evaluated for evaluated troponin.   Assessment & Plan   1. Shock - Septic + Cardiogenic  PNA/Acute/Chronic Systolic HF  - Lactic Acid 4>3.2  - Elevated LFTs - Blood Cx with no growth - WBC elevated. 16.3 > 13.8 - on antibiotics  - No BB for now with shock.  -Volume status elevated. Continue 80 mg IV lasix twice a day.  - Check CO-OX now. Suspect will need  to add milrinone.  - Dr. Marigene Ehlers to see  2. A/C Systolic HF  BNP 9,767 - echo in 2018 showed EF 30-35%, anterolateral wall HK, G2DD, and mild to mod MR. This was performed during an admission for CVA. It does not appear he had further testing.  - Echo this admission shows severely reduced EF < 15%, elevated LVEDP, global hypokinesis, RV with severely reduced systolic function, mildly dilated RA, mild MR, mild TR, mod elevated pulmonary artery systolic pressure. Dr. Harrell Gave looked at the echo and thinks the EF is <10%. EF  2018 EF 30-35% now with evidence of Biventricular HF with EF < 15% and severe RV dysfunction.  - Volume overloaded - Follow CVP.  - Will need ischemic evaluation once stabilized.  - continue diuresis   3. Elevated troponin - Hs troponin 109 > 105. Trend is flat and not consistent with ACS. Likely demand ischemia in the setting of PNA and heart failure - EKG showed sinus tachycardia with LVH - Patient has no h/o of CAD although EF in 2018 showed EF 30-35%. New EF this admission of 20% - Patient denies chest pain or shortness of breath  4. HTN -Off HTN  meds with shock  5. 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 H/o CVA - continue Plavix  For questions or updates, please contact [place] Please consult [email or web address] for contact info under        Signed, Fredrick Dray Ninfa Meeker, PA-C  03/15/2019, 8:56 AM

## 2019-03-15 NOTE — Progress Notes (Signed)
SLP Cancellation Note  Patient Details Name: Matthew Mccormick MRN: [id number] DOB: 12/31/1955   Cancelled treatment:       Reason Eval/Treat Not Completed: Patient's level of consciousness. He could not stay awake despite max stimuli. Will attempt 2/5   Royce Macadamia 03/15/2019, 3:06 PM  Breck Coons Lonell Face.Ed Nurse, children's [phone] Office [phone]

## 2019-03-16 ENCOUNTER — Encounter (HOSPITAL_COMMUNITY): Payer: Medicare Other

## 2019-03-16 DIAGNOSIS — I878 Other specified disorders of veins: Secondary | ICD-10-CM

## 2019-03-16 LAB — COOXEMETRY PANEL
Carboxyhemoglobin: 1.4 % (ref 0.5–1.5)
Carboxyhemoglobin: 1.3 % (ref 0.5–1.5)
Methemoglobin: 0.6 % (ref 0.0–1.5)
Methemoglobin: 0.6 % (ref 0.0–1.5)
O2 Saturation: 92.2 %
O2 Saturation: 80 %
Total hemoglobin: 14.3 g/dL (ref 12.0–16.0)
Total hemoglobin: 15 g/dL (ref 12.0–16.0)

## 2019-03-16 LAB — BASIC METABOLIC PANEL
Anion gap: 12 (ref 5–15)
BUN: 22 mg/dL (ref 8–23)
CO2: 24 mmol/L (ref 22–32)
Calcium: 7.6 mg/dL — ABNORMAL LOW (ref 8.9–10.3)
Chloride: 98 mmol/L (ref 98–111)
Creatinine, Ser: 0.9 mg/dL (ref 0.61–1.24)
GFR calc Af Amer: >60 mL/min (ref >60)
GFR calc non Af Amer: >60 mL/min (ref >60)
Glucose, Bld: 234 mg/dL — ABNORMAL HIGH (ref 70–99)
Potassium: 3.7 mmol/L (ref 3.5–5.1)
Sodium: 134 mmol/L — ABNORMAL LOW (ref 135–145)

## 2019-03-16 LAB — GLUCOSE, CAPILLARY
Glucose-Capillary: 92 mg/dL (ref 70–99)
Glucose-Capillary: 91 mg/dL (ref 70–99)
Glucose-Capillary: 131 mg/dL — ABNORMAL HIGH (ref 70–99)
Glucose-Capillary: 228 mg/dL — ABNORMAL HIGH (ref 70–99)
Glucose-Capillary: 182 mg/dL — ABNORMAL HIGH (ref 70–99)

## 2019-03-16 LAB — COMPREHENSIVE METABOLIC PANEL
ALT: 1313 U/L — ABNORMAL HIGH (ref 0–44)
AST: 820 U/L — ABNORMAL HIGH (ref 15–41)
Albumin: 2.4 g/dL — ABNORMAL LOW (ref 3.5–5.0)
Alkaline Phosphatase: 216 U/L — ABNORMAL HIGH (ref 38–126)
Anion gap: 12 (ref 5–15)
BUN: 26 mg/dL — ABNORMAL HIGH (ref 8–23)
CO2: 25 mmol/L (ref 22–32)
Calcium: 8.3 mg/dL — ABNORMAL LOW (ref 8.9–10.3)
Chloride: 100 mmol/L (ref 98–111)
Creatinine, Ser: 0.79 mg/dL (ref 0.61–1.24)
GFR calc Af Amer: >60 mL/min (ref >60)
GFR calc non Af Amer: >60 mL/min (ref >60)
Glucose, Bld: 99 mg/dL (ref 70–99)
Potassium: 2.8 mmol/L — ABNORMAL LOW (ref 3.5–5.1)
Sodium: 137 mmol/L (ref 135–145)
Total Bilirubin: 2.6 mg/dL — ABNORMAL HIGH (ref 0.3–1.2)
Total Protein: 4.8 g/dL — ABNORMAL LOW (ref 6.5–8.1)

## 2019-03-16 LAB — CBC
HCT: 44.8 % (ref 39.0–52.0)
Hemoglobin: 14.7 g/dL (ref 13.0–17.0)
MCH: 30.8 pg (ref 26.0–34.0)
MCHC: 32.8 g/dL (ref 30.0–36.0)
MCV: 93.7 fL (ref 80.0–100.0)
Platelets: 160 10*3/uL (ref 150–400)
RBC: 4.78 MIL/uL (ref 4.22–5.81)
RDW: 13.5 % (ref 11.5–15.5)
WBC: 13.7 10*3/uL — ABNORMAL HIGH (ref 4.0–10.5)
nRBC: 0.1 % (ref 0.0–0.2)

## 2019-03-16 LAB — PROTIME-INR
INR: 2.6 — ABNORMAL HIGH (ref 0.8–1.2)
Prothrombin Time: 28.1 seconds — ABNORMAL HIGH (ref 11.4–15.2)

## 2019-03-16 LAB — LACTIC ACID, PLASMA
Lactic Acid, Venous: 1.4 mmol/L (ref 0.5–1.9)
Lactic Acid, Venous: 2.1 mmol/L (ref 0.5–1.9)

## 2019-03-16 MED ORDER — FUROSEMIDE 10 MG/ML IJ SOLN
80.0000 mg | Freq: Two times a day (BID) | INTRAMUSCULAR | Status: AC
  Administered 2019-03-16: 80 mg via INTRAVENOUS
  Filled 2019-03-16: qty 8

## 2019-03-16 MED ORDER — RESOURCE THICKENUP CLEAR PO POWD
ORAL | Status: DC | PRN
  Filled 2019-03-16: qty 125

## 2019-03-16 MED ORDER — DIGOXIN 125 MCG PO TABS
0.1250 mg | ORAL_TABLET | Freq: Every day | ORAL | Status: DC
  Administered 2019-03-16 – 2019-03-30 (×14): 0.125 mg via ORAL
  Filled 2019-03-16 (×14): qty 1

## 2019-03-16 MED ORDER — POTASSIUM CHLORIDE CRYS ER 20 MEQ PO TBCR
20.0000 meq | EXTENDED_RELEASE_TABLET | Freq: Two times a day (BID) | ORAL | Status: DC
  Administered 2019-03-16 – 2019-03-17 (×3): 20 meq via ORAL
  Filled 2019-03-16 (×3): qty 1

## 2019-03-16 MED ORDER — SODIUM CHLORIDE 0.9% FLUSH
3.0000 mL | Freq: Two times a day (BID) | INTRAVENOUS | Status: DC
  Administered 2019-03-19 – 2019-03-21 (×3): 3 mL via INTRAVENOUS

## 2019-03-16 MED ORDER — POTASSIUM CHLORIDE CRYS ER 20 MEQ PO TBCR: 40.0000 meq | EXTENDED_RELEASE_TABLET | Freq: Once | ORAL | Status: DC

## 2019-03-16 MED ORDER — SPIRONOLACTONE 12.5 MG HALF TABLET
12.5000 mg | ORAL_TABLET | Freq: Every day | ORAL | Status: DC
  Administered 2019-03-16 – 2019-03-18 (×3): 12.5 mg via ORAL
  Filled 2019-03-16 (×4): qty 1

## 2019-03-16 MED ORDER — POTASSIUM CHLORIDE CRYS ER 20 MEQ PO TBCR
40.0000 meq | EXTENDED_RELEASE_TABLET | Freq: Once | ORAL | Status: AC
  Administered 2019-03-16: 40 meq via ORAL
  Filled 2019-03-16: qty 2

## 2019-03-16 MED ORDER — POTASSIUM CHLORIDE 10 MEQ/100ML IV SOLN
10.0000 meq | INTRAVENOUS | Status: AC
  Administered 2019-03-16 (×4): 10 meq via INTRAVENOUS
  Filled 2019-03-16 (×4): qty 100

## 2019-03-16 NOTE — Progress Notes (Addendum)
Patient ID: Matthew Mccormick, male   DOB: [birth year], 64 y.o.   MRN: [id number]     Advanced Heart Failure Rounding Note  PCP-Cardiologist: Kate Sable, MD   Subjective:    More awake/conversational this morning but still somewhat drowsy.  Not sure of his baseline still.  Good diuresis yesterday, weight coming down, CVP 8-9 today.  Dobutamine 3 with co-ox up to 77% yesterday, inaccurate this morning.  SBP in 120s. Afebrile, no abx.    Objective:   Weight Range: 104.6 kg Body mass index is 31.28 kg/m.   Vital Signs:   Temp:  [94 F (34.4 C)-98.5 F (36.9 C)] 94 F (34.4 C) (02/05 0700) Pulse Rate:  [81-112] 92 (02/05 0800) Resp:  [10-24] 12 (02/05 0800) BP: (82-132)/(49-86) 108/78 (02/05 0800) SpO2:  [92 %-100 %] 98 % (02/05 0800) Arterial Line BP: (113-132)/(56-78) 132/70 (02/05 0800) Weight:  [104.6 kg] 104.6 kg (02/05 0429)    Weight change: Filed Weights   03/13/19 1652 03/14/19 0500 03/16/19 0429  Weight: 107.4 kg 107.9 kg 104.6 kg    Intake/Output:   Intake/Output Summary (Last 24 hours) at 03/16/2019 0839 Last data filed at 03/16/2019 0800 Gross per 24 hour  Intake 339.6 ml  Output 4885 ml  Net -4545.4 ml      Physical Exam    General:  Chronically ill-appearing.  HEENT: Normal Neck: Supple. JVP 8. Carotids 2+ bilat; no bruits. No lymphadenopathy or thyromegaly appreciated. Cor: PMI nondisplaced. Regular rate & rhythm. No rubs, gallops or murmurs. Lungs: Decreased at bases.  Abdomen: Soft, nontender, nondistended. No hepatosplenomegaly. No bruits or masses. Good bowel sounds. Extremities: No cyanosis, clubbing, rash. 1+ edema to knees.  Chronic venous stasis changes in feet.  Neuro: mildly drowsy but oriented, cranial nerves grossly intact. moves all 4 extremities w/o difficulty. Affect pleasant   Telemetry   NSR 90s (personally reviewed)  Labs    CBC Recent Labs    03/13/19 1056 03/14/19 0605  WBC 16.3* 13.8*  NEUTROABS 13.0*  --   HGB 14.8  14.9  HCT 45.7 47.2  MCV 95.8 96.1  PLT 300 073   Basic Metabolic Panel Recent Labs    03/15/19 0243 03/15/19 0415 03/16/19 0419  NA 130*  --  137  K 4.4  --  2.8*  CL 101  --  100  CO2 16*  --  25  GLUCOSE 137*  --  99  BUN 35*  --  26*  CREATININE 1.17  --  0.79  CALCIUM 8.5*  --  8.3*  MG  --  2.1  --    Liver Function Tests Recent Labs    03/14/19 1233 03/16/19 0419  AST 1,488* 820*  ALT 1,311* 1,313*  ALKPHOS 301* 216*  BILITOT 3.2* 2.6*  PROT 6.1* 4.8*  ALBUMIN 3.3* 2.4*   No results for input(s): LIPASE, AMYLASE in the last 72 hours. Cardiac Enzymes Recent Labs    03/13/19 1056  CKTOTAL 79    BNP: BNP (last 3 results) Recent Labs    03/14/19 1832  BNP 1,338.6*    ProBNP (last 3 results) No results for input(s): PROBNP in the last 8760 hours.   D-Dimer No results for input(s): DDIMER in the last 72 hours. Hemoglobin A1C Recent Labs    03/14/19 1506  HGBA1C 6.3*   Fasting Lipid Panel No results for input(s): CHOL, HDL, LDLCALC, TRIG, CHOLHDL, LDLDIRECT in the last 72 hours. Thyroid Function Tests Recent Labs    03/14/19 1506  TSH  0.599    Other results:   Imaging     No results found.   Medications:     Scheduled Medications: . Chlorhexidine Gluconate Cloth  6 each Topical Daily  . clopidogrel  75 mg Oral Daily  . enoxaparin (LOVENOX) injection  55 mg Subcutaneous Q24H  . furosemide  80 mg Intravenous BID  . insulin aspart  0-15 Units Subcutaneous Q4H  . mouth rinse  15 mL Mouth Rinse BID     Infusions: . sodium chloride    . DOBUTamine 3 mcg/kg/min (03/16/19 0800)  . norepinephrine (LEVOPHED) Adult infusion 3 mcg/min (03/14/19 1637)  . potassium chloride       PRN Medications:  sodium chloride, ondansetron **OR** ondansetron (ZOFRAN) IV    Assessment/Plan   1. Shock: Suspect primarily cardiogenic shock.  Prior echo in 2018 with EF 30-35%, now echo with EF down to 10-15% with severe RV dysfunction. Cause  of cardiomyopathy uncertain though ECG is suggestive of prior MI with anterior Qs.  No ACS this admission (HS-TnI in the 100s range with no trend). He is now off norepinephrine with improved co-ox on dobutamine 3.  CVP down to 8-9, good diuresis yesterday.  He is afebrile, WBCs mildly elevated. Creatinine lower at 0.79.  - Continue dobutamine 3 today, repeat co-ox (inaccurate this morning).  Hopefully titrate down soon.  - Lasix 80 mg IV bid for 2 doses today then stop, to po tomorrow. - Add digoxin 0.125 daily.   - Add spironolactone 12.5 daily.   - RHC/LHC most likely Monday, discussed with patient.  2. ID: Initial concern for RLL PNA on CT chest with septic shock. However, afebrile and WBCs only mildly elevated. Cultures negative so far.  - He is currently off abx (per CCM).  - Would send PCT.  3. Elevated LFTs: Suspect due to shock liver with hypotension.  LFTs now trending down.   4. H/o CVA: Residual left-sided weakness as well as some cognitive dysfunction per notes.  However, he lives on his own. Somewhat lethargic currently, I am not sure about his baseline.  - He continues on Plavix.  - Can start statin as LFTs trend down.  5. Hyponatremia: Hypervolemic hyponatremia, resolved.  6. PAD: Feet with possible ischemic changes.   - Will need peripheral arterial dopplers.   I tried to call his cousin this morning but no answer.   CRITICAL CARE Performed by: Marca Ancona  Total critical care time: 35 minutes  Critical care time was exclusive of separately billable procedures and treating other patients.  Critical care was necessary to treat or prevent imminent or life-threatening deterioration.  Critical care was time spent personally by me on the following activities: development of treatment plan with patient and/or surrogate as well as nursing, discussions with consultants, evaluation of patient's response to treatment, examination of patient, obtaining history from patient or  surrogate, ordering and performing treatments and interventions, ordering and review of laboratory studies, ordering and review of radiographic studies, pulse oximetry and re-evaluation of patient's condition.   Length of Stay: 3  Marca Ancona, MD  03/16/2019, 8:39 AM  Advanced Heart Failure Team Pager [phone] (M-F; 7a - 4p)  Please contact [facility] for night-coverage after hours (4p -7a ) and weekends on [email or web address]

## 2019-03-16 NOTE — Evaluation (Signed)
Clinical/Bedside Swallow Evaluation Patient Details  Name: Matthew Mccormick MRN: [id number] Date of Birth: [birth year]  Today's Date: 03/16/2019 Time: SLP Start Time (ACUTE ONLY): 0906 SLP Stop Time (ACUTE ONLY): 0929 SLP Time Calculation (min) (ACUTE ONLY): 23 min  Past Medical History:  Past Medical History:  Diagnosis Date  . 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 Chronic kidney disease   . Diabetes mellitus without complication ([place])   . Hyperlipidemia   . Hypertension   . Slow rate of speech   . Stroke [facility])    1 year ago-left and shaky and unsteady on feet.   Past Surgical History:  Past Surgical History:  Procedure Laterality Date  . CATARACT EXTRACTION W/PHACO Right 06/18/2013   Procedure: CATARACT EXTRACTION PHACO AND INTRAOCULAR LENS PLACEMENT (IOC);  Surgeon: Tonny Branch, MD;  Location: [facility];  Service: Ophthalmology;  Laterality: Right;  CDE 34.55  . ORIF ANKLE DISLOCATION Right    HPI:  Matthew Mccormick is 64 year old with history of HTN, hyperlipidemia, DMII, and CVA with left hemiparesis. Presented [facility] on 1/29 with weakness and fatigue. CT of head negative for acute findings but did have chronic infarcts right occipital lobe, left frontal lobe and posterior parietal region on the left. Found to have cardiogenic shock and Acute metabolic encephalopathy. CXR Mild bibasilar airspace opacification, right greater than left, and small right pleural effusion, findings which may be due to mild congestive heart failure. Pneumonia   Assessment / Plan / Recommendation Clinical Impression  Pt exhibited immediate cough following all trials water by cup and straw. Pt may have baseline dysphagia he compensates for given old stroke in setting of CT negative for acute stroke and no intubation. On 3 of 4 sips nectar thick juice s/s aspiration was absent. Functioanl mastication of solid but due to deconditioned stated and missing majority dentition, recommend Dys 3, nectar thick  liquids. pills whole in puree and full supervision. ST will see for ability to upgrade or need for instrumental testing.  SLP Visit Diagnosis: Dysphagia, pharyngeal phase (R13.13)    Aspiration Risk  Moderate aspiration risk    Diet Recommendation Dysphagia 3 (Mech soft);Nectar-thick liquid   Liquid Administration via: Cup;No straw Medication Administration: Whole meds with puree Supervision: Patient able to self feed;Staff to assist with self feeding;Full supervision/cueing for compensatory strategies Compensations: Slow rate;Small sips/bites;Minimize environmental distractions Postural Changes: Seated upright at 90 degrees    Other  Recommendations Oral Care Recommendations: Oral care BID Other Recommendations: Order thickener from pharmacy   Follow up Recommendations Other (comment)(TBA)      Frequency and Duration min 2x/week  2 weeks       Prognosis Prognosis for Safe Diet Advancement: Good Barriers to Reach Goals: Cognitive deficits      Swallow Study   General HPI: Matthew Mccormick is 64 year old with history of HTN, hyperlipidemia, DMII, and CVA with left hemiparesis. Presented to [facility] on 1/29 with weakness and fatigue. CT of head negative for acute findings but did have chronic infarcts right occipital lobe, left frontal lobe and posterior parietal region on the left. Found to have cardiogenic shock and Acute metabolic encephalopathy. CXR Mild bibasilar airspace opacification, right greater than left, and small right pleural effusion, findings which may be due to mild congestive heart failure. Pneumonia Type of Study: Bedside Swallow Evaluation Previous Swallow Assessment: (none) Diet Prior to this Study: NPO Temperature Spikes Noted: No Respiratory Status: Room air History of Recent Intubation:  No Behavior/Cognition: Alert;Cooperative;Pleasant mood Oral Cavity Assessment: Within Functional Limits Oral Care Completed by SLP: No Oral Cavity - Dentition: Poor  condition;Missing dentition Vision: Functional for self-feeding Self-Feeding Abilities: Needs assist Patient Positioning: Upright in bed Baseline Vocal Quality: Normal Volitional Cough: Strong Volitional Swallow: Able to elicit    Oral/Motor/Sensory Function Overall Oral Motor/Sensory Function: Within functional limits   Ice Chips Ice chips: Not tested   Thin Liquid Thin Liquid: Impaired Presentation: Cup;Straw Pharyngeal  Phase Impairments: Cough - Immediate    Nectar Thick Nectar Thick Liquid: Impaired Presentation: Cup Pharyngeal Phase Impairments: Cough - Immediate   Honey Thick Honey Thick Liquid: Not tested   Puree Puree: Within functional limits   Solid     Solid: Within functional limits      Royce Macadamia 03/16/2019,9:44 AM   Breck Coons Lonell Face.Ed Nurse, children's [phone] Office [phone]

## 2019-03-16 NOTE — Evaluation (Signed)
Occupational Therapy Evaluation Patient Details Name: Matthew Mccormick MRN: [id number] DOB: 07-09-1955 Today's Date: 03/16/2019    History of Present Illness Pt is 64 y/o M presents to the ER today after having had a fall at home.  He was trying to go to the bathroom and fell between his bed and dresser. Work up showed sepsis, R lower lobe PNA, peripheral cyanosis with increased INR, and elevated troponins with abnormal ECGs. PMH includes CKD, DM2, HTN, and stroke.   Clinical Impression   PTA pt living at home alone with assist of aides 6 x per week (he states during the day, unsure how many hours) for BADL and IADL. At time of eval he presents with decreased cognition, generalized weakness, and poor activity tolerance. Bed mobility completed at mod A +2 to come to EOB safely, then mod A +2 to power up to standing with RW. Pt with R posterior lean, needing increased cues and correction. Pt fatigued after this. Given current status, recommend SNF at d/c for continued BADL training prior to returning home. Will continue to follow per POC listed below.      Follow Up Recommendations  SNF;Supervision/Assistance - 24 hour    Equipment Recommendations  None recommended by OT    Recommendations for Other Services       Precautions / Restrictions Precautions Precautions: Fall Restrictions Weight Bearing Restrictions: No      Mobility Bed Mobility Overal bed mobility: Needs Assistance Bed Mobility: Supine to Sit     Supine to sit: Mod assist;+2 for physical assistance     General bed mobility comments: mod A +2 for sequencing of BLEs to EOB and truncal assist to sit on side. multimodal cues needed with assist of bed pads to scoot hips to full edge  Transfers Overall transfer level: Needs assistance Equipment used: Rolling walker (2 wheeled) Transfers: Sit to/from Stand Sit to Stand: Mod assist;+2 safety/equipment;+2 physical assistance Stand pivot transfers: Mod assist;Max assist;+2  physical assistance;+2 safety/equipment       General transfer comment: Mod assist +2 for power up, steadying, correction of R posterior leaning. Very unsteady    Balance Overall balance assessment: Needs assistance Sitting-balance support: Feet supported;Bilateral upper extremity supported Sitting balance-Leahy Scale: Poor Sitting balance - Comments: fluctuating min A to maintain sitting balance   Standing balance support: During functional activity;Bilateral upper extremity supported Standing balance-Leahy Scale: Poor Standing balance comment: reliant on external support                           ADL either performed or assessed with clinical judgement   ADL Overall ADL's : Needs assistance/impaired Eating/Feeding: Set up;Sitting   Grooming: Set up;Sitting   Upper Body Bathing: Moderate assistance;Sitting   Lower Body Bathing: Maximal assistance;Sitting/lateral leans;Sit to/from stand   Upper Body Dressing : Moderate assistance;Sitting   Lower Body Dressing: Maximal assistance;Total assistance;Sitting/lateral leans;Sit to/from stand Lower Body Dressing Details (indicate cue type and reason): to don socks; states he has assist at baseline Toilet Transfer: Moderate assistance;+2 for physical assistance;+2 for safety/equipment;Stand-pivot;Maximal assistance Toilet Transfer Details (indicate cue type and reason): simulated to recliner Toileting- Clothing Manipulation and Hygiene: Maximal assistance;Total assistance;Sit to/from stand       Functional mobility during ADLs: Moderate assistance;Maximal assistance;+2 for physical assistance;+2 for safety/equipment General ADL Comments: pt presents with cognitive limitations, decreased activity tolerance, with decreased ability to engage in BADL     Vision Patient Visual Report: No change from baseline Additional  Comments: peripheral vision impairment at baseline per chart review     Perception     Praxis       Pertinent Vitals/Pain Pain Assessment: Faces Faces Pain Scale: Hurts little more Pain Location: feet, buttocks from positioning Pain Descriptors / Indicators: Discomfort;Grimacing Pain Intervention(s): Limited activity within patient's tolerance;Monitored during session;Repositioned     Hand Dominance     Extremity/Trunk Assessment Upper Extremity Assessment Upper Extremity Assessment: Generalized weakness   Lower Extremity Assessment Lower Extremity Assessment: Generalized weakness       Communication Communication Communication: No difficulties   Cognition Arousal/Alertness: Awake/alert Behavior During Therapy: WFL for tasks assessed/performed Overall Cognitive Status: No family/caregiver present to determine baseline cognitive functioning                                 General Comments: Increased processing time, HOH   General Comments  HR 103-126 bpm with mobility, SpO2 >90% on RA when good pleth present    Exercises General Exercises - Lower Extremity Ankle Circles/Pumps: AROM;Both;10 reps;Seated   Shoulder Instructions      Home Living Family/patient expects to be discharged to:: Private residence Living Arrangements: Alone Available Help at Discharge: Family;Friend(s);Available PRN/intermittently Type of Home: Apartment Home Access: Ramped entrance     Home Layout: One level     Bathroom Shower/Tub: Other (comment)(pt was not able to recall)         Home Equipment: Cane - single point;Shower seat;Wheelchair - manual;Bedside commode;Walker - 2 wheels   Additional Comments: all information taken from previous admission where pt was able to recall and provide home set up      Prior Functioning/Environment Level of Independence: Independent with assistive device(s);Needs assistance  Gait / Transfers Assistance Needed: household and short distanced community ambulator with [facility] ADL's / Homemaking Assistance Needed: assist for BADL from  aide 6 days a week (states the come during the day only) and assist with bathing, dressing, IADLs            OT Problem List: Decreased strength;Decreased knowledge of use of DME or AE;Decreased activity tolerance;Decreased cognition;Impaired balance (sitting and/or standing)      OT Treatment/Interventions: Self-care/ADL training;Therapeutic exercise;Patient/family education;Balance training;Energy conservation;Therapeutic activities;DME and/or AE instruction    OT Goals(Current goals can be found in the care plan section) Acute Rehab OT Goals Patient Stated Goal: return home after rehab OT Goal Formulation: With patient Time For Goal Achievement: 03/30/19 Potential to Achieve Goals: Good  OT Frequency: Min 2X/week   Barriers to D/C:            Co-evaluation PT/OT/SLP Co-Evaluation/Treatment: Yes Reason for Co-Treatment: For patient/therapist safety;To address functional/ADL transfers PT goals addressed during session: Mobility/safety with mobility;Proper use of DME OT goals addressed during session: ADL's and self-care;Proper use of Adaptive equipment and DME;Strengthening/ROM      AM-PAC OT "6 Clicks" Daily Activity     Outcome Measure Help from another person eating meals?: None Help from another person taking care of personal grooming?: A Little Help from another person toileting, which includes using toliet, bedpan, or urinal?: A Lot Help from another person bathing (including washing, rinsing, drying)?: A Lot Help from another person to put on and taking off regular upper body clothing?: A Lot Help from another person to put on and taking off regular lower body clothing?: A Lot 6 Click Score: 15   End of Session Equipment Utilized During Treatment: Gait belt Nurse Communication:  Mobility status  Activity Tolerance: Patient tolerated treatment well Patient left: in chair;with call bell/phone within reach;with chair alarm set  OT Visit Diagnosis: Unsteadiness on  feet (R26.81);Other abnormalities of gait and mobility (R26.89);Muscle weakness (generalized) (M62.81);Other symptoms and signs involving cognitive function                Time: 1101-1129 OT Time Calculation (min): 28 min Charges:  OT General Charges $OT Visit: 1 Visit OT Evaluation $OT Eval Moderate Complexity: 1 Mod  Dalphine Handing, MSOT, OTR/L Acute Rehabilitation Services [facility] Office Number: [phone]  Dalphine Handing 03/16/2019, 3:14 PM

## 2019-03-16 NOTE — Progress Notes (Signed)
Notarized HCPOA for Matthew Mccormick.  Vernell Morgans Chaplain Resident Notary

## 2019-03-16 NOTE — Progress Notes (Addendum)
NAME:  Matthew Mccormick, MRN:  [id number], DOB:  03-13-1955, LOS: 3 ADMISSION DATE:  03/13/2019, CHIEF COMPLAINT:  heart failure  Brief History   64 year old gentleman with a history of atrial fibrillation, diabetes hypertension who presented to [facility] with several days of lethargy malaise nausea vomiting.  Found to be in cardiogenic shock.  Transferred to [facility] for further evaluation.  Consults:  Heart failure  Procedures:  2/3 central line placement 2/3 arterial line placement  Significant Diagnostic Tests:  Echocardiogram from 2/3 1. Left ventricular ejection fraction, by visual estimation, is 20 to  25%. The left ventricle has severely decreased function. There is no left  ventricular hypertrophy.  2. Definity contrast agent was given IV to delineate the left ventricular  endocardial borders.  3. Elevated left ventricular end-diastolic pressure.  4. Indeterminate diastolic filling due to E-A fusion.  5. The left ventricle demonstrates global hypokinesis.  6. Global right ventricle has severely reduced systolic function.The  right ventricular size is mildly enlarged. No increase in right  ventricular wall thickness.  7. Left atrial size was normal.  8. Right atrial size was mildly dilated.  9. Mild mitral annular calcification.  10. The mitral valve is grossly normal. Mild mitral valve regurgitation.  11. The tricuspid valve is normal in structure.  12. The tricuspid valve is normal in structure. Tricuspid valve  regurgitation is mild.  13. The aortic valve is tricuspid. Aortic valve regurgitation is not  visualized. No evidence of aortic valve sclerosis or stenosis.  14. The pulmonic valve was grossly normal. Pulmonic valve regurgitation is  not visualized.  15. Moderately elevated pulmonary artery systolic pressure.  16. The inferior vena cava is dilated in size with >50% respiratory  variability, suggesting right atrial pressure of 8 mmHg.    Micro Data:  Urine cultures negative, blood cultures negative, MRSA swab negative, SARS Covid 2 - and flu negative from admission on 2/2  Antimicrobials:  Was on ceftriaxone and azithromycin empirically for pneumonia  Interim history/subjective:  SCVO2 77% yesterday. BP stable. He is   Objective   Blood pressure 108/78, pulse 92, temperature (!) 94 F (34.4 C), temperature source Oral, resp. rate 12, height 6' (1.829 m), weight 104.6 kg, SpO2 98 %. CVP:  [3 mmHg-14 mmHg] 8 mmHg      Intake/Output Summary (Last 24 hours) at 03/16/2019 0850 Last data filed at 03/16/2019 0800 Gross per 24 hour  Intake 339.6 ml  Output 4885 ml  Net -4545.4 ml   Filed Weights   03/13/19 1652 03/14/19 0500 03/16/19 0429  Weight: 107.4 kg 107.9 kg 104.6 kg    Examination: General:resting but arousable.  HENT: Large neck, unable to appreciate JVD Lungs: Diminished, clear, Cardiovascular: Tachycardic, regular, frequent PVCs Abdomen: Distended, soft Extremities: 2+ pitting lower extremity edema with  Neuro: AOx3. Follows commands and is asking for breakfast.  MSK: chronic venous stasis changes Ext: dopplerable PT pulses, unable to doppler DP pulse consistently.  Lines: subclavian CVC, arterial line  Assessment & Plan:   Matthew Mccormick is a 64 year old gentleman who presents with:   Cardiogenic shock He is cold and wet on exam. Lactic acid downtrending and ScVO2 improving  Heart failure following, directing diuretic therapy His SCVO2 is 52 off inotropic support this morning - discussed with Dr. Aundra Dubin, plan is for [facility] monday  Acute metabolic encephalopathy - Secondary to hypoperfusion cardiogenic shock - improved - called his cousin this morning and did not get an answer yet - will  get collateral information about his baseline.   Acute Hypoxemic respiratory failure Secondary to acute pulmonary edema We will diurese as above Given that his cultures are negative, and there is no focal  consolidation on his chest x-ray for convincing pneumonia, I am comfortable monitoring him off antibiotics at this time. Will send PCT. He is currently stable off abx  Hypertension Holding his home antihypertensives in the setting of shock  Type 2 diabetes mellitus Holding his oral hypoglycemic agents Monitoring his blood sugars, will give sliding scale insulin He is on Lantus at home which we are currently holding, will hold back and gradually if his blood sugars climb up  History of CVA On Plavix  Transaminitis - Likely congestive hepatopathy, avoid hepatotoxic agents - downtrending.   Discontinue arterial line. Maintain subclavian central line.   Best practice:  Diet: N.p.o. for now, he is encephalopathic and speech evaluation. Pain/Anxiety/Delirium protocol (if indicated): Hold all sedating medications for encephalopathy VAP protocol (if indicated): Not indicated DVT prophylaxis: Subcu heparin GI prophylaxis: Not indicated Glucose control: Controlled, continue to monitor Foley: will need, urinary retention Mobility: With assist only Code Status: Full Disposition: Will transfer to 6E with non-titratable dobutamine. Signed out to [facility] today. They will assume care 2/6  Labs   CBC: Recent Labs  Lab 03/09/19 1536 03/13/19 1056 03/14/19 0605  WBC 11.6* 16.3* 13.8*  NEUTROABS 8.8* 13.0*  --   HGB 14.4 14.8 14.9  HCT 44.2 45.7 47.2  MCV 95.5 95.8 96.1  PLT 305 300 271    Basic Metabolic Panel: Recent Labs  Lab 03/13/19 1056 03/14/19 0605 03/14/19 1233 03/15/19 0243 03/15/19 0415 03/16/19 0419  NA 128* 130* 124* 130*  --  137  K 4.5 4.4 5.0 4.4  --  2.8*  CL 90* 97* 94* 101  --  100  CO2 24 18* 16* 16*  --  25  GLUCOSE 82 111* 173* 137*  --  99  BUN 37* 34* 35* 35*  --  26*  CREATININE 1.17 0.84 1.21 1.17  --  0.79  CALCIUM 9.3 8.8* 8.1* 8.5*  --  8.3*  MG  --   --   --   --  2.1  --    GFR: Estimated Creatinine Clearance: 118.2 mL/min (by C-G formula  based on SCr of 0.79 mg/dL). Recent Labs  Lab 03/09/19 1536 03/13/19 1056 03/13/19 1057 03/13/19 1401 03/14/19 0605 03/14/19 1546 03/15/19 0203  WBC 11.6* 16.3*  --   --  13.8*  --   --   LATICACIDVEN  --   --  3.6* 3.4*  --  4.4* 3.2*    Liver Function Tests: Recent Labs  Lab 03/09/19 1536 03/13/19 1056 03/14/19 0605 03/14/19 1233 03/16/19 0419  AST 110* 1,189* 1,500* 1,488* 820*  ALT 90* 939* 1,187* 1,311* 1,313*  ALKPHOS 128* 329* 297* 301* 216*  BILITOT 1.8* 2.7* 3.1* 3.2* 2.6*  PROT 6.8 7.4 6.2* 6.1* 4.8*  ALBUMIN 3.6 4.0 3.3* 3.3* 2.4*   Recent Labs  Lab 03/09/19 1536  LIPASE 17   No results for input(s): AMMONIA in the last 168 hours.  ABG    Component Value Date/Time   O2SAT 92.2 03/16/2019 0419     Coagulation Profile: Recent Labs  Lab 03/13/19 1056 03/14/19 0605 03/14/19 1233 03/16/19 0419  INR 1.6* 2.2* 2.6* 2.6*    Cardiac Enzymes: Recent Labs  Lab 03/13/19 1056  CKTOTAL 79    HbA1C: Hgb A1c MFr Bld  Date/Time Value Ref Range Status  03/14/2019 03:06 PM 6.3 (H) 4.8 - 5.6 % Final    Comment:    (NOTE) Pre diabetes:          5.7%-6.4% Diabetes:              >6.4% Glycemic control for   <7.0% adults with diabetes     CBG: Recent Labs  Lab 03/15/19 1530 03/15/19 1929 03/15/19 2330 03/16/19 0325 03/16/19 0739  GLUCAP 106* 95 103* 92 91    Critical care time:   The patient is critically ill with multiple organ systems failure and requires high complexity decision making for assessment and support, frequent evaluation and titration of therapies, application of advanced monitoring technologies and extensive interpretation of multiple databases.   Critical Care Time devoted to patient care services described in this note is 35 minutes. This time reflects the time of my personal involvement. This critical care time does not reflect separately billable procedures or procedure time, teaching time or supervisory time of PA/NP/Med  student/Med Resident etc but could involve care discussion time.  Mickel Baas Pulmonary and Critical Care Medicine 03/16/2019 8:50 AM  Pager: [phone] After hours pager: [phone]

## 2019-03-16 NOTE — Progress Notes (Addendum)
Physical Therapy Treatment Patient Details Name: Matthew Mccormick MRN: [id number] DOB: 1955/08/31 Today's Date: 03/16/2019    History of Present Illness Pt is 64 y/o M presents to the ER today after having had a fall at home.  He was trying to go to the bathroom and fell between his bed and dresser. Work up showed sepsis, R lower lobe PNA, peripheral cyanosis with increased INR, and elevated troponins with abnormal ECGs. PMH includes CKD, DM2, HTN, and stroke.    PT Comments    Pt agreeable to OOB mobility this session with PT and OT. Pt required min-mod +2 for bed mobility, transfer, and very short distance ambulation with RW. Pt with heavy posterior and R lateral leaning requiring PT intervention to correct during standing. Tachycardia noted with stand and ambulation, maintained at ~125 bpm until rest. PT continuing to recommend SNF level of care post-acutely, will continue to follow acutely.    Follow Up Recommendations  SNF     Equipment Recommendations  None recommended by PT    Recommendations for Other Services       Precautions / Restrictions Precautions Precautions: Fall Restrictions Weight Bearing Restrictions: No    Mobility  Bed Mobility Overal bed mobility: Needs AssistancBed Mobility: Supine to Sit     Supine to sit: Min assist;+2 for safety/equipment     General bed mobility comments: mod A +2 for sequencing of BLEs to EOB and truncal assist to sit on side. multimodal cues needed with assist of bed pads to scoot hips to full edge  Transfers Overall transfer level: Needs assistance Equipment used: Rolling walker (2 wheeled)(Simultaneous filing. User may not have seen previous data.) Transfers: Sit to/from Stand Sit to Stand: Mod assist;+2 safety/equipment;+2 physical assistanceStand pivot transfers: Mod assist;Max assist;+2 physical assistance;+2 safety/equipment       General transfer comment: Mod assist +2 for power up, steadying, correction of R posterior  leaning. Very unsteady  Ambulation/Gait Ambulation/Gait assistance: Mod assist;+2 safety/equipment;+2 physical assistance Gait Distance (Feet): 5 Feet Assistive device: Rolling walker (2 wheeled) Gait Pattern/deviations: Decreased step length - right;Decreased step length - left;Decreased stride length;Wide base of support Gait velocity: decreased   General Gait Details: mod assist for steadying, corecting R lateral and posterior leaning, guiding pt and RW. Verbal cuing for placement in RW. Pt with wide BOS and appears to have decreased proprioception of feet.   Stairs             Wheelchair Mobility    Modified Rankin (Stroke Patients Only)       Balance Overall balance assessment: Needs assistance Sitting-balance support: Feet supported;Bilateral upper extremity supported Sitting balance-Leahy Scale: Poor Sitting balance - Comments: fluctuating min A to maintain sitting balance   Standing balance support: During functional activity;Bilateral upper extremity supported Standing balance-Leahy Scale: Poor Standing balance comment: reliant on external support                            Cognition Arousal/Alertness: Awake/alert Behavior During Therapy: WFL for tasks assessed/performed Overall Cognitive Status: No family/caregiver present to determine baseline cognitive functioning                                 General Comments: Increased processing time, [facility]      Exercises General Exercises - Lower Extremity Ankle Circles/Pumps: AROM;Both;10 reps;Seated    General Comments General comments (skin integrity, edema, etc.): HR  103-126 bpm with mobility, SpO2 >90% on RA when good pleth present      Pertinent Vitals/Pain Pain Assessment: Faces Faces Pain Scale: Hurts little more Pain Location: feet, buttocks from positioning Pain Descriptors / Indicators: Discomfort;Grimacing Pain Intervention(s): Limited activity within patient's  tolerance;Monitored during session;Repositioned    Home Living Family/patient expects to be discharged to:: Private residence Living Arrangements: Alone Available Help at Discharge: Family;Friend(s);Available PRN/intermittently Type of Home: Apartment Home Access: Ramped entrance   Home Layout: One level Home Equipment: Cane - single point;Shower seat;Wheelchair - manual;Bedside commode;Walker - 2 wheels Additional Comments: all information taken from previous admission where pt was able to recall and provide home set up    Prior Function Level of Independence: Independent with assistive device(s);Needs assistance  Gait / Transfers Assistance Needed: household and short distanced community ambulator with [facility] ADL's / Homemaking Assistance Needed: assist for BADL from aide 6 days a week (states the come during the day only) and assist with bathing, dressing, IADLs     PT Goals (current goals can now be found in the care plan section) Acute Rehab PT Goals Patient Stated Goal: return home after rehab PT Goal Formulation: With patient Time For Goal Achievement: 03/28/19 Potential to Achieve Goals: Good Progress towards PT goals: Progressing toward goals    Frequency    Min 2X/week      PT Plan Current plan remains appropriate    Co-evaluation PT/OT/SLP Co-Evaluation/Treatment: Yes Reason for Co-Treatment: For patient/therapist safety;To address functional/ADL transfers PT goals addressed during session: Mobility/safety with mobility;Proper use of DME OT goals addressed during session: ADL's and self-care;Proper use of Adaptive equipment and DME;Strengthening/ROM      AM-PAC PT "6 Clicks" Mobility   Outcome Measure  Help needed turning from your back to your side while in a flat bed without using bedrails?: A Little Help needed moving from lying on your back to sitting on the side of a flat bed without using bedrails?: A Little Help needed moving to and from a bed to a chair  (including a wheelchair)?: A Lot Help needed standing up from a chair using your arms (e.g., wheelchair or bedside chair)?: A Lot Help needed to walk in hospital room?: A Lot Help needed climbing 3-5 steps with a railing? : Total 6 Click Score: 13    End of Session Equipment Utilized During Treatment: Gait belt Activity Tolerance: Patient limited by fatigue Patient left: in chair;with call bell/phone within reach;with chair alarm set;Other (comment)(with bear hugger on) Nurse Communication: Mobility status PT Visit Diagnosis: Unsteadiness on feet (R26.81);Other abnormalities of gait and mobility (R26.89);Muscle weakness (generalized) (M62.81)     Time: 1110-1130 PT Time Calculation (min) (ACUTE ONLY): 20 min  Charges:  $Therapeutic Activity: 8-22 mins                     Odai Wimmer E, PT Acute Rehabilitation Services Pager [phone]  Office [phone]    Sherley Mckenney D Elonda Husky 03/16/2019, 2:53 PM

## 2019-03-16 NOTE — Progress Notes (Signed)
Chaplain engaged in initial visit with Matthew Mccormick.  Chaplain completed Advanced Directive with Matthew Mccormick and his cousin [name].  Chaplain will follow-up as needed.

## 2019-03-17 ENCOUNTER — Inpatient Hospital Stay (HOSPITAL_COMMUNITY): Payer: Medicare Other

## 2019-03-17 DIAGNOSIS — I998 Other disorder of circulatory system: Secondary | ICD-10-CM

## 2019-03-17 LAB — COMPREHENSIVE METABOLIC PANEL WITH GFR
ALT: 1080 U/L — ABNORMAL HIGH (ref 0–44)
AST: 533 U/L — ABNORMAL HIGH (ref 15–41)
Albumin: 2.3 g/dL — ABNORMAL LOW (ref 3.5–5.0)
Alkaline Phosphatase: 248 U/L — ABNORMAL HIGH (ref 38–126)
Anion gap: 8 (ref 5–15)
BUN: 19 mg/dL (ref 8–23)
CO2: 28 mmol/L (ref 22–32)
Calcium: 7.9 mg/dL — ABNORMAL LOW (ref 8.9–10.3)
Chloride: 97 mmol/L — ABNORMAL LOW (ref 98–111)
Creatinine, Ser: 0.68 mg/dL (ref 0.61–1.24)
GFR calc Af Amer: >60 mL/min
GFR calc non Af Amer: >60 mL/min
Glucose, Bld: 150 mg/dL — ABNORMAL HIGH (ref 70–99)
Potassium: 3.6 mmol/L (ref 3.5–5.1)
Sodium: 133 mmol/L — ABNORMAL LOW (ref 135–145)
Total Bilirubin: 2.3 mg/dL — ABNORMAL HIGH (ref 0.3–1.2)
Total Protein: 5 g/dL — ABNORMAL LOW (ref 6.5–8.1)

## 2019-03-17 LAB — COOXEMETRY PANEL
Carboxyhemoglobin: 1.3 % (ref 0.5–1.5)
Carboxyhemoglobin: 1.9 % — ABNORMAL HIGH (ref 0.5–1.5)
Methemoglobin: 0.6 % (ref 0.0–1.5)
Methemoglobin: 1 % (ref 0.0–1.5)
O2 Saturation: 67.6 %
O2 Saturation: 72.8 %
Total hemoglobin: 13.6 g/dL (ref 12.0–16.0)
Total hemoglobin: 14 g/dL (ref 12.0–16.0)

## 2019-03-17 LAB — CBC
HCT: 40.8 % (ref 39.0–52.0)
Hemoglobin: 13.7 g/dL (ref 13.0–17.0)
MCH: 30.8 pg (ref 26.0–34.0)
MCHC: 33.6 g/dL (ref 30.0–36.0)
MCV: 91.7 fL (ref 80.0–100.0)
Platelets: 182 10*3/uL (ref 150–400)
RBC: 4.45 MIL/uL (ref 4.22–5.81)
RDW: 13.6 % (ref 11.5–15.5)
WBC: 12.3 10*3/uL — ABNORMAL HIGH (ref 4.0–10.5)
nRBC: 0 % (ref 0.0–0.2)

## 2019-03-17 LAB — GLUCOSE, CAPILLARY
Glucose-Capillary: 150 mg/dL — ABNORMAL HIGH (ref 70–99)
Glucose-Capillary: 129 mg/dL — ABNORMAL HIGH (ref 70–99)
Glucose-Capillary: 190 mg/dL — ABNORMAL HIGH (ref 70–99)
Glucose-Capillary: 241 mg/dL — ABNORMAL HIGH (ref 70–99)
Glucose-Capillary: 218 mg/dL — ABNORMAL HIGH (ref 70–99)

## 2019-03-17 LAB — PROTIME-INR
INR: 1.9 — ABNORMAL HIGH (ref 0.8–1.2)
Prothrombin Time: 21.7 seconds — ABNORMAL HIGH (ref 11.4–15.2)

## 2019-03-17 MED ORDER — POTASSIUM CHLORIDE CRYS ER 20 MEQ PO TBCR
40.0000 meq | EXTENDED_RELEASE_TABLET | Freq: Once | ORAL | Status: AC
  Administered 2019-03-17: 40 meq via ORAL
  Filled 2019-03-17: qty 2

## 2019-03-17 MED ORDER — METOLAZONE 5 MG PO TABS
5.0000 mg | ORAL_TABLET | Freq: Once | ORAL | Status: AC
  Administered 2019-03-17: 5 mg via ORAL
  Filled 2019-03-17: qty 1

## 2019-03-17 NOTE — Progress Notes (Signed)
Pt HR slowly trending upwards from the low 90's to the 130's and sustaining. Pt asymptomatic. New order received for Coox. Specimen sent and resulted. Dr. Scarlette Calico came to evaluate pt. HS RN updated.

## 2019-03-17 NOTE — Progress Notes (Addendum)
Patient ID: Matthew Mccormick, male   DOB: January 31, 1956, 64 y.o.   MRN: [id number]     Advanced Heart Failure Rounding Note  PCP-Cardiologist: Prentice Docker, MD   Subjective:    Dobutamine increased from 3->5 yesterday. HR faster. Co-ox 68%  LFTs coming down. I/Os -1.1L. Weights seem inaccurate.  Denies CP or SOB. Feels weak. Unable to walk.   CVP 10-11   Objective:   Weight Range: 107.3 kg Body mass index is 32.08 kg/m.   Vital Signs:   Temp:  [97.8 F (36.6 C)-98.7 F (37.1 C)] 98.7 F (37.1 C) (02/06 1110) Pulse Rate:  [106-127] 112 (02/06 1110) Resp:  [15-22] 15 (02/06 0333) BP: (81-116)/(48-82) 93/63 (02/06 1110) SpO2:  [78 %-98 %] 98 % (02/06 1110) Weight:  [107.3 kg] 107.3 kg (02/06 0333)    Weight change: Filed Weights   03/14/19 0500 03/16/19 0429 03/17/19 0333  Weight: 107.9 kg 104.6 kg 107.3 kg    Intake/Output:   Intake/Output Summary (Last 24 hours) at 03/17/2019 1257 Last data filed at 03/17/2019 1113 Gross per 24 hour  Intake 1308.99 ml  Output 3025 ml  Net -1716.01 ml      Physical Exam    General:  Sitting up in bed. Weak appearing. No respiratory difficulty HEENT: normal Neck: supple. JVP too jaw Carotids 2+ bilat; no bruits. No lymphadenopathy or thryomegaly appreciated. Cor: PMI nondisplaced. Tachy regular +s3.  Lungs: clear Abdomen: obese soft, nontender, nondistended. No hepatosplenomegaly. No bruits or masses. Good bowel sounds. Extremities: no cyanosis, clubbing, rash, 2+ edema Neuro: alert & oriented, mild L weakness and some aphasia. Pleasant affect   Telemetry   NSR 100-120s (personally reviewed)  Labs    CBC Recent Labs    03/16/19 0854 03/17/19 0500  WBC 13.7* 12.3*  HGB 14.7 13.7  HCT 44.8 40.8  MCV 93.7 91.7  PLT 160 182   Basic Metabolic Panel Recent Labs    01/65/53 0415 03/16/19 0419 03/16/19 1500 03/17/19 0500  NA  --    < > 134* 133*  K  --    < > 3.7 3.6  CL  --    < > 98 97*  CO2  --    < > 24 28   GLUCOSE  --    < > 234* 150*  BUN  --    < > 22 19  CREATININE  --    < > 0.90 0.68  CALCIUM  --    < > 7.6* 7.9*  MG 2.1  --   --   --    < > = values in this interval not displayed.   Liver Function Tests Recent Labs    03/16/19 0419 03/17/19 0500  AST 820* 533*  ALT 1,313* 1,080*  ALKPHOS 216* 248*  BILITOT 2.6* 2.3*  PROT 4.8* 5.0*  ALBUMIN 2.4* 2.3*   No results for input(s): LIPASE, AMYLASE in the last 72 hours. Cardiac Enzymes No results for input(s): CKTOTAL, CKMB, CKMBINDEX, TROPONINI in the last 72 hours.  BNP: BNP (last 3 results) Recent Labs    03/14/19 1832  BNP 1,338.6*    ProBNP (last 3 results) No results for input(s): PROBNP in the last 8760 hours.   D-Dimer No results for input(s): DDIMER in the last 72 hours. Hemoglobin A1C Recent Labs    03/14/19 1506  HGBA1C 6.3*   Fasting Lipid Panel No results for input(s): CHOL, HDL, LDLCALC, TRIG, CHOLHDL, LDLDIRECT in the last 72 hours. Thyroid Function Tests Recent  Labs    03/14/19 1506  TSH 0.599    Other results:   Imaging    VAS [place] ABI WITH/WO TBI  Result Date: 03/17/2019 LOWER EXTREMITY DOPPLER STUDY High Risk         Hypertension, hyperlipidemia, Diabetes, past history of Factors:          smoking.  Comparison Study: 06/24/2016 Performing Technologist: Levin Bacon RDMS, RVT  Examination Guidelines: A complete evaluation includes at minimum, Doppler waveform signals and systolic blood pressure reading at the level of bilateral brachial, anterior tibial, and posterior tibial arteries, when vessel segments are accessible. Bilateral testing is considered an integral part of a complete examination. Photoelectric Plethysmograph (PPG) waveforms and toe systolic pressure readings are included as required and additional duplex testing as needed. Limited examinations for reoccurring indications may be performed as noted.  ABI Findings: +---------+------------------+-----+---------+--------+ Right     Rt Pressure (mmHg)IndexWaveform Comment  +---------+------------------+-----+---------+--------+ Brachial 101                    triphasic         +---------+------------------+-----+---------+--------+ PTA      51                0.47 audible           +---------+------------------+-----+---------+--------+ DP       79                0.73 audible           +---------+------------------+-----+---------+--------+ Great Toe26                0.24                   +---------+------------------+-----+---------+--------+ +---------+------------------+-----+---------+-------+ Left     Lt Pressure (mmHg)IndexWaveform Comment +---------+------------------+-----+---------+-------+ Brachial 108                    triphasic        +---------+------------------+-----+---------+-------+ PTA      85                0.79 biphasic         +---------+------------------+-----+---------+-------+ DP       108               1.00 triphasic        +---------+------------------+-----+---------+-------+ Great Toe58                0.54 Normal           +---------+------------------+-----+---------+-------+ +-------+-----------+-----------+------------+------------+ ABI/TBIToday's ABIToday's TBIPrevious ABIPrevious TBI +-------+-----------+-----------+------------+------------+ Right  0.73                                           +-------+-----------+-----------+------------+------------+ Left   1.00                                           +-------+-----------+-----------+------------+------------+  Summary: Right: Resting right ankle-brachial index indicates moderate right lower extremity arterial disease. RT great toe pressure = 26 mmHg. Left: Resting left ankle-brachial index is within normal range. No evidence of significant left lower extremity arterial disease. LT Great toe pressure = 58 mmHg.  *See table(s) above for measurements and observations.     Preliminary      Medications:  Scheduled Medications: . Chlorhexidine Gluconate Cloth  6 each Topical Daily  . clopidogrel  75 mg Oral Daily  . digoxin  0.125 mg Oral Daily  . enoxaparin (LOVENOX) injection  55 mg Subcutaneous Q24H  . insulin aspart  0-15 Units Subcutaneous Q4H  . mouth rinse  15 mL Mouth Rinse BID  . potassium chloride  20 mEq Oral BID  . [START ON 03/19/2019] sodium chloride flush  3 mL Intravenous Q12H  . spironolactone  12.5 mg Oral Daily    Infusions: . sodium chloride    . DOBUTamine 5 mcg/kg/min (03/17/19 0733)    PRN Medications: sodium chloride, ondansetron **OR** ondansetron (ZOFRAN) IV, Resource ThickenUp Clear    Assessment/Plan   1. Shock: Suspect primarily cardiogenic shock.  Prior echo in 2018 with EF 30-35%, now echo with EF down to 10-15% with severe RV dysfunction. Cause of cardiomyopathy uncertain though ECG is suggestive of prior MI with anterior Qs.  No ACS this admission (HS-TnI in the 100s range with no trend). He is now off norepinephrine with improved co-ox on dobutamine 5 (increased from 3 on 2/5). Remains markedly volume overloaded with CVP 10 and prominent s3 - Continue dobutamine at 5. Watch tachycardia - Continue IV lasix. Add metolazone - Continue digoxin 0.125 daily.   - Continue spironolactone 12.5 daily.   - RHC/LHC when further diuresed. Potentially Monday.  2. ID: Initial concern for RLL PNA on CT chest with septic shock. However, afebrile and WBCs only mildly elevated. Cultures remains negative. Afebrile  - He is currently off abx (per CCM).  3. Elevated LFTs: Suspect due to shock liver with hypotension.  LFTs now trending down.   4. H/o CVA: Residual left-sided weakness as well as some cognitive dysfunction per notes.  However, he lives on his own.  - He continues on Plavix.  - Can start statin as LFTs trend down.  5. Hyponatremia: Hypervolemic hyponatremia, resolved.  6. PAD: Feet with possible ischemic changes.   -  Will need peripheral arterial dopplers.    Length of Stay: [place], MD  03/17/2019, 12:57 PM  Advanced Heart Failure Team Pager [phone] (M-F; 7a - 4p)  Please contact [facility] for night-coverage after hours (4p -7a ) and weekends on [email or web address]

## 2019-03-17 NOTE — Progress Notes (Signed)
I was informed that Mr. Kitchings heart rate has steadily increased from 100s to 120s-130s this evening. On my interview, Mr. Mahnke is alert and interactive, with warm extremities and ongoing dyspnea but no chest pain, nausea or vomiting, or other new symptoms. He is overall feeling better than yesterday. Co-ox was 72.8. Based on the above, I think tachycardia is more likely to be an effect of dobutamine rather than a sign of evolving shock. Dose was increased from 3 to 5 yesterday. I will decrease dose to 4 mcg / kg /min and check another Co-ox in 6 hours to evaluate whether this dose may provide the best balance of inotropic support while avoiding excess tachycardia.   Cynda [place], MD

## 2019-03-17 NOTE — Progress Notes (Signed)
Patient is A&O X4. No significant event has taken place. Will continue to monitor.

## 2019-03-17 NOTE — Progress Notes (Signed)
[facility]                                                                              Patient Demographics  Matthew Mccormick, is a 64 y.o. male, DOB - [birth year], ZHY:[id number]  Admit date - 03/13/2019   Admitting Physician Wilson Singer, MD  Outpatient Primary MD for the patient is Kirstie Peri, MD  Outpatient specialists:   LOS - 4  days    Chief Complaint  Patient presents with  . Fall       Brief summary  64 year old gentleman with a history of atrial fibrillation, diabetes hypertension who presented to [facility] with several days of lethargy malaise, nausea and vomiting, and admitted for cardiogenic shock for which reason he was transferred to [facility] for further treatment  Assessment & Plan    Active Problems:   Sepsis (HCC)   Pressure injury of skin   Ischemic hepatitis  Cardiogenic shock On dobutamine Advanced heart failure team directing care at this time  Acute metabolic encephalopathy Felt to be due to cardiogenic shock Supportive care  Acute hypoxemic respiratory failure Secondary to acute pulmonary edema Chest x-ray negative for pneumonia-being monitored off antibiotics per pulmonary medicine Continue diuresis   Hypertension Antihypertensive regimen on hold for now obviously due to cardiogenic shock  Type 2 diabetes mellitus Oral hypoglycemics on hold Continue sliding scale with monitoring of glucose-may introduce home Lantus as needed for glycemic control   History of CVA Continue Plavix  Transaminasemia Likely due to hepatic congestion Follow clinically and avoid hepatotoxics      Disposition Plan: TBD  Time Spent in minutes 35 minutes  Procedures:    Consultants:   Pulmonary critical care Advanced heart failure cardiology  Antimicrobials:      Medications  Scheduled Meds: . Chlorhexidine Gluconate Cloth  6 each Topical Daily  . clopidogrel  75 mg Oral Daily  . digoxin  0.125  mg Oral Daily  . enoxaparin (LOVENOX) injection  55 mg Subcutaneous Q24H  . insulin aspart  0-15 Units Subcutaneous Q4H  . mouth rinse  15 mL Mouth Rinse BID  . potassium chloride  20 mEq Oral BID  . [START ON 03/19/2019] sodium chloride flush  3 mL Intravenous Q12H  . spironolactone  12.5 mg Oral Daily   Continuous Infusions: . sodium chloride    . DOBUTamine 5 mcg/kg/min (03/17/19 0733)   PRN Meds:.sodium chloride, ondansetron **OR** ondansetron (ZOFRAN) IV, Resource ThickenUp Clear   Antibiotics   Anti-infectives (From admission, onward)   Start     Dose/Rate Route Frequency Ordered Stop   03/14/19 1200  cefTRIAXone (ROCEPHIN) 2 g in sodium chloride 0.9 % 100 mL IVPB  Status:  Discontinued     2 g 200 mL/hr over 30 Minutes Intravenous Every 24 hours 03/14/19 0951 03/15/19 0954   03/14/19 1100  azithromycin (ZITHROMAX) 500 mg in sodium chloride 0.9 % 250 mL IVPB  Status:  Discontinued     500 mg 250 mL/hr over 60 Minutes Intravenous Every 24 hours 03/14/19 0951 03/15/19 0954   03/14/19 0000  vancomycin (VANCOREADY) IVPB 1250 mg/250 mL  Status:  Discontinued     1,250 mg 166.7 mL/hr over 90 Minutes Intravenous Every 12 hours 03/13/19 1158 03/14/19 0951   03/13/19 2000  ceFEPIme (MAXIPIME) 2 g in sodium chloride 0.9 % 100 mL IVPB  Status:  Discontinued     2 g 200 mL/hr over 30 Minutes Intravenous Every 8 hours 03/13/19 1058 03/14/19 0951   03/13/19 1030  vancomycin (VANCOCIN) IVPB 1000 mg/200 mL premix  Status:  Discontinued     1,000 mg 200 mL/hr over 60 Minutes Intravenous Every 1 hr x 2 03/13/19 1001 03/13/19 1002   03/13/19 1030  vancomycin (VANCOREADY) IVPB 2000 mg/400 mL     2,000 mg 200 mL/hr over 120 Minutes Intravenous  Once 03/13/19 1002 03/13/19 1343   03/13/19 1000  ceFEPIme (MAXIPIME) 2 g in sodium chloride 0.9 % 100 mL IVPB     2 g 200 mL/hr over 30 Minutes Intravenous  Once 03/13/19 0952 03/13/19 1103   03/13/19 1000  metroNIDAZOLE (FLAGYL) IVPB 500 mg     500  mg 100 mL/hr over 60 Minutes Intravenous  Once 03/13/19 0952 03/13/19 1141   03/13/19 1000  vancomycin (VANCOCIN) IVPB 1000 mg/200 mL premix  Status:  Discontinued     1,000 mg 200 mL/hr over 60 Minutes Intravenous  Once 03/13/19 2956 03/13/19 1001        Subjective:   [name] was seen and examined today.  Patient denies dizziness, chest pain. No acute events overnight.    Objective:   Vitals:   03/17/19 0333 03/17/19 0757 03/17/19 1110 03/17/19 1629  BP: 108/82 104/78 93/63 99/79   Pulse: (!) 106  (!) 112 (!) 119  Resp: 15     Temp: 97.8 F (36.6 C) 98.2 F (36.8 C) 98.7 F (37.1 C) 99.2 F (37.3 C)  TempSrc: Oral Oral Oral Oral  SpO2: 97%  98% 97%  Weight: 107.3 kg     Height:        Intake/Output Summary (Last 24 hours) at 03/17/2019 1635 Last data filed at 03/17/2019 1524 Gross per 24 hour  Intake 1120 ml  Output 3075 ml  Net -1955 ml     Wt Readings from Last 3 Encounters:  03/17/19 107.3 kg  03/09/19 95.3 kg  11/07/17 98.4 kg     Exam  General: NAD  HEENT: NCAT,  PERRL,MMM  Neck: SUPPLE, (+) JVD  Cardiovascular: mildly tachcardic, (+) S3 GALLOP, (-) MURMUR  Respiratory: CTA  Gastrointestinal: SOFT, (-) DISTENSION, BS(+), (_) TENDERNESS  Ext: (-) CYANOSIS, (2+) EDEMA  Neuro: Alert and responsive, no acute focal deficit     Data Reviewed:  I have personally reviewed following labs and imaging studies  Micro Results Recent Results (from the past 240 hour(s))  Blood Culture (routine x 2)     Status: None (Preliminary result)   Collection Time: 03/13/19 10:15 AM   Specimen: BLOOD RIGHT FOREARM  Result Value Ref Range Status   Specimen Description BLOOD RIGHT FOREARM DRAWN BY RN BP  Final   Special Requests   Final    Blood Culture results may not be optimal due to an inadequate volume of blood received in culture bottles   Culture   Final    NO GROWTH 4 DAYS Performed at [facility], [address] [place], [place] [address]     Report Status PENDING  Incomplete  Blood Culture (routine x 2)     Status: None (Preliminary result)   Collection Time: 03/13/19 10:20 AM   Specimen: BLOOD LEFT FOREARM  Result Value Ref Range Status   Specimen Description BLOOD LEFT FOREARM DRAWN BY RN BP  Final   Special Requests   Final    BOTTLES DRAWN AEROBIC AND ANAEROBIC Blood Culture results may not be optimal due to an inadequate volume of blood received in culture bottles   Culture   Final    NO GROWTH 4 DAYS Performed at [facility], [address] [place], [place] [address]    Report Status PENDING  Incomplete  Respiratory Panel by RT PCR (Flu A&B, Covid) - Nasopharyngeal Swab     Status: None   Collection Time: 03/13/19 10:58 AM   Specimen: Nasopharyngeal Swab  Result Value Ref Range Status   SARS Coronavirus 2 by RT PCR NEGATIVE NEGATIVE Final    Comment: (NOTE) SARS-CoV-2 target nucleic acids are NOT DETECTED. The SARS-CoV-2 RNA is generally detectable in upper respiratoy specimens during the acute phase of infection. The lowest concentration of SARS-CoV-2 viral copies this assay can detect is 131 copies/mL. A negative result does not preclude SARS-Cov-2 infection and should not be used as the sole basis for treatment or other patient management decisions. A negative result may occur with  improper specimen collection/handling, submission of specimen other than nasopharyngeal swab, presence of viral mutation(s) within the areas targeted by this assay, and inadequate number of viral copies (<131 copies/mL). A negative result must be combined with clinical observations, patient history, and epidemiological information. The expected result is Negative. Fact Sheet for Patients:  https://www.moore.com/ Fact Sheet for Healthcare Providers:  https://www.young.biz/ This test is not yet ap proved or cleared by the [place] FDA and  has been authorized for detection and/or  diagnosis of SARS-CoV-2 by FDA under an Emergency Use Authorization (EUA). This EUA will remain  in effect (meaning this test can be used) for the duration of the COVID-19 declaration under Section 564(b)(1) of the Act, 21 U.S.C. section 360bbb-3(b)(1), unless the authorization is terminated or revoked sooner.    Influenza A by PCR NEGATIVE NEGATIVE Final   Influenza B by PCR NEGATIVE NEGATIVE Final    Comment: (NOTE) The Xpert Xpress SARS-CoV-2/FLU/RSV assay is intended as an aid in  the diagnosis of influenza from Nasopharyngeal swab specimens and  should not be used as a sole basis for treatment. Nasal washings and  aspirates are unacceptable for Xpert Xpress SARS-CoV-2/FLU/RSV  testing. Fact Sheet for Patients: https://www.moore.com/ Fact Sheet for Healthcare Providers: https://www.young.biz/ This test is not yet approved or cleared by the [place] FDA and  has been authorized for detection and/or diagnosis of SARS-CoV-2 by  FDA under an Emergency Use Authorization (EUA). This EUA will remain  in effect (meaning this test can be used) for the duration of the  Covid-19 declaration under Section 564(b)(1) of the Act, 21  U.S.C. section 360bbb-3(b)(1), unless the authorization is  terminated or revoked. Performed at [facility], [address] [place], [place] [address]   MRSA PCR Screening     Status: None   Collection Time: 03/13/19  4:32 PM   Specimen: Nasal Mucosa; Nasopharyngeal  Result Value Ref Range Status   MRSA by PCR NEGATIVE NEGATIVE Final    Comment:        The GeneXpert MRSA Assay (FDA approved for NASAL specimens only), is one component of a comprehensive MRSA colonization surveillance program. It is not intended to diagnose MRSA infection nor to guide or monitor treatment for MRSA infections. Performed at [facility], [address], [place], [place] [address]  Urine culture     Status: None   Collection Time:  03/14/19  6:00 AM   Specimen: In/Out Cath Urine  Result Value Ref Range Status   Specimen Description   Final    IN/OUT CATH URINE Performed at [facility], [address] [place], [place] [address]    Special Requests   Final    NONE Performed at [facility], [address] [place], [place] [address]    Culture   Final    NO GROWTH Performed at [facility], [address] [place], [place] [address]    Report Status 03/15/2019 FINAL  Final    Radiology Reports CT Head Wo Contrast  Result Date: 03/13/2019 CLINICAL DATA:  Headache EXAM: CT HEAD WITHOUT CONTRAST TECHNIQUE: Contiguous axial images were obtained from the base of the skull through the vertex without intravenous contrast. COMPARISON:  03/09/2019 FINDINGS: Brain: No evidence of acute infarction, hemorrhage, hydrocephalus, extra-axial collection or mass lesion/mass effect. 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 Additional areas of encephalomalacia in the left frontal and parietal lobes are stable from prior. Scattered low-density changes within the periventricular and subcortical white matter compatible with chronic microvascular ischemic change. Mild diffuse cerebral volume loss. Vascular: Mild atherosclerotic calcifications involving the large vessels of the skull base. No unexpected hyperdense vessel. Skull: Normal. Negative for fracture or focal lesion. Sinuses/Orbits: Sinonasal polyps versus mucous retention cysts in the right maxillary and left sphenoid sinuses. Paranasal sinuses are otherwise clear. Orbital structures unremarkable. Other: None. IMPRESSION: No acute intracranial findings.  Stable exam from 03/09/2019. Electronically Signed   By: Duanne Guess D.O.   On: 03/13/2019 11:42   CT Head Wo Contrast  Result Date: 03/09/2019 CLINICAL DATA:  Weakness. EXAM: CT HEAD WITHOUT CONTRAST TECHNIQUE: Contiguous axial images were obtained from the base of the skull through the vertex without  intravenous contrast. COMPARISON:  Brain MRI, dated November 19, 2017, is available for comparison. FINDINGS: Brain: There is mild to moderate severity cerebral atrophy with widening of the extra-axial spaces and ventricular dilatation. There are areas of decreased attenuation within the white matter tracts of the supratentorial brain, consistent with microvascular disease changes. A large area of cortical encephalomalacia, with adjacent chronic white matter low attenuation, is seen within the right occipital lobe. Similar appearing areas are seen within the left frontal lobe and posterior parietal region on the left. These areas are seen on the prior MRI. Vascular: No hyperdense vessel or unexpected calcification. Skull: Normal. Negative for fracture or focal lesion. Sinuses/Orbits: No acute finding. Other: None. IMPRESSION: 1. No acute intracranial process is seen. 2. Chronic infarcts within the right occipital lobe, left frontal lobe and posterior parietal region on the left. Electronically Signed   By: Aram Candela M.D.   On: 03/09/2019 17:25   CT ABDOMEN PELVIS W CONTRAST  Result Date: 03/09/2019 CLINICAL DATA:  Weakness and low blood sugar. EXAM: CT ABDOMEN AND PELVIS WITH CONTRAST TECHNIQUE: Multidetector CT imaging of the abdomen and pelvis was performed using the standard protocol following bolus administration of intravenous contrast. CONTRAST:  OMNIPAQUE IOHEXOL 300 MG/ML  SOLN COMPARISON:  None. FINDINGS: Lower chest: Very mild areas of atelectasis and/or infiltrate are seen within the bilateral lung bases. Small bilateral pleural effusions are seen. Hepatobiliary: No focal liver abnormality is seen. No gallstones, gallbladder wall thickening, or biliary dilatation. Pancreas: Unremarkable. No pancreatic ductal dilatation or surrounding inflammatory changes. Spleen: Normal in size without focal abnormality. Adrenals/Urinary Tract: Adrenal glands are unremarkable. Kidneys  are normal, without  renal calculi, focal lesion, or hydronephrosis. Bladder is unremarkable. Stomach/Bowel: Stomach is within normal limits. Appendix appears normal. No evidence of bowel wall thickening, distention, or inflammatory changes. Vascular/Lymphatic: Mild to moderate severity aortic atherosclerosis. No enlarged abdominal or pelvic lymph nodes. Reproductive: Prostate is unremarkable. Other: There is a mild amount of perihepatic fluid. Musculoskeletal: Chronic posterolateral seventh and eighth right rib fractures are seen. Chronic posterolateral eighth, ninth and tenth left rib fractures are also noted. Multilevel degenerative changes are seen throughout the lumbar spine. IMPRESSION: 1. Very mild areas of atelectasis and/or infiltrate within the bilateral lung bases with small bilateral pleural effusions. 2. Mild amount of perihepatic fluid. 3. Mild to moderate severity aortic atherosclerosis. 4. Chronic bilateral rib fractures. Aortic Atherosclerosis (ICD10-I70.0). Electronically Signed   By: Aram Candela M.D.   On: 03/09/2019 17:30   US Venous Img Lower Bilateral (DVT)  Result Date: 03/09/2019 CLINICAL DATA:  Leg swelling x4 months. EXAM: BILATERAL LOWER EXTREMITY VENOUS DOPPLER ULTRASOUND TECHNIQUE: Gray-scale sonography with graded compression, as well as color Doppler and duplex ultrasound were performed to evaluate the lower extremity deep venous systems from the level of the common femoral vein and including the common femoral, femoral, profunda femoral, popliteal and calf veins including the posterior tibial, peroneal and gastrocnemius veins when visible. The superficial great saphenous vein was also interrogated. Spectral Doppler was utilized to evaluate flow at rest and with distal augmentation maneuvers in the common femoral, femoral and popliteal veins. COMPARISON:  None. FINDINGS: RIGHT LOWER EXTREMITY Common Femoral Vein: No evidence of thrombus. Normal compressibility, respiratory phasicity and response  to augmentation. Saphenofemoral Junction: No evidence of thrombus. Normal compressibility and flow on color Doppler imaging. Profunda Femoral Vein: No evidence of thrombus. Normal compressibility and flow on color Doppler imaging. Femoral Vein: No evidence of thrombus. Normal compressibility, respiratory phasicity and response to augmentation. Popliteal Vein: No evidence of thrombus. Normal compressibility, respiratory phasicity and response to augmentation. Calf Veins: No evidence of thrombus. Normal compressibility and flow on color Doppler imaging. Superficial Great Saphenous Vein: No evidence of thrombus. Normal compressibility. Venous Reflux:  None. Other Findings:  None. LEFT LOWER EXTREMITY Common Femoral Vein: No evidence of thrombus. Normal compressibility, respiratory phasicity and response to augmentation. Saphenofemoral Junction: No evidence of thrombus. Normal compressibility and flow on color Doppler imaging. Profunda Femoral Vein: No evidence of thrombus. Normal compressibility and flow on color Doppler imaging. Femoral Vein: No evidence of thrombus. Normal compressibility, respiratory phasicity and response to augmentation. Popliteal Vein: No evidence of thrombus. Normal compressibility, respiratory phasicity and response to augmentation. Calf Veins: No evidence of thrombus. Normal compressibility and flow on color Doppler imaging. Superficial Great Saphenous Vein: No evidence of thrombus. Normal compressibility. Venous Reflux:  None. Other Findings:  None. IMPRESSION: No evidence of deep venous thrombosis in either lower extremity. Electronically Signed   By: Aram Candela M.D.   On: 03/09/2019 17:44   DG Chest 1V REPEAT Same Day  Result Date: 03/14/2019 CLINICAL DATA:  Left central line placement, history of diabetes, previous tobacco abuse EXAM: CHEST - 1 VIEW SAME DAY COMPARISON:  03/14/2019 FINDINGS: Frontal view of the chest demonstrates left-sided central venous catheter tip overlying  brachiocephalic confluence. Cardiac silhouette is unremarkable. The bibasilar ground-glass airspace disease and small bilateral pleural effusion seen on prior chest CT are less apparent by radiograph. No acute bony abnormalities. IMPRESSION: 1. No complication after left-sided central line placement. 2. Otherwise stable exam. Electronically Signed   By: Maxwell Caul.D.  On: 03/14/2019 15:10   DG CHEST PORT 1 VIEW  Result Date: 03/14/2019 CLINICAL DATA:  Pneumonia, negative COVID test. EXAM: PORTABLE CHEST 1 VIEW COMPARISON:  CT chest 03/13/2019 and chest radiograph 03/13/2019. FINDINGS: Trachea is midline. Heart is enlarged, as before. Mild bibasilar airspace opacification, right greater than left, as on yesterday's CT chest. Lungs are otherwise clear. Small right pleural effusion. IMPRESSION: Mild bibasilar airspace opacification, right greater than left, and small right pleural effusion, findings which may be due to mild congestive heart failure. Pneumonia, including COVID-19 pneumonia, not excluded. Electronically Signed   By: Leanna Battles M.D.   On: 03/14/2019 09:52   DG Chest Portable 1 View  Result Date: 03/13/2019 CLINICAL DATA:  Larey Seat, right rib pain, hypertension, diabetes EXAM: PORTABLE CHEST 1 VIEW COMPARISON:  03/09/2019 FINDINGS: Single frontal view of the chest demonstrates a stable cardiac silhouette. No airspace disease, effusion, or pneumothorax. Chronic right posterior seventh rib fracture. IMPRESSION: No active disease. Electronically Signed   By: Sharlet Salina M.D.   On: 03/13/2019 10:59   DG Chest Port 1 View  Result Date: 03/09/2019 CLINICAL DATA:  Weakness and low blood sugar. EXAM: PORTABLE CHEST 1 VIEW COMPARISON:  11/20/2018 FINDINGS: Patient slightly rotated to the left. Lungs are adequately inflated without focal airspace consolidation or effusion. Mild stable cardiomegaly. Remainder of the exam is unchanged. IMPRESSION: No acute cardiopulmonary disease. Mild stable  cardiomegaly. Electronically Signed   By: Elberta Fortis M.D.   On: 03/09/2019 16:00   VAS [place] ABI WITH/WO TBI  Result Date: 03/17/2019 LOWER EXTREMITY DOPPLER STUDY High Risk         Hypertension, hyperlipidemia, Diabetes, past history of Factors:          smoking.  Comparison Study: 06/24/2016 Performing Technologist: Levin Bacon RDMS, RVT  Examination Guidelines: A complete evaluation includes at minimum, Doppler waveform signals and systolic blood pressure reading at the level of bilateral brachial, anterior tibial, and posterior tibial arteries, when vessel segments are accessible. Bilateral testing is considered an integral part of a complete examination. Photoelectric Plethysmograph (PPG) waveforms and toe systolic pressure readings are included as required and additional duplex testing as needed. Limited examinations for reoccurring indications may be performed as noted.  ABI Findings: +---------+------------------+-----+---------+--------+ Right    Rt Pressure (mmHg)IndexWaveform Comment  +---------+------------------+-----+---------+--------+ Brachial 101                    triphasic         +---------+------------------+-----+---------+--------+ PTA      51                0.47 audible           +---------+------------------+-----+---------+--------+ DP       79                0.73 audible           +---------+------------------+-----+---------+--------+ Great Toe26                0.24                   +---------+------------------+-----+---------+--------+ +---------+------------------+-----+---------+-------+ Left     Lt Pressure (mmHg)IndexWaveform Comment +---------+------------------+-----+---------+-------+ Brachial 108                    triphasic        +---------+------------------+-----+---------+-------+ PTA      85                0.79 biphasic         +---------+------------------+-----+---------+-------+  DP       108               1.00  triphasic        +---------+------------------+-----+---------+-------+ Great Toe58                0.54 Normal           +---------+------------------+-----+---------+-------+ +-------+-----------+-----------+------------+------------+ ABI/TBIToday's ABIToday's TBIPrevious ABIPrevious TBI +-------+-----------+-----------+------------+------------+ Right  0.73                                           +-------+-----------+-----------+------------+------------+ Left   1.00                                           +-------+-----------+-----------+------------+------------+  Summary: Right: Resting right ankle-brachial index indicates moderate right lower extremity arterial disease. RT great toe pressure = 26 mmHg. Left: Resting left ankle-brachial index is within normal range. No evidence of significant left lower extremity arterial disease. LT Great toe pressure = 58 mmHg.  *See table(s) above for measurements and observations.    Preliminary    ECHOCARDIOGRAM COMPLETE  Result Date: 03/14/2019   ECHOCARDIOGRAM REPORT   Patient Name:   DEMARQUES PILZ Date of Exam: 03/14/2019 Medical Rec #:  [id number]      Height:       72.0 in Accession #:    [id number]     Weight:       237.9 lb Date of Birth:  Jan 24, 1956       BSA:          2.29 m Patient Age:    63 years       BP:           88/72 mmHg Patient Gender: M              HR:           110 bpm. [facility] Procedure: 2D Echo Indications:    Cardiomyopathy-Unspecified 425.9 / I42.9                 Elevated Troponin  History:        Patient has no prior history of Echocardiogram examinations.                 Stroke, Arrythmias:Atrial Fibrillation; Risk Factors:Former                 Smoker, Dyslipidemia and Diabetes. Sepsis.  Sonographer:    Jeryl Columbia RDCS (AE) Referring Phys: [id number] Ellsworth Lennox IMPRESSIONS  1. Left ventricular ejection fraction, by visual estimation, is 20 to 25%. The left ventricle has severely  decreased function. There is no left ventricular hypertrophy.  2. Definity contrast agent was given IV to delineate the left ventricular endocardial borders.  3. Elevated left ventricular end-diastolic pressure.  4. Indeterminate diastolic filling due to E-A fusion.  5. The left ventricle demonstrates global hypokinesis.  6. Global right ventricle has severely reduced systolic function.The right ventricular size is mildly enlarged. No increase in right ventricular wall thickness.  7. Left atrial size was normal.  8. Right atrial size was mildly dilated.  9. Mild mitral annular calcification. 10. The mitral valve is grossly normal. Mild mitral valve regurgitation. 11. The tricuspid valve is normal in  structure. 12. The tricuspid valve is normal in structure. Tricuspid valve regurgitation is mild. 13. The aortic valve is tricuspid. Aortic valve regurgitation is not visualized. No evidence of aortic valve sclerosis or stenosis. 14. The pulmonic valve was grossly normal. Pulmonic valve regurgitation is not visualized. 15. Moderately elevated pulmonary artery systolic pressure. 16. The inferior vena cava is dilated in size with >50% respiratory variability, suggesting right atrial pressure of 8 mmHg. FINDINGS  Left Ventricle: Left ventricular ejection fraction, by visual estimation, is 20 to 25%. The left ventricle has severely decreased function. Definity contrast agent was given IV to delineate the left ventricular endocardial borders. The left ventricle demonstrates global hypokinesis. There is no left ventricular hypertrophy. Indeterminate diastolic filling due to E-A fusion. Elevated left ventricular end-diastolic pressure. Right Ventricle: The right ventricular size is mildly enlarged. No increase in right ventricular wall thickness. Global RV systolic function is has severely reduced systolic function. The tricuspid regurgitant velocity is 3.24 m/s, and with an assumed right atrial pressure of 10 mmHg, the  estimated right ventricular systolic pressure is moderately elevated at 52.0 mmHg. Left Atrium: Left atrial size was normal in size. Right Atrium: Right atrial size was mildly dilated Pericardium: There is no evidence of pericardial effusion. Mitral Valve: The mitral valve is grossly normal. Mildly decreased mobility of the mitral valve leaflets. Mild mitral annular calcification. Mild mitral valve regurgitation. Tricuspid Valve: The tricuspid valve is normal in structure. Tricuspid valve regurgitation is mild. Aortic Valve: The aortic valve is tricuspid. Aortic valve regurgitation is not visualized. The aortic valve is structurally normal, with no evidence of sclerosis or stenosis. Pulmonic Valve: The pulmonic valve was grossly normal. Pulmonic valve regurgitation is not visualized. Pulmonic regurgitation is not visualized. Aorta: The aortic root is normal in size and structure. Venous: The inferior vena cava is dilated in size with greater than 50% respiratory variability, suggesting right atrial pressure of 8 mmHg. IAS/Shunts: No atrial level shunt detected by color flow Doppler.  LEFT VENTRICLE PLAX 2D LVIDd:         4.80 cm  Diastology LVIDs:         4.25 cm  LV e' lateral:   6.50 cm/s LV PW:         1.16 cm  LV E/e' lateral: 10.4 LV IVS:        1.06 cm  LV e' medial:    4.31 cm/s LVOT diam:     2.00 cm  LV E/e' medial:  15.8 LV SV:         27 ml LV SV Index:   11.26 LVOT Area:     3.14 cm  RIGHT VENTRICLE RV S prime:     4.62 cm/s TAPSE (M-mode): 0.8 cm LEFT ATRIUM             Index       RIGHT ATRIUM           Index LA diam:        4.90 cm 2.14 cm/m  RA Area:     19.60 cm LA Vol (A2C):   72.7 ml 31.70 ml/m RA Volume:   67.70 ml  29.52 ml/m LA Vol (A4C):   64.7 ml 28.21 ml/m LA Biplane Vol: 70.2 ml 30.61 ml/m   AORTA Ao Root diam: 2.80 cm MITRAL VALVE                        TRICUSPID VALVE MV Area (PHT): 4.21 cm  TR Peak grad:   42.0 mmHg MV PHT:        52.20 msec           TR Vmax:         324.00 cm/s MV Decel Time: 180 msec MR Peak grad: 58.1 mmHg             SHUNTS MR Mean grad: 41.0 mmHg             Systemic Diam: 2.00 cm MR Vmax:      381.00 cm/s MR Vmean:     304.0 cm/s MV E velocity: 67.90 cm/s 103 cm/s MV A velocity: 20.90 cm/s 70.3 cm/s MV E/A ratio:  3.25       1.5  Kate Sable MD Electronically signed by Kate Sable MD Signature Date/Time: 03/14/2019/11:15:54 AM    Final    CT Angio Chest/Abd/Pel for Dissection W and/or Wo Contrast  Result Date: 03/13/2019 CLINICAL DATA:  Abdominal pain, remote fall EXAM: CT ANGIOGRAPHY CHEST, ABDOMEN AND PELVIS TECHNIQUE: Multidetector CT imaging through the chest, abdomen and pelvis was performed using the standard protocol during bolus administration of intravenous contrast. Multiplanar reconstructed images and MIPs were obtained and reviewed to evaluate the vascular anatomy. CONTRAST:  141mL OMNIPAQUE IOHEXOL 350 MG/ML SOLN COMPARISON:  None. FINDINGS: CTA CHEST FINDINGS Cardiovascular: Preferential opacification of the thoracic aorta. No evidence of thoracic aortic aneurysm or dissection. Normal heart size. No pericardial effusion. Thoracic aortic atherosclerosis. Multi vessel coronary artery atherosclerosis. Mediastinum/Nodes: No enlarged mediastinal, hilar, or axillary lymph nodes. Thyroid gland, trachea, and esophagus demonstrate no significant findings. Lungs/Pleura: Small bilateral pleural effusions, right greater than left. Right lower lobe patchy airspace disease. No pneumothorax. Musculoskeletal: No chest wall abnormality. No acute or significant osseous findings. Review of the MIP images confirms the above findings. CTA ABDOMEN AND PELVIS FINDINGS VASCULAR Aorta: Normal caliber aorta without aneurysm, dissection, vasculitis or significant stenosis. Abdominal aortic atherosclerosis. Celiac: Patent without evidence of aneurysm, dissection, vasculitis or significant stenosis. SMA: Patent without evidence of aneurysm, dissection,  vasculitis or significant stenosis. Renals: Both renal arteries are patent without evidence of aneurysm, dissection, vasculitis, fibromuscular dysplasia or significant stenosis. IMA: Patent without evidence of aneurysm, dissection, vasculitis or significant stenosis. Inflow: Patent without evidence of aneurysm, dissection, vasculitis or significant stenosis. Veins: No obvious venous abnormality within the limitations of this arterial phase study. Review of the MIP images confirms the above findings. NON-VASCULAR Hepatobiliary: No focal liver abnormality is seen. No gallstones, gallbladder wall thickening, or biliary dilatation. Pancreas: Unremarkable. No pancreatic ductal dilatation or surrounding inflammatory changes. Spleen: Normal in size without focal abnormality. Adrenals/Urinary Tract: Adrenal glands are unremarkable. Kidneys are normal, without renal calculi, focal lesion, or hydronephrosis. Bladder is unremarkable. Stomach/Bowel: Stomach is within normal limits. Appendix appears normal. No evidence of bowel wall thickening, distention, or inflammatory changes. Lymphatic: No lymphadenopathy. Reproductive: Prostate is unremarkable. Other: Small amount of abdominal and pelvic free fluid. Mild anasarca. Musculoskeletal: No acute osseous abnormality. No aggressive osseous lesion. Review of the MIP images confirms the above findings. IMPRESSION: 1. No evidence of an aortic dissection.  No aortic aneurysm. 2.  Aortic Atherosclerosis (ICD10-I70.0). 3. Right lower lobe pneumonia. 4. Small amount of pelvic free fluid. Electronically Signed   By: Kathreen Devoid   On: 03/13/2019 11:55    Lab Data:  CBC: Recent Labs  Lab 03/13/19 1056 03/14/19 0605 03/16/19 0854 03/17/19 0500  WBC 16.3* 13.8* 13.7* 12.3*  NEUTROABS 13.0*  --   --   --  HGB 14.8 14.9 14.7 13.7  HCT 45.7 47.2 44.8 40.8  MCV 95.8 96.1 93.7 91.7  PLT 300 271 160 182   Basic Metabolic Panel: Recent Labs  Lab 03/14/19 1233 03/15/19 0243  03/15/19 0415 03/16/19 0419 03/16/19 1500 03/17/19 0500  NA 124* 130*  --  137 134* 133*  K 5.0 4.4  --  2.8* 3.7 3.6  CL 94* 101  --  100 98 97*  CO2 16* 16*  --  25 24 28   GLUCOSE 173* 137*  --  99 234* 150*  BUN 35* 35*  --  26* 22 19  CREATININE 1.21 1.17  --  0.79 0.90 0.68  CALCIUM 8.1* 8.5*  --  8.3* 7.6* 7.9*  MG  --   --  2.1  --   --   --    GFR: Estimated Creatinine Clearance: 119.6 mL/min (by C-G formula based on SCr of 0.68 mg/dL). Liver Function Tests: Recent Labs  Lab 03/13/19 1056 03/14/19 0605 03/14/19 1233 03/16/19 0419 03/17/19 0500  AST 1,189* 1,500* 1,488* 820* 533*  ALT 939* 1,187* 1,311* 1,313* 1,080*  ALKPHOS 329* 297* 301* 216* 248*  BILITOT 2.7* 3.1* 3.2* 2.6* 2.3*  PROT 7.4 6.2* 6.1* 4.8* 5.0*  ALBUMIN 4.0 3.3* 3.3* 2.4* 2.3*   No results for input(s): LIPASE, AMYLASE in the last 168 hours. No results for input(s): AMMONIA in the last 168 hours. Coagulation Profile: Recent Labs  Lab 03/13/19 1056 03/14/19 0605 03/14/19 1233 03/16/19 0419 03/17/19 0500  INR 1.6* 2.2* 2.6* 2.6* 1.9*   Cardiac Enzymes: Recent Labs  Lab 03/13/19 1056  CKTOTAL 79   BNP (last 3 results) No results for input(s): PROBNP in the last 8760 hours. HbA1C: No results for input(s): HGBA1C in the last 72 hours. CBG: Recent Labs  Lab 03/16/19 1511 03/16/19 2137 03/17/19 0340 03/17/19 0755 03/17/19 1108  GLUCAP 228* 182* 150* 129* 190*   Lipid Profile: No results for input(s): CHOL, HDL, LDLCALC, TRIG, CHOLHDL, LDLDIRECT in the last 72 hours. Thyroid Function Tests: No results for input(s): TSH, T4TOTAL, FREET4, T3FREE, THYROIDAB in the last 72 hours. Anemia Panel: No results for input(s): VITAMINB12, FOLATE, FERRITIN, TIBC, IRON, RETICCTPCT in the last 72 hours. Urine analysis:    Component Value Date/Time   COLORURINE AMBER (A) 03/14/2019 0600   APPEARANCEUR HAZY (A) 03/14/2019 0600   LABSPEC 1.032 (H) 03/14/2019 0600   PHURINE 5.0 03/14/2019 0600    GLUCOSEU >=500 (A) 03/14/2019 0600   HGBUR NEGATIVE 03/14/2019 0600   BILIRUBINUR NEGATIVE 03/14/2019 0600   KETONESUR 5 (A) 03/14/2019 0600   PROTEINUR 30 (A) 03/14/2019 0600   NITRITE NEGATIVE 03/14/2019 0600   LEUKOCYTESUR NEGATIVE 03/14/2019 0600     05/12/2019 M.D. [facility] 03/17/2019, 4:35 PM  Pager: 05/15/2019 Between 7am to 7pm - call Pager - [phone]  After 7pm go to [email or web address] - password [email or web address]  Call night coverage person covering after 7pm

## 2019-03-18 LAB — GLUCOSE, CAPILLARY
Glucose-Capillary: 114 mg/dL — ABNORMAL HIGH (ref 70–99)
Glucose-Capillary: 134 mg/dL — ABNORMAL HIGH (ref 70–99)
Glucose-Capillary: 164 mg/dL — ABNORMAL HIGH (ref 70–99)
Glucose-Capillary: 184 mg/dL — ABNORMAL HIGH (ref 70–99)
Glucose-Capillary: 189 mg/dL — ABNORMAL HIGH (ref 70–99)
Glucose-Capillary: 200 mg/dL — ABNORMAL HIGH (ref 70–99)

## 2019-03-18 LAB — CULTURE, BLOOD (ROUTINE X 2)
Culture: NO GROWTH
Culture: NO GROWTH

## 2019-03-18 LAB — BASIC METABOLIC PANEL
Anion gap: 7 (ref 5–15)
BUN: 14 mg/dL (ref 8–23)
CO2: 29 mmol/L (ref 22–32)
Calcium: 7.8 mg/dL — ABNORMAL LOW (ref 8.9–10.3)
Chloride: 93 mmol/L — ABNORMAL LOW (ref 98–111)
Creatinine, Ser: 0.65 mg/dL (ref 0.61–1.24)
GFR calc Af Amer: >60 mL/min (ref >60)
GFR calc non Af Amer: >60 mL/min (ref >60)
Glucose, Bld: 155 mg/dL — ABNORMAL HIGH (ref 70–99)
Potassium: 3.5 mmol/L (ref 3.5–5.1)
Sodium: 129 mmol/L — ABNORMAL LOW (ref 135–145)

## 2019-03-18 LAB — COOXEMETRY PANEL
Carboxyhemoglobin: 1.8 % — ABNORMAL HIGH (ref 0.5–1.5)
Carboxyhemoglobin: 2.1 % — ABNORMAL HIGH (ref 0.5–1.5)
Methemoglobin: 1 % (ref 0.0–1.5)
Methemoglobin: 1.1 % (ref 0.0–1.5)
O2 Saturation: 60.1 %
O2 Saturation: 71.3 %
Total hemoglobin: 14 g/dL (ref 12.0–16.0)
Total hemoglobin: 14.1 g/dL (ref 12.0–16.0)

## 2019-03-18 LAB — CBC
HCT: 41.5 % (ref 39.0–52.0)
Hemoglobin: 13.7 g/dL (ref 13.0–17.0)
MCH: 30.6 pg (ref 26.0–34.0)
MCHC: 33 g/dL (ref 30.0–36.0)
MCV: 92.6 fL (ref 80.0–100.0)
Platelets: 158 10*3/uL (ref 150–400)
RBC: 4.48 MIL/uL (ref 4.22–5.81)
RDW: 13.5 % (ref 11.5–15.5)
WBC: 10.6 10*3/uL — ABNORMAL HIGH (ref 4.0–10.5)
nRBC: 0.2 % (ref 0.0–0.2)

## 2019-03-18 LAB — HEPATIC FUNCTION PANEL
ALT: 957 U/L — ABNORMAL HIGH (ref 0–44)
AST: 480 U/L — ABNORMAL HIGH (ref 15–41)
Albumin: 2.5 g/dL — ABNORMAL LOW (ref 3.5–5.0)
Alkaline Phosphatase: 231 U/L — ABNORMAL HIGH (ref 38–126)
Bilirubin, Direct: 1 mg/dL — ABNORMAL HIGH (ref 0.0–0.2)
Indirect Bilirubin: 1.6 mg/dL — ABNORMAL HIGH (ref 0.3–0.9)
Total Bilirubin: 2.6 mg/dL — ABNORMAL HIGH (ref 0.3–1.2)
Total Protein: 5.4 g/dL — ABNORMAL LOW (ref 6.5–8.1)

## 2019-03-18 LAB — MAGNESIUM: Magnesium: 1.7 mg/dL (ref 1.7–2.4)

## 2019-03-18 MED ORDER — FUROSEMIDE 10 MG/ML IJ SOLN
80.0000 mg | Freq: Two times a day (BID) | INTRAMUSCULAR | Status: DC
  Administered 2019-03-18 – 2019-03-20 (×4): 80 mg via INTRAVENOUS
  Filled 2019-03-18 (×4): qty 8

## 2019-03-18 MED ORDER — MILRINONE LACTATE IN DEXTROSE 20-5 MG/100ML-% IV SOLN
0.1250 ug/kg/min | INTRAVENOUS | Status: DC
  Administered 2019-03-18: 0.25 ug/kg/min via INTRAVENOUS
  Administered 2019-03-19: 0.125 ug/kg/min via INTRAVENOUS
  Administered 2019-03-19: 0.25 ug/kg/min via INTRAVENOUS
  Filled 2019-03-18 (×3): qty 100

## 2019-03-18 MED ORDER — ASPIRIN 81 MG PO CHEW
81.0000 mg | CHEWABLE_TABLET | ORAL | Status: AC
  Administered 2019-03-19: 81 mg via ORAL
  Filled 2019-03-18: qty 1

## 2019-03-18 MED ORDER — SODIUM CHLORIDE 0.9% FLUSH: 3.0000 mL | INTRAVENOUS | Status: DC | PRN

## 2019-03-18 MED ORDER — MAGNESIUM SULFATE 2 GM/50ML IV SOLN
2.0000 g | Freq: Once | INTRAVENOUS | Status: AC
  Administered 2019-03-18: 2 g via INTRAVENOUS
  Filled 2019-03-18: qty 50

## 2019-03-18 MED ORDER — POTASSIUM CHLORIDE CRYS ER 20 MEQ PO TBCR
40.0000 meq | EXTENDED_RELEASE_TABLET | Freq: Two times a day (BID) | ORAL | Status: DC
  Administered 2019-03-18 – 2019-03-21 (×8): 40 meq via ORAL
  Filled 2019-03-18 (×8): qty 2

## 2019-03-18 MED ORDER — SODIUM CHLORIDE 0.9 % IV SOLN: INTRAVENOUS | Status: DC

## 2019-03-18 MED ORDER — FUROSEMIDE 10 MG/ML IJ SOLN
80.0000 mg | Freq: Once | INTRAMUSCULAR | Status: AC
  Administered 2019-03-18: 80 mg via INTRAVENOUS
  Filled 2019-03-18: qty 8

## 2019-03-18 MED ORDER — SODIUM CHLORIDE 0.9 % IV SOLN: 250.0000 mL | INTRAVENOUS | Status: DC | PRN

## 2019-03-18 NOTE — Progress Notes (Signed)
Patient ID: Matthew Mccormick, male   DOB: [birth year], 64 y.o.   MRN: [id number]     Advanced Heart Failure Rounding Note  PCP-Cardiologist: Kate Sable, MD   Subjective:    Dobutamine increased from 3->5 on 2/5. Developed HRs > 120 yesterday and dobutamine turned down to 4. HR still in 120s. CVP 17-18 (checked personally)  Denies CP or SOB. Feels ok. Co-ox 60%   Objective:   Weight Range: 107.6 kg Body mass index is 32.17 kg/m.   Vital Signs:   Temp:  [97.4 F (36.3 C)-99.2 F (37.3 C)] 97.4 F (36.3 C) (02/07 0818) Pulse Rate:  [106-125] 116 (02/07 0818) Resp:  [14-21] 16 (02/07 1231) BP: (98-104)/(69-79) 104/75 (02/07 0911) SpO2:  [97 %-99 %] 97 % (02/07 0818) Weight:  [107.6 kg] 107.6 kg (02/07 0411)    Weight change: Filed Weights   03/16/19 0429 03/17/19 0333 03/18/19 0411  Weight: 104.6 kg 107.3 kg 107.6 kg    Intake/Output:   Intake/Output Summary (Last 24 hours) at 03/18/2019 1336 Last data filed at 03/18/2019 1117 Gross per 24 hour  Intake 1680 ml  Output 3120 ml  Net -1440 ml      Physical Exam    General:  Sitting up in bed. Weak appearing. No respiratory difficulty HEENT: normal Neck: supple.JVP to ear. Carotids 2+ bilat; no bruits. No lymphadenopathy or thryomegaly appreciated. Cor: PMI nondisplaced. Tachy. Regular +s3 Lungs: clear Abdomen: soft, nontender, nondistended. No hepatosplenomegaly. No bruits or masses. Good bowel sounds. Extremities: no cyanosis, clubbing, rash, 2+ edema Neuro: alert & orientedx3, cranial nerves grossly intact. moves all 4 extremities w/o difficulty. Affect pleasant + dysarthric   Telemetry   NSR 120-130s (personally reviewed)  Labs    CBC Recent Labs    03/17/19 0500 03/18/19 0509  WBC 12.3* 10.6*  HGB 13.7 13.7  HCT 40.8 41.5  MCV 91.7 92.6  PLT 182 737   Basic Metabolic Panel Recent Labs    03/17/19 0500 03/18/19 0509  NA 133* 129*  K 3.6 3.5  CL 97* 93*  CO2 28 29  GLUCOSE 150* 155*  BUN  19 14  CREATININE 0.68 0.65  CALCIUM 7.9* 7.8*  MG  --  1.7   Liver Function Tests Recent Labs    03/17/19 0500 03/18/19 0509  AST 533* 480*  ALT 1,080* 957*  ALKPHOS 248* 231*  BILITOT 2.3* 2.6*  PROT 5.0* 5.4*  ALBUMIN 2.3* 2.5*   No results for input(s): LIPASE, AMYLASE in the last 72 hours. Cardiac Enzymes No results for input(s): CKTOTAL, CKMB, CKMBINDEX, TROPONINI in the last 72 hours.  BNP: BNP (last 3 results) Recent Labs    03/14/19 1832  BNP 1,338.6*    ProBNP (last 3 results) No results for input(s): PROBNP in the last 8760 hours.   D-Dimer No results for input(s): DDIMER in the last 72 hours. Hemoglobin A1C No results for input(s): HGBA1C in the last 72 hours. Fasting Lipid Panel No results for input(s): CHOL, HDL, LDLCALC, TRIG, CHOLHDL, LDLDIRECT in the last 72 hours. Thyroid Function Tests No results for input(s): TSH, T4TOTAL, T3FREE, THYROIDAB in the last 72 hours.  Invalid input(s): FREET3  Other results:   Imaging    No results found.   Medications:     Scheduled Medications: . Chlorhexidine Gluconate Cloth  6 each Topical Daily  . clopidogrel  75 mg Oral Daily  . digoxin  0.125 mg Oral Daily  . enoxaparin (LOVENOX) injection  55 mg Subcutaneous Q24H  .  insulin aspart  0-15 Units Subcutaneous Q4H  . mouth rinse  15 mL Mouth Rinse BID  . potassium chloride  40 mEq Oral BID  . [START ON 03/19/2019] sodium chloride flush  3 mL Intravenous Q12H  . spironolactone  12.5 mg Oral Daily    Infusions: . sodium chloride    . DOBUTamine 4 mcg/kg/min (03/17/19 2308)  . magnesium sulfate bolus IVPB      PRN Medications: sodium chloride, ondansetron **OR** ondansetron (ZOFRAN) IV, Resource ThickenUp Clear    Assessment/Plan   1. Shock: Suspect primarily cardiogenic shock.  Prior echo in 2018 with EF 30-35%, now echo with EF down to 10-15% with severe RV dysfunction. Cause of cardiomyopathy uncertain though ECG is suggestive of prior  MI with anterior Qs.  No ACS this admission (HS-TnI in the 100s range with no trend). He is now off norepinephrine with improved co-ox (60%) on dobutamine 4 (decreased from 4 overnight due to tachycardia). Remains markedly volume overloaded with CVP 17 and prominent s3. He is extremely tenuous from cardiac perspective - Switch dobutamine to milrinone 0.25 due to tachycardia  - Lasix fell off MAR on 2/5. Will restart at 80 IV bid. Continue metolazone - Continue digoxin 0.125 daily.   - Continue spironolactone 12.5 daily.   - RHC/LHC when further diuresed. Will likely not be ready by tomorrow 2. ID: Initial concern for RLL PNA on CT chest with septic shock. However, afebrile and WBCs only mildly elevated. Cultures remains negative. Afebrile  - He is currently off abx (per CCM).  3. Elevated LFTs: Suspect due to shock liver with hypotension.  LFTs now trending down.   4. H/o CVA: Residual left-sided weakness as well as some cognitive dysfunction per notes.  However, he lives on his own.  - He continues on Plavix.  - Can start statin as LFTs trend down.  5. Hyponatremia: Hypervolemic hyponatremia, resolved.  6. PAD: Feet with possible ischemic changes.   - Will need peripheral arterial dopplers.  7. Hypomagnesemia/hypokalemia - supp  Length of Stay: 5  Arvilla Meres, MD  03/18/2019, 1:36 PM  Advanced Heart Failure Team Pager [phone] (M-F; 7a - 4p)  Please contact [facility] for night-coverage after hours (4p -7a ) and weekends on [email or web address]

## 2019-03-18 NOTE — Progress Notes (Signed)
[facility]                                                                              Patient Demographics  Matthew Mccormick, is a 64 y.o. male, DOB - [birth year], ZCH:[id number]  Admit date - 03/13/2019   Admitting Physician Wilson Singer, MD  Outpatient Primary MD for the patient is Kirstie Peri, MD  Outpatient specialists:   LOS - 5  days    Chief Complaint  Patient presents with  . Fall       Brief summary  64 year old gentleman with a history of atrial fibrillation, diabetes hypertension who presented to [facility] with several days of lethargy malaise, nausea and vomiting, and admitted for cardiogenic shock for which reason he was transferred to [facility] for further treatment  Assessment & Plan    Active Problems:   Sepsis (HCC)   Pressure injury of skin   Ischemic hepatitis  Cardiogenic shock On dobutamine dose titration per Cardiology team Advanced heart failure team directing care at this time  Acute metabolic encephalopathy Felt to be due to cardiogenic shock Supportive care  Acute hypoxemic respiratory failure Secondary to acute pulmonary edema Chest x-ray negative for pneumonia-being monitored off antibiotics per pulmonary medicine Continue diuresis   Hypertension Antihypertensive regimen on hold for now obviously due to cardiogenic shock  Type 2 diabetes mellitus Oral hypoglycemics on hold Continue sliding scale with monitoring of glucose-may introduce home Lantus as needed for glycemic control   History of CVA Continue Plavix  Transaminasemia Likely due to hepatic congestion Follow clinically and avoid hepatotoxics      Disposition Plan: TBD  Time Spent in minutes 25 minutes  Procedures:    Consultants:   Pulmonary critical care Advanced heart failure cardiology  Antimicrobials:      Medications  Scheduled Meds: . Chlorhexidine Gluconate Cloth  6 each Topical Daily  . clopidogrel  75  mg Oral Daily  . digoxin  0.125 mg Oral Daily  . enoxaparin (LOVENOX) injection  55 mg Subcutaneous Q24H  . insulin aspart  0-15 Units Subcutaneous Q4H  . mouth rinse  15 mL Mouth Rinse BID  . potassium chloride  40 mEq Oral BID  . [START ON 03/19/2019] sodium chloride flush  3 mL Intravenous Q12H  . spironolactone  12.5 mg Oral Daily   Continuous Infusions: . sodium chloride    . DOBUTamine 4 mcg/kg/min (03/17/19 2308)   PRN Meds:.sodium chloride, ondansetron **OR** ondansetron (ZOFRAN) IV, Resource ThickenUp Clear   Antibiotics   Anti-infectives (From admission, onward)   Start     Dose/Rate Route Frequency Ordered Stop   03/14/19 1200  cefTRIAXone (ROCEPHIN) 2 g in sodium chloride 0.9 % 100 mL IVPB  Status:  Discontinued     2 g 200 mL/hr over 30 Minutes Intravenous Every 24 hours 03/14/19 0951 03/15/19 0954   03/14/19 1100  azithromycin (ZITHROMAX) 500 mg in sodium chloride 0.9 % 250 mL IVPB  Status:  Discontinued     500 mg 250 mL/hr over 60 Minutes Intravenous Every 24 hours 03/14/19 0951 03/15/19 0954   03/14/19 0000  vancomycin (VANCOREADY) IVPB 1250  mg/250 mL  Status:  Discontinued     1,250 mg 166.7 mL/hr over 90 Minutes Intravenous Every 12 hours 03/13/19 1158 03/14/19 0951   03/13/19 2000  ceFEPIme (MAXIPIME) 2 g in sodium chloride 0.9 % 100 mL IVPB  Status:  Discontinued     2 g 200 mL/hr over 30 Minutes Intravenous Every 8 hours 03/13/19 1058 03/14/19 0951   03/13/19 1030  vancomycin (VANCOCIN) IVPB 1000 mg/200 mL premix  Status:  Discontinued     1,000 mg 200 mL/hr over 60 Minutes Intravenous Every 1 hr x 2 03/13/19 1001 03/13/19 1002   03/13/19 1030  vancomycin (VANCOREADY) IVPB 2000 mg/400 mL     2,000 mg 200 mL/hr over 120 Minutes Intravenous  Once 03/13/19 1002 03/13/19 1343   03/13/19 1000  ceFEPIme (MAXIPIME) 2 g in sodium chloride 0.9 % 100 mL IVPB     2 g 200 mL/hr over 30 Minutes Intravenous  Once 03/13/19 0952 03/13/19 1103   03/13/19 1000   metroNIDAZOLE (FLAGYL) IVPB 500 mg     500 mg 100 mL/hr over 60 Minutes Intravenous  Once 03/13/19 0952 03/13/19 1141   03/13/19 1000  vancomycin (VANCOCIN) IVPB 1000 mg/200 mL premix  Status:  Discontinued     1,000 mg 200 mL/hr over 60 Minutes Intravenous  Once 03/13/19 1761 03/13/19 1001        Subjective:   Gerado Companion was seen and examined today.  Patient denies dizziness, chest pain. No acute events overnight.    Objective:   Vitals:   03/18/19 0604 03/18/19 0759 03/18/19 0818 03/18/19 0911  BP: 101/69  98/72 104/75  Pulse:   (!) 116   Resp: 14 14  18   Temp:   (!) 97.4 F (36.3 C)   TempSrc:   Axillary   SpO2:   97%   Weight:      Height:        Intake/Output Summary (Last 24 hours) at 03/18/2019 1210 Last data filed at 03/18/2019 1117 Gross per 24 hour  Intake 1680 ml  Output 3120 ml  Net -1440 ml     Wt Readings from Last 3 Encounters:  03/18/19 107.6 kg  03/09/19 95.3 kg  11/07/17 98.4 kg     Exam  General: NAD  HEENT: NCAT,  PERRL,MMM  Neck: SUPPLE, (+) JVD  Cardiovascular: mildly tachcardic, (+) S3 GALLOP, (-) MURMUR  Respiratory: CTA  Gastrointestinal: SOFT, (-) DISTENSION, BS(+), (_) TENDERNESS  Ext: (-) CYANOSIS, (2+) EDEMA  Neuro: Alert and responsive, no acute focal deficit     Data Reviewed:  I have personally reviewed following labs and imaging studies  Micro Results Recent Results (from the past 240 hour(s))  Blood Culture (routine x 2)     Status: None   Collection Time: 03/13/19 10:15 AM   Specimen: BLOOD RIGHT FOREARM  Result Value Ref Range Status   Specimen Description BLOOD RIGHT FOREARM DRAWN BY RN BP  Final   Special Requests   Final    Blood Culture results may not be optimal due to an inadequate volume of blood received in culture bottles   Culture   Final    NO GROWTH 5 DAYS Performed at [facility], [address], [place], [place] [address]    Report Status 03/18/2019 FINAL  Final  Blood Culture (routine x  2)     Status: None   Collection Time: 03/13/19 10:20 AM   Specimen: BLOOD LEFT FOREARM  Result Value Ref Range Status   Specimen  Description BLOOD LEFT FOREARM DRAWN BY RN BP  Final   Special Requests   Final    BOTTLES DRAWN AEROBIC AND ANAEROBIC Blood Culture results may not be optimal due to an inadequate volume of blood received in culture bottles   Culture   Final    NO GROWTH 5 DAYS Performed at [facility], [address], [place], [place] [address]    Report Status 03/18/2019 FINAL  Final  Respiratory Panel by RT PCR (Flu A&B, Covid) - Nasopharyngeal Swab     Status: None   Collection Time: 03/13/19 10:58 AM   Specimen: Nasopharyngeal Swab  Result Value Ref Range Status   SARS Coronavirus 2 by RT PCR NEGATIVE NEGATIVE Final    Comment: (NOTE) SARS-CoV-2 target nucleic acids are NOT DETECTED. The SARS-CoV-2 RNA is generally detectable in upper respiratoy specimens during the acute phase of infection. The lowest concentration of SARS-CoV-2 viral copies this assay can detect is 131 copies/mL. A negative result does not preclude SARS-Cov-2 infection and should not be used as the sole basis for treatment or other patient management decisions. A negative result may occur with  improper specimen collection/handling, submission of specimen other than nasopharyngeal swab, presence of viral mutation(s) within the areas targeted by this assay, and inadequate number of viral copies (<131 copies/mL). A negative result must be combined with clinical observations, patient history, and epidemiological information. The expected result is Negative. Fact Sheet for Patients:  https://www.moore.com/ Fact Sheet for Healthcare Providers:  https://www.young.biz/ This test is not yet ap proved or cleared by the [place] FDA and  has been authorized for detection and/or diagnosis of SARS-CoV-2 by FDA under an Emergency Use Authorization (EUA). This EUA  will remain  in effect (meaning this test can be used) for the duration of the COVID-19 declaration under Section 564(b)(1) of the Act, 21 U.S.C. section 360bbb-3(b)(1), unless the authorization is terminated or revoked sooner.    Influenza A by PCR NEGATIVE NEGATIVE Final   Influenza B by PCR NEGATIVE NEGATIVE Final    Comment: (NOTE) The Xpert Xpress SARS-CoV-2/FLU/RSV assay is intended as an aid in  the diagnosis of influenza from Nasopharyngeal swab specimens and  should not be used as a sole basis for treatment. Nasal washings and  aspirates are unacceptable for Xpert Xpress SARS-CoV-2/FLU/RSV  testing. Fact Sheet for Patients: https://www.moore.com/ Fact Sheet for Healthcare Providers: https://www.young.biz/ This test is not yet approved or cleared by the [place] FDA and  has been authorized for detection and/or diagnosis of SARS-CoV-2 by  FDA under an Emergency Use Authorization (EUA). This EUA will remain  in effect (meaning this test can be used) for the duration of the  Covid-19 declaration under Section 564(b)(1) of the Act, 21  U.S.C. section 360bbb-3(b)(1), unless the authorization is  terminated or revoked. Performed at [facility], [address], [place], [place] [address]   MRSA PCR Screening     Status: None   Collection Time: 03/13/19  4:32 PM   Specimen: Nasal Mucosa; Nasopharyngeal  Result Value Ref Range Status   MRSA by PCR NEGATIVE NEGATIVE Final    Comment:        The GeneXpert MRSA Assay (FDA approved for NASAL specimens only), is one component of a comprehensive MRSA colonization surveillance program. It is not intended to diagnose MRSA infection nor to guide or monitor treatment for MRSA infections. Performed at [facility], [address], [place], [place] [address]   Urine culture     Status:  None   Collection Time: 03/14/19  6:00 AM   Specimen: In/Out Cath Urine  Result Value Ref Range Status    Specimen Description   Final    IN/OUT CATH URINE Performed at [facility], [address], [place], [place] [address]    Special Requests   Final    NONE Performed at [facility], [address] [place], [place] [address]    Culture   Final    NO GROWTH Performed at [facility], [place] [address] [place], [place] [address]    Report Status 03/15/2019 FINAL  Final    Radiology Reports CT Head Wo Contrast  Result Date: 03/13/2019 CLINICAL DATA:  Headache EXAM: CT HEAD WITHOUT CONTRAST TECHNIQUE: Contiguous axial images were obtained from the base of the skull through the vertex without intravenous contrast. COMPARISON:  03/09/2019 FINDINGS: Brain: No evidence of acute infarction, hemorrhage, hydrocephalus, extra-axial collection or mass lesion/mass effect. 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 Additional areas of encephalomalacia in the left frontal and parietal lobes are stable from prior. Scattered low-density changes within the periventricular and subcortical white matter compatible with chronic microvascular ischemic change. Mild diffuse cerebral volume loss. Vascular: Mild atherosclerotic calcifications involving the large vessels of the skull base. No unexpected hyperdense vessel. Skull: Normal. Negative for fracture or focal lesion. Sinuses/Orbits: Sinonasal polyps versus mucous retention cysts in the right maxillary and left sphenoid sinuses. Paranasal sinuses are otherwise clear. Orbital structures unremarkable. Other: None. IMPRESSION: No acute intracranial findings.  Stable exam from 03/09/2019. Electronically Signed   By: Davina Poke D.O.   On: 03/13/2019 11:42   CT Head Wo Contrast  Result Date: 03/09/2019 CLINICAL DATA:  Weakness. EXAM: CT HEAD WITHOUT CONTRAST TECHNIQUE: Contiguous axial images were obtained from the base of the skull through the vertex without intravenous contrast. COMPARISON:  Brain MRI, dated November 19, 2017, is available for  comparison. FINDINGS: Brain: There is mild to moderate severity cerebral atrophy with widening of the extra-axial spaces and ventricular dilatation. There are areas of decreased attenuation within the white matter tracts of the supratentorial brain, consistent with microvascular disease changes. A large area of cortical encephalomalacia, with adjacent chronic white matter low attenuation, is seen within the right occipital lobe. Similar appearing areas are seen within the left frontal lobe and posterior parietal region on the left. These areas are seen on the prior MRI. Vascular: No hyperdense vessel or unexpected calcification. Skull: Normal. Negative for fracture or focal lesion. Sinuses/Orbits: No acute finding. Other: None. IMPRESSION: 1. No acute intracranial process is seen. 2. Chronic infarcts within the right occipital lobe, left frontal lobe and posterior parietal region on the left. Electronically Signed   By: Virgina Norfolk M.D.   On: 03/09/2019 17:25   CT ABDOMEN PELVIS W CONTRAST  Result Date: 03/09/2019 CLINICAL DATA:  Weakness and low blood sugar. EXAM: CT ABDOMEN AND PELVIS WITH CONTRAST TECHNIQUE: Multidetector CT imaging of the abdomen and pelvis was performed using the standard protocol following bolus administration of intravenous contrast. CONTRAST:  147mL OMNIPAQUE IOHEXOL 300 MG/ML  SOLN COMPARISON:  None. FINDINGS: Lower chest: Very mild areas of atelectasis and/or infiltrate are seen within the bilateral lung bases. Small bilateral pleural effusions are seen. Hepatobiliary: No focal liver abnormality is seen. No gallstones, gallbladder wall thickening, or biliary dilatation. Pancreas: Unremarkable. No pancreatic ductal dilatation or surrounding inflammatory changes. Spleen: Normal in size without focal abnormality. Adrenals/Urinary Tract: Adrenal glands are unremarkable. Kidneys are normal, without renal calculi, focal lesion,  or hydronephrosis. Bladder is unremarkable. Stomach/Bowel:  Stomach is within normal limits. Appendix appears normal. No evidence of bowel wall thickening, distention, or inflammatory changes. Vascular/Lymphatic: Mild to moderate severity aortic atherosclerosis. No enlarged abdominal or pelvic lymph nodes. Reproductive: Prostate is unremarkable. Other: There is a mild amount of perihepatic fluid. Musculoskeletal: Chronic posterolateral seventh and eighth right rib fractures are seen. Chronic posterolateral eighth, ninth and tenth left rib fractures are also noted. Multilevel degenerative changes are seen throughout the lumbar spine. IMPRESSION: 1. Very mild areas of atelectasis and/or infiltrate within the bilateral lung bases with small bilateral pleural effusions. 2. Mild amount of perihepatic fluid. 3. Mild to moderate severity aortic atherosclerosis. 4. Chronic bilateral rib fractures. Aortic Atherosclerosis (ICD10-I70.0). Electronically Signed   By: Aram Candela M.D.   On: 03/09/2019 17:30   US Venous Img Lower Bilateral (DVT)  Result Date: 03/09/2019 CLINICAL DATA:  Leg swelling x4 months. EXAM: BILATERAL LOWER EXTREMITY VENOUS DOPPLER ULTRASOUND TECHNIQUE: Gray-scale sonography with graded compression, as well as color Doppler and duplex ultrasound were performed to evaluate the lower extremity deep venous systems from the level of the common femoral vein and including the common femoral, femoral, profunda femoral, popliteal and calf veins including the posterior tibial, peroneal and gastrocnemius veins when visible. The superficial great saphenous vein was also interrogated. Spectral Doppler was utilized to evaluate flow at rest and with distal augmentation maneuvers in the common femoral, femoral and popliteal veins. COMPARISON:  None. FINDINGS: RIGHT LOWER EXTREMITY Common Femoral Vein: No evidence of thrombus. Normal compressibility, respiratory phasicity and response to augmentation. Saphenofemoral Junction: No evidence of thrombus. Normal  compressibility and flow on color Doppler imaging. Profunda Femoral Vein: No evidence of thrombus. Normal compressibility and flow on color Doppler imaging. Femoral Vein: No evidence of thrombus. Normal compressibility, respiratory phasicity and response to augmentation. Popliteal Vein: No evidence of thrombus. Normal compressibility, respiratory phasicity and response to augmentation. Calf Veins: No evidence of thrombus. Normal compressibility and flow on color Doppler imaging. Superficial Great Saphenous Vein: No evidence of thrombus. Normal compressibility. Venous Reflux:  None. Other Findings:  None. LEFT LOWER EXTREMITY Common Femoral Vein: No evidence of thrombus. Normal compressibility, respiratory phasicity and response to augmentation. Saphenofemoral Junction: No evidence of thrombus. Normal compressibility and flow on color Doppler imaging. Profunda Femoral Vein: No evidence of thrombus. Normal compressibility and flow on color Doppler imaging. Femoral Vein: No evidence of thrombus. Normal compressibility, respiratory phasicity and response to augmentation. Popliteal Vein: No evidence of thrombus. Normal compressibility, respiratory phasicity and response to augmentation. Calf Veins: No evidence of thrombus. Normal compressibility and flow on color Doppler imaging. Superficial Great Saphenous Vein: No evidence of thrombus. Normal compressibility. Venous Reflux:  None. Other Findings:  None. IMPRESSION: No evidence of deep venous thrombosis in either lower extremity. Electronically Signed   By: Aram Candela M.D.   On: 03/09/2019 17:44   DG Chest 1V REPEAT Same Day  Result Date: 03/14/2019 CLINICAL DATA:  Left central line placement, history of diabetes, previous tobacco abuse EXAM: CHEST - 1 VIEW SAME DAY COMPARISON:  03/14/2019 FINDINGS: Frontal view of the chest demonstrates left-sided central venous catheter tip overlying brachiocephalic confluence. Cardiac silhouette is unremarkable. The  bibasilar ground-glass airspace disease and small bilateral pleural effusion seen on prior chest CT are less apparent by radiograph. No acute bony abnormalities. IMPRESSION: 1. No complication after left-sided central line placement. 2. Otherwise stable exam. Electronically Signed   By: Sharlet Salina M.D.   On: 03/14/2019 15:10  DG CHEST PORT 1 VIEW  Result Date: 03/14/2019 CLINICAL DATA:  Pneumonia, negative COVID test. EXAM: PORTABLE CHEST 1 VIEW COMPARISON:  CT chest 03/13/2019 and chest radiograph 03/13/2019. FINDINGS: Trachea is midline. Heart is enlarged, as before. Mild bibasilar airspace opacification, right greater than left, as on yesterday's CT chest. Lungs are otherwise clear. Small right pleural effusion. IMPRESSION: Mild bibasilar airspace opacification, right greater than left, and small right pleural effusion, findings which may be due to mild congestive heart failure. Pneumonia, including COVID-19 pneumonia, not excluded. Electronically Signed   By: Leanna Battles M.D.   On: 03/14/2019 09:52   DG Chest Portable 1 View  Result Date: 03/13/2019 CLINICAL DATA:  Larey Seat, right rib pain, hypertension, diabetes EXAM: PORTABLE CHEST 1 VIEW COMPARISON:  03/09/2019 FINDINGS: Single frontal view of the chest demonstrates a stable cardiac silhouette. No airspace disease, effusion, or pneumothorax. Chronic right posterior seventh rib fracture. IMPRESSION: No active disease. Electronically Signed   By: Sharlet Salina M.D.   On: 03/13/2019 10:59   DG Chest Port 1 View  Result Date: 03/09/2019 CLINICAL DATA:  Weakness and low blood sugar. EXAM: PORTABLE CHEST 1 VIEW COMPARISON:  11/20/2018 FINDINGS: Patient slightly rotated to the left. Lungs are adequately inflated without focal airspace consolidation or effusion. Mild stable cardiomegaly. Remainder of the exam is unchanged. IMPRESSION: No acute cardiopulmonary disease. Mild stable cardiomegaly. Electronically Signed   By: Elberta Fortis M.D.   On:  03/09/2019 16:00   VAS [place] ABI WITH/WO TBI  Result Date: 03/18/2019 LOWER EXTREMITY DOPPLER STUDY High Risk         Hypertension, hyperlipidemia, Diabetes, past history of Factors:          smoking.  Comparison Study: 06/24/2016 Performing Technologist: Levin Bacon RDMS, RVT  Examination Guidelines: A complete evaluation includes at minimum, Doppler waveform signals and systolic blood pressure reading at the level of bilateral brachial, anterior tibial, and posterior tibial arteries, when vessel segments are accessible. Bilateral testing is considered an integral part of a complete examination. Photoelectric Plethysmograph (PPG) waveforms and toe systolic pressure readings are included as required and additional duplex testing as needed. Limited examinations for reoccurring indications may be performed as noted.  ABI Findings: +---------+------------------+-----+---------+--------+ Right    Rt Pressure (mmHg)IndexWaveform Comment  +---------+------------------+-----+---------+--------+ Brachial 101                    triphasic         +---------+------------------+-----+---------+--------+ PTA      51                0.47 audible           +---------+------------------+-----+---------+--------+ DP       79                0.73 audible           +---------+------------------+-----+---------+--------+ Great Toe26                0.24                   +---------+------------------+-----+---------+--------+ +---------+------------------+-----+---------+-------+ Left     Lt Pressure (mmHg)IndexWaveform Comment +---------+------------------+-----+---------+-------+ Brachial 108                    triphasic        +---------+------------------+-----+---------+-------+ PTA      85                0.79 biphasic         +---------+------------------+-----+---------+-------+  DP       108               1.00 triphasic         +---------+------------------+-----+---------+-------+ Great Toe58                0.54 Normal           +---------+------------------+-----+---------+-------+ +-------+-----------+-----------+------------+------------+ ABI/TBIToday's ABIToday's TBIPrevious ABIPrevious TBI +-------+-----------+-----------+------------+------------+ Right  0.73                                           +-------+-----------+-----------+------------+------------+ Left   1.00                                           +-------+-----------+-----------+------------+------------+  Summary: Right: Resting right ankle-brachial index indicates moderate right lower extremity arterial disease. RT great toe pressure = 26 mmHg. Left: Resting left ankle-brachial index is within normal range. No evidence of significant left lower extremity arterial disease. LT Great toe pressure = 58 mmHg.  *See table(s) above for measurements and observations.  Electronically signed by Fabienne Bruns MD on 03/18/2019 at 11:37:23 AM.   Final    ECHOCARDIOGRAM COMPLETE  Result Date: 03/14/2019   ECHOCARDIOGRAM REPORT   Patient Name:   BRADEN CIMO Date of Exam: 03/14/2019 Medical Rec #:  [id number]      Height:       72.0 in Accession #:    [id number]     Weight:       237.9 lb Date of Birth:  Apr 20, 1955       BSA:          2.29 m Patient Age:    63 years       BP:           88/72 mmHg Patient Gender: M              HR:           110 bpm. [facility]am [facility] Procedure: 2D Echo Indications:    Cardiomyopathy-Unspecified 425.9 / I42.9                 Elevated Troponin  History:        Patient has no prior history of Echocardiogram examinations.                 Stroke, Arrythmias:Atrial Fibrillation; Risk Factors:Former                 Smoker, Dyslipidemia and Diabetes. Sepsis.  Sonographer:    Jeryl Columbia RDCS (AE) Referring Phys: [id number] Ellsworth Lennox IMPRESSIONS  1. Left ventricular ejection fraction, by visual estimation, is 20  to 25%. The left ventricle has severely decreased function. There is no left ventricular hypertrophy.  2. Definity contrast agent was given IV to delineate the left ventricular endocardial borders.  3. Elevated left ventricular end-diastolic pressure.  4. Indeterminate diastolic filling due to E-A fusion.  5. The left ventricle demonstrates global hypokinesis.  6. Global right ventricle has severely reduced systolic function.The right ventricular size is mildly enlarged. No increase in right ventricular wall thickness.  7. Left atrial size was normal.  8. Right atrial size was mildly dilated.  9. Mild mitral annular calcification. 10. The mitral valve is grossly normal.  Mild mitral valve regurgitation. 11. The tricuspid valve is normal in structure. 12. The tricuspid valve is normal in structure. Tricuspid valve regurgitation is mild. 13. The aortic valve is tricuspid. Aortic valve regurgitation is not visualized. No evidence of aortic valve sclerosis or stenosis. 14. The pulmonic valve was grossly normal. Pulmonic valve regurgitation is not visualized. 15. Moderately elevated pulmonary artery systolic pressure. 16. The inferior vena cava is dilated in size with >50% respiratory variability, suggesting right atrial pressure of 8 mmHg. FINDINGS  Left Ventricle: Left ventricular ejection fraction, by visual estimation, is 20 to 25%. The left ventricle has severely decreased function. Definity contrast agent was given IV to delineate the left ventricular endocardial borders. The left ventricle demonstrates global hypokinesis. There is no left ventricular hypertrophy. Indeterminate diastolic filling due to E-A fusion. Elevated left ventricular end-diastolic pressure. Right Ventricle: The right ventricular size is mildly enlarged. No increase in right ventricular wall thickness. Global RV systolic function is has severely reduced systolic function. The tricuspid regurgitant velocity is 3.24 m/s, and with an assumed right  atrial pressure of 10 mmHg, the estimated right ventricular systolic pressure is moderately elevated at 52.0 mmHg. Left Atrium: Left atrial size was normal in size. Right Atrium: Right atrial size was mildly dilated Pericardium: There is no evidence of pericardial effusion. Mitral Valve: The mitral valve is grossly normal. Mildly decreased mobility of the mitral valve leaflets. Mild mitral annular calcification. Mild mitral valve regurgitation. Tricuspid Valve: The tricuspid valve is normal in structure. Tricuspid valve regurgitation is mild. Aortic Valve: The aortic valve is tricuspid. Aortic valve regurgitation is not visualized. The aortic valve is structurally normal, with no evidence of sclerosis or stenosis. Pulmonic Valve: The pulmonic valve was grossly normal. Pulmonic valve regurgitation is not visualized. Pulmonic regurgitation is not visualized. Aorta: The aortic root is normal in size and structure. Venous: The inferior vena cava is dilated in size with greater than 50% respiratory variability, suggesting right atrial pressure of 8 mmHg. IAS/Shunts: No atrial level shunt detected by color flow Doppler.  LEFT VENTRICLE PLAX 2D LVIDd:         4.80 cm  Diastology LVIDs:         4.25 cm  LV e' lateral:   6.50 cm/s LV PW:         1.16 cm  LV E/e' lateral: 10.4 LV IVS:        1.06 cm  LV e' medial:    4.31 cm/s LVOT diam:     2.00 cm  LV E/e' medial:  15.8 LV SV:         27 ml LV SV Index:   11.26 LVOT Area:     3.14 cm  RIGHT VENTRICLE RV S prime:     4.62 cm/s TAPSE (M-mode): 0.8 cm LEFT ATRIUM             Index       RIGHT ATRIUM           Index LA diam:        4.90 cm 2.14 cm/m  RA Area:     19.60 cm LA Vol (A2C):   72.7 ml 31.70 ml/m RA Volume:   67.70 ml  29.52 ml/m LA Vol (A4C):   64.7 ml 28.21 ml/m LA Biplane Vol: 70.2 ml 30.61 ml/m   AORTA Ao Root diam: 2.80 cm MITRAL VALVE  TRICUSPID VALVE MV Area (PHT): 4.21 cm             TR Peak grad:   42.0 mmHg MV PHT:        52.20  msec           TR Vmax:        324.00 cm/s MV Decel Time: 180 msec MR Peak grad: 58.1 mmHg             SHUNTS MR Mean grad: 41.0 mmHg             Systemic Diam: 2.00 cm MR Vmax:      381.00 cm/s MR Vmean:     304.0 cm/s MV E velocity: 67.90 cm/s 103 cm/s MV A velocity: 20.90 cm/s 70.3 cm/s MV E/A ratio:  3.25       1.5  Prentice Docker MD Electronically signed by Prentice Docker MD Signature Date/Time: 03/14/2019/11:15:54 AM    Final    CT Angio Chest/Abd/Pel for Dissection W and/or Wo Contrast  Result Date: 03/13/2019 CLINICAL DATA:  Abdominal pain, remote fall EXAM: CT ANGIOGRAPHY CHEST, ABDOMEN AND PELVIS TECHNIQUE: Multidetector CT imaging through the chest, abdomen and pelvis was performed using the standard protocol during bolus administration of intravenous contrast. Multiplanar reconstructed images and MIPs were obtained and reviewed to evaluate the vascular anatomy. CONTRAST:  OMNIPAQUE IOHEXOL 350 MG/ML SOLN COMPARISON:  None. FINDINGS: CTA CHEST FINDINGS Cardiovascular: Preferential opacification of the thoracic aorta. No evidence of thoracic aortic aneurysm or dissection. Normal heart size. No pericardial effusion. Thoracic aortic atherosclerosis. Multi vessel coronary artery atherosclerosis. Mediastinum/Nodes: No enlarged mediastinal, hilar, or axillary lymph nodes. Thyroid gland, trachea, and esophagus demonstrate no significant findings. Lungs/Pleura: Small bilateral pleural effusions, right greater than left. Right lower lobe patchy airspace disease. No pneumothorax. Musculoskeletal: No chest wall abnormality. No acute or significant osseous findings. Review of the MIP images confirms the above findings. CTA ABDOMEN AND PELVIS FINDINGS VASCULAR Aorta: Normal caliber aorta without aneurysm, dissection, vasculitis or significant stenosis. Abdominal aortic atherosclerosis. Celiac: Patent without evidence of aneurysm, dissection, vasculitis or significant stenosis. SMA: Patent without evidence  of aneurysm, dissection, vasculitis or significant stenosis. Renals: Both renal arteries are patent without evidence of aneurysm, dissection, vasculitis, fibromuscular dysplasia or significant stenosis. IMA: Patent without evidence of aneurysm, dissection, vasculitis or significant stenosis. Inflow: Patent without evidence of aneurysm, dissection, vasculitis or significant stenosis. Veins: No obvious venous abnormality within the limitations of this arterial phase study. Review of the MIP images confirms the above findings. NON-VASCULAR Hepatobiliary: No focal liver abnormality is seen. No gallstones, gallbladder wall thickening, or biliary dilatation. Pancreas: Unremarkable. No pancreatic ductal dilatation or surrounding inflammatory changes. Spleen: Normal in size without focal abnormality. Adrenals/Urinary Tract: Adrenal glands are unremarkable. Kidneys are normal, without renal calculi, focal lesion, or hydronephrosis. Bladder is unremarkable. Stomach/Bowel: Stomach is within normal limits. Appendix appears normal. No evidence of bowel wall thickening, distention, or inflammatory changes. Lymphatic: No lymphadenopathy. Reproductive: Prostate is unremarkable. Other: Small amount of abdominal and pelvic free fluid. Mild anasarca. Musculoskeletal: No acute osseous abnormality. No aggressive osseous lesion. Review of the MIP images confirms the above findings. IMPRESSION: 1. No evidence of an aortic dissection.  No aortic aneurysm. 2.  Aortic Atherosclerosis (ICD10-I70.0). 3. Right lower lobe pneumonia. 4. Small amount of pelvic free fluid. Electronically Signed   By: Elige Ko   On: 03/13/2019 11:55    Lab Data:  CBC: Recent Labs  Lab 03/13/19 1056 03/14/19 8119 03/16/19 1478 03/17/19  0500 03/18/19 0509  WBC 16.3* 13.8* 13.7* 12.3* 10.6*  NEUTROABS 13.0*  --   --   --   --   HGB 14.8 14.9 14.7 13.7 13.7  HCT 45.7 47.2 44.8 40.8 41.5  MCV 95.8 96.1 93.7 91.7 92.6  PLT 300 271 160 182 158    Basic Metabolic Panel: Recent Labs  Lab 03/15/19 0243 03/15/19 0415 03/16/19 0419 03/16/19 1500 03/17/19 0500 03/18/19 0509  NA 130*  --  137 134* 133* 129*  K 4.4  --  2.8* 3.7 3.6 3.5  CL 101  --  100 98 97* 93*  CO2 16*  --  GLUCOSE 137*  --  99 234* 150* 155*  BUN 35*  --  26* CREATININE 1.17  --  0.79 0.90 0.68 0.65  CALCIUM 8.5*  --  8.3* 7.6* 7.9* 7.8*  MG  --  2.1  --   --   --  1.7   GFR: Estimated Creatinine Clearance: 119.8 mL/min (by C-G formula based on SCr of 0.65 mg/dL). Liver Function Tests: Recent Labs  Lab 03/14/19 0605 03/14/19 1233 03/16/19 0419 03/17/19 0500 03/18/19 0509  AST 1,500* 1,488* 820* 533* 480*  ALT 1,187* 1,311* 1,313* 1,080* 957*  ALKPHOS 297* 301* 216* 248* 231*  BILITOT 3.1* 3.2* 2.6* 2.3* 2.6*  PROT 6.2* 6.1* 4.8* 5.0* 5.4*  ALBUMIN 3.3* 3.3* 2.4* 2.3* 2.5*   No results for input(s): LIPASE, AMYLASE in the last 168 hours. No results for input(s): AMMONIA in the last 168 hours. Coagulation Profile: Recent Labs  Lab 03/13/19 1056 03/14/19 0605 03/14/19 1233 03/16/19 0419 03/17/19 0500  INR 1.6* 2.2* 2.6* 2.6* 1.9*   Cardiac Enzymes: Recent Labs  Lab 03/13/19 1056  CKTOTAL 79   BNP (last 3 results) No results for input(s): PROBNP in the last 8760 hours. HbA1C: No results for input(s): HGBA1C in the last 72 hours. CBG: Recent Labs  Lab 03/17/19 1626 03/17/19 1958 03/18/19 0008 03/18/19 0403 03/18/19 0743  GLUCAP 241* 218* 189* 134* 114*   Lipid Profile: No results for input(s): CHOL, HDL, LDLCALC, TRIG, CHOLHDL, LDLDIRECT in the last 72 hours. Thyroid Function Tests: No results for input(s): TSH, T4TOTAL, FREET4, T3FREE, THYROIDAB in the last 72 hours. Anemia Panel: No results for input(s): VITAMINB12, FOLATE, FERRITIN, TIBC, IRON, RETICCTPCT in the last 72 hours. Urine analysis:    Component Value Date/Time   COLORURINE AMBER (A) 03/14/2019 0600   APPEARANCEUR HAZY (A) 03/14/2019  0600   LABSPEC 1.032 (H) 03/14/2019 0600   PHURINE 5.0 03/14/2019 0600   GLUCOSEU >=500 (A) 03/14/2019 0600   HGBUR NEGATIVE 03/14/2019 0600   BILIRUBINUR NEGATIVE 03/14/2019 0600   KETONESUR 5 (A) 03/14/2019 0600   PROTEINUR 30 (A) 03/14/2019 0600   NITRITE NEGATIVE 03/14/2019 0600   LEUKOCYTESUR NEGATIVE 03/14/2019 0600     Jackie Plum M.D. [facility] 03/18/2019, 12:10 PM  Pager: [phone] Between 7am to 7pm - call Pager - [phone]  After 7pm go to [email or web address] - password [email or web address]  Call night coverage person covering after 7pm

## 2019-03-19 ENCOUNTER — Encounter (HOSPITAL_COMMUNITY)
Admission: EM | Disposition: A | Discharge status: Skilled Nursing Facility | Payer: Self-pay | Source: Home / Self Care | Attending: Internal Medicine

## 2019-03-19 DIAGNOSIS — I5043 Acute on chronic combined systolic (congestive) and diastolic (congestive) heart failure: Secondary | ICD-10-CM

## 2019-03-19 DIAGNOSIS — R570 Cardiogenic shock: Secondary | ICD-10-CM

## 2019-03-19 DIAGNOSIS — I2510 Atherosclerotic heart disease of native coronary artery without angina pectoris: Secondary | ICD-10-CM

## 2019-03-19 HISTORY — PX: RIGHT/LEFT HEART CATH AND CORONARY ANGIOGRAPHY: CATH118266

## 2019-03-19 LAB — POCT I-STAT EG7
Acid-Base Excess: 9 mmol/L — ABNORMAL HIGH (ref 0.0–2.0)
Bicarbonate: 34.3 mmol/L — ABNORMAL HIGH (ref 20.0–28.0)
Calcium, Ion: 1.12 mmol/L — ABNORMAL LOW (ref 1.15–1.40)
HCT: 46 % (ref 39.0–52.0)
Hemoglobin: 15.6 g/dL (ref 13.0–17.0)
O2 Saturation: 64 %
Potassium: 3.7 mmol/L (ref 3.5–5.1)
Sodium: 131 mmol/L — ABNORMAL LOW (ref 135–145)
TCO2: 36 mmol/L — ABNORMAL HIGH (ref 22–32)
pCO2, Ven: 46.9 mmHg (ref 44.0–60.0)
pH, Ven: 7.473 — ABNORMAL HIGH (ref 7.250–7.430)
pO2, Ven: 32 mmHg (ref 32.0–45.0)

## 2019-03-19 LAB — BASIC METABOLIC PANEL
Anion gap: 13 (ref 5–15)
BUN: 12 mg/dL (ref 8–23)
CO2: 29 mmol/L (ref 22–32)
Calcium: 8.2 mg/dL — ABNORMAL LOW (ref 8.9–10.3)
Chloride: 85 mmol/L — ABNORMAL LOW (ref 98–111)
Creatinine, Ser: 0.68 mg/dL (ref 0.61–1.24)
GFR calc Af Amer: >60 mL/min (ref >60)
GFR calc non Af Amer: >60 mL/min (ref >60)
Glucose, Bld: 154 mg/dL — ABNORMAL HIGH (ref 70–99)
Potassium: 4 mmol/L (ref 3.5–5.1)
Sodium: 127 mmol/L — ABNORMAL LOW (ref 135–145)

## 2019-03-19 LAB — GLUCOSE, CAPILLARY
Glucose-Capillary: 127 mg/dL — ABNORMAL HIGH (ref 70–99)
Glucose-Capillary: 127 mg/dL — ABNORMAL HIGH (ref 70–99)
Glucose-Capillary: 134 mg/dL — ABNORMAL HIGH (ref 70–99)
Glucose-Capillary: 162 mg/dL — ABNORMAL HIGH (ref 70–99)
Glucose-Capillary: 166 mg/dL — ABNORMAL HIGH (ref 70–99)
Glucose-Capillary: 179 mg/dL — ABNORMAL HIGH (ref 70–99)

## 2019-03-19 LAB — CBC
HCT: 40.8 % (ref 39.0–52.0)
Hemoglobin: 13.7 g/dL (ref 13.0–17.0)
MCH: 30.6 pg (ref 26.0–34.0)
MCHC: 33.6 g/dL (ref 30.0–36.0)
MCV: 91.3 fL (ref 80.0–100.0)
Platelets: 172 10*3/uL (ref 150–400)
RBC: 4.47 MIL/uL (ref 4.22–5.81)
RDW: 13.4 % (ref 11.5–15.5)
WBC: 11.7 10*3/uL — ABNORMAL HIGH (ref 4.0–10.5)
nRBC: 0 % (ref 0.0–0.2)

## 2019-03-19 LAB — HEPATIC FUNCTION PANEL
ALT: 722 U/L — ABNORMAL HIGH (ref 0–44)
AST: 400 U/L — ABNORMAL HIGH (ref 15–41)
Albumin: 2.4 g/dL — ABNORMAL LOW (ref 3.5–5.0)
Alkaline Phosphatase: 206 U/L — ABNORMAL HIGH (ref 38–126)
Bilirubin, Direct: 1 mg/dL — ABNORMAL HIGH (ref 0.0–0.2)
Indirect Bilirubin: 2 mg/dL — ABNORMAL HIGH (ref 0.3–0.9)
Total Bilirubin: 3 mg/dL — ABNORMAL HIGH (ref 0.3–1.2)
Total Protein: 5.3 g/dL — ABNORMAL LOW (ref 6.5–8.1)

## 2019-03-19 LAB — COOXEMETRY PANEL
Carboxyhemoglobin: 2.3 % — ABNORMAL HIGH (ref 0.5–1.5)
Methemoglobin: 1 % (ref 0.0–1.5)
O2 Saturation: 84.5 %
Total hemoglobin: 14 g/dL (ref 12.0–16.0)

## 2019-03-19 LAB — MAGNESIUM: Magnesium: 1.8 mg/dL (ref 1.7–2.4)

## 2019-03-19 SURGERY — RIGHT/LEFT HEART CATH AND CORONARY ANGIOGRAPHY
Anesthesia: LOCAL

## 2019-03-19 MED ORDER — SPIRONOLACTONE 25 MG PO TABS
25.0000 mg | ORAL_TABLET | Freq: Every day | ORAL | Status: DC
  Administered 2019-03-19 – 2019-03-30 (×11): 25 mg via ORAL
  Filled 2019-03-19 (×11): qty 1

## 2019-03-19 MED ORDER — ACETAMINOPHEN 325 MG PO TABS
650.0000 mg | ORAL_TABLET | ORAL | Status: DC | PRN
  Administered 2019-03-23 – 2019-03-25 (×3): 650 mg via ORAL
  Filled 2019-03-19 (×3): qty 2

## 2019-03-19 MED ORDER — LIDOCAINE HCL (PF) 1 % IJ SOLN
INTRAMUSCULAR | Status: AC
  Filled 2019-03-19: qty 30

## 2019-03-19 MED ORDER — HEPARIN (PORCINE) IN NACL 1000-0.9 UT/500ML-% IV SOLN
INTRAVENOUS | Status: AC
  Filled 2019-03-19: qty 1000

## 2019-03-19 MED ORDER — ATORVASTATIN CALCIUM 40 MG PO TABS
40.0000 mg | ORAL_TABLET | Freq: Every day | ORAL | Status: DC
  Administered 2019-03-19 – 2019-03-20 (×2): 40 mg via ORAL
  Filled 2019-03-19 (×2): qty 1

## 2019-03-19 MED ORDER — HEPARIN SODIUM (PORCINE) 1000 UNIT/ML IJ SOLN
INTRAMUSCULAR | Status: AC
  Filled 2019-03-19: qty 1

## 2019-03-19 MED ORDER — SODIUM CHLORIDE 0.9 % IV SOLN: 250.0000 mL | INTRAVENOUS | Status: DC | PRN

## 2019-03-19 MED ORDER — ONDANSETRON HCL 4 MG/2ML IJ SOLN: 4.0000 mg | Freq: Four times a day (QID) | INTRAMUSCULAR | Status: DC | PRN

## 2019-03-19 MED ORDER — HYDRALAZINE HCL 20 MG/ML IJ SOLN: 10.0000 mg | INTRAMUSCULAR | Status: AC | PRN

## 2019-03-19 MED ORDER — VERAPAMIL HCL 2.5 MG/ML IV SOLN
INTRAVENOUS | Status: AC
  Filled 2019-03-19: qty 2

## 2019-03-19 MED ORDER — LIDOCAINE HCL (PF) 1 % IJ SOLN
INTRAMUSCULAR | Status: DC | PRN
  Administered 2019-03-19 (×2): 2 mL via INTRADERMAL

## 2019-03-19 MED ORDER — ENOXAPARIN SODIUM 40 MG/0.4ML ~~LOC~~ SOLN: 40.0000 mg | SUBCUTANEOUS | Status: DC

## 2019-03-19 MED ORDER — SODIUM CHLORIDE 0.9% FLUSH
3.0000 mL | INTRAVENOUS | Status: DC | PRN
  Administered 2019-03-21: 3 mL via INTRAVENOUS

## 2019-03-19 MED ORDER — FENTANYL CITRATE (PF) 100 MCG/2ML IJ SOLN
INTRAMUSCULAR | Status: AC
  Filled 2019-03-19: qty 2

## 2019-03-19 MED ORDER — IOHEXOL 350 MG/ML SOLN
INTRAVENOUS | Status: DC | PRN
  Administered 2019-03-19: 13:00:00 60 mL

## 2019-03-19 MED ORDER — HEPARIN (PORCINE) IN NACL 1000-0.9 UT/500ML-% IV SOLN
INTRAVENOUS | Status: DC | PRN
  Administered 2019-03-19 (×2): 500 mL

## 2019-03-19 MED ORDER — FENTANYL CITRATE (PF) 100 MCG/2ML IJ SOLN
INTRAMUSCULAR | Status: DC | PRN
  Administered 2019-03-19: 25 ug via INTRAVENOUS

## 2019-03-19 MED ORDER — VERAPAMIL HCL 2.5 MG/ML IV SOLN
INTRAVENOUS | Status: DC | PRN
  Administered 2019-03-19: 13:00:00 10 mL via INTRA_ARTERIAL

## 2019-03-19 MED ORDER — MIDAZOLAM HCL 2 MG/2ML IJ SOLN
INTRAMUSCULAR | Status: DC | PRN
  Administered 2019-03-19: 1 mg via INTRAVENOUS

## 2019-03-19 MED ORDER — MIDAZOLAM HCL 2 MG/2ML IJ SOLN
INTRAMUSCULAR | Status: AC
  Filled 2019-03-19: qty 2

## 2019-03-19 MED ORDER — LABETALOL HCL 5 MG/ML IV SOLN: 10.0000 mg | INTRAVENOUS | Status: AC | PRN

## 2019-03-19 MED ORDER — HEPARIN SODIUM (PORCINE) 1000 UNIT/ML IJ SOLN
INTRAMUSCULAR | Status: DC | PRN
  Administered 2019-03-19: 5000 [IU] via INTRAVENOUS

## 2019-03-19 MED ORDER — SODIUM CHLORIDE 0.9% FLUSH
3.0000 mL | Freq: Two times a day (BID) | INTRAVENOUS | Status: DC
  Administered 2019-03-20 – 2019-03-21 (×2): 3 mL via INTRAVENOUS

## 2019-03-19 MED ORDER — LOSARTAN POTASSIUM 25 MG PO TABS
12.5000 mg | ORAL_TABLET | Freq: Every day | ORAL | Status: DC
  Administered 2019-03-19: 12.5 mg via ORAL
  Filled 2019-03-19: qty 1

## 2019-03-19 SURGICAL SUPPLY — 11 items
CATH 5FR JL3.5 JR4 ANG PIG MP (CATHETERS) ×1 IMPLANT
CATH BALLN WEDGE 5F 110CM (CATHETERS) ×1 IMPLANT
DEVICE RAD COMP TR BAND LRG (VASCULAR PRODUCTS) ×1 IMPLANT
GLIDESHEATH SLEND SS 6F .021 (SHEATH) ×1 IMPLANT
GUIDEWIRE INQWIRE 1.5J.035X260 (WIRE) IMPLANT
HOVERMATT SINGLE USE (MISCELLANEOUS) ×1 IMPLANT
INQWIRE 1.5J .035X260CM (WIRE) ×2
KIT HEART LEFT (KITS) ×2 IMPLANT
PACK CARDIAC CATHETERIZATION (CUSTOM PROCEDURE TRAY) ×2 IMPLANT
SHEATH GLIDE SLENDER 4/5FR (SHEATH) ×1 IMPLANT
TRANSDUCER W/STOPCOCK (MISCELLANEOUS) ×2 IMPLANT

## 2019-03-19 NOTE — Care Management Important Message (Signed)
Important Message  Patient Details  Name: Matthew Mccormick MRN: [id number] Date of Birth: [birth year]   Medicare Important Message Given:  Yes     Renie Ora 03/19/2019, 4:05 PM

## 2019-03-19 NOTE — Interval H&P Note (Signed)
History and Physical Interval Note:  03/19/2019 12:32 PM  Matthew Mccormick  has presented today for surgery, with the diagnosis of heart failure.  The various methods of treatment have been discussed with the patient and family. After consideration of risks, benefits and other options for treatment, the patient has consented to  Procedure(s): RIGHT/LEFT HEART CATH AND CORONARY ANGIOGRAPHY (N/A) as a surgical intervention.  The patient's history has been reviewed, patient examined, no change in status, stable for surgery.  I have reviewed the patient's chart and labs.  Questions were answered to the patient's satisfaction.     Jemimah Cressy [employer]

## 2019-03-19 NOTE — Progress Notes (Signed)
Attempted to get consent for R & L heart cath signed, Pt states he does not want to sign it until in the morning. Pt states he wants to talk to MD. Pt continues to be oriented x4, no complaints voiced and VS stable. Will continue to monitor. Dierdre Highman, RN

## 2019-03-19 NOTE — Progress Notes (Addendum)
PROGRESS NOTE    Matthew Mccormick  VOZ:[id number] DOB: 1955-12-23 DOA: 03/13/2019 PCP: Kirstie Peri, MD   Brief Narrative: Patient is a 64 year old male with history of atrial fibrillation, diabetes type 2, hypertension, thombotic stroke who presented at [facility] hospital initially with several days of weakness, malaise, nausea, vomiting.  He was hypotensive on presentation.  He was suspected to have  cardiogenic shock and was transferred to [facility] for further management  Assessment & Plan:   Active Problems:   Sepsis (HCC)   Pressure injury of skin   Ischemic hepatitis   Cardiogenic shock: Currently on milrinone, Digoxin.  Cardiology team following.  On Lasix,spironolactone. Prior echo in 2018 showed ejection fraction of 30 to 35%, current echo showing EF of 10 to 15% with severe right ventricular dysfunction.  Cause of cardiomyopathy is uncertain at this point.   Plan for right and left heart catheterization today. Initially started on norepinephrine, now off.  Acute metabolic encephalopathy: Currently alert and oriented.  Patient lives by himself  in [place].  Acute hypoxemic respiratory failure: Present on admission.  Suspected to be from acute pulmonary edema from heart failure.  Chest x-ray did not show pneumonia.  CT chest was concerning for right lower lobe pneumonia but he is afebrile without leukocytosis.  Not on antibiotics.  Continue diuresis  Hypertension: Currently blood pressure stable.  Antihypertensive regimen on hold  Diabetes mellitus 2: Continue sliding-scale insulin.  On insulin at home  History of CVA: Continue Plavix.  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  Not on statin due to elevated liver enzymes.  Elevated liver enzymes: Secondary to combination of hepatic condition, shock liver.  Liver enzymes trending down.  Suspected peripheral artery disease: Has a bilateral small ulcerations on bilateral egs.Present on  admission.ABI showed moderate right lower  extremity arterial disease .  Will recommend follow-up with vascular surgery as an outpatient.Conitnue plavix.  Will resume statin when liver enzymes further improve.  Hyponatremia: Secondary to hypervolemic hyponatremia from CHF.  Continue to monitor.  Sodium is 127 today.  Pressure Injury 03/13/19 Sacrum Stage 1 -  Intact skin with non-blanchable redness of a localized area usually over a bony prominence. (Active)  03/13/19 1700  Location: Sacrum  Location Orientation:   Staging: Stage 1 -  Intact skin with non-blanchable redness of a localized area usually over a bony prominence.  Wound Description (Comments):   Present on Admission: Yes              DVT prophylaxis:Lovenox Code Status: Full Family Communication: None present at the bedside Disposition Plan: Patient is from home.  PT/oT recommending SnF. Waiting for cardiac cath  Consultants: Cardiology  Procedures:None  Antimicrobials:  Anti-infectives (From admission, onward)   Start     Dose/Rate Route Frequency Ordered Stop   03/14/19 1200  cefTRIAXone (ROCEPHIN) 2 g in sodium chloride 0.9 % 100 mL IVPB  Status:  Discontinued     2 g 200 mL/hr over 30 Minutes Intravenous Every 24 hours 03/14/19 0951 03/15/19 0954   03/14/19 1100  azithromycin (ZITHROMAX) 500 mg in sodium chloride 0.9 % 250 mL IVPB  Status:  Discontinued     500 mg 250 mL/hr over 60 Minutes Intravenous Every 24 hours 03/14/19 0951 03/15/19 0954   03/14/19 0000  vancomycin (VANCOREADY) IVPB 1250 mg/250 mL  Status:  Discontinued     1,250 mg 166.7 mL/hr over 90 Minutes Intravenous Every 12 hours 03/13/19 1158 03/14/19 0951   03/13/19  2000  ceFEPIme (MAXIPIME) 2 g in sodium chloride 0.9 % 100 mL IVPB  Status:  Discontinued     2 g 200 mL/hr over 30 Minutes Intravenous Every 8 hours 03/13/19 1058 03/14/19 0951   03/13/19 1030  vancomycin (VANCOCIN) IVPB 1000 mg/200 mL premix  Status:  Discontinued     1,000  mg 200 mL/hr over 60 Minutes Intravenous Every 1 hr x 2 03/13/19 1001 03/13/19 1002   03/13/19 1030  vancomycin (VANCOREADY) IVPB 2000 mg/400 mL     2,000 mg 200 mL/hr over 120 Minutes Intravenous  Once 03/13/19 1002 03/13/19 1343   03/13/19 1000  ceFEPIme (MAXIPIME) 2 g in sodium chloride 0.9 % 100 mL IVPB     2 g 200 mL/hr over 30 Minutes Intravenous  Once 03/13/19 0952 03/13/19 1103   03/13/19 1000  metroNIDAZOLE (FLAGYL) IVPB 500 mg     500 mg 100 mL/hr over 60 Minutes Intravenous  Once 03/13/19 0952 03/13/19 1141   03/13/19 1000  vancomycin (VANCOCIN) IVPB 1000 mg/200 mL premix  Status:  Discontinued     1,000 mg 200 mL/hr over 60 Minutes Intravenous  Once 03/13/19 0998 03/13/19 1001      Subjective:  Patient seen and examined the bedside this morning.  Hemodynamically stable.  Denies any chest pain or shortness of breath.  Waiting for cardiac catheterization today.  Objective: Vitals:   03/19/19 0103 03/19/19 0405 03/19/19 0614 03/19/19 0700  BP:  104/71  109/70  Pulse:  (!) 115  (!) 113  Resp: 14 14  16   Temp:  98.9 F (37.2 C)  98 F (36.7 C)  TempSrc:  Oral  Oral  SpO2:  97%  98%  Weight:   101.6 kg   Height:        Intake/Output Summary (Last 24 hours) at 03/19/2019 0842 Last data filed at 03/19/2019 0216 Gross per 24 hour  Intake 1938.85 ml  Output 7020 ml  Net -5081.15 ml   Filed Weights   03/17/19 0333 03/18/19 0411 03/19/19 0614  Weight: 107.3 kg 107.6 kg 101.6 kg    Examination:  General exam: Generalized weakness, chronically ill looking HEENT:PERRL,Oral mucosa moist, Ear/Nose normal on gross exam Respiratory system: Bilateral equal air entry, normal vesicular breath sounds, no wheezes or crackles  Cardiovascular system: Sinus tachycardia. No JVD, murmurs, rubs, gallops or clicks. No pedal edema.  Central line on the left chest Gastrointestinal system: Abdomen is nondistended, soft and nontender. No organomegaly or masses felt. Normal bowel sounds  heard. Central nervous system: Alert and oriented. No focal neurological deficits. Extremities: Trace bilateral lower extremity edema, no clubbing ,no cyanosis Skin: No rashes, lesions ,no icterus ,no pallor,  ulcers on bilateral lower extremities MSK: Normal muscle bulk,tone ,power Psychiatry: Judgement and insight appear normal. Mood & affect appropriate.     Data Reviewed: I have personally reviewed following labs and imaging studies  CBC: Recent Labs  Lab 03/13/19 1056 03/13/19 1056 03/14/19 0605 03/16/19 0854 03/17/19 0500 03/18/19 0509 03/19/19 0359  WBC 16.3*   < > 13.8* 13.7* 12.3* 10.6* 11.7*  NEUTROABS 13.0*  --   --   --   --   --   --   HGB 14.8   < > 14.9 14.7 13.7 13.7 13.7  HCT 45.7   < > 47.2 44.8 40.8 41.5 40.8  MCV 95.8   < > 96.1 93.7 91.7 92.6 91.3  PLT 300   < > 271 160 182 158 172   < > =  values in this interval not displayed.   Basic Metabolic Panel: Recent Labs  Lab 03/15/19 0243 03/15/19 0415 03/16/19 0419 03/16/19 1500 03/17/19 0500 03/18/19 0509 03/19/19 0359  NA   < >  --  137 134* 133* 129* 127*  K   < >  --  2.8* 3.7 3.6 3.5 4.0  CL   < >  --  100 98 97* 93* 85*  CO2   < >  --  GLUCOSE   < >  --  99 234* 150* 155* 154*  BUN   < >  --  26* CREATININE   < >  --  0.79 0.90 0.68 0.65 0.68  CALCIUM   < >  --  8.3* 7.6* 7.9* 7.8* 8.2*  MG  --  2.1  --   --   --  1.7 1.8   < > = values in this interval not displayed.   GFR: Estimated Creatinine Clearance: 116.6 mL/min (by C-G formula based on SCr of 0.68 mg/dL). Liver Function Tests: Recent Labs  Lab 03/14/19 1233 03/16/19 0419 03/17/19 0500 03/18/19 0509 03/19/19 0359  AST 1,488* 820* 533* 480* 400*  ALT 1,311* 1,313* 1,080* 957* 722*  ALKPHOS 301* 216* 248* 231* 206*  BILITOT 3.2* 2.6* 2.3* 2.6* 3.0*  PROT 6.1* 4.8* 5.0* 5.4* 5.3*  ALBUMIN 3.3* 2.4* 2.3* 2.5* 2.4*   No results for input(s): LIPASE, AMYLASE in the last 168 hours. No results for  input(s): AMMONIA in the last 168 hours. Coagulation Profile: Recent Labs  Lab 03/13/19 1056 03/14/19 0605 03/14/19 1233 03/16/19 0419 03/17/19 0500  INR 1.6* 2.2* 2.6* 2.6* 1.9*   Cardiac Enzymes: Recent Labs  Lab 03/13/19 1056  CKTOTAL 79   BNP (last 3 results) No results for input(s): PROBNP in the last 8760 hours. HbA1C: No results for input(s): HGBA1C in the last 72 hours. CBG: Recent Labs  Lab 03/18/19 1632 03/18/19 2001 03/19/19 0003 03/19/19 0417 03/19/19 0731  GLUCAP 184* 200* 166* 162* 127*   Lipid Profile: No results for input(s): CHOL, HDL, LDLCALC, TRIG, CHOLHDL, LDLDIRECT in the last 72 hours. Thyroid Function Tests: No results for input(s): TSH, T4TOTAL, FREET4, T3FREE, THYROIDAB in the last 72 hours. Anemia Panel: No results for input(s): VITAMINB12, FOLATE, FERRITIN, TIBC, IRON, RETICCTPCT in the last 72 hours. Sepsis Labs: Recent Labs  Lab 03/14/19 1546 03/15/19 0203 03/16/19 0756 03/16/19 1156  LATICACIDVEN 4.4* 3.2* 1.4 2.1*    Recent Results (from the past 240 hour(s))  Blood Culture (routine x 2)     Status: None   Collection Time: 03/13/19 10:15 AM   Specimen: BLOOD RIGHT FOREARM  Result Value Ref Range Status   Specimen Description BLOOD RIGHT FOREARM DRAWN BY RN BP  Final   Special Requests   Final    Blood Culture results may not be optimal due to an inadequate volume of blood received in culture bottles   Culture   Final    NO GROWTH 5 DAYS Performed at [facility], [address] [place], [place] [address]    Report Status 03/18/2019 FINAL  Final  Blood Culture (routine x 2)     Status: None   Collection Time: 03/13/19 10:20 AM   Specimen: BLOOD LEFT FOREARM  Result Value Ref Range Status   Specimen Description BLOOD LEFT FOREARM DRAWN BY RN BP  Final   Special Requests   Final    BOTTLES DRAWN AEROBIC AND  ANAEROBIC Blood Culture results may not be optimal due to an inadequate volume of blood received in culture bottles    Culture   Final    NO GROWTH 5 DAYS Performed at [facility], [address] [place], [place] [address]    Report Status 03/18/2019 FINAL  Final  Respiratory Panel by RT PCR (Flu A&B, Covid) - Nasopharyngeal Swab     Status: None   Collection Time: 03/13/19 10:58 AM   Specimen: Nasopharyngeal Swab  Result Value Ref Range Status   SARS Coronavirus 2 by RT PCR NEGATIVE NEGATIVE Final    Comment: (NOTE) SARS-CoV-2 target nucleic acids are NOT DETECTED. The SARS-CoV-2 RNA is generally detectable in upper respiratoy specimens during the acute phase of infection. The lowest concentration of SARS-CoV-2 viral copies this assay can detect is 131 copies/mL. A negative result does not preclude SARS-Cov-2 infection and should not be used as the sole basis for treatment or other patient management decisions. A negative result may occur with  improper specimen collection/handling, submission of specimen other than nasopharyngeal swab, presence of viral mutation(s) within the areas targeted by this assay, and inadequate number of viral copies (<131 copies/mL). A negative result must be combined with clinical observations, patient history, and epidemiological information. The expected result is Negative. Fact Sheet for Patients:  https://www.moore.com/ Fact Sheet for Healthcare Providers:  https://www.young.biz/ This test is not yet ap proved or cleared by the [place] FDA and  has been authorized for detection and/or diagnosis of SARS-CoV-2 by FDA under an Emergency Use Authorization (EUA). This EUA will remain  in effect (meaning this test can be used) for the duration of the COVID-19 declaration under Section 564(b)(1) of the Act, 21 U.S.C. section 360bbb-3(b)(1), unless the authorization is terminated or revoked sooner.    Influenza A by PCR NEGATIVE NEGATIVE Final   Influenza B by PCR NEGATIVE NEGATIVE Final    Comment: (NOTE) The Xpert Xpress  SARS-CoV-2/FLU/RSV assay is intended as an aid in  the diagnosis of influenza from Nasopharyngeal swab specimens and  should not be used as a sole basis for treatment. Nasal washings and  aspirates are unacceptable for Xpert Xpress SARS-CoV-2/FLU/RSV  testing. Fact Sheet for Patients: https://www.moore.com/ Fact Sheet for Healthcare Providers: https://www.young.biz/ This test is not yet approved or cleared by the [place] FDA and  has been authorized for detection and/or diagnosis of SARS-CoV-2 by  FDA under an Emergency Use Authorization (EUA). This EUA will remain  in effect (meaning this test can be used) for the duration of the  Covid-19 declaration under Section 564(b)(1) of the Act, 21  U.S.C. section 360bbb-3(b)(1), unless the authorization is  terminated or revoked. Performed at [facility], [address], [place], [place] [address]   MRSA PCR Screening     Status: None   Collection Time: 03/13/19  4:32 PM   Specimen: Nasal Mucosa; Nasopharyngeal  Result Value Ref Range Status   MRSA by PCR NEGATIVE NEGATIVE Final    Comment:        The GeneXpert MRSA Assay (FDA approved for NASAL specimens only), is one component of a comprehensive MRSA colonization surveillance program. It is not intended to diagnose MRSA infection nor to guide or monitor treatment for MRSA infections. Performed at [facility], [address], [place], [place] [address]   Urine culture     Status: None   Collection Time: 03/14/19  6:00 AM   Specimen: In/Out Cath Urine  Result Value Ref Range Status  Specimen Description   Final    IN/OUT CATH URINE Performed at [facility], [address] [place], [place] [address]    Special Requests   Final    NONE Performed at [facility], [address] [place], [place] [address]    Culture   Final    NO GROWTH Performed at [facility], [address], [place], [place] [address]    Report Status  03/15/2019 FINAL  Final         Radiology Studies: VAS [place] ABI WITH/WO TBI  Result Date: 03/18/2019 LOWER EXTREMITY DOPPLER STUDY High Risk         Hypertension, hyperlipidemia, Diabetes, past history of Factors:          smoking.  Comparison Study: 06/24/2016 Performing Technologist: Levin Bacon RDMS, RVT  Examination Guidelines: A complete evaluation includes at minimum, Doppler waveform signals and systolic blood pressure reading at the level of bilateral brachial, anterior tibial, and posterior tibial arteries, when vessel segments are accessible. Bilateral testing is considered an integral part of a complete examination. Photoelectric Plethysmograph (PPG) waveforms and toe systolic pressure readings are included as required and additional duplex testing as needed. Limited examinations for reoccurring indications may be performed as noted.  ABI Findings: +---------+------------------+-----+---------+--------+ Right    Rt Pressure (mmHg)IndexWaveform Comment  +---------+------------------+-----+---------+--------+ Brachial 101                    triphasic         +---------+------------------+-----+---------+--------+ PTA      51                0.47 audible           +---------+------------------+-----+---------+--------+ DP       79                0.73 audible           +---------+------------------+-----+---------+--------+ Great Toe26                0.24                   +---------+------------------+-----+---------+--------+ +---------+------------------+-----+---------+-------+ Left     Lt Pressure (mmHg)IndexWaveform Comment +---------+------------------+-----+---------+-------+ Brachial 108                    triphasic        +---------+------------------+-----+---------+-------+ PTA      85                0.79 biphasic         +---------+------------------+-----+---------+-------+ DP       108               1.00 triphasic         +---------+------------------+-----+---------+-------+ Great Toe58                0.54 Normal           +---------+------------------+-----+---------+-------+ +-------+-----------+-----------+------------+------------+ ABI/TBIToday's ABIToday's TBIPrevious ABIPrevious TBI +-------+-----------+-----------+------------+------------+ Right  0.73                                           +-------+-----------+-----------+------------+------------+ Left   1.00                                           +-------+-----------+-----------+------------+------------+  Summary: Right: Resting right ankle-brachial index indicates moderate right lower extremity arterial disease. RT great toe pressure = 26 mmHg. Left: Resting left ankle-brachial index is within normal range. No evidence of significant left lower extremity arterial disease. LT Great toe pressure = 58 mmHg.  *See table(s) above for measurements and observations.  Electronically signed by Ruta Hinds MD on 03/18/2019 at 11:37:23 AM.   Final         Scheduled Meds: . atorvastatin  40 mg Oral q1800  . Chlorhexidine Gluconate Cloth  6 each Topical Daily  . clopidogrel  75 mg Oral Daily  . digoxin  0.125 mg Oral Daily  . enoxaparin (LOVENOX) injection  55 mg Subcutaneous Q24H  . furosemide  80 mg Intravenous BID  . insulin aspart  0-15 Units Subcutaneous Q4H  . losartan  12.5 mg Oral Daily  . mouth rinse  15 mL Mouth Rinse BID  . potassium chloride  40 mEq Oral BID  . sodium chloride flush  3 mL Intravenous Q12H  . spironolactone  25 mg Oral Daily   Continuous Infusions: . sodium chloride    . sodium chloride    . sodium chloride    . milrinone 0.25 mcg/kg/min (03/19/19 0352)     LOS: 6 days    Time spent: 35 mins. More than 50% of that time was spent in counseling and/or coordination of care.      Shelly Coss, MD [facility] P2/09/2019, 8:42 AM

## 2019-03-19 NOTE — H&P (View-Only) (Signed)
Patient ID: Matthew Mccormick, male   DOB: 22-Apr-1955, 64 y.o.   MRN: [id number]     Advanced Heart Failure Rounding Note  PCP-Cardiologist: Prentice Docker, MD   Subjective:    Dobutamine increased from 3->5 on 2/5. Developed HRs > 120 yesterday and dobutamine turned down to 4. HR still in 120s. Transitioned to milrinone 0.25 yesterday, HR 110s today.  Co-ox 84% with CVP 10.  Good diuresis yesterday.   Denies CP or SOB. Feels ok.   Objective:   Weight Range: 101.6 kg Body mass index is 30.38 kg/m.   Vital Signs:   Temp:  [98 F (36.7 C)-99.3 F (37.4 C)] 98 F (36.7 C) (02/08 0700) Pulse Rate:  [113-116] 113 (02/08 0700) Resp:  [14-20] 16 (02/08 0700) BP: (101-109)/(67-80) 109/70 (02/08 0700) SpO2:  [96 %-98 %] 98 % (02/08 0700) Weight:  [101.6 kg] 101.6 kg (02/08 0614)    Weight change: Filed Weights   03/17/19 0333 03/18/19 0411 03/19/19 0614  Weight: 107.3 kg 107.6 kg 101.6 kg    Intake/Output:   Intake/Output Summary (Last 24 hours) at 03/19/2019 0831 Last data filed at 03/19/2019 0216 Gross per 24 hour  Intake 1938.85 ml  Output 7020 ml  Net -5081.15 ml      Physical Exam    General: NAD Neck: JVP 8-9 cm, no thyromegaly or thyroid nodule.  Lungs: Clear to auscultation bilaterally with normal respiratory effort. CV: Lateral PMI.  Heart mildly tachy, regular S1/S2, no S3/S4, no murmur.  1+ ankle edema.   Abdomen: Soft, nontender, no hepatosplenomegaly, no distention.  Skin: Intact without lesions or rashes.  Neurologic: Alert and oriented x 3.  Psych: Normal affect. Extremities: No clubbing or cyanosis.  HEENT: Normal.    Telemetry   NSR 110s (personally reviewed)  Labs    CBC Recent Labs    03/18/19 0509 03/19/19 0359  WBC 10.6* 11.7*  HGB 13.7 13.7  HCT 41.5 40.8  MCV 92.6 91.3  PLT 158 172   Basic Metabolic Panel Recent Labs    70/35/00 0509 03/19/19 0359  NA 129* 127*  K 3.5 4.0  CL 93* 85*  CO2 29 29  GLUCOSE 155* 154*  BUN 14  12  CREATININE 0.65 0.68  CALCIUM 7.8* 8.2*  MG 1.7 1.8   Liver Function Tests Recent Labs    03/18/19 0509 03/19/19 0359  AST 480* 400*  ALT 957* 722*  ALKPHOS 231* 206*  BILITOT 2.6* 3.0*  PROT 5.4* 5.3*  ALBUMIN 2.5* 2.4*   No results for input(s): LIPASE, AMYLASE in the last 72 hours. Cardiac Enzymes No results for input(s): CKTOTAL, CKMB, CKMBINDEX, TROPONINI in the last 72 hours.  BNP: BNP (last 3 results) Recent Labs    03/14/19 1832  BNP 1,338.6*    ProBNP (last 3 results) No results for input(s): PROBNP in the last 8760 hours.   D-Dimer No results for input(s): DDIMER in the last 72 hours. Hemoglobin A1C No results for input(s): HGBA1C in the last 72 hours. Fasting Lipid Panel No results for input(s): CHOL, HDL, LDLCALC, TRIG, CHOLHDL, LDLDIRECT in the last 72 hours. Thyroid Function Tests No results for input(s): TSH, T4TOTAL, T3FREE, THYROIDAB in the last 72 hours.  Invalid input(s): FREET3  Other results:   Imaging    No results found.   Medications:     Scheduled Medications: . Chlorhexidine Gluconate Cloth  6 each Topical Daily  . clopidogrel  75 mg Oral Daily  . digoxin  0.125 mg Oral Daily  .  enoxaparin (LOVENOX) injection  55 mg Subcutaneous Q24H  . furosemide  80 mg Intravenous BID  . insulin aspart  0-15 Units Subcutaneous Q4H  . losartan  12.5 mg Oral Daily  . mouth rinse  15 mL Mouth Rinse BID  . potassium chloride  40 mEq Oral BID  . sodium chloride flush  3 mL Intravenous Q12H  . spironolactone  25 mg Oral Daily    Infusions: . sodium chloride    . sodium chloride    . sodium chloride    . milrinone 0.25 mcg/kg/min (03/19/19 0352)    PRN Medications: sodium chloride, sodium chloride, ondansetron **OR** ondansetron (ZOFRAN) IV, Resource ThickenUp Clear, sodium chloride flush    Assessment/Plan   1. Shock: Suspect primarily cardiogenic shock.  Prior echo in 2018 with EF 30-35%, now echo with EF down to 10-15%  with severe RV dysfunction. Cause of cardiomyopathy uncertain though ECG is suggestive of prior MI with anterior Qs.  No ACS this admission (HS-TnI in the 100s range with no trend). He is now off norepinephrine with improved co-ox (84%) on milrinone 0.25. Volume status improved, good diuresis yesterday.  CVP 10.  - Decrease milrinone to 0.125 mcg/kg/min.  - Continue Lasix 80 mg IV bid for 1 more day.  - Continue digoxin 0.125 daily.   - Increase spironolactone to 25 mg daily.  - Add losartan 12.5 mg daily.  - RHC/LHC today, discussed risks/benefits with patient and he agrees to procedure.  2. ID: Initial concern for RLL PNA on CT chest with septic shock. However, afebrile and WBCs only mildly elevated. Cultures remains negative. Afebrile  - He is currently off abx (per CCM).  3. Elevated LFTs: Suspect due to shock liver with hypotension.  LFTs now trending down.   4. H/o CVA: Residual left-sided weakness as well as some cognitive dysfunction per notes.  However, he lives on his own.  - He continues on Plavix.  - Can start statin as LFTs trend down => will start today.  5. Hyponatremia: Hypervolemic hyponatremia, fluid restrict.   6. PAD: Feet with possible ischemic changes.  Moderate RLE vascular disease by ABIs, left normal.   7. Hypomagnesemia/hypokalemia - supp  Length of Stay: 6  Loralie Champagne, MD  03/19/2019, 8:31 AM  Advanced Heart Failure Team Pager [phone] (M-F; 7a - 4p)  Please contact [facility] for night-coverage after hours (4p -7a ) and weekends on [email or web address]

## 2019-03-19 NOTE — TOC Initial Note (Signed)
Transition of Care [facility]) - Initial/Assessment Note    Patient Details  Name: Matthew Mccormick MRN: [id number] Date of Birth: [birth year]  Transition of Care [facility]) CM/SW Contact:    Nonda Lou, LCSW Phone Number: 03/19/2019, 10:13 AM  Clinical Narrative:                 CSW received consult for possible SNF placement at time of discharge. CSW spoke with patient regarding PT recommendation of SNF placement at time of discharge. Patient reported he would like to go home once he is medically ready to be discharged. Patient reported he has someone that comes into his home 6 days a week from [facility]. CSW asked patient was equipment he currently has at home, he stated a walker, cane & shower chair.  CSW agreed to follow-up with patient. No further questions reported at this time. CSW to continue to follow and assist with discharge planning needs.         Patient Goals and CMS Choice        Expected Discharge Plan and Services                                                Prior Living Arrangements/Services                       Activities of Daily Living Home Assistive Devices/Equipment: None ADL Screening (condition at time of admission) Patient's cognitive ability adequate to safely complete daily activities?: Yes Is the patient deaf or have difficulty hearing?: No Does the patient have difficulty seeing, even when wearing glasses/contacts?: No Does the patient have difficulty concentrating, remembering, or making decisions?: No Patient able to express need for assistance with ADLs?: Yes Does the patient have difficulty dressing or bathing?: No Independently performs ADLs?: No Communication: Independent Dressing (OT): Independent Grooming: Independent Feeding: Independent Bathing: Independent Toileting: Needs assistance Is this a change from baseline?: Pre-admission baseline In/Out Bed: Needs assistance Is this a change from  baseline?: Pre-admission baseline Walks in Home: Needs assistance Is this a change from baseline?: Pre-admission baseline Does the patient have difficulty walking or climbing stairs?: No Weakness of Legs: Both Weakness of Arms/Hands: None  Permission Sought/Granted                  Emotional Assessment              Admission diagnosis:  Sepsis (HCC) [A41.9] Pneumonia of right lung due to infectious organism, unspecified part of lung [J18.9] Sepsis, due to unspecified organism, unspecified whether acute organ dysfunction present [facility]) [A41.9] Patient Active Problem List   Diagnosis Date Noted  . Pressure injury of skin 03/14/2019  . Ischemic hepatitis   . Sepsis (HCC) 03/13/2019  . Elevated LFTs   . Elevated INR   . Diabetes mellitus (HCC) 07/26/2016  . Hypertension 07/26/2016  . Coronary artery disease 07/26/2016  . History of CVA (cerebrovascular accident) 07/26/2016  . Atrial fibrillation (HCC) 07/26/2016  . Dyslipidemia 07/26/2016  . Former cigarette smoker 07/26/2016  . Osteomyelitis of toe of left foot (HCC) 07/26/2016   PCP:  Kirstie Peri, MD Pharmacy:   [facility] [phone] - [place], [place] - [address] [place] [address] Phone: [phone] Fax: [phone]  Social Determinants of Health (SDOH) Interventions    Readmission Risk Interventions No flowsheet data found.

## 2019-03-19 NOTE — Progress Notes (Signed)
Patient ID: Matthew Mccormick, male   DOB: 22-Apr-1955, 64 y.o.   MRN: [id number]     Advanced Heart Failure Rounding Note  PCP-Cardiologist: Prentice Docker, MD   Subjective:    Dobutamine increased from 3->5 on 2/5. Developed HRs > 120 yesterday and dobutamine turned down to 4. HR still in 120s. Transitioned to milrinone 0.25 yesterday, HR 110s today.  Co-ox 84% with CVP 10.  Good diuresis yesterday.   Denies CP or SOB. Feels ok.   Objective:   Weight Range: 101.6 kg Body mass index is 30.38 kg/m.   Vital Signs:   Temp:  [98 F (36.7 C)-99.3 F (37.4 C)] 98 F (36.7 C) (02/08 0700) Pulse Rate:  [113-116] 113 (02/08 0700) Resp:  [14-20] 16 (02/08 0700) BP: (101-109)/(67-80) 109/70 (02/08 0700) SpO2:  [96 %-98 %] 98 % (02/08 0700) Weight:  [101.6 kg] 101.6 kg (02/08 0614)    Weight change: Filed Weights   03/17/19 0333 03/18/19 0411 03/19/19 0614  Weight: 107.3 kg 107.6 kg 101.6 kg    Intake/Output:   Intake/Output Summary (Last 24 hours) at 03/19/2019 0831 Last data filed at 03/19/2019 0216 Gross per 24 hour  Intake 1938.85 ml  Output 7020 ml  Net -5081.15 ml      Physical Exam    General: NAD Neck: JVP 8-9 cm, no thyromegaly or thyroid nodule.  Lungs: Clear to auscultation bilaterally with normal respiratory effort. CV: Lateral PMI.  Heart mildly tachy, regular S1/S2, no S3/S4, no murmur.  1+ ankle edema.   Abdomen: Soft, nontender, no hepatosplenomegaly, no distention.  Skin: Intact without lesions or rashes.  Neurologic: Alert and oriented x 3.  Psych: Normal affect. Extremities: No clubbing or cyanosis.  HEENT: Normal.    Telemetry   NSR 110s (personally reviewed)  Labs    CBC Recent Labs    03/18/19 0509 03/19/19 0359  WBC 10.6* 11.7*  HGB 13.7 13.7  HCT 41.5 40.8  MCV 92.6 91.3  PLT 158 172   Basic Metabolic Panel Recent Labs    70/35/00 0509 03/19/19 0359  NA 129* 127*  K 3.5 4.0  CL 93* 85*  CO2 29 29  GLUCOSE 155* 154*  BUN 14  12  CREATININE 0.65 0.68  CALCIUM 7.8* 8.2*  MG 1.7 1.8   Liver Function Tests Recent Labs    03/18/19 0509 03/19/19 0359  AST 480* 400*  ALT 957* 722*  ALKPHOS 231* 206*  BILITOT 2.6* 3.0*  PROT 5.4* 5.3*  ALBUMIN 2.5* 2.4*   No results for input(s): LIPASE, AMYLASE in the last 72 hours. Cardiac Enzymes No results for input(s): CKTOTAL, CKMB, CKMBINDEX, TROPONINI in the last 72 hours.  BNP: BNP (last 3 results) Recent Labs    03/14/19 1832  BNP 1,338.6*    ProBNP (last 3 results) No results for input(s): PROBNP in the last 8760 hours.   D-Dimer No results for input(s): DDIMER in the last 72 hours. Hemoglobin A1C No results for input(s): HGBA1C in the last 72 hours. Fasting Lipid Panel No results for input(s): CHOL, HDL, LDLCALC, TRIG, CHOLHDL, LDLDIRECT in the last 72 hours. Thyroid Function Tests No results for input(s): TSH, T4TOTAL, T3FREE, THYROIDAB in the last 72 hours.  Invalid input(s): FREET3  Other results:   Imaging    No results found.   Medications:     Scheduled Medications: . Chlorhexidine Gluconate Cloth  6 each Topical Daily  . clopidogrel  75 mg Oral Daily  . digoxin  0.125 mg Oral Daily  .  enoxaparin (LOVENOX) injection  55 mg Subcutaneous Q24H  . furosemide  80 mg Intravenous BID  . insulin aspart  0-15 Units Subcutaneous Q4H  . losartan  12.5 mg Oral Daily  . mouth rinse  15 mL Mouth Rinse BID  . potassium chloride  40 mEq Oral BID  . sodium chloride flush  3 mL Intravenous Q12H  . spironolactone  25 mg Oral Daily    Infusions: . sodium chloride    . sodium chloride    . sodium chloride    . milrinone 0.25 mcg/kg/min (03/19/19 0352)    PRN Medications: sodium chloride, sodium chloride, ondansetron **OR** ondansetron (ZOFRAN) IV, Resource ThickenUp Clear, sodium chloride flush    Assessment/Plan   1. Shock: Suspect primarily cardiogenic shock.  Prior echo in 2018 with EF 30-35%, now echo with EF down to 10-15%  with severe RV dysfunction. Cause of cardiomyopathy uncertain though ECG is suggestive of prior MI with anterior Qs.  No ACS this admission (HS-TnI in the 100s range with no trend). He is now off norepinephrine with improved co-ox (84%) on milrinone 0.25. Volume status improved, good diuresis yesterday.  CVP 10.  - Decrease milrinone to 0.125 mcg/kg/min.  - Continue Lasix 80 mg IV bid for 1 more day.  - Continue digoxin 0.125 daily.   - Increase spironolactone to 25 mg daily.  - Add losartan 12.5 mg daily.  - RHC/LHC today, discussed risks/benefits with patient and he agrees to procedure.  2. ID: Initial concern for RLL PNA on CT chest with septic shock. However, afebrile and WBCs only mildly elevated. Cultures remains negative. Afebrile  - He is currently off abx (per CCM).  3. Elevated LFTs: Suspect due to shock liver with hypotension.  LFTs now trending down.   4. H/o CVA: Residual left-sided weakness as well as some cognitive dysfunction per notes.  However, he lives on his own.  - He continues on Plavix.  - Can start statin as LFTs trend down => will start today.  5. Hyponatremia: Hypervolemic hyponatremia, fluid restrict.   6. PAD: Feet with possible ischemic changes.  Moderate RLE vascular disease by ABIs, left normal.   7. Hypomagnesemia/hypokalemia - supp  Length of Stay: 6  Floyce Bujak, MD  03/19/2019, 8:31 AM  Advanced Heart Failure Team Pager [phone] (M-F; 7a - 4p)  Please contact [facility] for night-coverage after hours (4p -7a ) and weekends on [email or web address]  

## 2019-03-19 NOTE — Progress Notes (Signed)
SLP Cancellation Note  Patient Details Name: Matthew Mccormick MRN: [id number] DOB: 12/11/1955   Cancelled treatment:       Reason Eval/Treat Not Completed: Patient not medically ready(Pt is NPO for procedure. SLP will f/u on subsequent date. )  Kamen Hanken I. Vear Clock, MS, CCC-SLP Acute Rehabilitation Services Office number [phone] Pager [phone]  Scheryl Marten 03/19/2019, 11:36 AM

## 2019-03-20 DIAGNOSIS — I509 Heart failure, unspecified: Secondary | ICD-10-CM

## 2019-03-20 LAB — BASIC METABOLIC PANEL
Anion gap: 12 (ref 5–15)
BUN: 11 mg/dL (ref 8–23)
CO2: 31 mmol/L (ref 22–32)
Calcium: 8.3 mg/dL — ABNORMAL LOW (ref 8.9–10.3)
Chloride: 87 mmol/L — ABNORMAL LOW (ref 98–111)
Creatinine, Ser: 0.62 mg/dL (ref 0.61–1.24)
GFR calc Af Amer: >60 mL/min (ref >60)
GFR calc non Af Amer: >60 mL/min (ref >60)
Glucose, Bld: 167 mg/dL — ABNORMAL HIGH (ref 70–99)
Potassium: 3.6 mmol/L (ref 3.5–5.1)
Sodium: 130 mmol/L — ABNORMAL LOW (ref 135–145)

## 2019-03-20 LAB — CBC
HCT: 42.2 % (ref 39.0–52.0)
Hemoglobin: 14 g/dL (ref 13.0–17.0)
MCH: 30.6 pg (ref 26.0–34.0)
MCHC: 33.2 g/dL (ref 30.0–36.0)
MCV: 92.1 fL (ref 80.0–100.0)
Platelets: 191 10*3/uL (ref 150–400)
RBC: 4.58 MIL/uL (ref 4.22–5.81)
RDW: 13.5 % (ref 11.5–15.5)
WBC: 11.3 10*3/uL — ABNORMAL HIGH (ref 4.0–10.5)
nRBC: 0 % (ref 0.0–0.2)

## 2019-03-20 LAB — GLUCOSE, CAPILLARY
Glucose-Capillary: 117 mg/dL — ABNORMAL HIGH (ref 70–99)
Glucose-Capillary: 138 mg/dL — ABNORMAL HIGH (ref 70–99)
Glucose-Capillary: 162 mg/dL — ABNORMAL HIGH (ref 70–99)
Glucose-Capillary: 166 mg/dL — ABNORMAL HIGH (ref 70–99)
Glucose-Capillary: 173 mg/dL — ABNORMAL HIGH (ref 70–99)
Glucose-Capillary: 235 mg/dL — ABNORMAL HIGH (ref 70–99)

## 2019-03-20 LAB — HEPATIC FUNCTION PANEL
ALT: 535 U/L — ABNORMAL HIGH (ref 0–44)
AST: 203 U/L — ABNORMAL HIGH (ref 15–41)
Albumin: 2.3 g/dL — ABNORMAL LOW (ref 3.5–5.0)
Alkaline Phosphatase: 173 U/L — ABNORMAL HIGH (ref 38–126)
Bilirubin, Direct: 1 mg/dL — ABNORMAL HIGH (ref 0.0–0.2)
Indirect Bilirubin: 2 mg/dL — ABNORMAL HIGH (ref 0.3–0.9)
Total Bilirubin: 3 mg/dL — ABNORMAL HIGH (ref 0.3–1.2)
Total Protein: 5.4 g/dL — ABNORMAL LOW (ref 6.5–8.1)

## 2019-03-20 LAB — COOXEMETRY PANEL
Carboxyhemoglobin: 2.8 % — ABNORMAL HIGH (ref 0.5–1.5)
Methemoglobin: 1.2 % (ref 0.0–1.5)
O2 Saturation: 79 %
Total hemoglobin: 7.3 g/dL — ABNORMAL LOW (ref 12.0–16.0)

## 2019-03-20 MED ORDER — FUROSEMIDE 80 MG PO TABS: 80.0000 mg | ORAL_TABLET | Freq: Every day | ORAL | Status: DC

## 2019-03-20 MED ORDER — LOSARTAN POTASSIUM 25 MG PO TABS
12.5000 mg | ORAL_TABLET | Freq: Two times a day (BID) | ORAL | Status: DC
  Administered 2019-03-20 – 2019-03-21 (×2): 12.5 mg via ORAL
  Filled 2019-03-20 (×3): qty 1

## 2019-03-20 MED ORDER — FUROSEMIDE 80 MG PO TABS
80.0000 mg | ORAL_TABLET | Freq: Every day | ORAL | Status: DC
  Administered 2019-03-21: 80 mg via ORAL
  Filled 2019-03-20: qty 1

## 2019-03-20 NOTE — Progress Notes (Signed)
Foley catheter removed at 1400. External catheter applied. Mikal Plane, BSN,

## 2019-03-20 NOTE — Progress Notes (Signed)
  Speech Language Pathology Treatment: Dysphagia  Patient Details Name: Matthew Mccormick MRN: [id number] DOB: [birth year] Today's Date: 03/20/2019 Time: 3299-2426 SLP Time Calculation (min) (ACUTE ONLY): 10 min  Assessment / Plan / Recommendation Clinical Impression  Dysphagia appears to have resolved - pt consumed 4 oz clear water with no s/s of aspiration, brisk swallow response, good attention to cup/straw. Tolerates purees with adequate attention, no oral residue, no deficits. No further concerns for aspiration.  Recommend advancing diet per MD's discretion - there are no restrictions related to swallowing function (regular solids should be fine).  No further SLP is needed - our service will sign off.   HPI HPI: Matthew Mccormick is 64 year old with history of HTN, hyperlipidemia, DMII, and CVA with left hemiparesis. [facility] with weakness and fatigue. CT of head negative for acute findings but did have chronic infarcts right occipital lobe, left frontal lobe and posterior parietal region on the left. Found to have cardiogenic shock and Acute metabolic encephalopathy. CXR Mild bibasilar airspace opacification, right greater than left, and small right pleural effusion, findings which may be due to mild congestive heart failure. Pneumonia      SLP Plan  All goals met       Recommendations  Diet recommendations: Thin liquid Medication Administration: Whole meds with puree Supervision: Staff to assist with self feeding                Oral Care Recommendations: Oral care BID Follow up Recommendations: None SLP Visit Diagnosis: Dysphagia, pharyngeal phase (R13.13) Plan: All goals met       GO              Matthew Mccormick L. Tivis Ringer, [place] CCC/SLP Acute Rehabilitation Services Office number [phone]    Matthew Mccormick 03/20/2019, 2:04 PM

## 2019-03-20 NOTE — Plan of Care (Signed)
  Problem: Health Behavior/Discharge Planning: Goal: Ability to manage health-related needs will improve Outcome: Not Met (add Reason)  At this point-pt will be unable to care for himself independently and will most likely require SNF at discharge. PT/OT evaluation pending

## 2019-03-20 NOTE — Progress Notes (Signed)
1315 Read PT's notes. Patient has only walked 5 ft as asst x 2. We will continue to follow his progress with PT and begin seeing when appropriate for our services. Luetta Nutting RN BSN 03/20/2019 1:16 PM

## 2019-03-20 NOTE — Progress Notes (Signed)
Physical Therapy Treatment Patient Details Name: Matthew Mccormick MRN: [id number] DOB: Mar 29, 1955 Today's Date: 03/20/2019    History of Present Illness Pt is 64 y/o M presents to the ER today after having had a fall at home.  He was trying to go to the bathroom and fell between his bed and dresser. Work up showed sepsis, R lower lobe PNA, peripheral cyanosis with increased INR, and elevated troponins with abnormal ECGs. PMH includes CKD, DM2, HTN, and stroke.    PT Comments    Patient received in bed, pleasant and willing to work with therapy, stating "I don't know if I can do it but I'll try" throughout session. See below for mobility/assist levels. Continues to require heavy assist from 2 people, once standing had heavy lean backwards on bed and needed ModA to bring weight forward, fatigued very quick today, HR to 126BPM and much more unsteady on feet/easily fatigued than reported in last note. Able to take sidesteps up EOB but he then sat down and stated he was very tired. Returned to bed and left with bed in chair position to 60 degrees, bed alarm active. Had planned to get him up to chair but RN requested lift pad, none available this afternoon. Will continue to follow acutely.     Follow Up Recommendations  SNF     Equipment Recommendations  None recommended by PT    Recommendations for Other Services       Precautions / Restrictions Precautions Precautions: Fall Restrictions Weight Bearing Restrictions: No    Mobility  Bed Mobility Overal bed mobility: Needs Assistance Bed Mobility: Supine to Sit;Sit to Supine     Supine to sit: Mod assist;+2 for physical assistance Sit to supine: Mod assist;+2 for physical assistance   General bed mobility comments: modAx2 for safety, sequencing and lines  Transfers Overall transfer level: Needs assistance Equipment used: Rolling walker (2 wheeled) Transfers: Sit to/from Stand Sit to Stand: Mod assist;+2 physical assistance;+2  safety/equipment         General transfer comment: Mod assist +2 for power up, steadying, correction of R posterior leaning. Very unsteady on feet and leaning on bedrail. HR to 126BPM  Ambulation/Gait             General Gait Details: deferred gait due to high levels of fatigue and elevated HR today, able to take side steps alongside EOB with ModAx2   Stairs             Wheelchair Mobility    Modified Rankin (Stroke Patients Only)       Balance Overall balance assessment: Needs assistance Sitting-balance support: Feet supported;Bilateral upper extremity supported Sitting balance-Leahy Scale: Fair Sitting balance - Comments: S to minG to maintain sitting balance today   Standing balance support: During functional activity;Bilateral upper extremity supported Standing balance-Leahy Scale: Poor Standing balance comment: reliant on external support                            Cognition Arousal/Alertness: Awake/alert Behavior During Therapy: WFL for tasks assessed/performed Overall Cognitive Status: No family/caregiver present to determine baseline cognitive functioning                                 General Comments: Increased processing time, [facility]      Exercises      General Comments General comments (skin integrity, edema, etc.): HR 126BPM with  mobility, VSS otherwise, but very fatigued today      Pertinent Vitals/Pain Pain Assessment: Faces Pain Score: 0-No pain Faces Pain Scale: No hurt Pain Intervention(s): Limited activity within patient's tolerance;Monitored during session    Home Living                      Prior Function            PT Goals (current goals can now be found in the care plan section) Acute Rehab PT Goals Patient Stated Goal: return home after rehab PT Goal Formulation: With patient Time For Goal Achievement: 03/28/19 Potential to Achieve Goals: Good Progress towards PT goals: Progressing  toward goals    Frequency    Min 2X/week      PT Plan Current plan remains appropriate    Co-evaluation              AM-PAC PT "6 Clicks" Mobility   Outcome Measure  Help needed turning from your back to your side while in a flat bed without using bedrails?: A Little Help needed moving from lying on your back to sitting on the side of a flat bed without using bedrails?: A Lot Help needed moving to and from a bed to a chair (including a wheelchair)?: A Lot Help needed standing up from a chair using your arms (e.g., wheelchair or bedside chair)?: A Lot Help needed to walk in hospital room?: A Lot Help needed climbing 3-5 steps with a railing? : Total 6 Click Score: 12    End of Session Equipment Utilized During Treatment: Gait belt Activity Tolerance: Patient limited by fatigue Patient left: in bed;with call bell/phone within reach;with bed alarm set(in chair position to 60 degrees, RN had requested he have maximove pad for back to bed and none were available) Nurse Communication: Mobility status PT Visit Diagnosis: Unsteadiness on feet (R26.81);Other abnormalities of gait and mobility (R26.89);Muscle weakness (generalized) (M62.81)     Time: 9628-3662 PT Time Calculation (min) (ACUTE ONLY): 27 min  Charges:  $Therapeutic Activity: 23-37 mins                     Windell Norfolk, DPT, PN1   Supplemental Physical Therapist [place]    Pager [phone] Acute Rehab Office [phone]

## 2019-03-20 NOTE — Progress Notes (Addendum)
Patient ID: Matthew Mccormick, male   DOB: 1955-06-05, 64 y.o.   MRN: [id number]     Advanced Heart Failure Rounding Note  PCP-Cardiologist: Prentice Docker, MD   Subjective:   Yesterday milrinone was cut back to 0.125 mcg. CO-OX 79%   Diuresed with IV lasix. Brisk diuresis noted. Weight down another 3 pounds.   2/8 RHC/LHC with severe  3 vessel disease. Dr Shirlee Latch discussed with Dr Herbie Baltimore.  atherectomy/stenting left main into LAD, ramus, PLV (and ostial RCA depending on flow wire results). Would need Impella support. PCWP 26 RA 9 CO 4.5/CI 2.0   Denies chest pain. Denies SOB.    Objective:   Weight Range: 100 kg Body mass index is 29.9 kg/m.   Vital Signs:   Temp:  [97.5 F (36.4 C)-98.2 F (36.8 C)] 97.5 F (36.4 C) (02/09 0355) Pulse Rate:  [0-121] 102 (02/09 0012) Resp:  [0-48] 11 (02/09 0352) BP: (85-114)/(55-89) 101/79 (02/09 0012) SpO2:  [0 %-100 %] 96 % (02/09 0012) Weight:  [100 kg] 100 kg (02/09 0355) Last BM Date: 03/19/19(very small)  Weight change: Filed Weights   03/18/19 0411 03/19/19 0614 03/20/19 0355  Weight: 107.6 kg 101.6 kg 100 kg    Intake/Output:   Intake/Output Summary (Last 24 hours) at 03/20/2019 0820 Last data filed at 03/20/2019 0400 Gross per 24 hour  Intake 53.11 ml  Output 4050 ml  Net -3996.89 ml      Physical Exam   CVP 3 personally checked.  General:   No resp difficulty HEENT: normal Neck: supple. no JVD. Carotids 2+ bilat; no bruits. No lymphadenopathy or thryomegaly appreciated. Cor: PMI nondisplaced. Regular rate & rhythm. No rubs, gallops or murmurs. Lungs: clear Abdomen: soft, nontender, nondistended. No hepatosplenomegaly. No bruits or masses. Good bowel sounds. Extremities: no cyanosis, clubbing, rash, R and LLE heel boots. RLE dressing in place Neuro: alert & orientedx3, cranial nerves grossly intact. moves all 4 extremities w/o difficulty. Affect flat    Telemetry   NSR 90s with occasional PVCs.   Labs     CBC Recent Labs    03/19/19 0359 03/19/19 0359 03/19/19 1245 03/20/19 0410  WBC 11.7*  --   --  11.3*  HGB 13.7   < > 15.6 14.0  HCT 40.8   < > 46.0 42.2  MCV 91.3  --   --  92.1  PLT 172  --   --  191   < > = values in this interval not displayed.   Basic Metabolic Panel Recent Labs    85/63/14 0509 03/18/19 0509 03/19/19 0359 03/19/19 0359 03/19/19 1245 03/20/19 0410  NA 129*   < > 127*   < > 131* 130*  K 3.5   < > 4.0   < > 3.7 3.6  CL 93*   < > 85*  --   --  87*  CO2 29   < > 29  --   --  31  GLUCOSE 155*   < > 154*  --   --  167*  BUN 14   < > 12  --   --  11  CREATININE 0.65   < > 0.68  --   --  0.62  CALCIUM 7.8*   < > 8.2*  --   --  8.3*  MG 1.7  --  1.8  --   --   --    < > = values in this interval not displayed.   Liver Function Tests Recent  Labs    03/19/19 0359 03/20/19 0410  AST 400* 203*  ALT 722* 535*  ALKPHOS 206* 173*  BILITOT 3.0* 3.0*  PROT 5.3* 5.4*  ALBUMIN 2.4* 2.3*   No results for input(s): LIPASE, AMYLASE in the last 72 hours. Cardiac Enzymes No results for input(s): CKTOTAL, CKMB, CKMBINDEX, TROPONINI in the last 72 hours.  BNP: BNP (last 3 results) Recent Labs    03/14/19 1832  BNP 1,338.6*    ProBNP (last 3 results) No results for input(s): PROBNP in the last 8760 hours.   D-Dimer No results for input(s): DDIMER in the last 72 hours. Hemoglobin A1C No results for input(s): HGBA1C in the last 72 hours. Fasting Lipid Panel No results for input(s): CHOL, HDL, LDLCALC, TRIG, CHOLHDL, LDLDIRECT in the last 72 hours. Thyroid Function Tests No results for input(s): TSH, T4TOTAL, T3FREE, THYROIDAB in the last 72 hours.  Invalid input(s): FREET3  Other results:   Imaging    CARDIAC CATHETERIZATION  Result Date: 03/19/2019 1. Filling pressures remain mildly elevated. 2. Mild pulmonary venous hypertension.  3. Low cardiac index around 2. 4. Severe 3 vessel coronary disease.  I do not think that he is going to be a  good CABG candidate due to poor functional capacity at baseline.  Discussed with Dr. Ellyn Hack, may be candidate for atherectomy/stenting left main into LAD, ramus, PLV (and ostial RCA depending on flow wire results).  Will discuss with colleagues.  Would need Impella support.     Medications:     Scheduled Medications: . atorvastatin  40 mg Oral q1800  . Chlorhexidine Gluconate Cloth  6 each Topical Daily  . clopidogrel  75 mg Oral Daily  . digoxin  0.125 mg Oral Daily  . enoxaparin (LOVENOX) injection  55 mg Subcutaneous Q24H  . furosemide  80 mg Intravenous BID  . insulin aspart  0-15 Units Subcutaneous Q4H  . losartan  12.5 mg Oral Daily  . mouth rinse  15 mL Mouth Rinse BID  . potassium chloride  40 mEq Oral BID  . sodium chloride flush  3 mL Intravenous Q12H  . sodium chloride flush  3 mL Intravenous Q12H  . spironolactone  25 mg Oral Daily    Infusions: . sodium chloride    . sodium chloride    . milrinone 0.125 mcg/kg/min (03/19/19 2206)    PRN Medications: sodium chloride, sodium chloride, acetaminophen, ondansetron (ZOFRAN) IV, ondansetron **OR** [DISCONTINUED] ondansetron (ZOFRAN) IV, Resource ThickenUp Clear, sodium chloride flush    Assessment/Plan   1. Shock: Suspect primarily cardiogenic shock.  Prior echo in 2018 with EF 30-35%, now echo with EF down to 10-15% with severe RV dysfunction. Cause of cardiomyopathy uncertain though ECG is suggestive of prior MI with anterior Qs.  No ACS this admission (HS-TnI in the 100s range with no trend).  - CO-OX stable on 0.125 milrinone. Stop milrinone today.  - Stop IV lasix. Start lasix 80 mg po daily.  - Continue digoxin 0.125 daily.   - Continue spironolactone to 25 mg daily.  - Continue losartan 12.5 mg daily.  -Renal function stable.  2. ID: Initial concern for RLL PNA on CT chest with septic shock. However, afebrile and WBCs only mildly elevated. Cultures remains negative. Afebrile  - He is currently off abx (per  CCM).  3. Elevated LFTs: Suspect due to shock liver with hypotension.   - LFTs coming down.    4. H/o CVA: Residual left-sided weakness as well as some cognitive dysfunction per notes.    -  He continues on Plavix.  -LFTs continue to fall.  - Continue statin.   5. Hyponatremia: Sodium 130. Restrict free water.    6. PAD: Feet with possible ischemic changes.  Moderate RLE vascular disease by ABIs, left normal. 7. Hypomagnesemia/hypokalemia - supp 8. CAD  - on atorvastatin + plavix.  Severe 3 vessel disease. Per Dr Shirlee Latch. Discussed with Dr. Herbie Baltimore, may be candidate for atherectomy/stenting left main into LAD, ramus, PLV (and ostial RCA depending on flow wire results).    Would need Impella support.  Consult cardiac rehab.    Length of Stay: 7  Amy Clegg, NP  03/20/2019, 8:20 AM  Advanced Heart Failure Team Pager [phone] (M-F; 7a - 4p)  Please contact [facility] for night-coverage after hours (4p -7a ) and weekends on [email or web address]  Patient seen with NP, agree with the above note.   Cath yesterday with severe 3 vessel disease.  Today, co-ox 79% with CVP 3.  No complaints.  Has not been out of bed.   General: NAD Neck: No JVD, no thyromegaly or thyroid nodule.  Lungs: Clear to auscultation bilaterally with normal respiratory effort. CV: Nondisplaced PMI.  Heart regular S1/S2, no S3/S4, no murmur.  No peripheral edema.   Abdomen: Soft, nontender, no hepatosplenomegaly, no distention.  Skin: Intact without lesions or rashes.  Neurologic: Alert and oriented x 3.  Psych: Normal affect. Extremities: No clubbing or cyanosis.  HEENT: Normal.   I think that we can stop milrinone today.  Continue digoxin and spironolactone, increase losartan to 12.5 mg bid.  Stop IV Lasix (got dose this morning), start Lasix 80 mg po daily tomorrow.   I discussed cath films yesterday with Dr. Herbie Baltimore.  Mr Mans is a poor CABG candidate with poor functional status at baseline.  Current plan is for  multivessel PCI with Impella support on Thursday.  He will likely need the Impella through the left femoral given PAD on arterial dopplers on right.   Mobilize with PT/cardiac rehab.   Marca Ancona 03/20/2019 8:52 AM

## 2019-03-20 NOTE — Progress Notes (Signed)
PROGRESS NOTE    Matthew Mccormick  WUJ:[id number] DOB: [birth year] DOA: 03/13/2019 PCP: Kirstie Peri, MD   Brief Narrative: Patient is a 64 year old male with history of atrial fibrillation, diabetes type 2, hypertension, thombotic stroke who presented at [facility] initially with several days of weakness, malaise, nausea, vomiting.  He was hypotensive on presentation.  He was suspected to have  cardiogenic shock and was transferred to [facility] for further management. Heart failure team is following.  He underwent cardiac cath on 03/19/2019 with finding of multivessel disease.  Planning for atherectomy /stent placement to left main on Thursday.  Assessment & Plan:   Active Problems:   Sepsis (HCC)   Pressure injury of skin   Ischemic hepatitis   Cardiogenic shock:  Cardiology team following.  On Lasix,spironolactone,digoxin,losartan.Plan to stop milrinone today. Prior echo in 2018 showed ejection fraction of 30 to 35%, current echo showing EF of 10 to 15% with severe right ventricular dysfunction. Initially started on norepinephrine, now off. Underwent right/left heart catheterization on 03/19/2019 with finding of multivessel disease.  Plan for atherectomy and stent placement to left main on Thursday with impella support,  He is a poor candidate for CABG.  Acute metabolic encephalopathy: Currently alert and oriented.  Patient lives by himself  in [place].  Acute hypoxemic respiratory failure: Present on admission.  Suspected to be from acute pulmonary edema from heart failure.  Chest x-ray did not show pneumonia.  CT chest was concerning for right lower lobe pneumonia but he is afebrile without leukocytosis.  Not on antibiotics.  Continue diuresis  Hypertension: Currently blood pressure stable.  Continuecurrent regimen  Diabetes mellitus 2: Continue sliding-scale insulin.  On insulin at home  History of CVA: Continue Plavix.  Has history of residual left-sided weakness with some  cognitive dysfunction.  Not on statin due to elevated liver enzymes.  Elevated liver enzymes: Secondary to combination of hepatic condition, shock liver.  Liver enzymes trending down.  Suspected peripheral artery disease: Has a bilateral small ulcerations on bilateral legs.Present on admission.ABI showed moderate right lower  extremity arterial disease .  Will recommend follow-up with vascular surgery as an outpatient.Conitnue plavix.  Will resume statin when liver enzymes further improve.  Hyponatremia: Secondary to hypervolemic hyponatremia from CHF.  Continue to monitor.  Sodium is 130 today.  Pressure Injury 03/13/19 Sacrum Stage 1 -  Intact skin with non-blanchable redness of a localized area usually over a bony prominence. (Active)  03/13/19 1700  Location: Sacrum  Location Orientation:   Staging: Stage 1 -  Intact skin with non-blanchable redness of a localized area usually over a bony prominence.  Wound Description (Comments):   Present on Admission: Yes              DVT prophylaxis:Lovenox Code Status: Full Family Communication: None present at the bedside Disposition Plan: Patient is from home.  PT/oT recommending SnF.  He is waiting for further procedure from cardiology.  Consultants: Cardiology  Procedures:None  Antimicrobials:  Anti-infectives (From admission, onward)   Start     Dose/Rate Route Frequency Ordered Stop   03/14/19 1200  cefTRIAXone (ROCEPHIN) 2 g in sodium chloride 0.9 % 100 mL IVPB  Status:  Discontinued     2 g 200 mL/hr over 30 Minutes Intravenous Every 24 hours 03/14/19 0951 03/15/19 0954   03/14/19 1100  azithromycin (ZITHROMAX) 500 mg in sodium chloride 0.9 % 250 mL IVPB  Status:  Discontinued     500 mg 250 mL/hr over 60  Minutes Intravenous Every 24 hours 03/14/19 0951 03/15/19 0954   03/14/19 0000  vancomycin (VANCOREADY) IVPB 1250 mg/250 mL  Status:  Discontinued     1,250 mg 166.7 mL/hr over 90 Minutes Intravenous Every 12 hours  03/13/19 1158 03/14/19 0951   03/13/19 2000  ceFEPIme (MAXIPIME) 2 g in sodium chloride 0.9 % 100 mL IVPB  Status:  Discontinued     2 g 200 mL/hr over 30 Minutes Intravenous Every 8 hours 03/13/19 1058 03/14/19 0951   03/13/19 1030  vancomycin (VANCOCIN) IVPB 1000 mg/200 mL premix  Status:  Discontinued     1,000 mg 200 mL/hr over 60 Minutes Intravenous Every 1 hr x 2 03/13/19 1001 03/13/19 1002   03/13/19 1030  vancomycin (VANCOREADY) IVPB 2000 mg/400 mL     2,000 mg 200 mL/hr over 120 Minutes Intravenous  Once 03/13/19 1002 03/13/19 1343   03/13/19 1000  ceFEPIme (MAXIPIME) 2 g in sodium chloride 0.9 % 100 mL IVPB     2 g 200 mL/hr over 30 Minutes Intravenous  Once 03/13/19 0952 03/13/19 1103   03/13/19 1000  metroNIDAZOLE (FLAGYL) IVPB 500 mg     500 mg 100 mL/hr over 60 Minutes Intravenous  Once 03/13/19 0952 03/13/19 1141   03/13/19 1000  vancomycin (VANCOCIN) IVPB 1000 mg/200 mL premix  Status:  Discontinued     1,000 mg 200 mL/hr over 60 Minutes Intravenous  Once 03/13/19 3818 03/13/19 1001      Subjective:  Patient seen and examined the bedside this morning.  Hemodynamically stable.  Denies any chest pain or shortness of breath.  No new complaints..  Objective: Vitals:   03/20/19 0352 03/20/19 0355 03/20/19 0845 03/20/19 1300  BP:   118/72 114/70  Pulse:   (!) 101 99  Resp: 11  15 14   Temp:  (!) 97.5 F (36.4 C) 98.5 F (36.9 C) (!) 97.2 F (36.2 C)  TempSrc:  Oral Oral Oral  SpO2:   97% 98%  Weight:  100 kg    Height:        Intake/Output Summary (Last 24 hours) at 03/20/2019 1439 Last data filed at 03/20/2019 1400 Gross per 24 hour  Intake 415.13 ml  Output 3800 ml  Net -3384.87 ml   Filed Weights   03/18/19 0411 03/19/19 0614 03/20/19 0355  Weight: 107.6 kg 101.6 kg 100 kg    Examination:  General exam: Generalized weakness, chronically ill looking HEENT:PERRL,Oral mucosa moist, Ear/Nose normal on gross exam Respiratory system: Bilateral equal air  entry, normal vesicular breath sounds, no wheezes or crackles  Cardiovascular system: Sinus tachycardia. No JVD, murmurs, rubs, gallops or clicks. No pedal edema.  Central line on the left chest Gastrointestinal system: Abdomen is nondistended, soft and nontender. No organomegaly or masses felt. Normal bowel sounds heard. Central nervous system: Alert and oriented. No focal neurological deficits. Extremities:  no clubbing ,no cyanosis Skin: No rashes, lesions ,no icterus ,no pallor,  ulcers on bilateral lower extremities    Data Reviewed: I have personally reviewed following labs and imaging studies  CBC: Recent Labs  Lab 03/16/19 0854 03/16/19 0854 03/17/19 0500 03/18/19 0509 03/19/19 0359 03/19/19 1245 03/20/19 0410  WBC 13.7*  --  12.3* 10.6* 11.7*  --  11.3*  HGB 14.7   < > 13.7 13.7 13.7 15.6 14.0  HCT 44.8   < > 40.8 41.5 40.8 46.0 42.2  MCV 93.7  --  91.7 92.6 91.3  --  92.1  PLT 160  --  182  158 172  --  191   < > = values in this interval not displayed.   Basic Metabolic Panel: Recent Labs  Lab 03/15/19 0415 03/16/19 0419 03/16/19 1500 03/16/19 1500 03/17/19 0500 03/18/19 0509 03/19/19 0359 03/19/19 1245 03/20/19 0410  NA  --    < > 134*   < > 133* 129* 127* 131* 130*  K  --    < > 3.7   < > 3.6 3.5 4.0 3.7 3.6  CL  --    < > 98  --  97* 93* 85*  --  87*  CO2  --    < > 24  --  28 29 29   --  31  GLUCOSE  --    < > 234*  --  150* 155* 154*  --  167*  BUN  --    < > 22  --  19 14 12   --  11  CREATININE  --    < > 0.90  --  0.68 0.65 0.68  --  0.62  CALCIUM  --    < > 7.6*  --  7.9* 7.8* 8.2*  --  8.3*  MG 2.1  --   --   --   --  1.7 1.8  --   --    < > = values in this interval not displayed.   GFR: Estimated Creatinine Clearance: 115.8 mL/min (by C-G formula based on SCr of 0.62 mg/dL). Liver Function Tests: Recent Labs  Lab 03/16/19 0419 03/17/19 0500 03/18/19 0509 03/19/19 0359 03/20/19 0410  AST 820* 533* 480* 400* 203*  ALT 1,313* 1,080* 957*  722* 535*  ALKPHOS 216* 248* 231* 206* 173*  BILITOT 2.6* 2.3* 2.6* 3.0* 3.0*  PROT 4.8* 5.0* 5.4* 5.3* 5.4*  ALBUMIN 2.4* 2.3* 2.5* 2.4* 2.3*   No results for input(s): LIPASE, AMYLASE in the last 168 hours. No results for input(s): AMMONIA in the last 168 hours. Coagulation Profile: Recent Labs  Lab 03/14/19 0605 03/14/19 1233 03/16/19 0419 03/17/19 0500  INR 2.2* 2.6* 2.6* 1.9*   Cardiac Enzymes: No results for input(s): CKTOTAL, CKMB, CKMBINDEX, TROPONINI in the last 168 hours. BNP (last 3 results) No results for input(s): PROBNP in the last 8760 hours. HbA1C: No results for input(s): HGBA1C in the last 72 hours. CBG: Recent Labs  Lab 03/19/19 2121 03/20/19 0005 03/20/19 0346 03/20/19 0804 03/20/19 1140  GLUCAP 179* 173* 117* 138* 166*   Lipid Profile: No results for input(s): CHOL, HDL, LDLCALC, TRIG, CHOLHDL, LDLDIRECT in the last 72 hours. Thyroid Function Tests: No results for input(s): TSH, T4TOTAL, FREET4, T3FREE, THYROIDAB in the last 72 hours. Anemia Panel: No results for input(s): VITAMINB12, FOLATE, FERRITIN, TIBC, IRON, RETICCTPCT in the last 72 hours. Sepsis Labs: Recent Labs  Lab 03/14/19 1546 03/15/19 0203 03/16/19 0756 03/16/19 1156  LATICACIDVEN 4.4* 3.2* 1.4 2.1*    Recent Results (from the past 240 hour(s))  Blood Culture (routine x 2)     Status: None   Collection Time: 03/13/19 10:15 AM   Specimen: BLOOD RIGHT FOREARM  Result Value Ref Range Status   Specimen Description BLOOD RIGHT FOREARM DRAWN BY RN BP  Final   Special Requests   Final    Blood Culture results may not be optimal due to an inadequate volume of blood received in culture bottles   Culture   Final    NO GROWTH 5 DAYS Performed at [facility], [address], [place], [address] [place]  Report Status 03/18/2019 FINAL  Final  Blood Culture (routine x 2)     Status: None   Collection Time: 03/13/19 10:20 AM   Specimen: BLOOD LEFT FOREARM  Result Value Ref Range  Status   Specimen Description BLOOD LEFT FOREARM DRAWN BY RN BP  Final   Special Requests   Final    BOTTLES DRAWN AEROBIC AND ANAEROBIC Blood Culture results may not be optimal due to an inadequate volume of blood received in culture bottles   Culture   Final    NO GROWTH 5 DAYS Performed at [facility], [address], [place], [place] [address]    Report Status 03/18/2019 FINAL  Final  Respiratory Panel by RT PCR (Flu A&B, Covid) - Nasopharyngeal Swab     Status: None   Collection Time: 03/13/19 10:58 AM   Specimen: Nasopharyngeal Swab  Result Value Ref Range Status   SARS Coronavirus 2 by RT PCR NEGATIVE NEGATIVE Final    Comment: (NOTE) SARS-CoV-2 target nucleic acids are NOT DETECTED. The SARS-CoV-2 RNA is generally detectable in upper respiratoy specimens during the acute phase of infection. The lowest concentration of SARS-CoV-2 viral copies this assay can detect is 131 copies/mL. A negative result does not preclude SARS-Cov-2 infection and should not be used as the sole basis for treatment or other patient management decisions. A negative result may occur with  improper specimen collection/handling, submission of specimen other than nasopharyngeal swab, presence of viral mutation(s) within the areas targeted by this assay, and inadequate number of viral copies (<131 copies/mL). A negative result must be combined with clinical observations, patient history, and epidemiological information. The expected result is Negative. Fact Sheet for Patients:  PinkCheek.be Fact Sheet for Healthcare Providers:  GravelBags.it This test is not yet ap proved or cleared by the [place] FDA and  has been authorized for detection and/or diagnosis of SARS-CoV-2 by FDA under an Emergency Use Authorization (EUA). This EUA will remain  in effect (meaning this test can be used) for the duration of the COVID-19 declaration under Section  564(b)(1) of the Act, 21 U.S.C. section 360bbb-3(b)(1), unless the authorization is terminated or revoked sooner.    Influenza A by PCR NEGATIVE NEGATIVE Final   Influenza B by PCR NEGATIVE NEGATIVE Final    Comment: (NOTE) The Xpert Xpress SARS-CoV-2/FLU/RSV assay is intended as an aid in  the diagnosis of influenza from Nasopharyngeal swab specimens and  should not be used as a sole basis for treatment. Nasal washings and  aspirates are unacceptable for Xpert Xpress SARS-CoV-2/FLU/RSV  testing. Fact Sheet for Patients: PinkCheek.be Fact Sheet for Healthcare Providers: GravelBags.it This test is not yet approved or cleared by the [place] FDA and  has been authorized for detection and/or diagnosis of SARS-CoV-2 by  FDA under an Emergency Use Authorization (EUA). This EUA will remain  in effect (meaning this test can be used) for the duration of the  Covid-19 declaration under Section 564(b)(1) of the Act, 21  U.S.C. section 360bbb-3(b)(1), unless the authorization is  terminated or revoked. Performed at [facility], [address], [place], [place] [address]   MRSA PCR Screening     Status: None   Collection Time: 03/13/19  4:32 PM   Specimen: Nasal Mucosa; Nasopharyngeal  Result Value Ref Range Status   MRSA by PCR NEGATIVE NEGATIVE Final    Comment:        The GeneXpert MRSA Assay (FDA approved for NASAL specimens only), is one component of a comprehensive  MRSA colonization surveillance program. It is not intended to diagnose MRSA infection nor to guide or monitor treatment for MRSA infections. Performed at [facility], [address] [place], [place] [address]   Urine culture     Status: None   Collection Time: 03/14/19  6:00 AM   Specimen: In/Out Cath Urine  Result Value Ref Range Status   Specimen Description   Final    IN/OUT CATH URINE Performed at [facility], [address] [place], [place]  [address]    Special Requests   Final    NONE Performed at [facility], [address], [place], [place] [address]    Culture   Final    NO GROWTH Performed at [facility], [address] [place], [place] [address]    Report Status 03/15/2019 FINAL  Final         Radiology Studies: CARDIAC CATHETERIZATION  Result Date: 03/19/2019 1. Filling pressures remain mildly elevated. 2. Mild pulmonary venous hypertension.  3. Low cardiac index around 2. 4. Severe 3 vessel coronary disease.  I do not think that he is going to be a good CABG candidate due to poor functional capacity at baseline.  Discussed with Dr. Herbie Baltimore, may be candidate for atherectomy/stenting left main into LAD, ramus, PLV (and ostial RCA depending on flow wire results).  Will discuss with colleagues.  Would need Impella support.        Scheduled Meds: . atorvastatin  40 mg Oral q1800  . Chlorhexidine Gluconate Cloth  6 each Topical Daily  . clopidogrel  75 mg Oral Daily  . digoxin  0.125 mg Oral Daily  . enoxaparin (LOVENOX) injection  55 mg Subcutaneous Q24H  . [START ON 03/21/2019] furosemide  80 mg Oral Daily  . insulin aspart  0-15 Units Subcutaneous Q4H  . losartan  12.5 mg Oral BID  . mouth rinse  15 mL Mouth Rinse BID  . potassium chloride  40 mEq Oral BID  . sodium chloride flush  3 mL Intravenous Q12H  . sodium chloride flush  3 mL Intravenous Q12H  . spironolactone  25 mg Oral Daily   Continuous Infusions: . sodium chloride    . sodium chloride       LOS: 7 days    Time spent: 35 mins. More than 50% of that time was spent in counseling and/or coordination of care.      Burnadette Pop, MD [facility] P2/10/2019, 2:39 PM

## 2019-03-21 LAB — BLOOD GAS, ARTERIAL
Acid-Base Excess: 6.4 mmol/L — ABNORMAL HIGH (ref 0.0–2.0)
Bicarbonate: 29.5 mmol/L — ABNORMAL HIGH (ref 20.0–28.0)
Drawn by: 39898
FIO2: 26
O2 Saturation: 99.5 %
Patient temperature: 36.8
pCO2 arterial: 35 mmHg (ref 32.0–48.0)
pH, Arterial: 7.534 — ABNORMAL HIGH (ref 7.350–7.450)
pO2, Arterial: 160 mmHg — ABNORMAL HIGH (ref 83.0–108.0)

## 2019-03-21 LAB — CBC
HCT: 42.1 % (ref 39.0–52.0)
Hemoglobin: 13.9 g/dL (ref 13.0–17.0)
MCH: 31 pg (ref 26.0–34.0)
MCHC: 33 g/dL (ref 30.0–36.0)
MCV: 94 fL (ref 80.0–100.0)
Platelets: 203 10*3/uL (ref 150–400)
RBC: 4.48 MIL/uL (ref 4.22–5.81)
RDW: 13.9 % (ref 11.5–15.5)
WBC: 10.8 10*3/uL — ABNORMAL HIGH (ref 4.0–10.5)
nRBC: 0 % (ref 0.0–0.2)

## 2019-03-21 LAB — COOXEMETRY PANEL
Carboxyhemoglobin: 2 % — ABNORMAL HIGH (ref 0.5–1.5)
Carboxyhemoglobin: 1.4 % (ref 0.5–1.5)
Carboxyhemoglobin: 1.5 % (ref 0.5–1.5)
Methemoglobin: 1 % (ref 0.0–1.5)
Methemoglobin: 0.6 % (ref 0.0–1.5)
Methemoglobin: 0.9 % (ref 0.0–1.5)
O2 Saturation: 71.3 %
O2 Saturation: 69 %
O2 Saturation: 47.6 %
Total hemoglobin: 14.7 g/dL (ref 12.0–16.0)
Total hemoglobin: 13.8 g/dL (ref 12.0–16.0)
Total hemoglobin: 13.4 g/dL (ref 12.0–16.0)

## 2019-03-21 LAB — HEPATIC FUNCTION PANEL
ALT: 372 U/L — ABNORMAL HIGH (ref 0–44)
AST: 102 U/L — ABNORMAL HIGH (ref 15–41)
Albumin: 2.4 g/dL — ABNORMAL LOW (ref 3.5–5.0)
Alkaline Phosphatase: 157 U/L — ABNORMAL HIGH (ref 38–126)
Bilirubin, Direct: 0.8 mg/dL — ABNORMAL HIGH (ref 0.0–0.2)
Indirect Bilirubin: 1.4 mg/dL — ABNORMAL HIGH (ref 0.3–0.9)
Total Bilirubin: 2.2 mg/dL — ABNORMAL HIGH (ref 0.3–1.2)
Total Protein: 5.5 g/dL — ABNORMAL LOW (ref 6.5–8.1)

## 2019-03-21 LAB — BASIC METABOLIC PANEL
Anion gap: 9 (ref 5–15)
Anion gap: 12 (ref 5–15)
BUN: 12 mg/dL (ref 8–23)
BUN: 13 mg/dL (ref 8–23)
CO2: 29 mmol/L (ref 22–32)
CO2: 27 mmol/L (ref 22–32)
Calcium: 8.3 mg/dL — ABNORMAL LOW (ref 8.9–10.3)
Calcium: 8.4 mg/dL — ABNORMAL LOW (ref 8.9–10.3)
Chloride: 92 mmol/L — ABNORMAL LOW (ref 98–111)
Chloride: 90 mmol/L — ABNORMAL LOW (ref 98–111)
Creatinine, Ser: 0.65 mg/dL (ref 0.61–1.24)
Creatinine, Ser: 0.64 mg/dL (ref 0.61–1.24)
GFR calc Af Amer: >60 mL/min (ref >60)
GFR calc Af Amer: >60 mL/min (ref >60)
GFR calc non Af Amer: >60 mL/min (ref >60)
GFR calc non Af Amer: >60 mL/min (ref >60)
Glucose, Bld: 186 mg/dL — ABNORMAL HIGH (ref 70–99)
Glucose, Bld: 163 mg/dL — ABNORMAL HIGH (ref 70–99)
Potassium: 4.3 mmol/L (ref 3.5–5.1)
Potassium: 4.5 mmol/L (ref 3.5–5.1)
Sodium: 130 mmol/L — ABNORMAL LOW (ref 135–145)
Sodium: 129 mmol/L — ABNORMAL LOW (ref 135–145)

## 2019-03-21 LAB — CBC WITH DIFFERENTIAL/PLATELET
Abs Immature Granulocytes: 0.05 10*3/uL (ref 0.00–0.07)
Basophils Absolute: 0 10*3/uL (ref 0.0–0.1)
Basophils Relative: 0 %
Eosinophils Absolute: 0.2 10*3/uL (ref 0.0–0.5)
Eosinophils Relative: 1 %
HCT: 42.8 % (ref 39.0–52.0)
Hemoglobin: 13.9 g/dL (ref 13.0–17.0)
Immature Granulocytes: 1 %
Lymphocytes Relative: 12 %
Lymphs Abs: 1.3 10*3/uL (ref 0.7–4.0)
MCH: 30.7 pg (ref 26.0–34.0)
MCHC: 32.5 g/dL (ref 30.0–36.0)
MCV: 94.5 fL (ref 80.0–100.0)
Monocytes Absolute: 1.2 10*3/uL — ABNORMAL HIGH (ref 0.1–1.0)
Monocytes Relative: 12 %
Neutro Abs: 7.9 10*3/uL — ABNORMAL HIGH (ref 1.7–7.7)
Neutrophils Relative %: 74 %
Platelets: 202 10*3/uL (ref 150–400)
RBC: 4.53 MIL/uL (ref 4.22–5.81)
RDW: 13.8 % (ref 11.5–15.5)
WBC: 10.6 10*3/uL — ABNORMAL HIGH (ref 4.0–10.5)
nRBC: 0 % (ref 0.0–0.2)

## 2019-03-21 LAB — GLUCOSE, CAPILLARY
Glucose-Capillary: 165 mg/dL — ABNORMAL HIGH (ref 70–99)
Glucose-Capillary: 168 mg/dL — ABNORMAL HIGH (ref 70–99)
Glucose-Capillary: 182 mg/dL — ABNORMAL HIGH (ref 70–99)
Glucose-Capillary: 216 mg/dL — ABNORMAL HIGH (ref 70–99)
Glucose-Capillary: 221 mg/dL — ABNORMAL HIGH (ref 70–99)
Glucose-Capillary: 81 mg/dL (ref 70–99)

## 2019-03-21 LAB — MRSA PCR SCREENING: MRSA by PCR: NEGATIVE

## 2019-03-21 MED ORDER — ROSUVASTATIN CALCIUM 20 MG PO TABS
20.0000 mg | ORAL_TABLET | Freq: Every day | ORAL | Status: DC
  Administered 2019-03-21 – 2019-03-29 (×9): 20 mg via ORAL
  Filled 2019-03-21 (×9): qty 1

## 2019-03-21 MED ORDER — SODIUM CHLORIDE 0.9% FLUSH: 10.0000 mL | INTRAVENOUS | Status: DC | PRN

## 2019-03-21 MED ORDER — SODIUM CHLORIDE 0.9% FLUSH: 3.0000 mL | Freq: Two times a day (BID) | INTRAVENOUS | Status: DC

## 2019-03-21 MED ORDER — SODIUM CHLORIDE 0.9% FLUSH: 3.0000 mL | INTRAVENOUS | Status: DC | PRN

## 2019-03-21 MED ORDER — SODIUM CHLORIDE 0.9% FLUSH
10.0000 mL | Freq: Two times a day (BID) | INTRAVENOUS | Status: DC
  Administered 2019-03-21: 20 mL
  Administered 2019-03-22 – 2019-03-24 (×4): 10 mL
  Administered 2019-03-24: 30 mL
  Administered 2019-03-25 (×2): 10 mL
  Administered 2019-03-26: 30 mL
  Administered 2019-03-27: 20 mL
  Administered 2019-03-27 – 2019-03-29 (×3): 10 mL

## 2019-03-21 MED ORDER — SODIUM CHLORIDE 0.9 % IV SOLN: INTRAVENOUS | Status: DC

## 2019-03-21 MED ORDER — SODIUM CHLORIDE 0.9 % IV BOLUS
250.0000 mL | Freq: Once | INTRAVENOUS | Status: AC
  Administered 2019-03-21: 250 mL via INTRAVENOUS

## 2019-03-21 MED ORDER — ASPIRIN 81 MG PO CHEW
81.0000 mg | CHEWABLE_TABLET | Freq: Every day | ORAL | Status: DC
  Administered 2019-03-21 – 2019-03-30 (×8): 81 mg via ORAL
  Filled 2019-03-21 (×8): qty 1

## 2019-03-21 MED ORDER — ASPIRIN 81 MG PO CHEW
81.0000 mg | CHEWABLE_TABLET | ORAL | Status: AC
  Administered 2019-03-22: 81 mg via ORAL
  Filled 2019-03-21: qty 1

## 2019-03-21 MED ORDER — IVABRADINE HCL 5 MG PO TABS
5.0000 mg | ORAL_TABLET | Freq: Two times a day (BID) | ORAL | Status: DC
  Administered 2019-03-22 – 2019-03-30 (×16): 5 mg via ORAL
  Filled 2019-03-21 (×23): qty 1

## 2019-03-21 MED ORDER — SODIUM CHLORIDE 0.9 % IV SOLN: 250.0000 mL | INTRAVENOUS | Status: DC | PRN

## 2019-03-21 NOTE — TOC Progression Note (Signed)
Transition of Care [facility]) - Progression Note    Patient Details  Name: MALONE VANBLARCOM MRN: [id number] Date of Birth: Nov 02, 1955  Transition of Care [facility]) CM/SW [place], [facility] Phone Number: [phone] 03/21/2019, 11:39 AM  Clinical Narrative:    CSW met bedside with patient. CSW again discussed the recommendation of SNF placement at time of discharge. Patient expressed understanding of PT recommendation and is agreeable to SNF placement at time of discharge. Patient expressed he has previously been to [facility]. CSW discussed insurance authorization process and provided Medicare SNF ratings list. CSW asked patient if there is anyone he would like contacted about discharge plans, he provided verbal permission to contact [facility]. No further questions reported at this time. CSW to continue to follow and assist with discharge planning needs.    Expected Discharge Plan: [place] Barriers to Discharge: Continued Medical Work up  Expected Discharge Plan and Services Expected Discharge Plan: [place] arrangements for the past 2 months: Apartment                                       Social Determinants of Health (SDOH) Interventions    Readmission Risk Interventions No flowsheet data found.

## 2019-03-21 NOTE — Progress Notes (Addendum)
1130: NT reported to RN that pt's BP low 76/64, RN rechecked 82/62 at 1158. Pt's asymtomatic.  1326. RN rechecked BP  75/59, pt's asymtomatic. RN notified Cardiology  1344: [place] from CHF called back and want a manual BP, she's on the way to assess pt.   1357: RRT notified. RRT to come assess pt.   1432: received order for NS bolus 250cc.  Will continue to monitor.    1515: Bolus finished, BP 83/61, CHF team updated.

## 2019-03-21 NOTE — Progress Notes (Signed)
Patient ID: Matthew Mccormick, male   DOB: 1955-07-10, 64 y.o.   MRN: [id number] P    Advanced Heart Failure Rounding Note  PCP-Cardiologist: Prentice Docker, MD   Subjective:    He is off milrinone, co-ox 71%. I/Os negative. Remains in sinus tachy.  CVP 8 today. No chest pain or dyspnea today.   2/8 RHC/LHC with severe 3 vessel disease. Plan for atherectomy/stenting left main into LAD, ramus, PLV (and ostial RCA depending on flow wire results). Will need Impella support.    RHC/LHC: Coronary Findings  Diagnostic Dominance: Right Left Main  70-80% distal left main stenosis.  Left Anterior Descending  Diffuse 40% ostial/proximal LAD stenosis. Small D1, ostial 90% stenosis. Moderate D2, ostial 75% stenosis. Diffuse up to 40% mid LAD stenosis with occluded distal LAD. There are right to left collaterals.  Ramus Intermedius  Moderate vessel, 80% proximal stenosis.  Left Circumflex  60% distal LCx stenosis into small PLOM.  Right Coronary Artery  70-80% calcified ostial RCA stenosis. 30% mid RCA stenosis. Large PLV branch with 90% stenosis in the mid vessel. The PDA was diffusely diseased with up to 80% stenosis (smaller vessel than PLV). There are left to right collaterals likely to PLV territory.  Intervention  No interventions have been documented. Right Heart  Right Heart Pressures RHC Procedural Findings: Hemodynamics (mmHg) RA mean 9 RV 49/12 PA 54/18, mean 39 PCWP mean 26 LV 89/19 AO 91/62  Oxygen saturations: PA 64% AO 100%  Cardiac Output (Fick) 4.55  Cardiac Index (Fick) 1.98 PVR 2.85 WU     Objective:   Weight Range: 99.8 kg Body mass index is 29.84 kg/m.   Vital Signs:   Temp:  [97.2 F (36.2 C)-98.9 F (37.2 C)] 98.6 F (37 C) (02/10 0812) Pulse Rate:  [99-117] 111 (02/10 0610) Resp:  [12-20] 14 (02/10 0610) BP: (89-114)/(59-77) 100/77 (02/10 0812) SpO2:  [97 %-100 %] 100 % (02/10 0610) Weight:  [99.8 kg] 99.8 kg (02/10 0430) Last BM Date:  03/19/19  Weight change: Filed Weights   03/19/19 0614 03/20/19 0355 03/21/19 0430  Weight: 101.6 kg 100 kg 99.8 kg    Intake/Output:   Intake/Output Summary (Last 24 hours) at 03/21/2019 1053 Last data filed at 03/21/2019 0900 Gross per 24 hour  Intake 258.57 ml  Output 1200 ml  Net -941.43 ml      Physical Exam   CVP 8 personally checked.  General: NAD Neck: No JVD, no thyromegaly or thyroid nodule.  Lungs: Clear to auscultation bilaterally with normal respiratory effort. CV: Nondisplaced PMI.  Heart regular S1/S2, no S3/S4, no murmur.  Trace ankle edema.  N Abdomen: Soft, nontender, no hepatosplenomegaly, no distention.  Skin: Intact without lesions or rashes.  Neurologic: Alert and oriented x 3.  Psych: Normal affect. Extremities: No clubbing or cyanosis.  HEENT: Normal.    Telemetry   ST 100s (personally reviewed).   Labs    CBC Recent Labs    03/19/19 0359 03/19/19 0359 03/19/19 1245 03/20/19 0410  WBC 11.7*  --   --  11.3*  HGB 13.7   < > 15.6 14.0  HCT 40.8   < > 46.0 42.2  MCV 91.3  --   --  92.1  PLT 172  --   --  191   < > = values in this interval not displayed.   Basic Metabolic Panel Recent Labs    85/92/76 0359 03/19/19 1245 03/20/19 0410 03/21/19 0603  NA 127*   < > 130*  130*  K 4.0   < > 3.6 4.3  CL 85*  --  87* 92*  CO2 29  --  31 29  GLUCOSE 154*  --  167* 186*  BUN 12  --  11 12  CREATININE 0.68  --  0.62 0.65  CALCIUM 8.2*  --  8.3* 8.3*  MG 1.8  --   --   --    < > = values in this interval not displayed.   Liver Function Tests Recent Labs    03/20/19 0410 03/21/19 0603  AST 203* 102*  ALT 535* 372*  ALKPHOS 173* 157*  BILITOT 3.0* 2.2*  PROT 5.4* 5.5*  ALBUMIN 2.3* 2.4*   No results for input(s): LIPASE, AMYLASE in the last 72 hours. Cardiac Enzymes No results for input(s): CKTOTAL, CKMB, CKMBINDEX, TROPONINI in the last 72 hours.  BNP: BNP (last 3 results) Recent Labs    03/14/19 1832  BNP 1,338.6*     ProBNP (last 3 results) No results for input(s): PROBNP in the last 8760 hours.   D-Dimer No results for input(s): DDIMER in the last 72 hours. Hemoglobin A1C No results for input(s): HGBA1C in the last 72 hours. Fasting Lipid Panel No results for input(s): CHOL, HDL, LDLCALC, TRIG, CHOLHDL, LDLDIRECT in the last 72 hours. Thyroid Function Tests No results for input(s): TSH, T4TOTAL, T3FREE, THYROIDAB in the last 72 hours.  Invalid input(s): FREET3  Other results:   Imaging    No results found.   Medications:     Scheduled Medications: . atorvastatin  40 mg Oral q1800  . Chlorhexidine Gluconate Cloth  6 each Topical Daily  . clopidogrel  75 mg Oral Daily  . digoxin  0.125 mg Oral Daily  . enoxaparin (LOVENOX) injection  55 mg Subcutaneous Q24H  . furosemide  80 mg Oral Daily  . insulin aspart  0-15 Units Subcutaneous Q4H  . losartan  12.5 mg Oral BID  . mouth rinse  15 mL Mouth Rinse BID  . potassium chloride  40 mEq Oral BID  . sodium chloride flush  3 mL Intravenous Q12H  . sodium chloride flush  3 mL Intravenous Q12H  . spironolactone  25 mg Oral Daily    Infusions: . sodium chloride    . sodium chloride      PRN Medications: sodium chloride, sodium chloride, acetaminophen, ondansetron (ZOFRAN) IV, ondansetron **OR** [DISCONTINUED] ondansetron (ZOFRAN) IV, Resource ThickenUp Clear, sodium chloride flush    Assessment/Plan   1. Acute on chronic systolic CHF:  Initial cardiogenic shock.  Prior echo in 2018 with EF 30-35%, now echo with EF down to 10-15% with severe RV dysfunction. Ischemic cardiomyopathy based on cath.  No ACS this admission (HS-TnI in the 100s range with no trend).  Initially on milrinone, now off.  Remains in sinus tachy but good co-ox 71%, CVP 8. Creatinine stable.  - Continue lasix 80 mg po daily.  - Continue digoxin 0.125 daily.   - Continue spironolactone to 25 mg daily.  - Continue losartan 12.5 mg daily, no BP room to  increase.  - Start ivabradine 5 mg bid.  2. ID: Initial concern for RLL PNA on CT chest with septic shock. However, afebrile and WBCs only mildly elevated. Cultures remains negative. Afebrile  -  Now off abx.   3. Elevated LFTs: Suspect due to shock liver with hypotension.   - LFTs coming down.    4. H/o CVA: Residual left-sided weakness as well as some cognitive dysfunction per  notes.    - He continues on Plavix.  - Continue statin.   5. Hyponatremia: Sodium 130. Restrict free water.    6. PAD: Feet with possible ischemic changes.  Moderate RLE vascular disease by ABIs, left normal.  7. CAD: Severe 3VD on cath. Discussed with Dr. Ellyn Hack, plan for atherectomy/stenting left main into LAD, ramus, PLV (and ostial RCA depending on flow wire results) on Thursday.   Will need Impella support. - Continue ASA/Plavix.  - Continue atorvastatin.  8. Deconditioning: Will need SNF.   Length of Stay: 8  Loralie Champagne, MD  03/21/2019, 10:53 AM  Advanced Heart Failure Team Pager [phone] (M-F; 7a - 4p)  Please contact [facility] for night-coverage after hours (4p -7a ) and weekends on [email or web address]

## 2019-03-21 NOTE — Progress Notes (Signed)
PROGRESS NOTE    Matthew Mccormick  KGM:[id number] DOB: 03/26/1955 DOA: 03/13/2019 PCP: Kirstie Peri, MD   Brief Narrative: Patient is a 64 year old male with history of atrial fibrillation, diabetes type 2, hypertension, thombotic stroke who presented at [facility] hospital initially with several days of weakness, malaise, nausea, vomiting.  He was hypotensive on presentation.  He was suspected to have  cardiogenic shock and was transferred to [facility] for further management. Heart failure team is following.  He underwent cardiac cath on 03/19/2019 with finding of multivessel disease.  Planning for atherectomy /stent placement to left main on Thursday.  Assessment & Plan:   Active Problems:   Sepsis (HCC)   Pressure injury of skin   Ischemic hepatitis   Cardiogenic shock:  Cardiology team following.  On Lasix,spironolactone,digoxin,losartan,ivabradine.Plan to stop milrinone today. Prior echo in 2018 showed ejection fraction of 30 to 35%, current echo showing EF of 10 to 15% with severe right ventricular dysfunction. Initially started on norepinephrine, now off. Underwent right/left heart catheterization on 03/19/2019 with finding of multivessel disease.  Plan for atherectomy and stent placement to left main on Thursday with impella support,  He is a poor candidate for CABG.  Acute metabolic encephalopathy: Currently alert and oriented.  Patient lives by himself  in [place].  Acute hypoxemic respiratory failure: Present on admission.  Suspected to be from acute pulmonary edema from heart failure.  Chest x-ray did not show pneumonia.  CT chest was concerning for right lower lobe pneumonia but he is afebrile without leukocytosis.  Not on antibiotics.  Continue diuresis  Hypertension: Currently blood pressure soft.  Continuecurrent regimen  Diabetes mellitus 2: Continue sliding-scale insulin.  On insulin at home  History of CVA: Continue Plavix.  Has history of residual left-sided weakness with  some cognitive dysfunction. Statin stopped due to elevated liver enzymes,we will resume after further improvemement in liver enzymes.  Elevated liver enzymes: Secondary to combination of hepatic condition, shock liver.  Liver enzymes trending down.  Suspected peripheral artery disease: Has a bilateral small ulcerations on bilateral legs.Present on admission.ABI showed moderate right lower  extremity arterial disease .  Will recommend follow-up with vascular surgery as an outpatient.Conitnue plavix.  Will resume statin when liver enzymes further improve.  Hyponatremia: Secondary to hypervolemic hyponatremia from CHF.  Continue to monitor.  Sodium is 130 today.  Pressure Injury 03/13/19 Sacrum Stage 1 -  Intact skin with non-blanchable redness of a localized area usually over a bony prominence. (Active)  03/13/19 1700  Location: Sacrum  Location Orientation:   Staging: Stage 1 -  Intact skin with non-blanchable redness of a localized area usually over a bony prominence.  Wound Description (Comments):   Present on Admission: Yes              DVT prophylaxis:Lovenox Code Status: Full Family Communication: None present at the bedside Disposition Plan: Patient is from home.  PT/oT recommending SnF.  He is waiting for further procedure from cardiology.  Consultants: Cardiology  Procedures:None  Antimicrobials:  Anti-infectives (From admission, onward)   Start     Dose/Rate Route Frequency Ordered Stop   03/14/19 1200  cefTRIAXone (ROCEPHIN) 2 g in sodium chloride 0.9 % 100 mL IVPB  Status:  Discontinued     2 g 200 mL/hr over 30 Minutes Intravenous Every 24 hours 03/14/19 0951 03/15/19 0954   03/14/19 1100  azithromycin (ZITHROMAX) 500 mg in sodium chloride 0.9 % 250 mL IVPB  Status:  Discontinued  500 mg 250 mL/hr over 60 Minutes Intravenous Every 24 hours 03/14/19 0951 03/15/19 0954   03/14/19 0000  vancomycin (VANCOREADY) IVPB 1250 mg/250 mL  Status:  Discontinued     1,250  mg 166.7 mL/hr over 90 Minutes Intravenous Every 12 hours 03/13/19 1158 03/14/19 0951   03/13/19 2000  ceFEPIme (MAXIPIME) 2 g in sodium chloride 0.9 % 100 mL IVPB  Status:  Discontinued     2 g 200 mL/hr over 30 Minutes Intravenous Every 8 hours 03/13/19 1058 03/14/19 0951   03/13/19 1030  vancomycin (VANCOCIN) IVPB 1000 mg/200 mL premix  Status:  Discontinued     1,000 mg 200 mL/hr over 60 Minutes Intravenous Every 1 hr x 2 03/13/19 1001 03/13/19 1002   03/13/19 1030  vancomycin (VANCOREADY) IVPB 2000 mg/400 mL     2,000 mg 200 mL/hr over 120 Minutes Intravenous  Once 03/13/19 1002 03/13/19 1343   03/13/19 1000  ceFEPIme (MAXIPIME) 2 g in sodium chloride 0.9 % 100 mL IVPB     2 g 200 mL/hr over 30 Minutes Intravenous  Once 03/13/19 0952 03/13/19 1103   03/13/19 1000  metroNIDAZOLE (FLAGYL) IVPB 500 mg     500 mg 100 mL/hr over 60 Minutes Intravenous  Once 03/13/19 0952 03/13/19 1141   03/13/19 1000  vancomycin (VANCOCIN) IVPB 1000 mg/200 mL premix  Status:  Discontinued     1,000 mg 200 mL/hr over 60 Minutes Intravenous  Once 03/13/19 7619 03/13/19 1001      Subjective:  Patient seen and examined at the bedside this morning.  Hemodynamically stable but in sinus tachycardia.  Denies any chest pain or shortness of breath.  No new changes from yesterday..  Objective: Vitals:   03/21/19 0520 03/21/19 0610 03/21/19 0812 03/21/19 1155  BP:   100/77 (!) 82/62  Pulse:  (!) 111  (!) 113  Resp: 20 14    Temp:   98.6 F (37 C) 97.8 F (36.6 C)  TempSrc:   Oral Oral  SpO2:  100%  100%  Weight:      Height:        Intake/Output Summary (Last 24 hours) at 03/21/2019 1321 Last data filed at 03/21/2019 0900 Gross per 24 hour  Intake 240 ml  Output 1200 ml  Net -960 ml   Filed Weights   03/19/19 0614 03/20/19 0355 03/21/19 0430  Weight: 101.6 kg 100 kg 99.8 kg    Examination:  General exam: Generalized weakness, chronically ill looking Respiratory system: No wheezes or  crackles  Cardiovascular system: Sinus tachy. No  murmurs, rubs, gallops or clicks. Gastrointestinal system: Abdomen is nondistended, soft and nontender. No organomegaly or masses felt. Normal bowel sounds heard. Central nervous system: Alert and oriented. No focal neurological deficits. Extremities: No edema, no clubbing ,no cyanosis, distal peripheral pulses palpable. Skin: No rashes, lesions,no icterus ,no pallor, ulcers covered with dressings on bilateral lower extremities .    Data Reviewed: I have personally reviewed following labs and imaging studies  CBC: Recent Labs  Lab 03/16/19 0854 03/16/19 0854 03/17/19 0500 03/18/19 0509 03/19/19 0359 03/19/19 1245 03/20/19 0410  WBC 13.7*  --  12.3* 10.6* 11.7*  --  11.3*  HGB 14.7   < > 13.7 13.7 13.7 15.6 14.0  HCT 44.8   < > 40.8 41.5 40.8 46.0 42.2  MCV 93.7  --  91.7 92.6 91.3  --  92.1  PLT 160  --  182 158 172  --  191   < > =  values in this interval not displayed.   Basic Metabolic Panel: Recent Labs  Lab 03/15/19 0415 03/16/19 0419 03/17/19 0500 03/17/19 0500 03/18/19 0509 03/19/19 0359 03/19/19 1245 03/20/19 0410 03/21/19 0603  NA  --    < > 133*   < > 129* 127* 131* 130* 130*  K  --    < > 3.6   < > 3.5 4.0 3.7 3.6 4.3  CL  --    < > 97*  --  93* 85*  --  87* 92*  CO2  --    < > 28  --  29 29  --  31 29  GLUCOSE  --    < > 150*  --  155* 154*  --  167* 186*  BUN  --    < > 19  --  14 12  --  11 12  CREATININE  --    < > 0.68  --  0.65 0.68  --  0.62 0.65  CALCIUM  --    < > 7.9*  --  7.8* 8.2*  --  8.3* 8.3*  MG 2.1  --   --   --  1.7 1.8  --   --   --    < > = values in this interval not displayed.   GFR: Estimated Creatinine Clearance: 115.6 mL/min (by C-G formula based on SCr of 0.65 mg/dL). Liver Function Tests: Recent Labs  Lab 03/17/19 0500 03/18/19 0509 03/19/19 0359 03/20/19 0410 03/21/19 0603  AST 533* 480* 400* 203* 102*  ALT 1,080* 957* 722* 535* 372*  ALKPHOS 248* 231* 206* 173* 157*   BILITOT 2.3* 2.6* 3.0* 3.0* 2.2*  PROT 5.0* 5.4* 5.3* 5.4* 5.5*  ALBUMIN 2.3* 2.5* 2.4* 2.3* 2.4*   No results for input(s): LIPASE, AMYLASE in the last 168 hours. No results for input(s): AMMONIA in the last 168 hours. Coagulation Profile: Recent Labs  Lab 03/16/19 0419 03/17/19 0500  INR 2.6* 1.9*   Cardiac Enzymes: No results for input(s): CKTOTAL, CKMB, CKMBINDEX, TROPONINI in the last 168 hours. BNP (last 3 results) No results for input(s): PROBNP in the last 8760 hours. HbA1C: No results for input(s): HGBA1C in the last 72 hours. CBG: Recent Labs  Lab 03/20/19 2144 03/21/19 0048 03/21/19 0433 03/21/19 0806 03/21/19 1159  GLUCAP 235* 221* 168* 165* 216*   Lipid Profile: No results for input(s): CHOL, HDL, LDLCALC, TRIG, CHOLHDL, LDLDIRECT in the last 72 hours. Thyroid Function Tests: No results for input(s): TSH, T4TOTAL, FREET4, T3FREE, THYROIDAB in the last 72 hours. Anemia Panel: No results for input(s): VITAMINB12, FOLATE, FERRITIN, TIBC, IRON, RETICCTPCT in the last 72 hours. Sepsis Labs: Recent Labs  Lab 03/14/19 1546 03/15/19 0203 03/16/19 0756 03/16/19 1156  LATICACIDVEN 4.4* 3.2* 1.4 2.1*    Recent Results (from the past 240 hour(s))  Blood Culture (routine x 2)     Status: None   Collection Time: 03/13/19 10:15 AM   Specimen: BLOOD RIGHT FOREARM  Result Value Ref Range Status   Specimen Description BLOOD RIGHT FOREARM DRAWN BY RN BP  Final   Special Requests   Final    Blood Culture results may not be optimal due to an inadequate volume of blood received in culture bottles   Culture   Final    NO GROWTH 5 DAYS Performed at [facility], [address], [place], [place] [address]    Report Status 03/18/2019 FINAL  Final  Blood Culture (routine x 2)  Status: None   Collection Time: 03/13/19 10:20 AM   Specimen: BLOOD LEFT FOREARM  Result Value Ref Range Status   Specimen Description BLOOD LEFT FOREARM DRAWN BY RN BP  Final   Special  Requests   Final    BOTTLES DRAWN AEROBIC AND ANAEROBIC Blood Culture results may not be optimal due to an inadequate volume of blood received in culture bottles   Culture   Final    NO GROWTH 5 DAYS Performed at [facility], [address], [place], [place] [address]    Report Status 03/18/2019 FINAL  Final  Respiratory Panel by RT PCR (Flu A&B, Covid) - Nasopharyngeal Swab     Status: None   Collection Time: 03/13/19 10:58 AM   Specimen: Nasopharyngeal Swab  Result Value Ref Range Status   SARS Coronavirus 2 by RT PCR NEGATIVE NEGATIVE Final    Comment: (NOTE) SARS-CoV-2 target nucleic acids are NOT DETECTED. The SARS-CoV-2 RNA is generally detectable in upper respiratoy specimens during the acute phase of infection. The lowest concentration of SARS-CoV-2 viral copies this assay can detect is 131 copies/mL. A negative result does not preclude SARS-Cov-2 infection and should not be used as the sole basis for treatment or other patient management decisions. A negative result may occur with  improper specimen collection/handling, submission of specimen other than nasopharyngeal swab, presence of viral mutation(s) within the areas targeted by this assay, and inadequate number of viral copies (<131 copies/mL). A negative result must be combined with clinical observations, patient history, and epidemiological information. The expected result is Negative. Fact Sheet for Patients:  https://www.moore.com/ Fact Sheet for Healthcare Providers:  https://www.young.biz/ This test is not yet ap proved or cleared by the [place] FDA and  has been authorized for detection and/or diagnosis of SARS-CoV-2 by FDA under an Emergency Use Authorization (EUA). This EUA will remain  in effect (meaning this test can be used) for the duration of the COVID-19 declaration under Section 564(b)(1) of the Act, 21 U.S.C. section 360bbb-3(b)(1), unless the authorization  is terminated or revoked sooner.    Influenza A by PCR NEGATIVE NEGATIVE Final   Influenza B by PCR NEGATIVE NEGATIVE Final    Comment: (NOTE) The Xpert Xpress SARS-CoV-2/FLU/RSV assay is intended as an aid in  the diagnosis of influenza from Nasopharyngeal swab specimens and  should not be used as a sole basis for treatment. Nasal washings and  aspirates are unacceptable for Xpert Xpress SARS-CoV-2/FLU/RSV  testing. Fact Sheet for Patients: https://www.moore.com/ Fact Sheet for Healthcare Providers: https://www.young.biz/ This test is not yet approved or cleared by the [place] FDA and  has been authorized for detection and/or diagnosis of SARS-CoV-2 by  FDA under an Emergency Use Authorization (EUA). This EUA will remain  in effect (meaning this test can be used) for the duration of the  Covid-19 declaration under Section 564(b)(1) of the Act, 21  U.S.C. section 360bbb-3(b)(1), unless the authorization is  terminated or revoked. Performed at [facility], [address] [place], [place] [address]   MRSA PCR Screening     Status: None   Collection Time: 03/13/19  4:32 PM   Specimen: Nasal Mucosa; Nasopharyngeal  Result Value Ref Range Status   MRSA by PCR NEGATIVE NEGATIVE Final    Comment:        The GeneXpert MRSA Assay (FDA approved for NASAL specimens only), is one component of a comprehensive MRSA colonization surveillance program. It is not intended to diagnose MRSA infection nor to guide or  monitor treatment for MRSA infections. Performed at [facility], [address], [place], [place] [address]   Urine culture     Status: None   Collection Time: 03/14/19  6:00 AM   Specimen: In/Out Cath Urine  Result Value Ref Range Status   Specimen Description   Final    IN/OUT CATH URINE Performed at [facility], [address] [place], [place] [address]    Special Requests   Final    NONE Performed at [facility], [address], [place], [place] [address]    Culture   Final    NO GROWTH Performed at [facility], [address], [place], [place] [address]    Report Status 03/15/2019 FINAL  Final         Radiology Studies: No results found.      Scheduled Meds: . aspirin  81 mg Oral Daily  . atorvastatin  40 mg Oral q1800  . Chlorhexidine Gluconate Cloth  6 each Topical Daily  . clopidogrel  75 mg Oral Daily  . digoxin  0.125 mg Oral Daily  . enoxaparin (LOVENOX) injection  55 mg Subcutaneous Q24H  . furosemide  80 mg Oral Daily  . insulin aspart  0-15 Units Subcutaneous Q4H  . ivabradine  5 mg Oral BID WC  . losartan  12.5 mg Oral BID  . mouth rinse  15 mL Mouth Rinse BID  . potassium chloride  40 mEq Oral BID  . sodium chloride flush  3 mL Intravenous Q12H  . sodium chloride flush  3 mL Intravenous Q12H  . sodium chloride flush  3 mL Intravenous Q12H  . spironolactone  25 mg Oral Daily   Continuous Infusions: . sodium chloride    . sodium chloride       LOS: 8 days    Time spent: 35 mins. More than 50% of that time was spent in counseling and/or coordination of care.      Burnadette Pop, MD [facility] P2/11/2019, 1:21 PM

## 2019-03-21 NOTE — Progress Notes (Addendum)
1716: BP 74/57. Pt more lethargic, pulled his IV out. Pt became more lethargic than before.   RN notified Renford Dills, MD and Allyson Sabal, MD-Cardiolgy on call,  called RRT.  Received verbal order for a bolus of 250cc NS from Renford Dills, MD.  Received verbal order for Co-ox from Allyson Sabal, MD.  Pt to be transferred to [facility] for levophed per Allyson Sabal, MD.

## 2019-03-21 NOTE — Progress Notes (Signed)
Occupational Therapy Treatment Patient Details Name: Matthew Mccormick MRN: [id number] DOB: 02/21/1955 Today's Date: 03/21/2019    History of present illness Pt is 64 y/o M presents to the ER today after having had a fall at home.  He was trying to go to the bathroom and fell between his bed and dresser. Work up showed sepsis, R lower lobe PNA, peripheral cyanosis with increased INR, and elevated troponins with abnormal ECGs. PMH includes CKD, DM2, HTN, and stroke.   OT comments  Patient continues to make minimal progress towards goals in skilled OT session. Patient's session encompassed bed mobility and pericare secondary to low blood pressure levels. Nurse present in room upon arrival, and therapist assisted with pericare where pt required max A of 2 for rolling and bed mobility. BP level 82/62 upon arrival, assessed in supine 2x, with numbers of 75/59 and 75/53. As nursing was present, RN made note to notify doctor, and patient was not brought into sitting to further participate in therapy. Pt set up with lunch at the end of the session, with needs met. Will continue to follow acutely.    Follow Up Recommendations  SNF;Supervision/Assistance - 24 hour    Equipment Recommendations  None recommended by OT    Recommendations for Other Services      Precautions / Restrictions Precautions Precautions: Fall Restrictions Weight Bearing Restrictions: No       Mobility Bed Mobility Overal bed mobility: Needs Assistance Bed Mobility: Rolling Rolling: Max assist;+2 for physical assistance         General bed mobility comments: maxA +2 for safety  Transfers                      Balance                                           ADL either performed or assessed with clinical judgement   ADL Overall ADL's : Needs assistance/impaired Eating/Feeding: Set up;Sitting                                           Vision       Perception      Praxis      Cognition Arousal/Alertness: Awake/alert Behavior During Therapy: WFL for tasks assessed/performed Overall Cognitive Status: No family/caregiver present to determine baseline cognitive functioning                                 General Comments: Increased processing time, [facility]        Exercises     Shoulder Instructions       General Comments      Pertinent Vitals/ Pain       Pain Assessment: Faces Faces Pain Scale: Hurts a little bit Pain Descriptors / Indicators: Guarding;Grimacing;Discomfort Pain Intervention(s): Limited activity within patient's tolerance;Monitored during session;Repositioned  Home Living                                          Prior Functioning/Environment              Frequency  Min 2X/week        Progress Toward Goals  OT Goals(current goals can now be found in the care plan section)  Progress towards OT goals: Progressing toward goals  Acute Rehab OT Goals Patient Stated Goal: return home after rehab OT Goal Formulation: With patient Time For Goal Achievement: 03/30/19 Potential to Achieve Goals: Good  Plan Discharge plan remains appropriate    Co-evaluation                 AM-PAC OT "6 Clicks" Daily Activity     Outcome Measure   Help from another person eating meals?: A Little Help from another person taking care of personal grooming?: A Little Help from another person toileting, which includes using toliet, bedpan, or urinal?: A Lot Help from another person bathing (including washing, rinsing, drying)?: A Lot Help from another person to put on and taking off regular upper body clothing?: A Lot Help from another person to put on and taking off regular lower body clothing?: A Lot 6 Click Score: 14    End of Session    OT Visit Diagnosis: Unsteadiness on feet (R26.81);Other abnormalities of gait and mobility (R26.89);Muscle weakness (generalized) (M62.81);Other symptoms  and signs involving cognitive function   Activity Tolerance Other (comment)(Pt limited by BP)   Patient Left in bed;with call bell/phone within reach;with bed alarm set;with nursing/sitter in room   Nurse Communication          Time: 1320-1330 OT Time Calculation (min): 10 min  Charges: OT General Charges $OT Visit: 1 Visit OT Treatments $Self Care/Home Management : 8-22 mins  Corinne Ports E. Namon Villarin, COTA/L Acute Rehabilitation Services [place] 03/21/2019, 2:55 PM

## 2019-03-21 NOTE — Significant Event (Signed)
Rapid Response Event Note  Overview: Time Called: 1357 Arrival Time: 1400 Event Type: Hypotension  Initial Focused Assessment: The patient is fatigued and weak. BP 75/53  ST 102  RR 20  O2 sat 100% Lung sounds clear Heart tones regular Warm and clammy CVP 8  [place] PA at bedside  Interventions: 12 lead EKG done 250 cc NS bolus  Plan of Care (if not transferred): RN to call if patient remains hypotensive   Event Summary: Name of Physician Notified: [place] PA with cardiology at 1355    at       Event End Time: 1440  Marcellina Millin

## 2019-03-21 NOTE — Significant Event (Signed)
I was notified that patient became hypotensive and lethargic this evening. I have added 250 ml NS bolus. I will check CBC,BMP and ABG( r/o hypercarbia). If BP doesn't improve,we might need to consider restarting dobutamine.CHF team aware. We will continue to monitor closely.

## 2019-03-22 ENCOUNTER — Encounter (HOSPITAL_COMMUNITY)
Admission: EM | Disposition: A | Discharge status: Skilled Nursing Facility | Payer: Self-pay | Source: Home / Self Care | Attending: Internal Medicine

## 2019-03-22 DIAGNOSIS — I2510 Atherosclerotic heart disease of native coronary artery without angina pectoris: Secondary | ICD-10-CM

## 2019-03-22 HISTORY — PX: CORONARY ULTRASOUND/IVUS: CATH118244

## 2019-03-22 HISTORY — PX: VENTRICULAR ASSIST DEVICE INSERTION: CATH118273

## 2019-03-22 HISTORY — PX: CORONARY ATHERECTOMY: CATH118238

## 2019-03-22 LAB — POCT I-STAT 7, (LYTES, BLD GAS, ICA,H+H)
Acid-Base Excess: 4 mmol/L — ABNORMAL HIGH (ref 0.0–2.0)
Bicarbonate: 29.1 mmol/L — ABNORMAL HIGH (ref 20.0–28.0)
Calcium, Ion: 1.11 mmol/L — ABNORMAL LOW (ref 1.15–1.40)
HCT: 44 % (ref 39.0–52.0)
Hemoglobin: 15 g/dL (ref 13.0–17.0)
O2 Saturation: 100 %
Potassium: 5.1 mmol/L (ref 3.5–5.1)
Sodium: 127 mmol/L — ABNORMAL LOW (ref 135–145)
TCO2: 30 mmol/L (ref 22–32)
pCO2 arterial: 45.3 mmHg (ref 32.0–48.0)
pH, Arterial: 7.416 (ref 7.350–7.450)
pO2, Arterial: 215 mmHg — ABNORMAL HIGH (ref 83.0–108.0)

## 2019-03-22 LAB — COOXEMETRY PANEL
Carboxyhemoglobin: 1.5 % (ref 0.5–1.5)
Carboxyhemoglobin: 1.5 % (ref 0.5–1.5)
Methemoglobin: 0.9 % (ref 0.0–1.5)
Methemoglobin: 1 % (ref 0.0–1.5)
O2 Saturation: 51.3 %
O2 Saturation: 57 %
Total hemoglobin: 14.5 g/dL (ref 12.0–16.0)
Total hemoglobin: 15 g/dL (ref 12.0–16.0)

## 2019-03-22 LAB — CREATININE, SERUM
Creatinine, Ser: 0.76 mg/dL (ref 0.61–1.24)
GFR calc Af Amer: >60 mL/min (ref >60)
GFR calc non Af Amer: >60 mL/min (ref >60)

## 2019-03-22 LAB — BASIC METABOLIC PANEL
Anion gap: 15 (ref 5–15)
BUN: 17 mg/dL (ref 8–23)
CO2: 28 mmol/L (ref 22–32)
Calcium: 8.8 mg/dL — ABNORMAL LOW (ref 8.9–10.3)
Chloride: 87 mmol/L — ABNORMAL LOW (ref 98–111)
Creatinine, Ser: 0.74 mg/dL (ref 0.61–1.24)
GFR calc Af Amer: >60 mL/min (ref >60)
GFR calc non Af Amer: >60 mL/min (ref >60)
Glucose, Bld: 160 mg/dL — ABNORMAL HIGH (ref 70–99)
Potassium: 4.8 mmol/L (ref 3.5–5.1)
Sodium: 130 mmol/L — ABNORMAL LOW (ref 135–145)

## 2019-03-22 LAB — CBC
HCT: 41.7 % (ref 39.0–52.0)
Hemoglobin: 13.5 g/dL (ref 13.0–17.0)
MCH: 30.4 pg (ref 26.0–34.0)
MCHC: 32.4 g/dL (ref 30.0–36.0)
MCV: 93.9 fL (ref 80.0–100.0)
Platelets: 198 10*3/uL (ref 150–400)
RBC: 4.44 MIL/uL (ref 4.22–5.81)
RDW: 14.2 % (ref 11.5–15.5)
WBC: 12.1 10*3/uL — ABNORMAL HIGH (ref 4.0–10.5)
nRBC: 0 % (ref 0.0–0.2)

## 2019-03-22 LAB — PROTIME-INR
INR: 1.3 — ABNORMAL HIGH (ref 0.8–1.2)
Prothrombin Time: 15.9 seconds — ABNORMAL HIGH (ref 11.4–15.2)

## 2019-03-22 LAB — POCT ACTIVATED CLOTTING TIME
Activated Clotting Time: 290 seconds
Activated Clotting Time: 610 seconds
Activated Clotting Time: 334 seconds
Activated Clotting Time: 411 seconds
Activated Clotting Time: 329 seconds
Activated Clotting Time: 191 s
Activated Clotting Time: 109 seconds

## 2019-03-22 LAB — GLUCOSE, CAPILLARY
Glucose-Capillary: 145 mg/dL — ABNORMAL HIGH (ref 70–99)
Glucose-Capillary: 152 mg/dL — ABNORMAL HIGH (ref 70–99)
Glucose-Capillary: 168 mg/dL — ABNORMAL HIGH (ref 70–99)
Glucose-Capillary: 252 mg/dL — ABNORMAL HIGH (ref 70–99)
Glucose-Capillary: 274 mg/dL — ABNORMAL HIGH (ref 70–99)

## 2019-03-22 LAB — DIGOXIN LEVEL: Digoxin Level: 0.4 ng/mL — ABNORMAL LOW (ref 0.8–2.0)

## 2019-03-22 LAB — ABO/RH: ABO/RH(D): A POS

## 2019-03-22 LAB — PREPARE RBC (CROSSMATCH)

## 2019-03-22 SURGERY — CORONARY ATHERECTOMY
Anesthesia: LOCAL

## 2019-03-22 MED ORDER — SODIUM CHLORIDE 0.9% IV SOLUTION: Freq: Once | INTRAVENOUS | Status: DC

## 2019-03-22 MED ORDER — IOHEXOL 350 MG/ML SOLN
INTRAVENOUS | Status: DC | PRN
  Administered 2019-03-22: 210 mL

## 2019-03-22 MED ORDER — HEPARIN SODIUM (PORCINE) 1000 UNIT/ML IJ SOLN
INTRAMUSCULAR | Status: AC
  Filled 2019-03-22: qty 1

## 2019-03-22 MED ORDER — LIDOCAINE HCL (PF) 1 % IJ SOLN
INTRAMUSCULAR | Status: AC
  Filled 2019-03-22: qty 30

## 2019-03-22 MED ORDER — SODIUM CHLORIDE 0.9 % IV SOLN
INTRAVENOUS | Status: AC | PRN
  Administered 2019-03-22: 10 mL/h via INTRAVENOUS

## 2019-03-22 MED ORDER — SODIUM CHLORIDE 0.9% FLUSH
3.0000 mL | Freq: Two times a day (BID) | INTRAVENOUS | Status: DC
  Administered 2019-03-22 – 2019-03-30 (×13): 3 mL via INTRAVENOUS

## 2019-03-22 MED ORDER — FENTANYL CITRATE (PF) 100 MCG/2ML IJ SOLN
INTRAMUSCULAR | Status: AC
  Filled 2019-03-22: qty 2

## 2019-03-22 MED ORDER — HEPARIN (PORCINE) IN NACL 1000-0.9 UT/500ML-% IV SOLN
INTRAVENOUS | Status: AC
  Filled 2019-03-22: qty 500

## 2019-03-22 MED ORDER — IOHEXOL 350 MG/ML SOLN
INTRAVENOUS | Status: AC
  Filled 2019-03-22: qty 1

## 2019-03-22 MED ORDER — NITROGLYCERIN 1 MG/10 ML FOR IR/CATH LAB
INTRA_ARTERIAL | Status: AC
  Filled 2019-03-22: qty 10

## 2019-03-22 MED ORDER — LIDOCAINE HCL (PF) 1 % IJ SOLN
INTRAMUSCULAR | Status: DC | PRN
  Administered 2019-03-22 (×3): 15 mL

## 2019-03-22 MED ORDER — HEPARIN (PORCINE) IN NACL 1000-0.9 UT/500ML-% IV SOLN
INTRAVENOUS | Status: DC | PRN
  Administered 2019-03-22 (×4): 500 mL

## 2019-03-22 MED ORDER — HEPARIN (PORCINE) IN NACL 1000-0.9 UT/500ML-% IV SOLN
INTRAVENOUS | Status: AC
  Filled 2019-03-22: qty 1000

## 2019-03-22 MED ORDER — MIDAZOLAM HCL 2 MG/2ML IJ SOLN
INTRAMUSCULAR | Status: DC | PRN
  Administered 2019-03-22: 1 mg via INTRAVENOUS

## 2019-03-22 MED ORDER — HEPARIN SODIUM (PORCINE) 1000 UNIT/ML IJ SOLN
INTRAMUSCULAR | Status: DC | PRN
  Administered 2019-03-22: 9000 [IU] via INTRAVENOUS
  Administered 2019-03-22: 3000 [IU] via INTRAVENOUS

## 2019-03-22 MED ORDER — MIDAZOLAM HCL 2 MG/2ML IJ SOLN
INTRAMUSCULAR | Status: AC
  Filled 2019-03-22: qty 2

## 2019-03-22 MED ORDER — FENTANYL CITRATE (PF) 100 MCG/2ML IJ SOLN
INTRAMUSCULAR | Status: DC | PRN
  Administered 2019-03-22 (×3): 25 ug via INTRAVENOUS
  Administered 2019-03-22: 50 ug via INTRAVENOUS
  Administered 2019-03-22: 25 ug via INTRAVENOUS

## 2019-03-22 MED ORDER — SODIUM CHLORIDE 0.9 % IV SOLN
250.0000 mL | INTRAVENOUS | Status: DC | PRN
  Administered 2019-03-22: 250 mL via INTRAVENOUS

## 2019-03-22 MED ORDER — SODIUM CHLORIDE 0.9% FLUSH: 3.0000 mL | INTRAVENOUS | Status: DC | PRN

## 2019-03-22 MED ORDER — SODIUM CHLORIDE 0.9 % IV SOLN: INTRAVENOUS | Status: AC

## 2019-03-22 MED ORDER — ATROPINE SULFATE 1 MG/10ML IJ SOSY
PREFILLED_SYRINGE | INTRAMUSCULAR | Status: AC
  Filled 2019-03-22: qty 10

## 2019-03-22 SURGICAL SUPPLY — 44 items
BALLN SAPPHIRE 1.0X8 (BALLOONS) ×2
BALLN SAPPHIRE 1.5X15 (BALLOONS) ×2
BALLN SAPPHIRE 2.0X20 (BALLOONS) ×2
BALLN SAPPHIRE 2.5X20 (BALLOONS) ×2
BALLN SAPPHIRE ~~LOC~~ 3.25X15 (BALLOONS) ×1 IMPLANT
BALLN SAPPHIRE ~~LOC~~ 4.0X8 (BALLOONS) ×1 IMPLANT
BALLOON SAPPHIRE 1.0X8 (BALLOONS) IMPLANT
BALLOON SAPPHIRE 1.5X15 (BALLOONS) IMPLANT
BALLOON SAPPHIRE 2.0X20 (BALLOONS) IMPLANT
BALLOON SAPPHIRE 2.5X20 (BALLOONS) IMPLANT
CATH INFINITI 5FR ANG PIGTAIL (CATHETERS) ×1 IMPLANT
CATH OPTICROSS 40MHZ (CATHETERS) ×1 IMPLANT
CATH SWAN GANZ VIP 7.5F (CATHETERS) ×1 IMPLANT
CATH TELEPORT (CATHETERS) ×1 IMPLANT
CATH VISTA GUIDE 6FR XBLAD3.5 (CATHETERS) ×1 IMPLANT
CATH VISTA GUIDE 7FRXB LAD 3.5 (CATHETERS) ×1 IMPLANT
CROWN DIAMONDBACK CLASSIC 1.25 (BURR) ×1 IMPLANT
ELECT DEFIB PAD ADLT CADENCE (PAD) ×1 IMPLANT
FEM STOP ARCH (HEMOSTASIS) ×1
HOVERMATT SINGLE USE (MISCELLANEOUS) ×1 IMPLANT
KIT ENCORE 26 ADVANTAGE (KITS) ×2 IMPLANT
KIT HEART LEFT (KITS) ×2 IMPLANT
KIT MICROPUNCTURE NIT STIFF (SHEATH) ×1 IMPLANT
LUBRICANT VIPERSLIDE CORONARY (MISCELLANEOUS) ×1 IMPLANT
PACK CARDIAC CATHETERIZATION (CUSTOM PROCEDURE TRAY) ×2 IMPLANT
SET IMPELLA CP PUMP (CATHETERS) ×1 IMPLANT
SHEATH HYDRO PINNACLE 7FR 45 (SHEATH) ×1 IMPLANT
SHEATH PINNACLE 6F 10CM (SHEATH) ×3 IMPLANT
SHEATH PINNACLE 7F 10CM (SHEATH) IMPLANT
SHEATH PINNACLE 8F 10CM (SHEATH) ×1 IMPLANT
SHEATH PROBE COVER 6X72 (BAG) ×1 IMPLANT
SLED PULL BACK IVUS (MISCELLANEOUS) ×1 IMPLANT
SLEEVE REPOSITIONING LENGTH 30 (MISCELLANEOUS) ×1 IMPLANT
STENT SYNERGY XD 3.0X38 (Permanent Stent) IMPLANT
SYNERGY XD 3.0X38 (Permanent Stent) ×2 IMPLANT
SYSTEM COMPRESSION FEMOSTOP (HEMOSTASIS) IMPLANT
TRANSDUCER W/STOPCOCK (MISCELLANEOUS) ×2 IMPLANT
TUBING CIL FLEX 10 FLL-RA (TUBING) ×2 IMPLANT
WIRE ASAHI PROWATER 180CM (WIRE) ×1 IMPLANT
WIRE EMERALD 3MM-J .025X260CM (WIRE) ×1 IMPLANT
WIRE EMERALD 3MM-J .035X150CM (WIRE) ×1 IMPLANT
WIRE PT2 MS 300CM (WIRE) ×1 IMPLANT
WIRE RUNTHROUGH .014X180CM (WIRE) ×1 IMPLANT
WIRE VIPERWIRE COR FLEX .012 (WIRE) ×1 IMPLANT

## 2019-03-22 NOTE — Progress Notes (Signed)
Removed femstop device from lt groin site.  A fair bit of bruising but area feels soft with no noticeable hematoma.  Dressed site with tegaderm and 4x4 gauze.  RN has been getting doppler pulses in the feet.  Hemodynamics stable with systolic BP's in the 115 range.  Updated Dr. Herbie [place].

## 2019-03-22 NOTE — Interval H&P Note (Signed)
History and Physical Interval Note:  03/22/2019 7:25 AM  Matthew Mccormick  has presented today for surgery, with the diagnosis of Severe MultivesselCoronary Artery Disease, Ischemic Cardiomyopathy, Cardiogenic Shock.  The various methods of treatment have been discussed with the patient and family. After consideration of risks, benefits and other options for treatment, the patient has consented to  Procedure(s): CORONARY ATHERECTOMY (N/A) VENTRICULAR ASSIST DEVICE INSERTION (N/A)  CORONARY STENT INTERVENTION  as a surgical intervention.  The patient's history has been reviewed, patient examined, no change in status, stable for surgery.  I have reviewed the patient's chart and labs.  Questions were answered to the patient's satisfaction.    I met with the patient this morning myself explained to him the high risk nature of this procedure citing significant risk of potential periprocedural death or MI based on trifurcation lesion and his severe EF reduction.  Based on the fact that he remains borderline hypotensive and tachycardic, intolerant of pressure medications.  After discussing with Dr. Aundra Dubin, we will plan to leave the Impella in overnight to allow him to continue to rest post PCI.  This will allow [place] to consider evaluating the RCA while still protected.  Matthew Mccormick has a trifurcation lesion that is mostly distal left main-LAD [place] class (1, 1, 0) with significant disease in the proximal ramus intermedius that will also be addressed. He does have considerable ostial RCA disease as well as distal RCA/PAV-RPL disease that would likely be better treated on a later day.   Matthew Mccormick

## 2019-03-22 NOTE — Progress Notes (Signed)
PT Cancellation Note  Patient Details Name: Matthew Mccormick MRN: [id number] DOB: [birth year]   Cancelled Treatment:    Reason Eval/Treat Not Completed: Active bedrest order. Pt with bed rest order in at this time after Impella placement today. PT will follows up when off bed rest as time allows.   Arlyss Gandy 03/22/2019, 4:40 PM

## 2019-03-22 NOTE — Progress Notes (Signed)
Dr. Okey Dupre (Cardiology) called and notified of patients Cooxemetry panel  being 47.6. Orders to get a repeat draw. Repeat value of 51.3 called in to Dr. Okey Dupre. No new orders at this time. Will continue to monitor.

## 2019-03-22 NOTE — Progress Notes (Addendum)
PROGRESS NOTE    Matthew Mccormick  SJG:[id number] DOB: Jul 07, 1955 DOA: 03/13/2019 PCP: Monico Blitz, MD   Brief Narrative: Patient is a 64 year old male with history of atrial fibrillation, diabetes type 2, hypertension, thombotic stroke who presented at [facility] hospital initially with several days of weakness, malaise, nausea, vomiting.  He was hypotensive on presentation.  He was suspected to have  cardiogenic shock and was transferred to [facility] for further management. Heart failure team is following.  He underwent cardiac cath on 03/19/2019 with finding of multivessel disease.   Underwent atherectomy based PCI of left main-proximal LAD with placement of DES, balloon angioplasty of ostial and proximal ramus intermedius on 03/22/19.  Assessment & Plan:   Active Problems:   Sepsis ([place])   Pressure injury of skin   Ischemic hepatitis   Cardiogenic shock:  Cardiology team following.  Started on Lasix,spironolactone,digoxin,losartan,ivabradine.Some of thes ehavd to be held due to hypotension. Prior echo in 2018 showed ejection fraction of 30 to 35%, current echo showing EF of 10 to 15% with severe right ventricular dysfunction. Underwent right/left heart catheterization on 03/19/2019 with finding of multivessel disease. He is a poor candidate for CABG. Underwent atherectomy based PCI of left main-proximal LAD with placement of DES, balloon angioplasty of ostial and proximal ramus intermedius on 03/22/19.  Acute metabolic encephalopathy: Currently alert and oriented.  Patient lives by himself  in [place].  Acute hypoxemic respiratory failure: Present on admission.  Suspected to be from acute pulmonary edema from heart failure.  Chest x-ray did not show pneumonia.  CT chest was concerning for right lower lobe pneumonia but he is afebrile without leukocytosis.  Not on antibiotics.    Hypertension: Currently blood pressure soft.  Continuecurrent regimen  Diabetes mellitus 2: Continue sliding-scale  insulin.  On insulin at home  History of CVA: Continue Plavix.  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 On statin  Elevated liver enzymes: Secondary to combination of hepatic condition, shock liver.  Liver enzymes trending down.  Suspected peripheral artery disease: Has a bilateral small ulcerations on bilateral legs.Present on admission.ABI showed moderate right lower  extremity arterial disease .  Will recommend follow-up with vascular surgery as an outpatient.Conitnue plavix.  On statin.  Hyponatremia: Secondary to hypervolemic hyponatremia from CHF.  Continue to monitor.  Sodium is 130 today.  Pressure Injury 03/13/19 Sacrum Stage 1 -  Intact skin with non-blanchable redness of a localized area usually over a bony prominence. (Active)  03/13/19 1700  Location: Sacrum  Location Orientation:   Staging: Stage 1 -  Intact skin with non-blanchable redness of a localized area usually over a bony prominence.  Wound Description (Comments):   Present on Admission: Yes              DVT prophylaxis:Lovenox Code Status: Full Family Communication: None present at the bedside Disposition Plan: Patient is from home.  PT/oT recommending SnF.  He is not clinically ready for discharge and cardiology hasnt cleared for dc.  Consultants: Cardiology  Procedures:None  Antimicrobials:  Anti-infectives (From admission, onward)   Start     Dose/Rate Route Frequency Ordered Stop   03/14/19 1200  cefTRIAXone (ROCEPHIN) 2 g in sodium chloride 0.9 % 100 mL IVPB  Status:  Discontinued     2 g 200 mL/hr over 30 Minutes Intravenous Every 24 hours 03/14/19 0951 03/15/19 0954   03/14/19 1100  azithromycin (ZITHROMAX) 500 mg in sodium chloride 0.9 % 250 mL IVPB  Status:  Discontinued  500 mg 250 mL/hr over 60 Minutes Intravenous Every 24 hours 03/14/19 0951 03/15/19 0954   03/14/19 0000  vancomycin (VANCOREADY) IVPB 1250 mg/250 mL  Status:  Discontinued     1,250  mg 166.7 mL/hr over 90 Minutes Intravenous Every 12 hours 03/13/19 1158 03/14/19 0951   03/13/19 2000  ceFEPIme (MAXIPIME) 2 g in sodium chloride 0.9 % 100 mL IVPB  Status:  Discontinued     2 g 200 mL/hr over 30 Minutes Intravenous Every 8 hours 03/13/19 1058 03/14/19 0951   03/13/19 1030  vancomycin (VANCOCIN) IVPB 1000 mg/200 mL premix  Status:  Discontinued     1,000 mg 200 mL/hr over 60 Minutes Intravenous Every 1 hr x 2 03/13/19 1001 03/13/19 1002   03/13/19 1030  vancomycin (VANCOREADY) IVPB 2000 mg/400 mL     2,000 mg 200 mL/hr over 120 Minutes Intravenous  Once 03/13/19 1002 03/13/19 1343   03/13/19 1000  ceFEPIme (MAXIPIME) 2 g in sodium chloride 0.9 % 100 mL IVPB     2 g 200 mL/hr over 30 Minutes Intravenous  Once 03/13/19 0952 03/13/19 1103   03/13/19 1000  metroNIDAZOLE (FLAGYL) IVPB 500 mg     500 mg 100 mL/hr over 60 Minutes Intravenous  Once 03/13/19 0952 03/13/19 1141   03/13/19 1000  vancomycin (VANCOCIN) IVPB 1000 mg/200 mL premix  Status:  Discontinued     1,000 mg 200 mL/hr over 60 Minutes Intravenous  Once 03/13/19 9562 03/13/19 1001      Subjective:  Patient seen and examined at the bedside this afternoon. He just came from cath. He became hypotensive yesterday afternoon and was on levophed briefly.During my evaluation he was drowsy but hemodynamically stable  Objective: Vitals:   03/22/19 1222 03/22/19 1227 03/22/19 1232 03/22/19 1345  BP: 107/63 116/76 110/84 127/69  Pulse: (!) 113 (!) 113 (!) 0 (!) 104  Resp: 16 (!) 8 (!) 4 14  Temp:    (!) 97.5 F (36.4 C)  TempSrc:    Oral  SpO2: 97% 98% (!) 0%   Weight:      Height:        Intake/Output Summary (Last 24 hours) at 03/22/2019 1403 Last data filed at 03/22/2019 0400 Gross per 24 hour  Intake 240 ml  Output 500 ml  Net -260 ml   Filed Weights   03/20/19 0355 03/21/19 0430 03/22/19 0500  Weight: 100 kg 99.8 kg 102.4 kg    Examination:  General exam: sleepy,drowsy Respiratory system: No  wheezes or crackles  Cardiovascular system: regular rate and rhythm. No  murmurs, rubs, gallops or clicks. Gastrointestinal system: Abdomen is nondistended, soft and nontender. No organomegaly or masses felt. Normal bowel sounds heard. Extremities: No edema, no clubbing ,no cyanosis, distal peripheral pulses palpable. Skin: No rashes, lesions,no icterus ,no pallor, ulcers covered with dressings on bilateral lower extremities .    Data Reviewed: I have personally reviewed following labs and imaging studies  CBC: Recent Labs  Lab 03/18/19 0509 03/18/19 0509 03/19/19 0359 03/19/19 1245 03/20/19 0410 03/21/19 1522 03/21/19 1755  WBC 10.6*  --  11.7*  --  11.3* 10.8* 10.6*  NEUTROABS  --   --   --   --   --   --  7.9*  HGB 13.7   < > 13.7 15.6 14.0 13.9 13.9  HCT 41.5   < > 40.8 46.0 42.2 42.1 42.8  MCV 92.6  --  91.3  --  92.1 94.0 94.5  PLT 158  --  172  --  191 203 202   < > = values in this interval not displayed.   Basic Metabolic Panel: Recent Labs  Lab 03/18/19 0509 03/18/19 0509 03/19/19 0359 03/19/19 0359 03/19/19 1245 03/20/19 0410 03/21/19 0603 03/21/19 1816 03/22/19 0500  NA 129*   < > 127*   < > 131* 130* 130* 129* 130*  K 3.5   < > 4.0   < > 3.7 3.6 4.3 4.5 4.8  CL 93*   < > 85*  --   --  87* 92* 90* 87*  CO2 29   < > 29  --   --  31 29 27 28   GLUCOSE 155*   < > 154*  --   --  167* 186* 163* 160*  BUN 14   < > 12  --   --  11 12 13 17   CREATININE 0.65   < > 0.68  --   --  0.62 0.65 0.64 0.74  CALCIUM 7.8*   < > 8.2*  --   --  8.3* 8.3* 8.4* 8.8*  MG 1.7  --  1.8  --   --   --   --   --   --    < > = values in this interval not displayed.   GFR: Estimated Creatinine Clearance: 117 mL/min (by C-G formula based on SCr of 0.74 mg/dL). Liver Function Tests: Recent Labs  Lab 03/17/19 0500 03/18/19 0509 03/19/19 0359 03/20/19 0410 03/21/19 0603  AST 533* 480* 400* 203* 102*  ALT 1,080* 957* 722* 535* 372*  ALKPHOS 248* 231* 206* 173* 157*  BILITOT  2.3* 2.6* 3.0* 3.0* 2.2*  PROT 5.0* 5.4* 5.3* 5.4* 5.5*  ALBUMIN 2.3* 2.5* 2.4* 2.3* 2.4*   No results for input(s): LIPASE, AMYLASE in the last 168 hours. No results for input(s): AMMONIA in the last 168 hours. Coagulation Profile: Recent Labs  Lab 03/16/19 0419 03/17/19 0500 03/22/19 0500  INR 2.6* 1.9* 1.3*   Cardiac Enzymes: No results for input(s): CKTOTAL, CKMB, CKMBINDEX, TROPONINI in the last 168 hours. BNP (last 3 results) No results for input(s): PROBNP in the last 8760 hours. HbA1C: No results for input(s): HGBA1C in the last 72 hours. CBG: Recent Labs  Lab 03/21/19 1159 03/21/19 1654 03/21/19 1949 03/22/19 0034 03/22/19 0445  GLUCAP 216* 182* 81 145* 152*   Lipid Profile: No results for input(s): CHOL, HDL, LDLCALC, TRIG, CHOLHDL, LDLDIRECT in the last 72 hours. Thyroid Function Tests: No results for input(s): TSH, T4TOTAL, FREET4, T3FREE, THYROIDAB in the last 72 hours. Anemia Panel: No results for input(s): VITAMINB12, FOLATE, FERRITIN, TIBC, IRON, RETICCTPCT in the last 72 hours. Sepsis Labs: Recent Labs  Lab 03/16/19 0756 03/16/19 1156  LATICACIDVEN 1.4 2.1*    Recent Results (from the past 240 hour(s))  Blood Culture (routine x 2)     Status: None   Collection Time: 03/13/19 10:15 AM   Specimen: BLOOD RIGHT FOREARM  Result Value Ref Range Status   Specimen Description BLOOD RIGHT FOREARM DRAWN BY RN BP  Final   Special Requests   Final    Blood Culture results may not be optimal due to an inadequate volume of blood received in culture bottles   Culture   Final    NO GROWTH 5 DAYS Performed at [facility], [address], [place], [place] [address]    Report Status 03/18/2019 FINAL  Final  Blood Culture (routine x 2)     Status: None  Collection Time: 03/13/19 10:20 AM   Specimen: BLOOD LEFT FOREARM  Result Value Ref Range Status   Specimen Description BLOOD LEFT FOREARM DRAWN BY RN BP  Final   Special Requests   Final    BOTTLES DRAWN  AEROBIC AND ANAEROBIC Blood Culture results may not be optimal due to an inadequate volume of blood received in culture bottles   Culture   Final    NO GROWTH 5 DAYS Performed at [facility], [address], [place], [place] [address]    Report Status 03/18/2019 FINAL  Final  Respiratory Panel by RT PCR (Flu A&B, Covid) - Nasopharyngeal Swab     Status: None   Collection Time: 03/13/19 10:58 AM   Specimen: Nasopharyngeal Swab  Result Value Ref Range Status   SARS Coronavirus 2 by RT PCR NEGATIVE NEGATIVE Final    Comment: (NOTE) SARS-CoV-2 target nucleic acids are NOT DETECTED. The SARS-CoV-2 RNA is generally detectable in upper respiratoy specimens during the acute phase of infection. The lowest concentration of SARS-CoV-2 viral copies this assay can detect is 131 copies/mL. A negative result does not preclude SARS-Cov-2 infection and should not be used as the sole basis for treatment or other patient management decisions. A negative result may occur with  improper specimen collection/handling, submission of specimen other than nasopharyngeal swab, presence of viral mutation(s) within the areas targeted by this assay, and inadequate number of viral copies (<131 copies/mL). A negative result must be combined with clinical observations, patient history, and epidemiological information. The expected result is Negative. Fact Sheet for Patients:  https://www.moore.com/ Fact Sheet for Healthcare Providers:  https://www.young.biz/ This test is not yet ap proved or cleared by the [place] FDA and  has been authorized for detection and/or diagnosis of SARS-CoV-2 by FDA under an Emergency Use Authorization (EUA). This EUA will remain  in effect (meaning this test can be used) for the duration of the COVID-19 declaration under Section 564(b)(1) of the Act, 21 U.S.C. section 360bbb-3(b)(1), unless the authorization is terminated or revoked sooner.      Influenza A by PCR NEGATIVE NEGATIVE Final   Influenza B by PCR NEGATIVE NEGATIVE Final    Comment: (NOTE) The Xpert Xpress SARS-CoV-2/FLU/RSV assay is intended as an aid in  the diagnosis of influenza from Nasopharyngeal swab specimens and  should not be used as a sole basis for treatment. Nasal washings and  aspirates are unacceptable for Xpert Xpress SARS-CoV-2/FLU/RSV  testing. Fact Sheet for Patients: https://www.moore.com/ Fact Sheet for Healthcare Providers: https://www.young.biz/ This test is not yet approved or cleared by the [place] FDA and  has been authorized for detection and/or diagnosis of SARS-CoV-2 by  FDA under an Emergency Use Authorization (EUA). This EUA will remain  in effect (meaning this test can be used) for the duration of the  Covid-19 declaration under Section 564(b)(1) of the Act, 21  U.S.C. section 360bbb-3(b)(1), unless the authorization is  terminated or revoked. Performed at [facility], [address] [place], [place] [address]   MRSA PCR Screening     Status: None   Collection Time: 03/13/19  4:32 PM   Specimen: Nasal Mucosa; Nasopharyngeal  Result Value Ref Range Status   MRSA by PCR NEGATIVE NEGATIVE Final    Comment:        The GeneXpert MRSA Assay (FDA approved for NASAL specimens only), is one component of a comprehensive MRSA colonization surveillance program. It is not intended to diagnose MRSA infection nor to guide or monitor treatment for  MRSA infections. Performed at [facility], [address] [place], [place] [address]   Urine culture     Status: None   Collection Time: 03/14/19  6:00 AM   Specimen: In/Out Cath Urine  Result Value Ref Range Status   Specimen Description   Final    IN/OUT CATH URINE Performed at [facility], [address], [place], [place] [address]    Special Requests   Final    NONE Performed at [facility], [address], [place], [place] [address]     Culture   Final    NO GROWTH Performed at [facility], [address], [place], [place] [address]    Report Status 03/15/2019 FINAL  Final  MRSA PCR Screening     Status: None   Collection Time: 03/21/19  7:03 PM   Specimen: Nasal Mucosa; Nasopharyngeal  Result Value Ref Range Status   MRSA by PCR NEGATIVE NEGATIVE Final    Comment:        The GeneXpert MRSA Assay (FDA approved for NASAL specimens only), is one component of a comprehensive MRSA colonization surveillance program. It is not intended to diagnose MRSA infection nor to guide or monitor treatment for MRSA infections. Performed at [facility], [address], [place], [place] [address]          Radiology Studies: CARDIAC CATHETERIZATION  Result Date: 03/22/2019  Mid LM to Prox LAD lesion is 80% stenosed with 65% stenosed side branch in Ramus. Prox LAD to Mid LAD lesion is 70% stenosed.  A drug-eluting stent was successfully placed from the LEFT MAIN into the proximal LAD using a SYNERGY XD 3.0X38. = The stent was upsized to 4.3 mm in the left main and 3.3 mm in the LAD  Post intervention, there is a 0% residual stenosis in the LEFT MAIN and LAD.  Post PTCA intervention, the side branch was reduced to 10% residual stenosis.  Ramus-1 lesion is 70% stenosed. Post PTCA 2.0 mm balloon intervention, there is a 35% residual stenosis.  Ramus-2 lesion is 75% stenosed. This lesion was not successfully crossed. Post intervention, there is a 70% residual stenosis.  1st Diag lesion is 90% stenosed.  Dist LAD lesion is 99% stenosed. Post intervention (simply with the guidewire advanced to the distal LAD, the vessel recanalized), there is a 80% residual stenosis.  A drug-eluting stent was successfully placed using a SYNERGY XD 3.0X38.  SUMMARY  Successful atherectomy based PCI of LEFT MAIN-PROXIMAL LAD with placement of 3.0 mm x 38 mm Synergy DES stent postdilated to 4.3 mm in the LEFT MAIN and 3.3 mm in the LAD.   Balloon angioplasty only of the ostial and proximal ramus intermedius (unable to pass one 0 mm balloon beyond the lesion) RECOMMENDATIONS  Patient be transferred back to CCU.  We have applied FemoStop on the left femoral access site which appears to be well controlled.  Plan will be to maintain FemoStop at low pressure for roughly 1 hour and then begin to wean off.  Would consider evaluation and treatment of the RCA lesions at a later date, but would allow for time to recover from this procedure over the weekend.  Given concern for possible blood loss during sheath exchange, we have written for 1 unit transfusion PRBC. Bryan Lemma, MD       Scheduled Meds: . sodium chloride   Intravenous Once  . aspirin  81 mg Oral Daily  . Chlorhexidine Gluconate Cloth  6 each Topical  Daily  . clopidogrel  75 mg Oral Daily  . digoxin  0.125 mg Oral Daily  . insulin aspart  0-15 Units Subcutaneous Q4H  . ivabradine  5 mg Oral BID WC  . mouth rinse  15 mL Mouth Rinse BID  . potassium chloride  40 mEq Oral BID  . rosuvastatin  20 mg Oral q1800  . sodium chloride flush  10-40 mL Intracatheter Q12H  . sodium chloride flush  3 mL Intravenous Q12H  . spironolactone  25 mg Oral Daily   Continuous Infusions: . sodium chloride    . sodium chloride    . sodium chloride 50 mL/hr at 03/22/19 1344  . sodium chloride       LOS: 9 days    Time spent: 35 mins. More than 50% of that time was spent in counseling and/or coordination of care.      Burnadette Pop, MD [facility] P2/12/2019, 2:03 PM

## 2019-03-22 NOTE — H&P (View-Only) (Signed)
Patient ID: Matthew Mccormick, male   DOB: 01/02/1956, 63 y.o.   MRN: [id number] Patient ID: Matthew Mccormick, male   DOB: 10/20/1955, 63 y.o.   MRN: [id number] P    Advanced Heart Failure Rounding Note  PCP-Cardiologist: Suresh Koneswaran, MD   Subjective:    He is off milrinone, co-ox 57%. Remains in sinus tachy.  CVP 11 today. No chest pain or dyspnea today.  He is drowsy but this has been chronic since admission. Creatinine stable 0.74. Losartan held yesterday with low BP and Lasix currently on hold for cath today.  SBP 100s.   RHC/LHC: Coronary Findings  Diagnostic Dominance: Right Left Main  70-80% distal left main stenosis.  Left Anterior Descending  Diffuse 40% ostial/proximal LAD stenosis. Small D1, ostial 90% stenosis. Moderate D2, ostial 75% stenosis. Diffuse up to 40% mid LAD stenosis with occluded distal LAD. There are right to left collaterals.  Ramus Intermedius  Moderate vessel, 80% proximal stenosis.  Left Circumflex  60% distal LCx stenosis into small PLOM.  Right Coronary Artery  70-80% calcified ostial RCA stenosis. 30% mid RCA stenosis. Large PLV branch with 90% stenosis in the mid vessel. The PDA was diffusely diseased with up to 80% stenosis (smaller vessel than PLV). There are left to right collaterals likely to PLV territory.  Intervention  No interventions have been documented. Right Heart  Right Heart Pressures RHC Procedural Findings: Hemodynamics (mmHg) RA mean 9 RV 49/12 PA 54/18, mean 39 PCWP mean 26 LV 89/19 AO 91/62  Oxygen saturations: PA 64% AO 100%  Cardiac Output (Fick) 4.55  Cardiac Index (Fick) 1.98 PVR 2.85 WU     Objective:   Weight Range: 102.4 kg Body mass index is 30.62 kg/m.   Vital Signs:   Temp:  [97.1 F (36.2 C)-98.6 F (37 C)] 97.1 F (36.2 C) (02/11 0449) Pulse Rate:  [79-115] 114 (02/11 0700) Resp:  [0-23] 15 (02/11 0700) BP: (73-116)/(49-103) 105/76 (02/11 0700) SpO2:  [95 %-100 %] 100 % (02/11 0700) Weight:   [102.4 kg] 102.4 kg (02/11 0500) Last BM Date: 03/19/19  Weight change: Filed Weights   03/20/19 0355 03/21/19 0430 03/22/19 0500  Weight: 100 kg 99.8 kg 102.4 kg    Intake/Output:   Intake/Output Summary (Last 24 hours) at 03/22/2019 0713 Last data filed at 03/22/2019 0400 Gross per 24 hour  Intake 480 ml  Output 500 ml  Net -20 ml      Physical Exam   CVP 11 personally checked.  General: NAD Neck: No JVD, no thyromegaly or thyroid nodule.  Lungs: Clear to auscultation bilaterally with normal respiratory effort. CV: Nondisplaced PMI.  Heart regular S1/S2, no S3/S4, no murmur. Trace ankle edema.  Abdomen: Soft, nontender, no hepatosplenomegaly, no distention.  Skin: Intact without lesions or rashes.  Neurologic: Drowsy but awakens,  oriented x 3.  Psych: Normal affect. Extremities: No clubbing or cyanosis.  HEENT: Normal.    Telemetry   ST 100s (personally reviewed).   Labs    CBC Recent Labs    03/21/19 1522 03/21/19 1755  WBC 10.8* 10.6*  NEUTROABS  --  7.9*  HGB 13.9 13.9  HCT 42.1 42.8  MCV 94.0 94.5  PLT 203 202   Basic Metabolic Panel Recent Labs    03/21/19 1816 03/22/19 0500  NA 129* 130*  K 4.5 4.8  CL 90* 87*  CO2 27 28  GLUCOSE 163* 160*  BUN 13 17  CREATININE 0.64 0.74  CALCIUM 8.4* 8.8*   Liver   Function Tests Recent Labs    03/20/19 0410 03/21/19 0603  AST 203* 102*  ALT 535* 372*  ALKPHOS 173* 157*  BILITOT 3.0* 2.2*  PROT 5.4* 5.5*  ALBUMIN 2.3* 2.4*   No results for input(s): LIPASE, AMYLASE in the last 72 hours. Cardiac Enzymes No results for input(s): CKTOTAL, CKMB, CKMBINDEX, TROPONINI in the last 72 hours.  BNP: BNP (last 3 results) Recent Labs    03/14/19 1832  BNP 1,338.6*    ProBNP (last 3 results) No results for input(s): PROBNP in the last 8760 hours.   D-Dimer No results for input(s): DDIMER in the last 72 hours. Hemoglobin A1C No results for input(s): HGBA1C in the last 72 hours. Fasting Lipid  Panel No results for input(s): CHOL, HDL, LDLCALC, TRIG, CHOLHDL, LDLDIRECT in the last 72 hours. Thyroid Function Tests No results for input(s): TSH, T4TOTAL, T3FREE, THYROIDAB in the last 72 hours.  Invalid input(s): FREET3  Other results:   Imaging    No results found.   Medications:     Scheduled Medications: . aspirin  81 mg Oral Daily  . Chlorhexidine Gluconate Cloth  6 each Topical Daily  . clopidogrel  75 mg Oral Daily  . digoxin  0.125 mg Oral Daily  . enoxaparin (LOVENOX) injection  55 mg Subcutaneous Q24H  . insulin aspart  0-15 Units Subcutaneous Q4H  . ivabradine  5 mg Oral BID WC  . mouth rinse  15 mL Mouth Rinse BID  . potassium chloride  40 mEq Oral BID  . rosuvastatin  20 mg Oral q1800  . sodium chloride flush  10-40 mL Intracatheter Q12H  . spironolactone  25 mg Oral Daily    Infusions: . sodium chloride    . sodium chloride    . sodium chloride    . sodium chloride 10 mL/hr at 03/22/19 0602    PRN Medications: sodium chloride, sodium chloride, sodium chloride, acetaminophen, ondansetron (ZOFRAN) IV, ondansetron **OR** [DISCONTINUED] ondansetron (ZOFRAN) IV, Resource ThickenUp Clear, sodium chloride flush, sodium chloride flush    Assessment/Plan   1. Acute on chronic systolic CHF:  Initial cardiogenic shock.  Prior echo in 2018 with EF 30-35%, now echo with EF down to 10-15% with severe RV dysfunction. Ischemic cardiomyopathy based on cath.  No ACS this admission (HS-TnI in the 100s range with no trend).  Initially on milrinone, now off.  Remains in sinus tachy but adequate co-ox 57%, CVP 11. Creatinine stable.  - Lasix held for PCI today, will need diuresis afterwards.  - Continue digoxin 0.125 daily.   - Continue spironolactone to 25 mg daily.  - Losartan held with soft BP.  - Continue ivabradine 5 mg bid.  - Impella will be placed for protected PCI, may need to leave in place for a day or 2 afterwards.  2. ID: Initial concern for RLL PNA on  CT chest with septic shock. However, afebrile and WBCs only mildly elevated. Cultures remains negative. Afebrile  -  Now off abx.   3. Elevated LFTs: Suspect due to shock liver with hypotension.   - LFTs coming down.    4. H/o CVA: Residual left-sided weakness as well as some cognitive dysfunction per notes.    - He continues on Plavix.  - Continue statin.   5. Hyponatremia: Sodium 130. Restrict free water.    6. PAD: Feet with possible ischemic changes.  Moderate RLE vascular disease by ABIs, left normal.  7. CAD: Severe 3VD on cath. Discussed with Dr. Herbie Baltimore, plan for  atherectomy/stenting left main into LAD, ramus, PLV (and ostial RCA depending on flow wire results) today.   Will need Impella support. - Continue ASA/Plavix.  - Continue atorvastatin.  8. Deconditioning: Will need SNF.   Length of Stay: 9  Loralie Champagne, MD  03/22/2019, 7:13 AM  Advanced Heart Failure Team Pager [phone] (M-F; [place])  Please contact [facility] for night-coverage after hours (4p -7a ) and weekends on [email or web address]

## 2019-03-22 NOTE — Progress Notes (Signed)
Patients niece [name]) called and notified of patients transfer to 2H, patient status and procedure in the morning. [name] will be here in the morning to see patient before procedure.

## 2019-03-22 NOTE — Progress Notes (Signed)
Patient ID: HAMP MORELAND, male   DOB: 1955/04/25, 64 y.o.   MRN: [id number] Patient ID: VIR WHETSTINE, male   DOB: 09-13-1955, 64 y.o.   MRN: [id number] P    Advanced Heart Failure Rounding Note  PCP-Cardiologist: Prentice Docker, MD   Subjective:    He is off milrinone, co-ox 57%. Remains in sinus tachy.  CVP 11 today. No chest pain or dyspnea today.  He is drowsy but this has been chronic since admission. Creatinine stable 0.74. Losartan held yesterday with low BP and Lasix currently on hold for cath today.  SBP 100s.   RHC/LHC: Coronary Findings  Diagnostic Dominance: Right Left Main  70-80% distal left main stenosis.  Left Anterior Descending  Diffuse 40% ostial/proximal LAD stenosis. Small D1, ostial 90% stenosis. Moderate D2, ostial 75% stenosis. Diffuse up to 40% mid LAD stenosis with occluded distal LAD. There are right to left collaterals.  [employer]  Moderate vessel, 80% proximal stenosis.  Left Circumflex  60% distal LCx stenosis into small PLOM.  Right Coronary Artery  70-80% calcified ostial RCA stenosis. 30% mid RCA stenosis. Large PLV branch with 90% stenosis in the mid vessel. The PDA was diffusely diseased with up to 80% stenosis (smaller vessel than PLV). There are left to right collaterals likely to PLV territory.  Intervention  No interventions have been documented. Right Heart  Right Heart Pressures RHC Procedural Findings: Hemodynamics (mmHg) RA mean 9 RV 49/12 PA 54/18, mean 39 PCWP mean 26 LV 89/19 AO 91/62  Oxygen saturations: PA 64% AO 100%  Cardiac Output (Fick) 4.55  Cardiac Index (Fick) 1.98 PVR 2.85 WU     Objective:   Weight Range: 102.4 kg Body mass index is 30.62 kg/m.   Vital Signs:   Temp:  [97.1 F (36.2 C)-98.6 F (37 C)] 97.1 F (36.2 C) (02/11 0449) Pulse Rate:  [79-115] 114 (02/11 0700) Resp:  [0-23] 15 (02/11 0700) BP: (73-116)/(49-103) 105/76 (02/11 0700) SpO2:  [95 %-100 %] 100 % (02/11 0700) Weight:   [102.4 kg] 102.4 kg (02/11 0500) Last BM Date: 03/19/19  Weight change: Filed Weights   03/20/19 0355 03/21/19 0430 03/22/19 0500  Weight: 100 kg 99.8 kg 102.4 kg    Intake/Output:   Intake/Output Summary (Last 24 hours) at 03/22/2019 0713 Last data filed at 03/22/2019 0400 Gross per 24 hour  Intake 480 ml  Output 500 ml  Net -20 ml      Physical Exam   CVP 11 personally checked.  General: NAD Neck: No JVD, no thyromegaly or thyroid nodule.  Lungs: Clear to auscultation bilaterally with normal respiratory effort. CV: Nondisplaced PMI.  Heart regular S1/S2, no S3/S4, no murmur. Trace ankle edema.  Abdomen: Soft, nontender, no hepatosplenomegaly, no distention.  Skin: Intact without lesions or rashes.  Neurologic: Drowsy but awakens,  oriented x 3.  Psych: Normal affect. Extremities: No clubbing or cyanosis.  HEENT: Normal.    Telemetry   ST 100s (personally reviewed).   Labs    CBC Recent Labs    03/21/19 1522 03/21/19 1755  WBC 10.8* 10.6*  NEUTROABS  --  7.9*  HGB 13.9 13.9  HCT 42.1 42.8  MCV 94.0 94.5  PLT 203 202   Basic Metabolic Panel Recent Labs    16/96/78 1816 03/22/19 0500  NA 129* 130*  K 4.5 4.8  CL 90* 87*  CO2 27 28  GLUCOSE 163* 160*  BUN 13 17  CREATININE 0.64 0.74  CALCIUM 8.4* 8.8*   Liver  Function Tests Recent Labs    03/20/19 0410 03/21/19 0603  AST 203* 102*  ALT 535* 372*  ALKPHOS 173* 157*  BILITOT 3.0* 2.2*  PROT 5.4* 5.5*  ALBUMIN 2.3* 2.4*   No results for input(s): LIPASE, AMYLASE in the last 72 hours. Cardiac Enzymes No results for input(s): CKTOTAL, CKMB, CKMBINDEX, TROPONINI in the last 72 hours.  BNP: BNP (last 3 results) Recent Labs    03/14/19 1832  BNP 1,338.6*    ProBNP (last 3 results) No results for input(s): PROBNP in the last 8760 hours.   D-Dimer No results for input(s): DDIMER in the last 72 hours. Hemoglobin A1C No results for input(s): HGBA1C in the last 72 hours. Fasting Lipid  Panel No results for input(s): CHOL, HDL, LDLCALC, TRIG, CHOLHDL, LDLDIRECT in the last 72 hours. Thyroid Function Tests No results for input(s): TSH, T4TOTAL, T3FREE, THYROIDAB in the last 72 hours.  Invalid input(s): FREET3  Other results:   Imaging    No results found.   Medications:     Scheduled Medications: . aspirin  81 mg Oral Daily  . Chlorhexidine Gluconate Cloth  6 each Topical Daily  . clopidogrel  75 mg Oral Daily  . digoxin  0.125 mg Oral Daily  . enoxaparin (LOVENOX) injection  55 mg Subcutaneous Q24H  . insulin aspart  0-15 Units Subcutaneous Q4H  . ivabradine  5 mg Oral BID WC  . mouth rinse  15 mL Mouth Rinse BID  . potassium chloride  40 mEq Oral BID  . rosuvastatin  20 mg Oral q1800  . sodium chloride flush  10-40 mL Intracatheter Q12H  . spironolactone  25 mg Oral Daily    Infusions: . sodium chloride    . sodium chloride    . sodium chloride    . sodium chloride 10 mL/hr at 03/22/19 0602    PRN Medications: sodium chloride, sodium chloride, sodium chloride, acetaminophen, ondansetron (ZOFRAN) IV, ondansetron **OR** [DISCONTINUED] ondansetron (ZOFRAN) IV, Resource ThickenUp Clear, sodium chloride flush, sodium chloride flush    Assessment/Plan   1. Acute on chronic systolic CHF:  Initial cardiogenic shock.  Prior echo in 2018 with EF 30-35%, now echo with EF down to 10-15% with severe RV dysfunction. Ischemic cardiomyopathy based on cath.  No ACS this admission (HS-TnI in the 100s range with no trend).  Initially on milrinone, now off.  Remains in sinus tachy but adequate co-ox 57%, CVP 11. Creatinine stable.  - Lasix held for PCI today, will need diuresis afterwards.  - Continue digoxin 0.125 daily.   - Continue spironolactone to 25 mg daily.  - Losartan held with soft BP.  - Continue ivabradine 5 mg bid.  - Impella will be placed for protected PCI, may need to leave in place for a day or 2 afterwards.  2. ID: Initial concern for RLL PNA on  CT chest with septic shock. However, afebrile and WBCs only mildly elevated. Cultures remains negative. Afebrile  -  Now off abx.   3. Elevated LFTs: Suspect due to shock liver with hypotension.   - LFTs coming down.    4. H/o CVA: Residual left-sided weakness as well as some cognitive dysfunction per notes.    - He continues on Plavix.  - Continue statin.   5. Hyponatremia: Sodium 130. Restrict free water.    6. PAD: Feet with possible ischemic changes.  Moderate RLE vascular disease by ABIs, left normal.  7. CAD: Severe 3VD on cath. Discussed with Dr. Herbie Baltimore, plan for  atherectomy/stenting left main into LAD, ramus, PLV (and ostial RCA depending on flow wire results) today.   Will need Impella support. - Continue ASA/Plavix.  - Continue atorvastatin.  8. Deconditioning: Will need SNF.   Length of Stay: 9  Loralie Champagne, MD  03/22/2019, 7:13 AM  Advanced Heart Failure Team Pager [phone] (M-F; [place])  Please contact [facility] for night-coverage after hours (4p -7a ) and weekends on [email or web address]

## 2019-03-22 NOTE — Progress Notes (Signed)
Right femoral sheath pulled @1831 . Pressure applied proximal to insertion site until hemostasis achieved. Pt alert throughout and tolerated w/out complication. Pressure dressing applied to site and pt educated to apply pressure to site if he feels he is going to cough or bear down for any reason. Also instructed to keep right leg straight for 6 hours (til 0100 03/23/19).

## 2019-03-23 ENCOUNTER — Encounter (HOSPITAL_COMMUNITY): Payer: Self-pay | Admitting: Internal Medicine

## 2019-03-23 LAB — GLUCOSE, CAPILLARY
Glucose-Capillary: 155 mg/dL — ABNORMAL HIGH (ref 70–99)
Glucose-Capillary: 130 mg/dL — ABNORMAL HIGH (ref 70–99)
Glucose-Capillary: 149 mg/dL — ABNORMAL HIGH (ref 70–99)
Glucose-Capillary: 238 mg/dL — ABNORMAL HIGH (ref 70–99)
Glucose-Capillary: 220 mg/dL — ABNORMAL HIGH (ref 70–99)
Glucose-Capillary: 184 mg/dL — ABNORMAL HIGH (ref 70–99)

## 2019-03-23 LAB — CBC
HCT: 42.3 % (ref 39.0–52.0)
Hemoglobin: 13.7 g/dL (ref 13.0–17.0)
MCH: 30.7 pg (ref 26.0–34.0)
MCHC: 32.4 g/dL (ref 30.0–36.0)
MCV: 94.8 fL (ref 80.0–100.0)
Platelets: 207 10*3/uL (ref 150–400)
RBC: 4.46 MIL/uL (ref 4.22–5.81)
RDW: 14.2 % (ref 11.5–15.5)
WBC: 9.9 10*3/uL (ref 4.0–10.5)
nRBC: 0 % (ref 0.0–0.2)

## 2019-03-23 LAB — BASIC METABOLIC PANEL
Anion gap: 8 (ref 5–15)
BUN: 21 mg/dL (ref 8–23)
CO2: 26 mmol/L (ref 22–32)
Calcium: 8.4 mg/dL — ABNORMAL LOW (ref 8.9–10.3)
Chloride: 93 mmol/L — ABNORMAL LOW (ref 98–111)
Creatinine, Ser: 0.68 mg/dL (ref 0.61–1.24)
GFR calc Af Amer: >60 mL/min (ref >60)
GFR calc non Af Amer: >60 mL/min (ref >60)
Glucose, Bld: 173 mg/dL — ABNORMAL HIGH (ref 70–99)
Potassium: 4.9 mmol/L (ref 3.5–5.1)
Sodium: 127 mmol/L — ABNORMAL LOW (ref 135–145)

## 2019-03-23 LAB — COOXEMETRY PANEL
Carboxyhemoglobin: 1.8 % — ABNORMAL HIGH (ref 0.5–1.5)
Carboxyhemoglobin: 1.9 % — ABNORMAL HIGH (ref 0.5–1.5)
Methemoglobin: 1 % (ref 0.0–1.5)
Methemoglobin: 0.8 % (ref 0.0–1.5)
O2 Saturation: 49.9 %
O2 Saturation: 61.5 %
Total hemoglobin: 13.9 g/dL (ref 12.0–16.0)
Total hemoglobin: 13.5 g/dL (ref 12.0–16.0)

## 2019-03-23 LAB — TYPE AND SCREEN
ABO/RH(D): A POS
Antibody Screen: NEGATIVE
Unit division: 0

## 2019-03-23 LAB — BPAM RBC
Blood Product Expiration Date: 202103012359
ISSUE DATE / TIME: 202102111335
Unit Type and Rh: 6200

## 2019-03-23 MED ORDER — FUROSEMIDE 10 MG/ML IJ SOLN
40.0000 mg | Freq: Once | INTRAMUSCULAR | Status: AC
  Administered 2019-03-23: 40 mg via INTRAVENOUS
  Filled 2019-03-23: qty 4

## 2019-03-23 NOTE — TOC Progression Note (Signed)
Transition of Care [facility]) - Progression Note    Patient Details  Name: Matthew Mccormick MRN: [id number] Date of Birth: 21-Jul-1955  [facility]) CM/SW Contact  Nonda Lou, [place] Phone Number: 03/23/2019, 10:54 AM  Clinical Narrative:    CSW spoke with patient's cousin Dinah about SNF placement once patient is medically cleared. Dinah stated patient has been to [facility] before and that would be the preference upon discharge. CSW will continue to follow and assist with discharge planning needs.  Expected Discharge Plan: Home w Home Health Services Barriers to Discharge: Continued Medical Work up  Expected Discharge Plan and Services Expected Discharge Plan: Home w Home Health Services       Living arrangements for the past 2 months: Apartment                                       Social Determinants of Health (SDOH) Interventions    Readmission Risk Interventions No flowsheet data found.

## 2019-03-23 NOTE — Progress Notes (Addendum)
PROGRESS NOTE    Matthew Mccormick  ZOX:[id number] DOB: [birth year] DOA: 03/13/2019 PCP: Kirstie Peri, MD   Brief Narrative: Patient is a 64 year old male with history of atrial fibrillation, diabetes type 2, hypertension, thombotic stroke who presented at [facility] hospital initially with several days of weakness, malaise, nausea, vomiting.  He was hypotensive on presentation.  He was suspected to have  cardiogenic shock and was transferred to [facility] for further management. Heart failure team is following.  He underwent cardiac cath on 03/19/2019 with finding of multivessel disease.   Underwent atherectomy based PCI of left main-proximal LAD with placement of DES, balloon angioplasty of ostial and proximal ramus intermedius on 03/22/19.  Assessment & Plan:   Active Problems:   Sepsis (HCC)   Pressure injury of skin   Ischemic hepatitis   Cardiogenic shock:  Cardiology team following.  Started on Lasix,spironolactone,digoxin,losartan,ivabradine.Currently only on spironolactone, digoxin, ivabradine. Prior echo in 2018 showed ejection fraction of 30 to 35%, current echo showing EF of 10 to 15% with severe right ventricular dysfunction. Underwent right/left heart catheterization on 03/19/2019 with finding of multivessel disease. He is a poor candidate for CABG. Underwent atherectomy based PCI of left main-proximal LAD with placement of DES, balloon angioplasty of ostial and proximal ramus intermedius on 03/22/19. Plan for PCI to PLV, FloWire ostial RCA on Monday.  Acute metabolic encephalopathy: Currently alert and oriented.  Patient lives by himself  in [place].  Acute hypoxemic respiratory failure: Present on admission.  Suspected to be from acute pulmonary edema from heart failure.  Chest x-ray did not show pneumonia.  CT chest was concerning for right lower lobe pneumonia but he is afebrile without leukocytosis.  Not on antibiotics.    Hypertension: Currently blood pressure soft.  Continue  current regimen  Diabetes mellitus 2: Continue sliding-scale insulin.  On insulin at home  History of CVA: Continue Plavix.  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 On statin  Elevated liver enzymes: Secondary to combination of hepatic condition, shock liver.  Liver enzymes trending down.  Suspected peripheral artery disease: Has a bilateral small ulcerations on bilateral legs.Present on admission.ABI showed moderate right lower  extremity arterial disease .  Will recommend follow-up with vascular surgery as an outpatient.Conitnue plavix.  On statin.  Hyponatremia:Likely econdary to hypervolemic hyponatremia from CHF.  Continue to monitor.  Sodium is 127 today.  Continue to restrict fluid.  Deconditioning/debility: Seen by physical therapy and recommended skilled nursing facility on discharge.  Pressure Injury 03/13/19 Sacrum Stage 1 -  Intact skin with non-blanchable redness of a localized area usually over a bony prominence. (Active)  03/13/19 1700  Location: Sacrum  Location Orientation:   Staging: Stage 1 -  Intact skin with non-blanchable redness of a localized area usually over a bony prominence.  Wound Description (Comments):   Present on Admission: Yes              DVT prophylaxis:Lovenox Code Status: Full Family Communication: Cousin on phone Disposition Plan: Patient is from home.  PT/oT recommending SnF.  He is not clinically ready for discharge and cardiology hasnt cleared for dc.  Consultants: Cardiology  Procedures:None  Antimicrobials:  Anti-infectives (From admission, onward)   Start     Dose/Rate Route Frequency Ordered Stop   03/14/19 1200  cefTRIAXone (ROCEPHIN) 2 g in sodium chloride 0.9 % 100 mL IVPB  Status:  Discontinued     2 g 200 mL/hr over 30 Minutes Intravenous Every 24 hours 03/14/19 0951  03/15/19 0954   03/14/19 1100  azithromycin (ZITHROMAX) 500 mg in sodium chloride 0.9 % 250 mL IVPB  Status:   Discontinued     500 mg 250 mL/hr over 60 Minutes Intravenous Every 24 hours 03/14/19 0951 03/15/19 0954   03/14/19 0000  vancomycin (VANCOREADY) IVPB 1250 mg/250 mL  Status:  Discontinued     1,250 mg 166.7 mL/hr over 90 Minutes Intravenous Every 12 hours 03/13/19 1158 03/14/19 0951   03/13/19 2000  ceFEPIme (MAXIPIME) 2 g in sodium chloride 0.9 % 100 mL IVPB  Status:  Discontinued     2 g 200 mL/hr over 30 Minutes Intravenous Every 8 hours 03/13/19 1058 03/14/19 0951   03/13/19 1030  vancomycin (VANCOCIN) IVPB 1000 mg/200 mL premix  Status:  Discontinued     1,000 mg 200 mL/hr over 60 Minutes Intravenous Every 1 hr x 2 03/13/19 1001 03/13/19 1002   03/13/19 1030  vancomycin (VANCOREADY) IVPB 2000 mg/400 mL     2,000 mg 200 mL/hr over 120 Minutes Intravenous  Once 03/13/19 1002 03/13/19 1343   03/13/19 1000  ceFEPIme (MAXIPIME) 2 g in sodium chloride 0.9 % 100 mL IVPB     2 g 200 mL/hr over 30 Minutes Intravenous  Once 03/13/19 0952 03/13/19 1103   03/13/19 1000  metroNIDAZOLE (FLAGYL) IVPB 500 mg     500 mg 100 mL/hr over 60 Minutes Intravenous  Once 03/13/19 0952 03/13/19 1141   03/13/19 1000  vancomycin (VANCOCIN) IVPB 1000 mg/200 mL premix  Status:  Discontinued     1,000 mg 200 mL/hr over 60 Minutes Intravenous  Once 03/13/19 9528 03/13/19 1001      Subjective:  Patient seen and examined at the bedside this morning.  Hemodynamically stable today.  Blood pressure has improved.  Mild sinus tachycardia.  Sitting on the chair and eating his breakfast.  Denies any complaints like shortness of breath or chest pain.  Objective: Vitals:   03/23/19 0400 03/23/19 0500 03/23/19 0600 03/23/19 0700  BP: (!) 89/73 93/72 (!) 83/65 94/75  Pulse: (!) 105 100 100 (!) 104  Resp: (!) Temp: 97.7 F (36.5 C)     TempSrc: Oral     SpO2: 90% 98% 96% 100%  Weight:      Height:        Intake/Output Summary (Last 24 hours) at 03/23/2019 0732 Last data filed at 03/23/2019  0600 Gross per 24 hour  Intake 1461.06 ml  Output 850 ml  Net 611.06 ml   Filed Weights   03/21/19 0430 03/22/19 0500 03/23/19 0100  Weight: 99.8 kg 102.4 kg 105.1 kg    Examination: .  General exam: Comfortable, generalized weakness, alert and oriented Respiratory system: No wheezes or crackles , diminished air sounds on the bases Cardiovascular system: Sinus tachycradia. No JVD, murmurs, rubs, gallops or clicks. Gastrointestinal system: Abdomen is nondistended, soft and nontender. No organomegaly or masses felt. Normal bowel sounds heard. Central nervous system: Alert and oriented. No focal neurological deficits. Extremities: Trace bilateral lower extremity edema, no clubbing ,no cyanosis Skin: No rashes, lesions ,no icterus ,no pallor, ulcers covered by dressings on bilateral lower extremities     Data Reviewed: I have personally reviewed following labs and imaging studies  CBC: Recent Labs  Lab 03/20/19 0410 03/20/19 0410 03/21/19 1522 03/21/19 1522 03/21/19 1755 03/22/19 1023 03/22/19 1156 03/22/19 2008 03/23/19 0358  WBC 11.3*  --  10.8*  --  10.6*  --   --  12.1*  9.9  NEUTROABS  --   --   --   --  7.9*  --   --   --   --   HGB 14.0   < > 13.9   < > 13.9 15.3 15.0 13.5 13.7  HCT 42.2   < > 42.1   < > 42.8 45.0 44.0 41.7 42.3  MCV 92.1  --  94.0  --  94.5  --   --  93.9 94.8  PLT 191  --  203  --  202  --   --  198 207   < > = values in this interval not displayed.   Basic Metabolic Panel: Recent Labs  Lab 03/18/19 0509 03/18/19 0509 03/19/19 0359 03/19/19 1245 03/20/19 0410 03/20/19 0410 03/21/19 0603 03/21/19 0603 03/21/19 1816 03/22/19 0500 03/22/19 1023 03/22/19 1156 03/22/19 2008 03/23/19 0358  NA 129*   < > 127*   < > 130*   < > 130*   < > 129* 130* 105* 127*  --  127*  K 3.5   < > 4.0   < > 3.6   < > 4.3   < > 4.5 4.8 3.7 5.1  --  4.9  CL 93*   < > 85*  --  87*  --  92*  --  90* 87*  --   --   --  93*  CO2 29   < > 29  --  31  --  29  --   27 28  --   --   --  26  GLUCOSE 155*   < > 154*  --  167*  --  186*  --  163* 160*  --   --   --  173*  BUN 14   < > 12  --  11  --  12  --  13 17  --   --   --  21  CREATININE 0.65   < > 0.68  --  0.62   < > 0.65  --  0.64 0.74  --   --  0.76 0.68  CALCIUM 7.8*   < > 8.2*  --  8.3*  --  8.3*  --  8.4* 8.8*  --   --   --  8.4*  MG 1.7  --  1.8  --   --   --   --   --   --   --   --   --   --   --    < > = values in this interval not displayed.   GFR: Estimated Creatinine Clearance: 118.4 mL/min (by C-G formula based on SCr of 0.68 mg/dL). Liver Function Tests: Recent Labs  Lab 03/17/19 0500 03/18/19 0509 03/19/19 0359 03/20/19 0410 03/21/19 0603  AST 533* 480* 400* 203* 102*  ALT 1,080* 957* 722* 535* 372*  ALKPHOS 248* 231* 206* 173* 157*  BILITOT 2.3* 2.6* 3.0* 3.0* 2.2*  PROT 5.0* 5.4* 5.3* 5.4* 5.5*  ALBUMIN 2.3* 2.5* 2.4* 2.3* 2.4*   No results for input(s): LIPASE, AMYLASE in the last 168 hours. No results for input(s): AMMONIA in the last 168 hours. Coagulation Profile: Recent Labs  Lab 03/17/19 0500 03/22/19 0500  INR 1.9* 1.3*   Cardiac Enzymes: No results for input(s): CKTOTAL, CKMB, CKMBINDEX, TROPONINI in the last 168 hours. BNP (last 3 results) No results for input(s): PROBNP in the last 8760 hours. HbA1C: No results for input(s): HGBA1C in the last 72 hours.  CBG: Recent Labs  Lab 03/22/19 1558 03/22/19 2022 03/22/19 2337 03/23/19 0404 03/23/19 0701  GLUCAP 168* 274* 252* 155* 130*   Lipid Profile: No results for input(s): CHOL, HDL, LDLCALC, TRIG, CHOLHDL, LDLDIRECT in the last 72 hours. Thyroid Function Tests: No results for input(s): TSH, T4TOTAL, FREET4, T3FREE, THYROIDAB in the last 72 hours. Anemia Panel: No results for input(s): VITAMINB12, FOLATE, FERRITIN, TIBC, IRON, RETICCTPCT in the last 72 hours. Sepsis Labs: Recent Labs  Lab 03/16/19 0756 03/16/19 1156  LATICACIDVEN 1.4 2.1*    Recent Results (from the past 240 hour(s))   Blood Culture (routine x 2)     Status: None   Collection Time: 03/13/19 10:15 AM   Specimen: BLOOD RIGHT FOREARM  Result Value Ref Range Status   Specimen Description BLOOD RIGHT FOREARM DRAWN BY RN BP  Final   Special Requests   Final    Blood Culture results may not be optimal due to an inadequate volume of blood received in culture bottles   Culture   Final    NO GROWTH 5 DAYS Performed at [facility], [address], [place], [place] [address]    Report Status 03/18/2019 FINAL  Final  Blood Culture (routine x 2)     Status: None   Collection Time: 03/13/19 10:20 AM   Specimen: BLOOD LEFT FOREARM  Result Value Ref Range Status   Specimen Description BLOOD LEFT FOREARM DRAWN BY RN BP  Final   Special Requests   Final    BOTTLES DRAWN AEROBIC AND ANAEROBIC Blood Culture results may not be optimal due to an inadequate volume of blood received in culture bottles   Culture   Final    NO GROWTH 5 DAYS Performed at [facility], [address] [place], [place] [address]    Report Status 03/18/2019 FINAL  Final  Respiratory Panel by RT PCR (Flu A&B, Covid) - Nasopharyngeal Swab     Status: None   Collection Time: 03/13/19 10:58 AM   Specimen: Nasopharyngeal Swab  Result Value Ref Range Status   SARS Coronavirus 2 by RT PCR NEGATIVE NEGATIVE Final    Comment: (NOTE) SARS-CoV-2 target nucleic acids are NOT DETECTED. The SARS-CoV-2 RNA is generally detectable in upper respiratoy specimens during the acute phase of infection. The lowest concentration of SARS-CoV-2 viral copies this assay can detect is 131 copies/mL. A negative result does not preclude SARS-Cov-2 infection and should not be used as the sole basis for treatment or other patient management decisions. A negative result may occur with  improper specimen collection/handling, submission of specimen other than nasopharyngeal swab, presence of viral mutation(s) within the areas targeted by this assay, and inadequate number of  viral copies (<131 copies/mL). A negative result must be combined with clinical observations, patient history, and epidemiological information. The expected result is Negative. Fact Sheet for Patients:  https://www.moore.com/ Fact Sheet for Healthcare Providers:  https://www.young.biz/ This test is not yet ap proved or cleared by the [place] FDA and  has been authorized for detection and/or diagnosis of SARS-CoV-2 by FDA under an Emergency Use Authorization (EUA). This EUA will remain  in effect (meaning this test can be used) for the duration of the COVID-19 declaration under Section 564(b)(1) of the Act, 21 U.S.C. section 360bbb-3(b)(1), unless the authorization is terminated or revoked sooner.    Influenza A by PCR NEGATIVE NEGATIVE Final   Influenza B by PCR NEGATIVE NEGATIVE Final    Comment: (NOTE) The Xpert Xpress SARS-CoV-2/FLU/RSV assay is intended as  an aid in  the diagnosis of influenza from Nasopharyngeal swab specimens and  should not be used as a sole basis for treatment. Nasal washings and  aspirates are unacceptable for Xpert Xpress SARS-CoV-2/FLU/RSV  testing. Fact Sheet for Patients: https://www.moore.com/ Fact Sheet for Healthcare Providers: https://www.young.biz/ This test is not yet approved or cleared by the [place] FDA and  has been authorized for detection and/or diagnosis of SARS-CoV-2 by  FDA under an Emergency Use Authorization (EUA). This EUA will remain  in effect (meaning this test can be used) for the duration of the  Covid-19 declaration under Section 564(b)(1) of the Act, 21  U.S.C. section 360bbb-3(b)(1), unless the authorization is  terminated or revoked. Performed at [facility], [address] [place], [place] [address]   MRSA PCR Screening     Status: None   Collection Time: 03/13/19  4:32 PM   Specimen: Nasal Mucosa; Nasopharyngeal  Result Value Ref  Range Status   MRSA by PCR NEGATIVE NEGATIVE Final    Comment:        The GeneXpert MRSA Assay (FDA approved for NASAL specimens only), is one component of a comprehensive MRSA colonization surveillance program. It is not intended to diagnose MRSA infection nor to guide or monitor treatment for MRSA infections. Performed at [facility], [address] [place], [place] [address]   Urine culture     Status: None   Collection Time: 03/14/19  6:00 AM   Specimen: In/Out Cath Urine  Result Value Ref Range Status   Specimen Description   Final    IN/OUT CATH URINE Performed at [facility], [address] [place], [place] [address]    Special Requests   Final    NONE Performed at [facility], [address] [place], [place] [address]    Culture   Final    NO GROWTH Performed at [facility], [address], [place], [place] [address]    Report Status 03/15/2019 FINAL  Final  MRSA PCR Screening     Status: None   Collection Time: 03/21/19  7:03 PM   Specimen: Nasal Mucosa; Nasopharyngeal  Result Value Ref Range Status   MRSA by PCR NEGATIVE NEGATIVE Final    Comment:        The GeneXpert MRSA Assay (FDA approved for NASAL specimens only), is one component of a comprehensive MRSA colonization surveillance program. It is not intended to diagnose MRSA infection nor to guide or monitor treatment for MRSA infections. Performed at [facility], [address], [place], [place] [address]          Radiology Studies: CARDIAC CATHETERIZATION  Result Date: 03/22/2019  Mid LM to Prox LAD lesion is 80% stenosed with 65% stenosed side branch in Ramus. Prox LAD to Mid LAD lesion is 70% stenosed.  A drug-eluting stent was successfully placed from the LEFT MAIN into the proximal LAD using a SYNERGY XD 3.0X38. = The stent was upsized to 4.3 mm in the left main and 3.3 mm in the LAD  Post intervention, there is a 0% residual stenosis in the LEFT MAIN and LAD.  Post  PTCA intervention, the side branch was reduced to 10% residual stenosis.  Ramus-1 lesion is 70% stenosed. Post PTCA 2.0 mm balloon intervention, there is a 35% residual stenosis.  Ramus-2 lesion is 75% stenosed. This lesion was not successfully crossed. Post intervention, there is a 70% residual stenosis.  1st Diag lesion is 90% stenosed.  Dist LAD lesion  is 99% stenosed. Post intervention (simply with the guidewire advanced to the distal LAD, the vessel recanalized), there is a 80% residual stenosis.  A drug-eluting stent was successfully placed using a SYNERGY XD 3.0X38.  SUMMARY  Successful atherectomy based PCI of LEFT MAIN-PROXIMAL LAD with placement of 3.0 mm x 38 mm Synergy DES stent postdilated to 4.3 mm in the LEFT MAIN and 3.3 mm in the LAD.  Balloon angioplasty only of the ostial and proximal ramus intermedius (unable to pass one 0 mm balloon beyond the lesion) RECOMMENDATIONS  Patient be transferred back to CCU.  We have applied FemoStop on the left femoral access site which appears to be well controlled.  Plan will be to maintain FemoStop at low pressure for roughly 1 hour and then begin to wean off.  Would consider evaluation and treatment of the RCA lesions at a later date, but would allow for time to recover from this procedure over the weekend.  Given concern for possible blood loss during sheath exchange, we have written for 1 unit transfusion PRBC. Bryan Lemma, MD       Scheduled Meds: . sodium chloride   Intravenous Once  . aspirin  81 mg Oral Daily  . Chlorhexidine Gluconate Cloth  6 each Topical Daily  . clopidogrel  75 mg Oral Daily  . digoxin  0.125 mg Oral Daily  . insulin aspart  0-15 Units Subcutaneous Q4H  . ivabradine  5 mg Oral BID WC  . mouth rinse  15 mL Mouth Rinse BID  . potassium chloride  40 mEq Oral BID  . rosuvastatin  20 mg Oral q1800  . sodium chloride flush  10-40 mL Intracatheter Q12H  . sodium chloride flush  3 mL Intravenous Q12H  .  spironolactone  25 mg Oral Daily   Continuous Infusions: . sodium chloride    . sodium chloride    . sodium chloride 10 mL/hr at 03/23/19 0000     LOS: 10 days    Time spent: 35 mins. More than 50% of that time was spent in counseling and/or coordination of care.      Burnadette Pop, MD [facility] P2/01/2020, 7:32 AM

## 2019-03-23 NOTE — NC FL2 (Signed)
[place] MEDICAID FL2 LEVEL OF CARE SCREENING TOOL     IDENTIFICATION  Patient Name: Matthew Mccormick Birthdate: 01/17/1956 Sex: male Admission Date (Current Location): 03/13/2019  [facility] and [place] Number:  [employer] and Address:  [facility] [place], [address], [place], [place] [address]      Provider Number: [id number]  Attending Physician Name and Address:  Burnadette Pop, MD  Relative Name and Phone Number:  [name] [phone]    Current Level of Care: Hospital Recommended Level of Care: Skilled Nursing Facility Prior Approval Number:    Date Approved/Denied:   PASRR Number: [id number]  Discharge Plan: SNF    Current Diagnoses: Patient Active Problem List   Diagnosis Date Noted  . Pressure injury of skin 03/14/2019  . Ischemic hepatitis   . Sepsis (HCC) 03/13/2019  . Elevated LFTs   . Elevated INR   . Diabetes mellitus (HCC) 07/26/2016  . Hypertension 07/26/2016  . Coronary artery disease 07/26/2016  . History of CVA (cerebrovascular accident) 07/26/2016  . Atrial fibrillation (HCC) 07/26/2016  . Dyslipidemia 07/26/2016  . Former cigarette smoker 07/26/2016  . Osteomyelitis of toe of left foot (HCC) 07/26/2016    Orientation RESPIRATION BLADDER Height & Weight     Self, Situation, Place  O2(Nasal Cannula) Incontinent, External catheter Weight: 231 lb 11.3 oz (105.1 kg)(verified) Height:  6' (182.9 cm)  BEHAVIORAL SYMPTOMS/MOOD NEUROLOGICAL BOWEL NUTRITION STATUS      Continent Diet(see d/c summary)  AMBULATORY STATUS COMMUNICATION OF NEEDS Skin   Extensive Assist Verbally Other (Comment)(Pressure Injury)                       Personal Care Assistance Level of Assistance  Bathing, Dressing, Feeding Bathing Assistance: Maximum assistance Feeding assistance: Maximum assistance Dressing Assistance: Maximum assistance     Functional Limitations Info             SPECIAL CARE FACTORS FREQUENCY  PT (By  licensed PT), OT (By licensed OT)     PT Frequency: 5x a week OT Frequency: 5x a week            Contractures Contractures Info: Not present    Additional Factors Info  Code Status, Allergies, Insulin Sliding Scale Code Status Info: Full Allergies Info: NKA   Insulin Sliding Scale Info: 0-15 units       Current Medications (03/23/2019):  This is the current hospital active medication list Current Facility-Administered Medications  Medication Dose Route Frequency Provider Last Rate Last Admin  . 0.9 %  sodium chloride infusion (Manually program via Guardrails IV Fluids)   Intravenous Once Marykay Lex, MD      . 0.9 %  sodium chloride infusion   Intravenous PRN Marykay Lex, MD      . 0.9 %  sodium chloride infusion  250 mL Intravenous PRN Marykay Lex, MD      . 0.9 %  sodium chloride infusion  250 mL Intravenous PRN Marykay Lex, MD 10 mL/hr at 03/23/19 1300 Rate Verify at 03/23/19 1300  . acetaminophen (TYLENOL) tablet 650 mg  650 mg Oral Q4H PRN Marykay Lex, MD   650 mg at 03/23/19 1455  . aspirin chewable tablet 81 mg  81 mg Oral Daily Marykay Lex, MD   81 mg at 03/23/19 1046  . Chlorhexidine Gluconate Cloth 2 % PADS 6 each  6 each Topical Daily Marykay Lex, MD   6 each  at 03/22/19 2100  . clopidogrel (PLAVIX) tablet 75 mg  75 mg Oral Daily Leonie Man, MD   75 mg at 03/23/19 1046  . digoxin (LANOXIN) tablet 0.125 mg  0.125 mg Oral Daily Leonie Man, MD   0.125 mg at 03/23/19 1046  . insulin aspart (novoLOG) injection 0-15 Units  0-15 Units Subcutaneous Q4H Leonie Man, MD   5 Units at 03/23/19 1207  . ivabradine (CORLANOR) tablet 5 mg  5 mg Oral BID WC Leonie Man, MD   5 mg at 03/23/19 0830  . MEDLINE mouth rinse  15 mL Mouth Rinse BID Leonie Man, MD   15 mL at 03/22/19 2127  . ondansetron (ZOFRAN) injection 4 mg  4 mg Intravenous Q6H PRN Leonie Man, MD      . ondansetron [facility]) tablet 4 mg  4 mg Oral Q6H PRN  Leonie Man, MD      . Resource ThickenUp Clear   Oral PRN Leonie Man, MD   Given at 03/16/19 1129  . rosuvastatin (CRESTOR) tablet 20 mg  20 mg Oral q1800 Leonie Man, MD   20 mg at 03/22/19 1614  . sodium chloride flush (NS) 0.9 % injection 10-40 mL  10-40 mL Intracatheter Q12H Leonie Man, MD   10 mL at 03/22/19 2127  . sodium chloride flush (NS) 0.9 % injection 10-40 mL  10-40 mL Intracatheter PRN Leonie Man, MD      . sodium chloride flush (NS) 0.9 % injection 3 mL  3 mL Intravenous Q12H Leonie Man, MD   3 mL at 03/22/19 1615  . sodium chloride flush (NS) 0.9 % injection 3 mL  3 mL Intravenous PRN Leonie Man, MD      . spironolactone (ALDACTONE) tablet 25 mg  25 mg Oral Daily Leonie Man, MD   25 mg at 03/23/19 1045     Discharge Medications: Please see discharge summary for a list of discharge medications.  Relevant Imaging Results:  Relevant Lab Results:   Additional Information SSN [id number]  [place], LCSW

## 2019-03-23 NOTE — Progress Notes (Signed)
Physical Therapy Treatment Patient Details Name: Matthew Mccormick MRN: [id number] DOB: 1955/07/04 Today's Date: 03/23/2019    History of Present Illness Pt is 64 y/o M presents to the ER today after having had a fall at home.  He was trying to go to the bathroom and fell between his bed and dresser. Work up showed sepsis, R lower lobe PNA, peripheral cyanosis with increased INR, and elevated troponins with abnormal ECGs. PMH includes CKD, DM2, HTN, and stroke. Pt underwent PCI to LAD on 2/11.    PT Comments    Pt tolerated treatment well, continuing to require significant physical assistance for all OOB mobility due to posterior lean. Pt feet sliding forward without foot block unless PT providing foot block. Posterior lean persists with use of RW and even throughout steps to pivot to recliner. Pt will benefit from continued acute PT POC to improve functional mobility and standing balance in order to reduce falls risk and caregiver burden.   Follow Up Recommendations  SNF     Equipment Recommendations  (defer to post-acute setting)    Recommendations for Other Services       Precautions / Restrictions Precautions Precautions: Fall Restrictions Weight Bearing Restrictions: No    Mobility  Bed Mobility Overal bed mobility: Needs Assistance Bed Mobility: Supine to Sit     Supine to sit: Min assist;HOB elevated     General bed mobility comments: pt utilizing rails significantly during mobility  Transfers Overall transfer level: Needs assistance Equipment used: Rolling walker (2 wheeled) Transfers: Sit to/from Stand Sit to Stand: Max assist Stand pivot transfers: Max assist       General transfer comment: pt with strong posterior lean during all attempts at standing, PT blocking pt's feet as sliding forward even with non-slip socks on  Ambulation/Gait                 Stairs             Wheelchair Mobility    Modified Rankin (Stroke Patients Only)        Balance Overall balance assessment: Needs assistance Sitting-balance support: Single extremity supported;Feet supported Sitting balance-Leahy Scale: Fair Sitting balance - Comments: minG to close supervision   Standing balance support: Bilateral upper extremity supported Standing balance-Leahy Scale: Zero Standing balance comment: maxA with BUE support, pt with posterior lean                            Cognition Arousal/Alertness: Awake/alert Behavior During Therapy: Anxious Overall Cognitive Status: No family/caregiver present to determine baseline cognitive functioning                                        Exercises      General Comments General comments (skin integrity, edema, etc.): pt tachy into 120s durign mobility, on [address]      Pertinent Vitals/Pain Pain Assessment: No/denies pain    Home Living                      Prior Function            PT Goals (current goals can now be found in the care plan section) Acute Rehab PT Goals Patient Stated Goal: return home after rehab Progress towards PT goals: Not progressing toward goals - comment(continues to require significant assistance)  Frequency    Min 2X/week      PT Plan Current plan remains appropriate    Co-evaluation              AM-PAC PT "6 Clicks" Mobility   Outcome Measure  Help needed turning from your back to your side while in a flat bed without using bedrails?: A Little Help needed moving from lying on your back to sitting on the side of a flat bed without using bedrails?: A Little Help needed moving to and from a bed to a chair (including a wheelchair)?: Total Help needed standing up from a chair using your arms (e.g., wheelchair or bedside chair)?: Total Help needed to walk in hospital room?: Total Help needed climbing 3-5 steps with a railing? : Total 6 Click Score: 10    End of Session Equipment Utilized During Treatment: Gait  belt;Oxygen Activity Tolerance: Patient tolerated treatment well Patient left: in chair;with call bell/phone within reach Nurse Communication: Mobility status PT Visit Diagnosis: Unsteadiness on feet (R26.81);Other abnormalities of gait and mobility (R26.89);Muscle weakness (generalized) (M62.81)     Time: 1324-4010 PT Time Calculation (min) (ACUTE ONLY): 27 min  Charges:  $Therapeutic Activity: 23-37 mins                     Zenaida Niece, PT, DPT Acute Rehabilitation Pager: [phone]    Zenaida Niece 03/23/2019, 12:34 PM

## 2019-03-23 NOTE — Progress Notes (Addendum)
Patient ID: Matthew Mccormick, male   DOB: 05-15-1955, 64 y.o.   MRN: [id number] P    Advanced Heart Failure Rounding Note  PCP-Cardiologist: Prentice Docker, MD   Subjective:    More awake this morning than yesterday.  No chest pain or dyspnea.  SBP chronically 80s-90s with HR around 110 ST.  Co-ox lower drawn in early am (50%).  CVP 11.  Creatinine stable at 0.68.   RHC/LHC (2/8): Coronary Findings  Diagnostic Dominance: Right Left Main  70-80% distal left main stenosis.  Left Anterior Descending  Diffuse 40% ostial/proximal LAD stenosis. Small D1, ostial 90% stenosis. Moderate D2, ostial 75% stenosis. Diffuse up to 40% mid LAD stenosis with occluded distal LAD. There are right to left collaterals.  Ramus Intermedius  Moderate vessel, 80% proximal stenosis.  Left Circumflex  60% distal LCx stenosis into small PLOM.  Right Coronary Artery  70-80% calcified ostial RCA stenosis. 30% mid RCA stenosis. Large PLV branch with 90% stenosis in the mid vessel. The PDA was diffusely diseased with up to 80% stenosis (smaller vessel than PLV). There are left to right collaterals likely to PLV territory.  Intervention  No interventions have been documented. Right Heart  Right Heart Pressures RHC Procedural Findings: Hemodynamics (mmHg) RA mean 9 RV 49/12 PA 54/18, mean 39 PCWP mean 26 LV 89/19 AO 91/62  Oxygen saturations: PA 64% AO 100%  Cardiac Output (Fick) 4.55  Cardiac Index (Fick) 1.98 PVR 2.85 WU   2/11: Atherectomy/DES to LM/proximal LAD and PTCA ramus, protected by Impella.  Impella removed after case with hematoma left groin.   Objective:   Weight Range: 105.1 kg Body mass index is 31.42 kg/m.   Vital Signs:   Temp:  [96.7 F (35.9 C)-98.1 F (36.7 C)] 98.1 F (36.7 C) (02/12 0735) Pulse Rate:  [0-119] 104 (02/12 0700) Resp:  [4-26] 12 (02/12 0700) BP: (75-145)/(48-123) 94/75 (02/12 0700) SpO2:  [0 %-100 %] 100 % (02/12 0700) Arterial Line BP:  (97-131)/(64-91) 131/74 (02/11 1800) Weight:  [105.1 kg] 105.1 kg (02/12 0100) Last BM Date: 03/19/19  Weight change: Filed Weights   03/21/19 0430 03/22/19 0500 03/23/19 0100  Weight: 99.8 kg 102.4 kg 105.1 kg    Intake/Output:   Intake/Output Summary (Last 24 hours) at 03/23/2019 0738 Last data filed at 03/23/2019 0600 Gross per 24 hour  Intake 1461.06 ml  Output 850 ml  Net 611.06 ml      Physical Exam   CVP 11 personally checked.  General: NAD Neck: JVP 8-9 cm, no thyromegaly or thyroid nodule.  Lungs: Clear to auscultation bilaterally with normal respiratory effort. CV: Nondisplaced PMI.  Heart mildly tachy, regular S1/S2, no S3/S4, no murmur.  No peripheral edema.  N Abdomen: Soft, nontender, no hepatosplenomegaly, no distention.  Skin: Intact without lesions or rashes.  Neurologic: Alert and oriented x 3.  Psych: Normal affect. Extremities: No clubbing or cyanosis. Left groin ecchymosis, stable.  HEENT: Normal.    Telemetry   ST 100s-110s (personally reviewed).   Labs    CBC Recent Labs    03/21/19 1755 03/22/19 1023 03/22/19 2008 03/23/19 0358  WBC 10.6*  --  12.1* 9.9  NEUTROABS 7.9*  --   --   --   HGB 13.9   < > 13.5 13.7  HCT 42.8   < > 41.7 42.3  MCV 94.5  --  93.9 94.8  PLT 202  --  198 207   < > = values in this interval not displayed.  Basic Metabolic Panel Recent Labs    03/22/19 0500 03/22/19 0500 03/22/19 1023 03/22/19 1156 03/22/19 2008 03/23/19 0358  NA 130*  --    < > 127*  --  127*  K 4.8  --    < > 5.1  --  4.9  CL 87*  --   --   --   --  93*  CO2 28  --   --   --   --  26  GLUCOSE 160*  --   --   --   --  173*  BUN 17  --   --   --   --  21  CREATININE 0.74   < >  --   --  0.76 0.68  CALCIUM 8.8*  --   --   --   --  8.4*   < > = values in this interval not displayed.   Liver Function Tests Recent Labs    03/21/19 0603  AST 102*  ALT 372*  ALKPHOS 157*  BILITOT 2.2*  PROT 5.5*  ALBUMIN 2.4*   No results for  input(s): LIPASE, AMYLASE in the last 72 hours. Cardiac Enzymes No results for input(s): CKTOTAL, CKMB, CKMBINDEX, TROPONINI in the last 72 hours.  BNP: BNP (last 3 results) Recent Labs    03/14/19 1832  BNP 1,338.6*    ProBNP (last 3 results) No results for input(s): PROBNP in the last 8760 hours.   D-Dimer No results for input(s): DDIMER in the last 72 hours. Hemoglobin A1C No results for input(s): HGBA1C in the last 72 hours. Fasting Lipid Panel No results for input(s): CHOL, HDL, LDLCALC, TRIG, CHOLHDL, LDLDIRECT in the last 72 hours. Thyroid Function Tests No results for input(s): TSH, T4TOTAL, T3FREE, THYROIDAB in the last 72 hours.  Invalid input(s): FREET3  Other results:   Imaging    CARDIAC CATHETERIZATION  Result Date: 03/22/2019  Mid LM to Prox LAD lesion is 80% stenosed with 65% stenosed side branch in Ramus. Prox LAD to Mid LAD lesion is 70% stenosed.  A drug-eluting stent was successfully placed from the LEFT MAIN into the proximal LAD using a SYNERGY XD 3.0X38. = The stent was upsized to 4.3 mm in the left main and 3.3 mm in the LAD  Post intervention, there is a 0% residual stenosis in the LEFT MAIN and LAD.  Post PTCA intervention, the side branch was reduced to 10% residual stenosis.  Ramus-1 lesion is 70% stenosed. Post PTCA 2.0 mm balloon intervention, there is a 35% residual stenosis.  Ramus-2 lesion is 75% stenosed. This lesion was not successfully crossed. Post intervention, there is a 70% residual stenosis.  1st Diag lesion is 90% stenosed.  Dist LAD lesion is 99% stenosed. Post intervention (simply with the guidewire advanced to the distal LAD, the vessel recanalized), there is a 80% residual stenosis.  A drug-eluting stent was successfully placed using a SYNERGY XD 3.0X38.  SUMMARY  Successful atherectomy based PCI of LEFT MAIN-PROXIMAL LAD with placement of 3.0 mm x 38 mm Synergy DES stent postdilated to 4.3 mm in the LEFT MAIN and 3.3 mm in  the LAD.  Balloon angioplasty only of the ostial and proximal ramus intermedius (unable to pass one 0 mm balloon beyond the lesion) RECOMMENDATIONS  Patient be transferred back to CCU.  We have applied FemoStop on the left femoral access site which appears to be well controlled.  Plan will be to maintain FemoStop at low pressure for roughly 1 hour and  then begin to wean off.  Would consider evaluation and treatment of the RCA lesions at a later date, but would allow for time to recover from this procedure over the weekend.  Given concern for possible blood loss during sheath exchange, we have written for 1 unit transfusion PRBC. Bryan Lemma, MD    Medications:     Scheduled Medications: . sodium chloride   Intravenous Once  . aspirin  81 mg Oral Daily  . Chlorhexidine Gluconate Cloth  6 each Topical Daily  . clopidogrel  75 mg Oral Daily  . digoxin  0.125 mg Oral Daily  . furosemide  40 mg Intravenous Once  . insulin aspart  0-15 Units Subcutaneous Q4H  . ivabradine  5 mg Oral BID WC  . mouth rinse  15 mL Mouth Rinse BID  . rosuvastatin  20 mg Oral q1800  . sodium chloride flush  10-40 mL Intracatheter Q12H  . sodium chloride flush  3 mL Intravenous Q12H  . spironolactone  25 mg Oral Daily    Infusions: . sodium chloride    . sodium chloride    . sodium chloride 10 mL/hr at 03/23/19 0000    PRN Medications: sodium chloride, sodium chloride, sodium chloride, acetaminophen, ondansetron (ZOFRAN) IV, ondansetron **OR** [DISCONTINUED] ondansetron (ZOFRAN) IV, Resource ThickenUp Clear, sodium chloride flush, sodium chloride flush    Assessment/Plan   1. Acute on chronic systolic CHF:  Initial cardiogenic shock.  Prior echo in 2018 with EF 30-35%, now echo with EF down to 10-15% with severe RV dysfunction. Ischemic cardiomyopathy based on cath.  No ACS this admission (HS-TnI in the 100s range with no trend).  Initially on milrinone, now off.  Remains in sinus tachy with soft BP but  has had adequate co-ox.  However, early am co-ox lower today at 50%.  CVP 11.  Creatinine 0.68.  - Lasix 40 mg IV x 1 this morning.  - Recheck co-ox now that he is awake/alert.  If remains low, would probably add low dose norepinephrine given persistently soft BP.  - Continue digoxin 0.125 daily.   - Continue spironolactone to 25 mg daily.  - No ARB/Entresto with low BP.  - Continue ivabradine 5 mg bid.  2. ID: Initial concern for RLL PNA on CT chest with septic shock. However, afebrile and WBCs only mildly elevated. Cultures remains negative. Afebrile  -  Now off abx.   3. Elevated LFTs: Suspect due to shock liver with hypotension.   - LFTs coming down.    4. H/o CVA: Residual left-sided weakness as well as some cognitive dysfunction per notes.    - He continues on Plavix.  - Continue statin.   5. Hyponatremia: Sodium 127. Restrict free water.    6. PAD: Moderate RLE vascular disease by ABIs, left normal.  7. CAD: Severe 3VD on cath. He underwent atherectomy/stenting left main into LAD and PTCA of ramus on 2/11.  - Continue ASA/Plavix.  - Continue atorvastatin.  - Will aim for PCI to PLV, flow wire ostial RCA on Monday.  Think this can be done without Impella support (had difficult time with Impella placement Friday).  8. Deconditioning: Will need SNF. Needs PT.   Length of Stay: 10  Marca Ancona, MD  03/23/2019, 7:38 AM  Advanced Heart Failure Team Pager [phone] (M-F; 7a - 4p)  Please contact [facility] for night-coverage after hours (4p -7a ) and weekends on [email or web address]  Repeat co-ox 61.5%.  No changes.   Marca Ancona  03/23/2019  10:28 AM

## 2019-03-23 NOTE — Plan of Care (Signed)
Shift Summary;  Stable during shift. Alert, interactive, forgetful/delayed responses (baseline s/p stroke).   BP soft, SBP 80-90s, MAP > 65. No change in mentation, warm to touch. Discussed with cards fellow. Continue to trend/monitor.   Bilat femoral sites stable, slight petechia L suprapubic/inner thigh, soft. Bilat LE pulses doppler.   Q 2 hr turns. Prevalon boots. Urinary incontinence, primofit in use with some leaking, frequent checks and cleaning/changing as needed.   Taking po but needs encouragement to drink slowly, occasional cough with drinking, responds well to reminds/correct positioning.   Weaned oxygen to RA with stable sats except for frequent, very brief desatting d/t sleep apnea (pt states he has been told to wear a machine but does not), kept on 1 l n/c for remainder of night.   Problem: Education: Goal: Knowledge of General Education information will improve Description: Including pain rating scale, medication(s)/side effects and non-pharmacologic comfort measures Outcome: Progressing   Problem: Clinical Measurements: Goal: Ability to maintain clinical measurements within normal limits will improve Outcome: Progressing Goal: Will remain free from infection Outcome: Progressing Goal: Diagnostic test results will improve Outcome: Progressing Goal: Respiratory complications will improve Outcome: Progressing   Problem: Activity: Goal: Risk for activity intolerance will decrease Outcome: Progressing   Problem: Coping: Goal: Level of anxiety will decrease Outcome: Progressing   Problem: Pain Managment: Goal: General experience of comfort will improve Outcome: Progressing

## 2019-03-24 LAB — COOXEMETRY PANEL
Carboxyhemoglobin: 1.9 % — ABNORMAL HIGH (ref 0.5–1.5)
Methemoglobin: 1 % (ref 0.0–1.5)
O2 Saturation: 63.4 %
Total hemoglobin: 12.5 g/dL (ref 12.0–16.0)

## 2019-03-24 LAB — CBC WITH DIFFERENTIAL/PLATELET
Abs Immature Granulocytes: 0.04 10*3/uL (ref 0.00–0.07)
Basophils Absolute: 0 10*3/uL (ref 0.0–0.1)
Basophils Relative: 0 %
Eosinophils Absolute: 0.1 10*3/uL (ref 0.0–0.5)
Eosinophils Relative: 1 %
HCT: 36.7 % — ABNORMAL LOW (ref 39.0–52.0)
Hemoglobin: 12.2 g/dL — ABNORMAL LOW (ref 13.0–17.0)
Immature Granulocytes: 0 %
Lymphocytes Relative: 13 %
Lymphs Abs: 1.3 10*3/uL (ref 0.7–4.0)
MCH: 30.3 pg (ref 26.0–34.0)
MCHC: 33.2 g/dL (ref 30.0–36.0)
MCV: 91.3 fL (ref 80.0–100.0)
Monocytes Absolute: 1.1 10*3/uL — ABNORMAL HIGH (ref 0.1–1.0)
Monocytes Relative: 11 %
Neutro Abs: 7.8 10*3/uL — ABNORMAL HIGH (ref 1.7–7.7)
Neutrophils Relative %: 75 %
Platelets: 196 10*3/uL (ref 150–400)
RBC: 4.02 MIL/uL — ABNORMAL LOW (ref 4.22–5.81)
RDW: 13.7 % (ref 11.5–15.5)
WBC: 10.4 10*3/uL (ref 4.0–10.5)
nRBC: 0 % (ref 0.0–0.2)

## 2019-03-24 LAB — GLUCOSE, CAPILLARY
Glucose-Capillary: 100 mg/dL — ABNORMAL HIGH (ref 70–99)
Glucose-Capillary: 108 mg/dL — ABNORMAL HIGH (ref 70–99)
Glucose-Capillary: 133 mg/dL — ABNORMAL HIGH (ref 70–99)
Glucose-Capillary: 167 mg/dL — ABNORMAL HIGH (ref 70–99)
Glucose-Capillary: 209 mg/dL — ABNORMAL HIGH (ref 70–99)
Glucose-Capillary: 252 mg/dL — ABNORMAL HIGH (ref 70–99)
Glucose-Capillary: 88 mg/dL (ref 70–99)

## 2019-03-24 LAB — BASIC METABOLIC PANEL
Anion gap: 6 (ref 5–15)
BUN: 14 mg/dL (ref 8–23)
CO2: 29 mmol/L (ref 22–32)
Calcium: 8.6 mg/dL — ABNORMAL LOW (ref 8.9–10.3)
Chloride: 94 mmol/L — ABNORMAL LOW (ref 98–111)
Creatinine, Ser: 0.56 mg/dL — ABNORMAL LOW (ref 0.61–1.24)
GFR calc Af Amer: >60 mL/min (ref >60)
GFR calc non Af Amer: >60 mL/min (ref >60)
Glucose, Bld: 94 mg/dL (ref 70–99)
Potassium: 4.1 mmol/L (ref 3.5–5.1)
Sodium: 129 mmol/L — ABNORMAL LOW (ref 135–145)

## 2019-03-24 LAB — HEMOGLOBIN AND HEMATOCRIT, BLOOD
HCT: 38.6 % — ABNORMAL LOW (ref 39.0–52.0)
Hemoglobin: 12.5 g/dL — ABNORMAL LOW (ref 13.0–17.0)

## 2019-03-24 MED ORDER — ENOXAPARIN SODIUM 40 MG/0.4ML ~~LOC~~ SOLN
40.0000 mg | SUBCUTANEOUS | Status: DC
  Administered 2019-03-24 – 2019-03-30 (×6): 40 mg via SUBCUTANEOUS
  Filled 2019-03-24 (×6): qty 0.4

## 2019-03-24 MED ORDER — FUROSEMIDE 10 MG/ML IJ SOLN
8.0000 mg/h | INTRAVENOUS | Status: AC
  Administered 2019-03-24: 8 mg/h via INTRAVENOUS
  Filled 2019-03-24: qty 25

## 2019-03-24 NOTE — Progress Notes (Signed)
Patient ID: Matthew Mccormick, male   DOB: 1955-11-04, 64 y.o.   MRN: [id number] P    Advanced Heart Failure Rounding Note  PCP-Cardiologist: Kate Sable, MD   Subjective:    Was quite lethargic this am. Now more alert. Not eating much. Co-ox 63% SBP chronically 80s-90s with HR 80-90s.    Denies pain, no orthopnea or PND. No CP.   2/11: Atherectomy/DES to LM/proximal LAD and PTCA ramus, protected by Impella.  Impella removed after case with hematoma left groin.   Objective:   Weight Range: 104.3 kg Body mass index is 31.19 kg/m.   Vital Signs:   Temp:  [97.4 F (36.3 C)-97.8 F (36.6 C)] 97.4 F (36.3 C) (02/13 0801) Pulse Rate:  [82-104] 86 (02/13 0900) Resp:  [10-26] 14 (02/13 0900) BP: (78-119)/(52-99) 91/63 (02/13 0900) SpO2:  [92 %-100 %] 94 % (02/13 0900) Weight:  [104.3 kg] 104.3 kg (02/13 0400) Last BM Date: 03/19/19  Weight change: Filed Weights   03/22/19 0500 03/23/19 0100 03/24/19 0400  Weight: 102.4 kg 105.1 kg 104.3 kg    Intake/Output:   Intake/Output Summary (Last 24 hours) at 03/24/2019 1110 Last data filed at 03/24/2019 0600 Gross per 24 hour  Intake 927.36 ml  Output 1950 ml  Net -1022.64 ml      Physical Exam   General:  Weak. Chronically-ill appearing. No resp difficulty HEENT: normal Neck: supple. JVP to jaw Carotids 2+ bilat; no bruits. No lymphadenopathy or thryomegaly appreciated. Cor: PMI nondisplaced. Regular rate & rhythm. No rubs, gallops or murmurs. Lungs: clear Abdomen: soft, nontender, nondistended. No hepatosplenomegaly. No bruits or masses. Good bowel sounds. Extremities: no cyanosis, clubbing, rash, 2-3+ edema Groin site ok  Neuro: alert & orientedx3, cranial nerves grossly intact. moves all 4 extremities w/o difficulty. Affect pleasant   Telemetry   Sinus 80-100 (personally reviewed).   Labs    CBC Recent Labs    03/21/19 1755 03/22/19 1023 03/23/19 0358 03/24/19 0521  WBC 10.6*   < > 9.9 10.4  NEUTROABS  7.9*  --   --  7.8*  HGB 13.9   < > 13.7 12.2*  HCT 42.8   < > 42.3 36.7*  MCV 94.5   < > 94.8 91.3  PLT 202   < > 207 196   < > = values in this interval not displayed.   Basic Metabolic Panel Recent Labs    03/23/19 0358 03/24/19 0521  NA 127* 129*  K 4.9 4.1  CL 93* 94*  CO2 26 29  GLUCOSE 173* 94  BUN 21 14  CREATININE 0.68 0.56*  CALCIUM 8.4* 8.6*   Liver Function Tests No results for input(s): AST, ALT, ALKPHOS, BILITOT, PROT, ALBUMIN in the last 72 hours. No results for input(s): LIPASE, AMYLASE in the last 72 hours. Cardiac Enzymes No results for input(s): CKTOTAL, CKMB, CKMBINDEX, TROPONINI in the last 72 hours.  BNP: BNP (last 3 results) Recent Labs    03/14/19 1832  BNP 1,338.6*    ProBNP (last 3 results) No results for input(s): PROBNP in the last 8760 hours.   D-Dimer No results for input(s): DDIMER in the last 72 hours. Hemoglobin A1C No results for input(s): HGBA1C in the last 72 hours. Fasting Lipid Panel No results for input(s): CHOL, HDL, LDLCALC, TRIG, CHOLHDL, LDLDIRECT in the last 72 hours. Thyroid Function Tests No results for input(s): TSH, T4TOTAL, T3FREE, THYROIDAB in the last 72 hours.  Invalid input(s): FREET3  Other results:   Imaging  No results found.   Medications:     Scheduled Medications: . sodium chloride   Intravenous Once  . aspirin  81 mg Oral Daily  . Chlorhexidine Gluconate Cloth  6 each Topical Daily  . clopidogrel  75 mg Oral Daily  . digoxin  0.125 mg Oral Daily  . insulin aspart  0-15 Units Subcutaneous Q4H  . ivabradine  5 mg Oral BID WC  . mouth rinse  15 mL Mouth Rinse BID  . rosuvastatin  20 mg Oral q1800  . sodium chloride flush  10-40 mL Intracatheter Q12H  . sodium chloride flush  3 mL Intravenous Q12H  . spironolactone  25 mg Oral Daily    Infusions: . sodium chloride    . sodium chloride    . sodium chloride 10 mL/hr at 03/23/19 1300    PRN Medications: sodium chloride, sodium  chloride, sodium chloride, acetaminophen, ondansetron (ZOFRAN) IV, ondansetron **OR** [DISCONTINUED] ondansetron (ZOFRAN) IV, Resource ThickenUp Clear, sodium chloride flush, sodium chloride flush    Assessment/Plan   1. Acute on chronic systolic CHF:  Initial cardiogenic shock.  Prior echo in 2018 with EF 30-35%, now echo with EF down to 10-15% with severe RV dysfunction. Ischemic cardiomyopathy based on cath.  No ACS this admission (HS-TnI in the 100s range with no trend).  Initially on milrinone, now off.  Remains in sinus with soft BP but has had adequate co-ox aat 63%.Remains markedly volume overloaded on exam   - Will start lasix gtt at 8  - Continue digoxin 0.125 daily.   - Continue spironolactone to 25 mg daily.  - No ARB/Entresto with low BP.  - Continue ivabradine 5 mg bid.  2. ID: Initial concern for RLL PNA on CT chest with septic shock. However, afebrile and WBCs only mildly elevated. Cultures remains negative. Afebrile  -  Now off abx.   - No change 3. Elevated LFTs: Suspect due to shock liver with hypotension.   - LFTs coming down.    4. H/o CVA: Residual left-sided weakness as well as some cognitive dysfunction per notes.    - He continues on Plavix.  - Continue statin.   5. Hyponatremia: Sodium 127-> 129. Restrict free water.    6. PAD: Moderate RLE vascular disease by ABIs, left normal.  7. CAD: Severe 3VD on cath. He underwent atherectomy/stenting left main into LAD and PTCA of ramus on 2/11.  - Continue ASA/Plavix.  - Continue atorvastatin.  - Possible PCI to PLV, flow wire ostial RCA on Monday if clinically improved per Dr. Shirlee Latch.  Likely can be done without Impella support (had difficult time with Impella placement Friday).  8. Deconditioning: Will need SNF. Needs PT.   Length of Stay: 55  Arvilla Meres, MD  03/24/2019, 11:10 AM  Advanced Heart Failure Team Pager [phone] (M-F; 7a - 4p)  Please contact [facility] for night-coverage after hours (4p -7a  ) and weekends on [email or web address]

## 2019-03-24 NOTE — Plan of Care (Signed)
  Problem: Education: Goal: Knowledge of General Education information will improve Description: Including pain rating scale, medication(s)/side effects and non-pharmacologic comfort measures Outcome: Progressing   Problem: Clinical Measurements: Goal: Respiratory complications will improve Outcome: Progressing Goal: Cardiovascular complication will be avoided Outcome: Progressing   Problem: Activity: Goal: Risk for activity intolerance will decrease Outcome: Not Progressing Note: Pt unable to stand despite assistance of several rn's.

## 2019-03-24 NOTE — Progress Notes (Signed)
PROGRESS NOTE    LADARIOUS Mccormick  VHQ:[id number] DOB: [birth year] DOA: 03/13/2019 PCP: Monico Blitz, MD   Brief Narrative: Patient is a 64 year old male with history of atrial fibrillation, diabetes type 2, hypertension, thombotic stroke who presented at [facility] hospital initially with several days of weakness, malaise, nausea, vomiting.  He was hypotensive on presentation.  He was suspected to have  cardiogenic shock and was transferred to [facility] for further management. Heart failure team is following.  He underwent cardiac cath on 03/19/2019 with finding of multivessel disease.   Underwent atherectomy based PCI of left main-proximal LAD with placement of DES, balloon angioplasty of ostial and proximal ramus intermedius on 03/22/19.  Assessment & Plan:   Active Problems:   Sepsis ([place])   Pressure injury of skin   Ischemic hepatitis   Cardiogenic shock:  Cardiology team following.  Started on Lasix,spironolactone,digoxin,losartan,ivabradine.Currently only on spironolactone, digoxin, ivabradine. Prior echo in 2018 showed ejection fraction of 30 to 35%, current echo showing EF of 10 to 15% with severe right ventricular dysfunction. Underwent right/left heart catheterization on 03/19/2019 with finding of multivessel disease. He is a poor candidate for CABG. Underwent atherectomy based PCI of left main-proximal LAD with placement of DES, balloon angioplasty of ostial and proximal ramus intermedius on 03/22/19. Plan for PCI to PLV, FloWire ostial RCA on Monday.  Acute metabolic encephalopathy: Currently alert and oriented.  Patient lives by himself  in [place].  Acute hypoxemic respiratory failure: Present on admission.  Suspected to be from acute pulmonary edema from heart failure.  Chest x-ray did not show pneumonia.  CT chest was concerning for right lower lobe pneumonia but he is afebrile without leukocytosis.  Not on antibiotics.    Hypertension: Currently blood pressure soft.  Continue  current regimen  Diabetes mellitus 2: Continue sliding-scale insulin.  On insulin at home  History of CVA: Continue Plavix.  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 On statin  Elevated liver enzymes: Secondary to combination of hepatic condition, shock liver.  Liver enzymes trending down.  Suspected peripheral artery disease: Has a bilateral small ulcerations on bilateral legs.Present on admission.ABI showed moderate right lower  extremity arterial disease .  Will recommend follow-up with vascular surgery as an outpatient.Conitnue plavix.  On statin.  Hyponatremia:Likely econdary to hypervolemic hyponatremia from CHF.  Continue to monitor.  Sodium is 129 today.  Continue to restrict fluid.  Deconditioning/debility: Seen by physical therapy and recommended skilled nursing facility on discharge.  Pressure Injury 03/13/19 Sacrum Stage 1 -  Intact skin with non-blanchable redness of a localized area usually over a bony prominence. (Active)  03/13/19 1700  Location: Sacrum  Location Orientation:   Staging: Stage 1 -  Intact skin with non-blanchable redness of a localized area usually over a bony prominence.  Wound Description (Comments):   Present on Admission: Yes              DVT prophylaxis:Lovenox Code Status: Full Family Communication: Cousin on phone on 03/23/19 Disposition Plan: Patient is from home.  PT/oT recommending SnF.  He is not clinically ready for discharge and cardiology hasnt cleared for dc.  Plan for cath on 03/26/2019  Consultants: Cardiology  Procedures:None  Antimicrobials:  Anti-infectives (From admission, onward)   Start     Dose/Rate Route Frequency Ordered Stop   03/14/19 1200  cefTRIAXone (ROCEPHIN) 2 g in sodium chloride 0.9 % 100 mL IVPB  Status:  Discontinued     2 g 200 mL/hr over  30 Minutes Intravenous Every 24 hours 03/14/19 0951 03/15/19 0954   03/14/19 1100  azithromycin (ZITHROMAX) 500 mg in sodium  chloride 0.9 % 250 mL IVPB  Status:  Discontinued     500 mg 250 mL/hr over 60 Minutes Intravenous Every 24 hours 03/14/19 0951 03/15/19 0954   03/14/19 0000  vancomycin (VANCOREADY) IVPB 1250 mg/250 mL  Status:  Discontinued     1,250 mg 166.7 mL/hr over 90 Minutes Intravenous Every 12 hours 03/13/19 1158 03/14/19 0951   03/13/19 2000  ceFEPIme (MAXIPIME) 2 g in sodium chloride 0.9 % 100 mL IVPB  Status:  Discontinued     2 g 200 mL/hr over 30 Minutes Intravenous Every 8 hours 03/13/19 1058 03/14/19 0951   03/13/19 1030  vancomycin (VANCOCIN) IVPB 1000 mg/200 mL premix  Status:  Discontinued     1,000 mg 200 mL/hr over 60 Minutes Intravenous Every 1 hr x 2 03/13/19 1001 03/13/19 1002   03/13/19 1030  vancomycin (VANCOREADY) IVPB 2000 mg/400 mL     2,000 mg 200 mL/hr over 120 Minutes Intravenous  Once 03/13/19 1002 03/13/19 1343   03/13/19 1000  ceFEPIme (MAXIPIME) 2 g in sodium chloride 0.9 % 100 mL IVPB     2 g 200 mL/hr over 30 Minutes Intravenous  Once 03/13/19 0952 03/13/19 1103   03/13/19 1000  metroNIDAZOLE (FLAGYL) IVPB 500 mg     500 mg 100 mL/hr over 60 Minutes Intravenous  Once 03/13/19 0952 03/13/19 1141   03/13/19 1000  vancomycin (VANCOCIN) IVPB 1000 mg/200 mL premix  Status:  Discontinued     1,000 mg 200 mL/hr over 60 Minutes Intravenous  Once 03/13/19 5697 03/13/19 1001      Subjective:  Patient seen and examined at the bedside this morning.  Hemodynamically stable.  No chest pain or shortness of breath.  Blood pressure still soft but stable. Objective: Vitals:   03/24/19 0600 03/24/19 0700 03/24/19 0801 03/24/19 0900  BP: 94/62 (!) 87/52  91/63  Pulse: 82 83  86  Resp: 15 11  14   Temp:   (!) 97.4 F (36.3 C)   TempSrc:   Axillary   SpO2: 97% 100%  94%  Weight:      Height:        Intake/Output Summary (Last 24 hours) at 03/24/2019 0927 Last data filed at 03/24/2019 0600 Gross per 24 hour  Intake 927.36 ml  Output 1950 ml  Net -1022.64 ml   Filed  Weights   03/22/19 0500 03/23/19 0100 03/24/19 0400  Weight: 102.4 kg 105.1 kg 104.3 kg    Examination: .   General exam: Generalized weakness, chronically ill looking.   Respiratory system: Diminished air sounds on the bases, no wheezes or crackles  Cardiovascular system: Sinus tachy. No murmurs, rubs, gallops or clicks. Gastrointestinal system: Abdomen is nondistended, soft and nontender. No organomegaly or masses felt. Normal bowel sounds heard. Central nervous system: Alert and oriented. No focal neurological deficits. Extremities: trace lower extremity edema, no clubbing ,no cyanosis, distal peripheral pulses palpable. Skin: Ulcerations on bilateral legs covered with dressings    Data Reviewed: I have personally reviewed following labs and imaging studies  CBC: Recent Labs  Lab 03/21/19 1522 03/21/19 1522 03/21/19 1755 03/21/19 1755 03/22/19 1023 03/22/19 1156 03/22/19 2008 03/23/19 0358 03/24/19 0521  WBC 10.8*  --  10.6*  --   --   --  12.1* 9.9 10.4  NEUTROABS  --   --  7.9*  --   --   --   --   --  7.8*  HGB 13.9   < > 13.9   < > 15.3 15.0 13.5 13.7 12.2*  HCT 42.1   < > 42.8   < > 45.0 44.0 41.7 42.3 36.7*  MCV 94.0  --  94.5  --   --   --  93.9 94.8 91.3  PLT 203  --  202  --   --   --  198 207 196   < > = values in this interval not displayed.   Basic Metabolic Panel: Recent Labs  Lab 03/18/19 0509 03/18/19 0509 03/19/19 0359 03/19/19 1245 03/21/19 0603 03/21/19 0603 03/21/19 1816 03/21/19 1816 03/22/19 0500 03/22/19 1023 03/22/19 1156 03/22/19 2008 03/23/19 0358 03/24/19 0521  NA 129*   < > 127*   < > 130*   < > 129*   < > 130* 105* 127*  --  127* 129*  K 3.5   < > 4.0   < > 4.3   < > 4.5   < > 4.8 3.7 5.1  --  4.9 4.1  CL 93*   < > 85*   < > 92*  --  90*  --  87*  --   --   --  93* 94*  CO2 29   < > 29   < > 29  --  27  --  28  --   --   --  26 29  GLUCOSE 155*   < > 154*   < > 186*  --  163*  --  160*  --   --   --  173* 94  BUN 14   < > 12    < > 12  --  13  --  17  --   --   --  21 14  CREATININE 0.65   < > 0.68   < > 0.65   < > 0.64  --  0.74  --   --  0.76 0.68 0.56*  CALCIUM 7.8*   < > 8.2*   < > 8.3*  --  8.4*  --  8.8*  --   --   --  8.4* 8.6*  MG 1.7  --  1.8  --   --   --   --   --   --   --   --   --   --   --    < > = values in this interval not displayed.   GFR: Estimated Creatinine Clearance: 118 mL/min (A) (by C-G formula based on SCr of 0.56 mg/dL (L)). Liver Function Tests: Recent Labs  Lab 03/18/19 0509 03/19/19 0359 03/20/19 0410 03/21/19 0603  AST 480* 400* 203* 102*  ALT 957* 722* 535* 372*  ALKPHOS 231* 206* 173* 157*  BILITOT 2.6* 3.0* 3.0* 2.2*  PROT 5.4* 5.3* 5.4* 5.5*  ALBUMIN 2.5* 2.4* 2.3* 2.4*   No results for input(s): LIPASE, AMYLASE in the last 168 hours. No results for input(s): AMMONIA in the last 168 hours. Coagulation Profile: Recent Labs  Lab 03/22/19 0500  INR 1.3*   Cardiac Enzymes: No results for input(s): CKTOTAL, CKMB, CKMBINDEX, TROPONINI in the last 168 hours. BNP (last 3 results) No results for input(s): PROBNP in the last 8760 hours. HbA1C: No results for input(s): HGBA1C in the last 72 hours. CBG: Recent Labs  Lab 03/23/19 1538 03/23/19 2119 03/24/19 0013 03/24/19 0508 03/24/19 0759  GLUCAP 220* 184* 167* 88 100*   Lipid Profile: No results for input(s):  CHOL, HDL, LDLCALC, TRIG, CHOLHDL, LDLDIRECT in the last 72 hours. Thyroid Function Tests: No results for input(s): TSH, T4TOTAL, FREET4, T3FREE, THYROIDAB in the last 72 hours. Anemia Panel: No results for input(s): VITAMINB12, FOLATE, FERRITIN, TIBC, IRON, RETICCTPCT in the last 72 hours. Sepsis Labs: No results for input(s): PROCALCITON, LATICACIDVEN in the last 168 hours.  Recent Results (from the past 240 hour(s))  MRSA PCR Screening     Status: None   Collection Time: 03/21/19  7:03 PM   Specimen: Nasal Mucosa; Nasopharyngeal  Result Value Ref Range Status   MRSA by PCR NEGATIVE NEGATIVE Final     Comment:        The GeneXpert MRSA Assay (FDA approved for NASAL specimens only), is one component of a comprehensive MRSA colonization surveillance program. It is not intended to diagnose MRSA infection nor to guide or monitor treatment for MRSA infections. Performed at [facility], [address] [place], [place] [address]          Radiology Studies: No results found.      Scheduled Meds: . sodium chloride   Intravenous Once  . aspirin  81 mg Oral Daily  . Chlorhexidine Gluconate Cloth  6 each Topical Daily  . clopidogrel  75 mg Oral Daily  . digoxin  0.125 mg Oral Daily  . insulin aspart  0-15 Units Subcutaneous Q4H  . ivabradine  5 mg Oral BID WC  . mouth rinse  15 mL Mouth Rinse BID  . rosuvastatin  20 mg Oral q1800  . sodium chloride flush  10-40 mL Intracatheter Q12H  . sodium chloride flush  3 mL Intravenous Q12H  . spironolactone  25 mg Oral Daily   Continuous Infusions: . sodium chloride    . sodium chloride    . sodium chloride 10 mL/hr at 03/23/19 1300     LOS: 11 days    Time spent: 35 mins. More than 50% of that time was spent in counseling and/or coordination of care.      Burnadette Pop, MD [facility] P2/13/2021, 9:27 AM

## 2019-03-25 LAB — BASIC METABOLIC PANEL
Anion gap: 9 (ref 5–15)
BUN: 11 mg/dL (ref 8–23)
CO2: 29 mmol/L (ref 22–32)
Calcium: 8.4 mg/dL — ABNORMAL LOW (ref 8.9–10.3)
Chloride: 92 mmol/L — ABNORMAL LOW (ref 98–111)
Creatinine, Ser: 0.78 mg/dL (ref 0.61–1.24)
GFR calc Af Amer: >60 mL/min (ref >60)
GFR calc non Af Amer: >60 mL/min (ref >60)
Glucose, Bld: 99 mg/dL (ref 70–99)
Potassium: 4.1 mmol/L (ref 3.5–5.1)
Sodium: 130 mmol/L — ABNORMAL LOW (ref 135–145)

## 2019-03-25 LAB — COOXEMETRY PANEL
Carboxyhemoglobin: 1.8 % — ABNORMAL HIGH (ref 0.5–1.5)
Methemoglobin: 1 % (ref 0.0–1.5)
O2 Saturation: 61.4 %
Total hemoglobin: 12.3 g/dL (ref 12.0–16.0)

## 2019-03-25 LAB — GLUCOSE, CAPILLARY
Glucose-Capillary: 95 mg/dL (ref 70–99)
Glucose-Capillary: 99 mg/dL (ref 70–99)
Glucose-Capillary: 185 mg/dL — ABNORMAL HIGH (ref 70–99)
Glucose-Capillary: 237 mg/dL — ABNORMAL HIGH (ref 70–99)
Glucose-Capillary: 152 mg/dL — ABNORMAL HIGH (ref 70–99)
Glucose-Capillary: 90 mg/dL (ref 70–99)

## 2019-03-25 MED ORDER — SODIUM CHLORIDE 0.9 % IV SOLN: 250.0000 mL | INTRAVENOUS | Status: DC | PRN

## 2019-03-25 MED ORDER — SODIUM CHLORIDE 0.9% FLUSH: 3.0000 mL | INTRAVENOUS | Status: DC | PRN

## 2019-03-25 MED ORDER — SODIUM CHLORIDE 0.9% FLUSH
3.0000 mL | Freq: Two times a day (BID) | INTRAVENOUS | Status: DC
  Administered 2019-03-25: 3 mL via INTRAVENOUS

## 2019-03-25 MED ORDER — SODIUM CHLORIDE 0.9 % IV SOLN: INTRAVENOUS | Status: DC

## 2019-03-25 MED ORDER — ASPIRIN 81 MG PO CHEW
81.0000 mg | CHEWABLE_TABLET | ORAL | Status: AC
  Administered 2019-03-26: 81 mg via ORAL
  Filled 2019-03-25: qty 1

## 2019-03-25 NOTE — Progress Notes (Signed)
PROGRESS NOTE    Matthew Mccormick  GMW:[id number] DOB: 08/26/1955 DOA: 03/13/2019 PCP: Kirstie Peri, MD   Brief Narrative: Patient is a 64 year old male with history of atrial fibrillation, diabetes type 2, hypertension, thombotic stroke who presented at [facility] hospital initially with several days of weakness, malaise, nausea, vomiting.  He was hypotensive on presentation.  He was suspected to have  cardiogenic shock and was transferred to [facility] for further management. Heart failure team is following.  He underwent cardiac cath on 03/19/2019 with finding of multivessel disease.   Underwent atherectomy based PCI of left main-proximal LAD with placement of DES, balloon angioplasty of ostial and proximal ramus intermedius on 03/22/19. Plan for PCI to PLV, Flow Wire ostial RCA on 03/26/2019.  Assessment & Plan:   Active Problems:   Sepsis ([facility])   Pressure injury of skin   Ischemic hepatitis   Cardiogenic shock:  Cardiology team following.  Started on Lasix,spironolactone,digoxin,losartan,ivabradine.Currently on lasix gtt, spironolactone, digoxin, ivabradine. Prior echo in 2018 showed ejection fraction of 30 to 35%, current echo showing EF of 10 to 15% with severe right ventricular dysfunction. Underwent right/left heart catheterization on 03/19/2019 with finding of multivessel disease. He is a poor candidate for CABG. Underwent atherectomy based PCI of left main-proximal LAD with placement of DES, balloon angioplasty of ostial and proximal ramus intermedius on 03/22/19. Plan for PCI to PLV, Flow Wire ostial RCA on Monday.  Acute metabolic encephalopathy: Currently alert and oriented.  Patient lives by himself  in [place].  Acute hypoxemic respiratory failure: Present on admission.  Suspected to be from acute pulmonary edema from heart failure.  Chest x-ray did not show pneumonia.  CT chest was concerning for right lower lobe pneumonia but he is afebrile without leukocytosis.  Not on antibiotics.     Hypertension: Currently blood pressure soft.  Continue current regimen  Diabetes mellitus 2: Continue sliding-scale insulin.  On insulin at home  History of CVA: Continue Plavix.  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 On statin  Elevated liver enzymes: Secondary to combination of hepatic condition, shock liver.  Liver enzymes trending down.  Suspected peripheral artery disease: Has a bilateral small ulcerations on bilateral legs.Present on admission.ABI showed moderate right lower  extremity arterial disease .  Will recommend follow-up with vascular surgery as an outpatient.Conitnue plavix.  On statin.  Hyponatremia:Likely econdary to hypervolemic hyponatremia from CHF.  Continue to monitor.  Sodium is 130 today.  Continue to restrict fluid.  Deconditioning/debility: Seen by physical therapy and recommended skilled nursing facility on discharge.  Pressure Injury 03/13/19 Sacrum Stage 1 -  Intact skin with non-blanchable redness of a localized area usually over a bony prominence. (Active)  03/13/19 1700  Location: Sacrum  Location Orientation:   Staging: Stage 1 -  Intact skin with non-blanchable redness of a localized area usually over a bony prominence.  Wound Description (Comments):   Present on Admission: Yes              DVT prophylaxis:Lovenox Code Status: Full Family Communication: Cousin on phone on 03/23/19 Disposition Plan: Patient is from home.  PT/oT recommending SnF.  He is not clinically ready for discharge and cardiology hasnt cleared for dc.  Plan for cath on 03/26/2019  Consultants: Cardiology  Procedures:None  Antimicrobials:  Anti-infectives (From admission, onward)   Start     Dose/Rate Route Frequency Ordered Stop   03/14/19 1200  cefTRIAXone (ROCEPHIN) 2 g in sodium chloride 0.9 % 100 mL IVPB  Status:  Discontinued     2 g 200 mL/hr over 30 Minutes Intravenous Every 24 hours 03/14/19 0951 03/15/19 0954    03/14/19 1100  azithromycin (ZITHROMAX) 500 mg in sodium chloride 0.9 % 250 mL IVPB  Status:  Discontinued     500 mg 250 mL/hr over 60 Minutes Intravenous Every 24 hours 03/14/19 0951 03/15/19 0954   03/14/19 0000  vancomycin (VANCOREADY) IVPB 1250 mg/250 mL  Status:  Discontinued     1,250 mg 166.7 mL/hr over 90 Minutes Intravenous Every 12 hours 03/13/19 1158 03/14/19 0951   03/13/19 2000  ceFEPIme (MAXIPIME) 2 g in sodium chloride 0.9 % 100 mL IVPB  Status:  Discontinued     2 g 200 mL/hr over 30 Minutes Intravenous Every 8 hours 03/13/19 1058 03/14/19 0951   03/13/19 1030  vancomycin (VANCOCIN) IVPB 1000 mg/200 mL premix  Status:  Discontinued     1,000 mg 200 mL/hr over 60 Minutes Intravenous Every 1 hr x 2 03/13/19 1001 03/13/19 1002   03/13/19 1030  vancomycin (VANCOREADY) IVPB 2000 mg/400 mL     2,000 mg 200 mL/hr over 120 Minutes Intravenous  Once 03/13/19 1002 03/13/19 1343   03/13/19 1000  ceFEPIme (MAXIPIME) 2 g in sodium chloride 0.9 % 100 mL IVPB     2 g 200 mL/hr over 30 Minutes Intravenous  Once 03/13/19 0952 03/13/19 1103   03/13/19 1000  metroNIDAZOLE (FLAGYL) IVPB 500 mg     500 mg 100 mL/hr over 60 Minutes Intravenous  Once 03/13/19 0952 03/13/19 1141   03/13/19 1000  vancomycin (VANCOCIN) IVPB 1000 mg/200 mL premix  Status:  Discontinued     1,000 mg 200 mL/hr over 60 Minutes Intravenous  Once 03/13/19 9147 03/13/19 1001      Subjective:  Patient seen and examined at the bedside this morning.  Hemodynamically stable.  No chest pain or shortness of breath.  Blood pressure still soft but stable.Denies any other complains  Objective: Vitals:   03/25/19 0400 03/25/19 0500 03/25/19 0600 03/25/19 0700  BP: (!) 86/48 91/69 (!) 90/55 99/64  Pulse: 83 80 82 86  Resp: 14 10 14 14   Temp:      TempSrc:      SpO2: 96% 96% 92% 100%  Weight:  102.2 kg    Height:        Intake/Output Summary (Last 24 hours) at 03/25/2019 0753 Last data filed at 03/25/2019 0700 Gross  per 24 hour  Intake 1160.65 ml  Output 6950 ml  Net -5789.35 ml   Filed Weights   03/23/19 0100 03/24/19 0400 03/25/19 0500  Weight: 105.1 kg 104.3 kg 102.2 kg    Examination: .    General exam: Generalized weakness, chronically ill looking Respiratory system: Bilateral diminished air sounds on the bases, no crackles or wheezes  cardiovascular system: S1 & S2 heard, RRR. No murmurs, rubs, gallops or clicks. Gastrointestinal system: Abdomen is nondistended, soft and nontender. No organomegaly or masses felt. Normal bowel sounds heard. Central nervous system: Alert and oriented.  Extremities: Trace edema, no clubbing ,no cyanosis Skin: Ulcerations on bilateral legs covered with dressings.  Data Reviewed: I have personally reviewed following labs and imaging studies  CBC: Recent Labs  Lab 03/21/19 1522 03/21/19 1522 03/21/19 1755 03/22/19 1023 03/22/19 1156 03/22/19 2008 03/23/19 0358 03/24/19 0521 03/24/19 1810  WBC 10.8*  --  10.6*  --   --  12.1* 9.9 10.4  --   NEUTROABS  --   --  7.9*  --   --   --   --  7.8*  --   HGB 13.9   < > 13.9   < > 15.0 13.5 13.7 12.2* 12.5*  HCT 42.1   < > 42.8   < > 44.0 41.7 42.3 36.7* 38.6*  MCV 94.0  --  94.5  --   --  93.9 94.8 91.3  --   PLT 203  --  202  --   --  198 207 196  --    < > = values in this interval not displayed.   Basic Metabolic Panel: Recent Labs  Lab 03/19/19 0359 03/19/19 1245 03/21/19 1816 03/21/19 1816 03/22/19 0500 03/22/19 0500 03/22/19 1023 03/22/19 1156 03/22/19 2008 03/23/19 0358 03/24/19 0521 03/25/19 0426  NA 127*   < > 129*   < > 130*   < > 105* 127*  --  127* 129* 130*  K 4.0   < > 4.5   < > 4.8   < > 3.7 5.1  --  4.9 4.1 4.1  CL 85*   < > 90*  --  87*  --   --   --   --  93* 94* 92*  CO2 29   < > 27  --  28  --   --   --   --  26 29 29   GLUCOSE 154*   < > 163*  --  160*  --   --   --   --  173* 94 99  BUN 12   < > 13  --  17  --   --   --   --  21 14 11   CREATININE 0.68   < > 0.64   < >  0.74  --   --   --  0.76 0.68 0.56* 0.78  CALCIUM 8.2*   < > 8.4*  --  8.8*  --   --   --   --  8.4* 8.6* 8.4*  MG 1.8  --   --   --   --   --   --   --   --   --   --   --    < > = values in this interval not displayed.   GFR: Estimated Creatinine Clearance: 116.8 mL/min (by C-G formula based on SCr of 0.78 mg/dL). Liver Function Tests: Recent Labs  Lab 03/19/19 0359 03/20/19 0410 03/21/19 0603  AST 400* 203* 102*  ALT 722* 535* 372*  ALKPHOS 206* 173* 157*  BILITOT 3.0* 3.0* 2.2*  PROT 5.3* 5.4* 5.5*  ALBUMIN 2.4* 2.3* 2.4*   No results for input(s): LIPASE, AMYLASE in the last 168 hours. No results for input(s): AMMONIA in the last 168 hours. Coagulation Profile: Recent Labs  Lab 03/22/19 0500  INR 1.3*   Cardiac Enzymes: No results for input(s): CKTOTAL, CKMB, CKMBINDEX, TROPONINI in the last 168 hours. BNP (last 3 results) No results for input(s): PROBNP in the last 8760 hours. HbA1C: No results for input(s): HGBA1C in the last 72 hours. CBG: Recent Labs  Lab 03/24/19 1121 03/24/19 1629 03/24/19 1959 03/24/19 2324 03/25/19 0323  GLUCAP 108* 209* 252* 133* 95   Lipid Profile: No results for input(s): CHOL, HDL, LDLCALC, TRIG, CHOLHDL, LDLDIRECT in the last 72 hours. Thyroid Function Tests: No results for input(s): TSH, T4TOTAL, FREET4, T3FREE, THYROIDAB in the last 72 hours. Anemia Panel: No results for input(s): VITAMINB12, FOLATE, FERRITIN, TIBC, IRON, RETICCTPCT in the last 72 hours. Sepsis Labs: No results for input(s): PROCALCITON, LATICACIDVEN in the  last 168 hours.  Recent Results (from the past 240 hour(s))  MRSA PCR Screening     Status: None   Collection Time: 03/21/19  7:03 PM   Specimen: Nasal Mucosa; Nasopharyngeal  Result Value Ref Range Status   MRSA by PCR NEGATIVE NEGATIVE Final    Comment:        The GeneXpert MRSA Assay (FDA approved for NASAL specimens only), is one component of a comprehensive MRSA colonization surveillance  program. It is not intended to diagnose MRSA infection nor to guide or monitor treatment for MRSA infections. Performed at [facility], [address] [place], [place] [address]          Radiology Studies: No results found.      Scheduled Meds: . sodium chloride   Intravenous Once  . aspirin  81 mg Oral Daily  . Chlorhexidine Gluconate Cloth  6 each Topical Daily  . clopidogrel  75 mg Oral Daily  . digoxin  0.125 mg Oral Daily  . enoxaparin (LOVENOX) injection  40 mg Subcutaneous Q24H  . insulin aspart  0-15 Units Subcutaneous Q4H  . ivabradine  5 mg Oral BID WC  . mouth rinse  15 mL Mouth Rinse BID  . rosuvastatin  20 mg Oral q1800  . sodium chloride flush  10-40 mL Intracatheter Q12H  . sodium chloride flush  3 mL Intravenous Q12H  . spironolactone  25 mg Oral Daily   Continuous Infusions: . sodium chloride    . sodium chloride    . sodium chloride 10 mL/hr at 03/24/19 1300  . furosemide (LASIX) infusion 8 mg/hr (03/24/19 1437)     LOS: 12 days    Time spent: 35 mins. More than 50% of that time was spent in counseling and/or coordination of care.      Burnadette Pop, MD [facility] P2/14/2021, 7:53 AM

## 2019-03-25 NOTE — Progress Notes (Signed)
Left lower leg dressing changed. Pt tolerated well. No complaints at this time.

## 2019-03-25 NOTE — Progress Notes (Signed)
Patient ID: Matthew Mccormick, male   DOB: Nov 28, 1955, 64 y.o.   MRN: [id number] P    Advanced Heart Failure Rounding Note  PCP-Cardiologist: Prentice Docker, MD   Subjective:    Lasix gtt started yesterday. Out almost 6L. Weight down 4 pounds. Co-ox 61%  Much more alert. Denies CP or SOB. CVP 7-8 (checked personally). Renal function stable. SBP 90-110   2/11: Atherectomy/DES to LM/proximal LAD and PTCA ramus, protected by Impella.  Impella removed after case with hematoma left groin.   Objective:   Weight Range: 102.2 kg Body mass index is 30.56 kg/m.   Vital Signs:   Temp:  [97.7 F (36.5 C)-98.4 F (36.9 C)] 97.7 F (36.5 C) (02/14 0757) Pulse Rate:  [80-102] 80 (02/14 0800) Resp:  [10-28] 17 (02/14 0800) BP: (85-121)/(46-87) 92/60 (02/14 0800) SpO2:  [92 %-100 %] 100 % (02/14 0800) Weight:  [102.2 kg] 102.2 kg (02/14 0500) Last BM Date: 03/19/19  Weight change: Filed Weights   03/23/19 0100 03/24/19 0400 03/25/19 0500  Weight: 105.1 kg 104.3 kg 102.2 kg    Intake/Output:   Intake/Output Summary (Last 24 hours) at 03/25/2019 0850 Last data filed at 03/25/2019 0800 Gross per 24 hour  Intake 1178.65 ml  Output 6950 ml  Net -5771.35 ml      Physical Exam   General:  Sitting up in bed. No resp difficulty More alert HEENT: normal Neck: supple. JVP 7-8 Carotids 2+ bilat; no bruits. No lymphadenopathy or thryomegaly appreciated. Cor: PMI nondisplaced. Regular rate & rhythm. No rubs, gallops or murmurs. Lungs: clear Abdomen: soft, nontender, nondistended. No hepatosplenomegaly. No bruits or masses. Good bowel sounds. Extremities: no cyanosis, clubbing, rash, 1-2+ edema R>L + dressings Neuro: alert & orientedx3, cranial nerves grossly intact. moves all 4 extremities w/o difficulty. Affect pleasant   Telemetry   Sinus 80s Personally reviewed  Labs    CBC Recent Labs    03/23/19 0358 03/23/19 0358 03/24/19 0521 03/24/19 1810  WBC 9.9  --  10.4  --     NEUTROABS  --   --  7.8*  --   HGB 13.7   < > 12.2* 12.5*  HCT 42.3   < > 36.7* 38.6*  MCV 94.8  --  91.3  --   PLT 207  --  196  --    < > = values in this interval not displayed.   Basic Metabolic Panel Recent Labs    40/98/11 0521 03/25/19 0426  NA 129* 130*  K 4.1 4.1  CL 94* 92*  CO2 29 29  GLUCOSE 94 99  BUN 14 11  CREATININE 0.56* 0.78  CALCIUM 8.6* 8.4*   Liver Function Tests No results for input(s): AST, ALT, ALKPHOS, BILITOT, PROT, ALBUMIN in the last 72 hours. No results for input(s): LIPASE, AMYLASE in the last 72 hours. Cardiac Enzymes No results for input(s): CKTOTAL, CKMB, CKMBINDEX, TROPONINI in the last 72 hours.  BNP: BNP (last 3 results) Recent Labs    03/14/19 1832  BNP 1,338.6*    ProBNP (last 3 results) No results for input(s): PROBNP in the last 8760 hours.   D-Dimer No results for input(s): DDIMER in the last 72 hours. Hemoglobin A1C No results for input(s): HGBA1C in the last 72 hours. Fasting Lipid Panel No results for input(s): CHOL, HDL, LDLCALC, TRIG, CHOLHDL, LDLDIRECT in the last 72 hours. Thyroid Function Tests No results for input(s): TSH, T4TOTAL, T3FREE, THYROIDAB in the last 72 hours.  Invalid input(s): FREET3  Other  results:   Imaging    No results found.   Medications:     Scheduled Medications: . sodium chloride   Intravenous Once  . aspirin  81 mg Oral Daily  . Chlorhexidine Gluconate Cloth  6 each Topical Daily  . clopidogrel  75 mg Oral Daily  . digoxin  0.125 mg Oral Daily  . enoxaparin (LOVENOX) injection  40 mg Subcutaneous Q24H  . insulin aspart  0-15 Units Subcutaneous Q4H  . ivabradine  5 mg Oral BID WC  . mouth rinse  15 mL Mouth Rinse BID  . rosuvastatin  20 mg Oral q1800  . sodium chloride flush  10-40 mL Intracatheter Q12H  . sodium chloride flush  3 mL Intravenous Q12H  . spironolactone  25 mg Oral Daily    Infusions: . sodium chloride    . sodium chloride    . sodium chloride 10  mL/hr at 03/25/19 0800  . furosemide (LASIX) infusion 8 mg/hr (03/25/19 0800)    PRN Medications: sodium chloride, sodium chloride, sodium chloride, acetaminophen, ondansetron (ZOFRAN) IV, ondansetron **OR** [DISCONTINUED] ondansetron (ZOFRAN) IV, Resource ThickenUp Clear, sodium chloride flush, sodium chloride flush    Assessment/Plan   1. Acute on chronic systolic CHF:  Initial cardiogenic shock.  Prior echo in 2018 with EF 30-35%, now echo with EF down to 10-15% with severe RV dysfunction. Ischemic cardiomyopathy based on cath.  No ACS this admission (HS-TnI in the 100s range with no trend).  Initially on milrinone, now off.  Remains in sinus with soft BP but has had adequate co-ox at 61%. Now on lasix gtt for marked volume overload. Weight down 4 pounds overnight. Renal function stable. Bp stable CVP 7-8  - Continue lasix gtt at 8 until this afternoon and then stop - Continue digoxin 0.125 daily.   - Continue spironolactone to 25 mg daily.  - No ARB/Entresto with low BP.  - Continue ivabradine 5 mg bid.  2. ID: Initial concern for RLL PNA on CT chest with septic shock. However, afebrile and WBCs only mildly elevated. Cultures remains negative. Remains afebrile -  Now off abx.   - No change 3. Elevated LFTs: Suspect due to shock liver with hypotension.   - Resolving 4. H/o CVA: Residual left-sided weakness as well as some cognitive dysfunction per notes.    - He continues on Plavix.  - Continue statin.   5. Hyponatremia: Sodium 127-> 129 -> 130. Restrict free water.    6. PAD: Moderate RLE vascular disease by ABIs, left normal.  7. CAD: Severe 3VD on cath. He underwent atherectomy/stenting left main into LAD and PTCA of ramus on 2/11.  - Continue ASA/Plavix.  - Continue atorvastatin.  - Possible PCI to PLV, flow wire ostial RCA when clinically improved per Dr. Aundra Dubin.  Likely can be done without Impella support (had difficult time with Impella placement Friday).  8. Deconditioning:  Will need SNF. Needs PT.   Length of Stay: [place], MD  03/25/2019, 8:50 AM  Advanced Heart Failure Team Pager [phone] (M-F; 7a - 4p)  Please contact [facility] for night-coverage after hours (4p -7a ) and weekends on [email or web address]

## 2019-03-26 ENCOUNTER — Encounter (HOSPITAL_COMMUNITY)
Admission: EM | Disposition: A | Discharge status: Skilled Nursing Facility | Payer: Self-pay | Source: Home / Self Care | Attending: Internal Medicine

## 2019-03-26 DIAGNOSIS — I5023 Acute on chronic systolic (congestive) heart failure: Secondary | ICD-10-CM

## 2019-03-26 HISTORY — PX: CORONARY PRESSURE/FFR STUDY: CATH118243

## 2019-03-26 LAB — COOXEMETRY PANEL
Carboxyhemoglobin: 1.5 % (ref 0.5–1.5)
Methemoglobin: 0.7 % (ref 0.0–1.5)
O2 Saturation: 73.5 %
Total hemoglobin: 12 g/dL (ref 12.0–16.0)

## 2019-03-26 LAB — CBC
HCT: 37 % — ABNORMAL LOW (ref 39.0–52.0)
Hemoglobin: 11.9 g/dL — ABNORMAL LOW (ref 13.0–17.0)
MCH: 30.5 pg (ref 26.0–34.0)
MCHC: 32.2 g/dL (ref 30.0–36.0)
MCV: 94.9 fL (ref 80.0–100.0)
Platelets: 266 10*3/uL (ref 150–400)
RBC: 3.9 MIL/uL — ABNORMAL LOW (ref 4.22–5.81)
RDW: 14.1 % (ref 11.5–15.5)
WBC: 10.3 10*3/uL (ref 4.0–10.5)
nRBC: 0 % (ref 0.0–0.2)

## 2019-03-26 LAB — POCT I-STAT 7, (LYTES, BLD GAS, ICA,H+H)
Acid-base deficit: 4 mmol/L — ABNORMAL HIGH (ref 0.0–2.0)
Bicarbonate: 24.4 mmol/L (ref 20.0–28.0)
Calcium, Ion: 1 mmol/L — ABNORMAL LOW (ref 1.15–1.40)
HCT: 45 % (ref 39.0–52.0)
Hemoglobin: 15.3 g/dL (ref 13.0–17.0)
O2 Saturation: 93 %
Potassium: 3.7 mmol/L (ref 3.5–5.1)
Sodium: 105 mmol/L — CL (ref 135–145)
TCO2: 26 mmol/L (ref 22–32)
pCO2 arterial: 55.2 mmHg — ABNORMAL HIGH (ref 32.0–48.0)
pH, Arterial: 7.253 — ABNORMAL LOW (ref 7.350–7.450)
pO2, Arterial: 80 mmHg — ABNORMAL LOW (ref 83.0–108.0)

## 2019-03-26 LAB — BASIC METABOLIC PANEL
Anion gap: 12 (ref 5–15)
BUN: 11 mg/dL (ref 8–23)
CO2: 26 mmol/L (ref 22–32)
Calcium: 8.4 mg/dL — ABNORMAL LOW (ref 8.9–10.3)
Chloride: 92 mmol/L — ABNORMAL LOW (ref 98–111)
Creatinine, Ser: 0.7 mg/dL (ref 0.61–1.24)
GFR calc Af Amer: >60 mL/min (ref >60)
GFR calc non Af Amer: >60 mL/min (ref >60)
Glucose, Bld: 107 mg/dL — ABNORMAL HIGH (ref 70–99)
Potassium: 3.7 mmol/L (ref 3.5–5.1)
Sodium: 130 mmol/L — ABNORMAL LOW (ref 135–145)

## 2019-03-26 LAB — GLUCOSE, CAPILLARY
Glucose-Capillary: 104 mg/dL — ABNORMAL HIGH (ref 70–99)
Glucose-Capillary: 110 mg/dL — ABNORMAL HIGH (ref 70–99)
Glucose-Capillary: 115 mg/dL — ABNORMAL HIGH (ref 70–99)
Glucose-Capillary: 168 mg/dL — ABNORMAL HIGH (ref 70–99)
Glucose-Capillary: 251 mg/dL — ABNORMAL HIGH (ref 70–99)
Glucose-Capillary: 276 mg/dL — ABNORMAL HIGH (ref 70–99)

## 2019-03-26 LAB — POCT ACTIVATED CLOTTING TIME: Activated Clotting Time: 279 seconds

## 2019-03-26 SURGERY — INTRAVASCULAR PRESSURE WIRE/FFR STUDY
Anesthesia: LOCAL

## 2019-03-26 MED ORDER — IOHEXOL 350 MG/ML SOLN
INTRAVENOUS | Status: AC
  Filled 2019-03-26: qty 1

## 2019-03-26 MED ORDER — MIDAZOLAM HCL 2 MG/2ML IJ SOLN
INTRAMUSCULAR | Status: DC | PRN
  Administered 2019-03-26: 1 mg via INTRAVENOUS

## 2019-03-26 MED ORDER — VERAPAMIL HCL 2.5 MG/ML IV SOLN
INTRAVENOUS | Status: DC | PRN
  Administered 2019-03-26: 13:00:00 10 mL via INTRA_ARTERIAL

## 2019-03-26 MED ORDER — LIDOCAINE HCL (PF) 1 % IJ SOLN
INTRAMUSCULAR | Status: AC
  Filled 2019-03-26: qty 30

## 2019-03-26 MED ORDER — LIDOCAINE HCL (PF) 1 % IJ SOLN
INTRAMUSCULAR | Status: DC | PRN
  Administered 2019-03-26: 2 mL

## 2019-03-26 MED ORDER — HEPARIN (PORCINE) IN NACL 1000-0.9 UT/500ML-% IV SOLN
INTRAVENOUS | Status: AC
  Filled 2019-03-26: qty 500

## 2019-03-26 MED ORDER — HEPARIN SODIUM (PORCINE) 1000 UNIT/ML IJ SOLN
INTRAMUSCULAR | Status: AC
  Filled 2019-03-26: qty 1

## 2019-03-26 MED ORDER — MIDAZOLAM HCL 2 MG/2ML IJ SOLN
INTRAMUSCULAR | Status: AC
  Filled 2019-03-26: qty 2

## 2019-03-26 MED ORDER — SODIUM CHLORIDE 0.9 % IV SOLN
250.0000 mL | INTRAVENOUS | Status: DC | PRN
  Administered 2019-03-27: 11:00:00 250 mL via INTRAVENOUS

## 2019-03-26 MED ORDER — HEPARIN (PORCINE) IN NACL 1000-0.9 UT/500ML-% IV SOLN
INTRAVENOUS | Status: DC | PRN
  Administered 2019-03-26: 500 mL

## 2019-03-26 MED ORDER — FENTANYL CITRATE (PF) 100 MCG/2ML IJ SOLN
INTRAMUSCULAR | Status: DC | PRN
  Administered 2019-03-26: 25 ug via INTRAVENOUS

## 2019-03-26 MED ORDER — SODIUM CHLORIDE 0.9% FLUSH: 3.0000 mL | Freq: Two times a day (BID) | INTRAVENOUS | Status: DC

## 2019-03-26 MED ORDER — LABETALOL HCL 5 MG/ML IV SOLN: 10.0000 mg | INTRAVENOUS | Status: AC | PRN

## 2019-03-26 MED ORDER — VERAPAMIL HCL 2.5 MG/ML IV SOLN
INTRAVENOUS | Status: AC
  Filled 2019-03-26: qty 2

## 2019-03-26 MED ORDER — SODIUM CHLORIDE 0.9% FLUSH: 3.0000 mL | INTRAVENOUS | Status: DC | PRN

## 2019-03-26 MED ORDER — IOHEXOL 350 MG/ML SOLN
INTRAVENOUS | Status: DC | PRN
  Administered 2019-03-26: 14:00:00 30 mL via INTRA_ARTERIAL

## 2019-03-26 MED ORDER — FENTANYL CITRATE (PF) 100 MCG/2ML IJ SOLN
INTRAMUSCULAR | Status: AC
  Filled 2019-03-26: qty 2

## 2019-03-26 MED ORDER — HEPARIN SODIUM (PORCINE) 1000 UNIT/ML IJ SOLN
INTRAMUSCULAR | Status: DC | PRN
  Administered 2019-03-26: 8000 [IU] via INTRAVENOUS

## 2019-03-26 MED ORDER — HYDRALAZINE HCL 20 MG/ML IJ SOLN: 10.0000 mg | INTRAMUSCULAR | Status: AC | PRN

## 2019-03-26 MED ORDER — NITROGLYCERIN 1 MG/10 ML FOR IR/CATH LAB
INTRA_ARTERIAL | Status: AC
  Filled 2019-03-26: qty 10

## 2019-03-26 MED ORDER — SODIUM CHLORIDE 0.9 % IV SOLN: INTRAVENOUS | Status: AC

## 2019-03-26 SURGICAL SUPPLY — 12 items
CATH LAUNCHER 5F JR4 (CATHETERS) ×1 IMPLANT
DEVICE RAD TR BAND REGULAR (VASCULAR PRODUCTS) ×1 IMPLANT
GLIDESHEATH SLEND SS 6F .021 (SHEATH) ×1 IMPLANT
GUIDEWIRE INQWIRE 1.5J.035X260 (WIRE) IMPLANT
GUIDEWIRE PRESSURE COMET II (WIRE) ×1 IMPLANT
INQWIRE 1.5J .035X260CM (WIRE) ×2
KIT ENCORE 26 ADVANTAGE (KITS) ×1 IMPLANT
KIT HEART LEFT (KITS) ×2 IMPLANT
PACK CARDIAC CATHETERIZATION (CUSTOM PROCEDURE TRAY) ×2 IMPLANT
SHEATH PROBE COVER 6X72 (BAG) ×1 IMPLANT
TRANSDUCER W/STOPCOCK (MISCELLANEOUS) ×2 IMPLANT
TUBING CIL FLEX 10 FLL-RA (TUBING) ×2 IMPLANT

## 2019-03-26 NOTE — Progress Notes (Signed)
R radial and R groin clip/CHG prep for cath this am.

## 2019-03-26 NOTE — Plan of Care (Signed)
  Problem: Clinical Measurements: Goal: Ability to maintain clinical measurements within normal limits will improve Outcome: Progressing Goal: Will remain free from infection Outcome: Progressing Goal: Respiratory complications will improve Outcome: Progressing Goal: Cardiovascular complication will be avoided Outcome: Progressing   Problem: Coping: Goal: Level of anxiety will decrease Outcome: Progressing   Problem: Pain Managment: Goal: General experience of comfort will improve Outcome: Progressing   

## 2019-03-26 NOTE — H&P (View-Only) (Signed)
Patient ID: Matthew Mccormick, male   DOB: 05/27/1955, 63 y.o.   MRN: [id number] P    Advanced Heart Failure Rounding Note  PCP-Cardiologist: Suresh Koneswaran, MD   Subjective:    He is off Lasix gtt today.  CVP 9. Co-ox 73%.  No problems overnight. Weight trending down. Renal function stable. SBP 90s-100s   2/11: Atherectomy/DES to LM/proximal LAD and PTCA ramus, protected by Impella.  Impella removed after case with hematoma left groin.   Objective:   Weight Range: 100.9 kg Body mass index is 30.17 kg/m.   Vital Signs:   Temp:  [97.5 F (36.4 C)-98.7 F (37.1 C)] 97.5 F (36.4 C) (02/15 0400) Pulse Rate:  [69-95] 79 (02/15 0700) Resp:  [10-21] 13 (02/15 0700) BP: (78-110)/(52-79) 94/62 (02/15 0700) SpO2:  [86 %-100 %] 98 % (02/15 0700) Weight:  [100.9 kg] 100.9 kg (02/15 0500) Last BM Date: 03/19/19  Weight change: Filed Weights   03/24/19 0400 03/25/19 0500 03/26/19 0500  Weight: 104.3 kg 102.2 kg 100.9 kg    Intake/Output:   Intake/Output Summary (Last 24 hours) at 03/26/2019 0737 Last data filed at 03/26/2019 0700 Gross per 24 hour  Intake 1577.44 ml  Output 2900 ml  Net -1322.56 ml      Physical Exam   General: NAD Neck: JVP 8-9 cm, no thyromegaly or thyroid nodule.  Lungs: Clear to auscultation bilaterally with normal respiratory effort. CV: Nondisplaced PMI.  Heart regular S1/S2, no S3/S4, no murmur.  1+ ankle edema.  N Abdomen: Soft, nontender, no hepatosplenomegaly, no distention.  Skin: Intact without lesions or rashes.  Neurologic: Alert and oriented x 3.  Psych: Normal affect. Extremities: No clubbing or cyanosis.  HEENT: Normal.    Telemetry   Sinus 80s Personally reviewed  Labs    CBC Recent Labs    03/24/19 0521 03/24/19 0521 03/24/19 1810 03/26/19 0410  WBC 10.4  --   --  10.3  NEUTROABS 7.8*  --   --   --   HGB 12.2*   < > 12.5* 11.9*  HCT 36.7*   < > 38.6* 37.0*  MCV 91.3  --   --  94.9  PLT 196  --   --  266   < > =  values in this interval not displayed.   Basic Metabolic Panel Recent Labs    03/25/19 0426 03/26/19 0410  NA 130* 130*  K 4.1 3.7  CL 92* 92*  CO2 29 26  GLUCOSE 99 107*  BUN 11 11  CREATININE 0.78 0.70  CALCIUM 8.4* 8.4*   Liver Function Tests No results for input(s): AST, ALT, ALKPHOS, BILITOT, PROT, ALBUMIN in the last 72 hours. No results for input(s): LIPASE, AMYLASE in the last 72 hours. Cardiac Enzymes No results for input(s): CKTOTAL, CKMB, CKMBINDEX, TROPONINI in the last 72 hours.  BNP: BNP (last 3 results) Recent Labs    03/14/19 1832  BNP 1,338.6*    ProBNP (last 3 results) No results for input(s): PROBNP in the last 8760 hours.   D-Dimer No results for input(s): DDIMER in the last 72 hours. Hemoglobin A1C No results for input(s): HGBA1C in the last 72 hours. Fasting Lipid Panel No results for input(s): CHOL, HDL, LDLCALC, TRIG, CHOLHDL, LDLDIRECT in the last 72 hours. Thyroid Function Tests No results for input(s): TSH, T4TOTAL, T3FREE, THYROIDAB in the last 72 hours.  Invalid input(s): FREET3  Other results:   Imaging    No results found.   Medications:       Scheduled Medications: . sodium chloride   Intravenous Once  . aspirin  81 mg Oral Daily  . Chlorhexidine Gluconate Cloth  6 each Topical Daily  . clopidogrel  75 mg Oral Daily  . digoxin  0.125 mg Oral Daily  . enoxaparin (LOVENOX) injection  40 mg Subcutaneous Q24H  . insulin aspart  0-15 Units Subcutaneous Q4H  . ivabradine  5 mg Oral BID WC  . mouth rinse  15 mL Mouth Rinse BID  . rosuvastatin  20 mg Oral q1800  . sodium chloride flush  10-40 mL Intracatheter Q12H  . sodium chloride flush  3 mL Intravenous Q12H  . sodium chloride flush  3 mL Intravenous Q12H  . spironolactone  25 mg Oral Daily    Infusions: . sodium chloride    . sodium chloride    . sodium chloride Stopped (03/26/19 0542)  . sodium chloride    . sodium chloride 10 mL/hr at 03/26/19 0543    PRN  Medications: sodium chloride, sodium chloride, sodium chloride, sodium chloride, acetaminophen, ondansetron (ZOFRAN) IV, ondansetron **OR** [DISCONTINUED] ondansetron (ZOFRAN) IV, Resource ThickenUp Clear, sodium chloride flush, sodium chloride flush, sodium chloride flush    Assessment/Plan   1. Acute on chronic systolic CHF:  Initial cardiogenic shock.  Prior echo in 2018 with EF 30-35%, now echo with EF down to 10-15% with severe RV dysfunction. Ischemic cardiomyopathy based on cath.  No ACS this admission (HS-TnI in the 100s range with no trend).  Initially on milrinone, now off.  Remains in sinus with soft BP but has had adequate co-ox at 73%. Off Lasix and weight down. Renal function stable. BP stable.  CVP 9.   - Hold Lasix today pre-PCI, will need to start po Lasix likely tomorrow.  - Continue digoxin 0.125 daily.   - Continue spironolactone to 25 mg daily.  - No ARB/Entresto with low BP.  - Continue ivabradine 5 mg bid.  2. ID: Initial concern for RLL PNA on CT chest with septic shock. However, afebrile and WBCs only mildly elevated. Cultures remains negative. Remains afebrile.  Now off abx.   - No change 3. Elevated LFTs: Suspect due to shock liver with hypotension.   - Resolving 4. H/o CVA: Residual left-sided weakness as well as some cognitive dysfunction per notes.    - He continues on Plavix.  - Continue statin.   5. Hyponatremia: Sodium 127-> 129 -> 130. Restrict free water.    6. PAD: Moderate RLE vascular disease by ABIs, left normal.  7. CAD: Severe 3VD on cath. He underwent atherectomy/stenting left main into LAD and PTCA of ramus on 2/11.  - Continue ASA/Plavix.  - Continue atorvastatin.  - PCI to PLV, flow wire ostial RCA this morning with Dr. Cooper.  Should be able to be done without Impella support (had difficult time with Impella placement Thursday).  8. Deconditioning: Baseline poor but lives alone.  Will need SNF. Needs PT.   Updated cousin this morning.    Length of Stay: 13  Shawnta Schlegel, MD  03/26/2019, 7:37 AM  Advanced Heart Failure Team Pager [phone] (M-F; 7a - 4p)  Please contact [facility] for night-coverage after hours (4p -7a ) and weekends on [email or web address]     

## 2019-03-26 NOTE — Interval H&P Note (Signed)
  Cath Lab Visit (complete for each Cath Lab visit)  Clinical Evaluation Leading to the Procedure:   ACS: No.  Non-ACS:    Anginal Classification: CCS III  Anti-ischemic medical therapy: Minimal Therapy (1 class of medications)  Non-Invasive Test Results: No non-invasive testing performed  Prior CABG: No previous CABG  History and Physical Interval Note:  03/26/2019 12:29 PM  Matthew Mccormick  has presented today for surgery, with the diagnosis of CAD.  The various methods of treatment have been discussed with the patient and family. After consideration of risks, benefits and other options for treatment, the patient has consented to  Procedure(s): CORONARY STENT INTERVENTION (N/A) as a surgical intervention.  The patient's history has been reviewed, patient examined, no change in status, stable for surgery.  I have reviewed the patient's chart and labs.  Questions were answered to the patient's satisfaction.     Tonny Bollman

## 2019-03-26 NOTE — Progress Notes (Addendum)
Patient ID: Matthew Mccormick, male   DOB: 1955/10/08, 64 y.o.   MRN: [id number] P    Advanced Heart Failure Rounding Note  PCP-Cardiologist: Kate Sable, MD   Subjective:    He is off Lasix gtt today.  CVP 9. Co-ox 73%.  No problems overnight. Weight trending down. Renal function stable. SBP 90s-100s   2/11: Atherectomy/DES to LM/proximal LAD and PTCA ramus, protected by Impella.  Impella removed after case with hematoma left groin.   Objective:   Weight Range: 100.9 kg Body mass index is 30.17 kg/m.   Vital Signs:   Temp:  [97.5 F (36.4 C)-98.7 F (37.1 C)] 97.5 F (36.4 C) (02/15 0400) Pulse Rate:  [69-95] 79 (02/15 0700) Resp:  [10-21] 13 (02/15 0700) BP: (78-110)/(52-79) 94/62 (02/15 0700) SpO2:  [86 %-100 %] 98 % (02/15 0700) Weight:  [100.9 kg] 100.9 kg (02/15 0500) Last BM Date: 03/19/19  Weight change: Filed Weights   03/24/19 0400 03/25/19 0500 03/26/19 0500  Weight: 104.3 kg 102.2 kg 100.9 kg    Intake/Output:   Intake/Output Summary (Last 24 hours) at 03/26/2019 0737 Last data filed at 03/26/2019 0700 Gross per 24 hour  Intake 1577.44 ml  Output 2900 ml  Net -1322.56 ml      Physical Exam   General: NAD Neck: JVP 8-9 cm, no thyromegaly or thyroid nodule.  Lungs: Clear to auscultation bilaterally with normal respiratory effort. CV: Nondisplaced PMI.  Heart regular S1/S2, no S3/S4, no murmur.  1+ ankle edema.  N Abdomen: Soft, nontender, no hepatosplenomegaly, no distention.  Skin: Intact without lesions or rashes.  Neurologic: Alert and oriented x 3.  Psych: Normal affect. Extremities: No clubbing or cyanosis.  HEENT: Normal.    Telemetry   Sinus 80s Personally reviewed  Labs    CBC Recent Labs    03/24/19 0521 03/24/19 0521 03/24/19 1810 03/26/19 0410  WBC 10.4  --   --  10.3  NEUTROABS 7.8*  --   --   --   HGB 12.2*   < > 12.5* 11.9*  HCT 36.7*   < > 38.6* 37.0*  MCV 91.3  --   --  94.9  PLT 196  --   --  266   < > =  values in this interval not displayed.   Basic Metabolic Panel Recent Labs    03/25/19 0426 03/26/19 0410  NA 130* 130*  K 4.1 3.7  CL 92* 92*  CO2 29 26  GLUCOSE 99 107*  BUN 11 11  CREATININE 0.78 0.70  CALCIUM 8.4* 8.4*   Liver Function Tests No results for input(s): AST, ALT, ALKPHOS, BILITOT, PROT, ALBUMIN in the last 72 hours. No results for input(s): LIPASE, AMYLASE in the last 72 hours. Cardiac Enzymes No results for input(s): CKTOTAL, CKMB, CKMBINDEX, TROPONINI in the last 72 hours.  BNP: BNP (last 3 results) Recent Labs    03/14/19 1832  BNP 1,338.6*    ProBNP (last 3 results) No results for input(s): PROBNP in the last 8760 hours.   D-Dimer No results for input(s): DDIMER in the last 72 hours. Hemoglobin A1C No results for input(s): HGBA1C in the last 72 hours. Fasting Lipid Panel No results for input(s): CHOL, HDL, LDLCALC, TRIG, CHOLHDL, LDLDIRECT in the last 72 hours. Thyroid Function Tests No results for input(s): TSH, T4TOTAL, T3FREE, THYROIDAB in the last 72 hours.  Invalid input(s): FREET3  Other results:   Imaging    No results found.   Medications:  Scheduled Medications: . sodium chloride   Intravenous Once  . aspirin  81 mg Oral Daily  . Chlorhexidine Gluconate Cloth  6 each Topical Daily  . clopidogrel  75 mg Oral Daily  . digoxin  0.125 mg Oral Daily  . enoxaparin (LOVENOX) injection  40 mg Subcutaneous Q24H  . insulin aspart  0-15 Units Subcutaneous Q4H  . ivabradine  5 mg Oral BID WC  . mouth rinse  15 mL Mouth Rinse BID  . rosuvastatin  20 mg Oral q1800  . sodium chloride flush  10-40 mL Intracatheter Q12H  . sodium chloride flush  3 mL Intravenous Q12H  . sodium chloride flush  3 mL Intravenous Q12H  . spironolactone  25 mg Oral Daily    Infusions: . sodium chloride    . sodium chloride    . sodium chloride Stopped (03/26/19 0542)  . sodium chloride    . sodium chloride 10 mL/hr at 03/26/19 0543    PRN  Medications: sodium chloride, sodium chloride, sodium chloride, sodium chloride, acetaminophen, ondansetron (ZOFRAN) IV, ondansetron **OR** [DISCONTINUED] ondansetron (ZOFRAN) IV, Resource ThickenUp Clear, sodium chloride flush, sodium chloride flush, sodium chloride flush    Assessment/Plan   1. Acute on chronic systolic CHF:  Initial cardiogenic shock.  Prior echo in 2018 with EF 30-35%, now echo with EF down to 10-15% with severe RV dysfunction. Ischemic cardiomyopathy based on cath.  No ACS this admission (HS-TnI in the 100s range with no trend).  Initially on milrinone, now off.  Remains in sinus with soft BP but has had adequate co-ox at 73%. Off Lasix and weight down. Renal function stable. BP stable.  CVP 9.   - Hold Lasix today pre-PCI, will need to start po Lasix likely tomorrow.  - Continue digoxin 0.125 daily.   - Continue spironolactone to 25 mg daily.  - No ARB/Entresto with low BP.  - Continue ivabradine 5 mg bid.  2. ID: Initial concern for RLL PNA on CT chest with septic shock. However, afebrile and WBCs only mildly elevated. Cultures remains negative. Remains afebrile.  Now off abx.   - No change 3. Elevated LFTs: Suspect due to shock liver with hypotension.   - Resolving 4. H/o CVA: Residual left-sided weakness as well as some cognitive dysfunction per notes.    - He continues on Plavix.  - Continue statin.   5. Hyponatremia: Sodium 127-> 129 -> 130. Restrict free water.    6. PAD: Moderate RLE vascular disease by ABIs, left normal.  7. CAD: Severe 3VD on cath. He underwent atherectomy/stenting left main into LAD and PTCA of ramus on 2/11.  - Continue ASA/Plavix.  - Continue atorvastatin.  - PCI to PLV, flow wire ostial RCA this morning with Dr. Excell Seltzer.  Should be able to be done without Impella support (had difficult time with Impella placement Thursday).  8. Deconditioning: Baseline poor but lives alone.  Will need SNF. Needs PT.   Updated cousin this morning.    Length of Stay: 90  Marca Ancona, MD  03/26/2019, 7:37 AM  Advanced Heart Failure Team Pager [phone] (M-F; 7a - 4p)  Please contact [facility] for night-coverage after hours (4p -7a ) and weekends on [email or web address]

## 2019-03-26 NOTE — Progress Notes (Signed)
PROGRESS NOTE    Matthew Mccormick  NFA:[id number] DOB: [birth year] DOA: 03/13/2019 PCP: Monico Blitz, MD   Brief Narrative: Patient is a 64 year old male with history of atrial fibrillation, diabetes type 2, hypertension, thombotic stroke who presented at [facility] hospital initially with several days of weakness, malaise, nausea, vomiting.  He was hypotensive on presentation.  He was suspected to have  cardiogenic shock and was transferred to [facility] for further management. Heart failure team is following.  He underwent cardiac cath on 03/19/2019 with finding of multivessel disease.   Underwent atherectomy based PCI of left main-proximal LAD with placement of DES, balloon angioplasty of ostial and proximal ramus intermedius on 03/22/19. Plan for PCI to PLV, Flow Wire ostial RCA on 03/26/2019.  Assessment & Plan:   Active Problems:   Sepsis ([place])   Pressure injury of skin   Ischemic hepatitis   Cardiogenic shock:  Cardiology team following. Currently on  spironolactone, digoxin, ivabradine. Prior echo in 2018 showed ejection fraction of 30 to 35%, current echo showing EF of 10 to 15% with severe right ventricular dysfunction. Underwent right/left heart catheterization on 03/19/2019 with finding of multivessel disease. He is a poor candidate for CABG. Underwent atherectomy based PCI of left main-proximal LAD with placement of DES, balloon angioplasty of ostial and proximal ramus intermedius on 03/22/19. Plan for PCI to PLV, Flow Wire ostial RCA on Monday.  Acute metabolic encephalopathy: Currently alert and oriented.  Patient lives by himself  in [place].  Acute hypoxemic respiratory failure: Present on admission.  Suspected to be from acute pulmonary edema from heart failure.  Chest x-ray did not show pneumonia.  CT chest was concerning for right lower lobe pneumonia but he is afebrile without leukocytosis.  Not on antibiotics.    Hypertension: Currently blood pressure soft.  Continue current  regimen  Diabetes mellitus 2: Continue sliding-scale insulin.  On insulin at home  History of CVA: Continue Plavix.  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 On statin  Elevated liver enzymes: Secondary to combination of hepatic condition, shock liver.  Liver enzymes trending down.  Suspected peripheral artery disease: Has a bilateral small ulcerations on bilateral legs.Present on admission.ABI showed moderate right lower  extremity arterial disease .  Will recommend follow-up with vascular surgery as an outpatient.Conitnue plavix.  On statin.  Hyponatremia:Likely econdary to hypervolemic hyponatremia from CHF.  Continue to monitor.  Sodium is 130 today.  Continue to restrict fluid.  Deconditioning/debility: Seen by physical therapy and recommended skilled nursing facility on discharge.  Pressure Injury 03/13/19 Sacrum Stage 1 -  Intact skin with non-blanchable redness of a localized area usually over a bony prominence. (Active)  03/13/19 1700  Location: Sacrum  Location Orientation:   Staging: Stage 1 -  Intact skin with non-blanchable redness of a localized area usually over a bony prominence.  Wound Description (Comments):   Present on Admission: Yes              DVT prophylaxis:Lovenox Code Status: Full Family Communication: Cousin on phone on 03/23/19 Disposition Plan: Patient is from home.  PT/oT recommending SnF.  He is not clinically ready for discharge and cardiology hasnt cleared for dc.  Plan for cath on 03/26/2019  Consultants: Cardiology  Procedures:None  Antimicrobials:  Anti-infectives (From admission, onward)   Start     Dose/Rate Route Frequency Ordered Stop   03/14/19 1200  cefTRIAXone (ROCEPHIN) 2 g in sodium chloride 0.9 % 100 mL IVPB  Status:  Discontinued  2 g 200 mL/hr over 30 Minutes Intravenous Every 24 hours 03/14/19 0951 03/15/19 0954   03/14/19 1100  azithromycin (ZITHROMAX) 500 mg in sodium chloride 0.9 %  250 mL IVPB  Status:  Discontinued     500 mg 250 mL/hr over 60 Minutes Intravenous Every 24 hours 03/14/19 0951 03/15/19 0954   03/14/19 0000  vancomycin (VANCOREADY) IVPB 1250 mg/250 mL  Status:  Discontinued     1,250 mg 166.7 mL/hr over 90 Minutes Intravenous Every 12 hours 03/13/19 1158 03/14/19 0951   03/13/19 2000  ceFEPIme (MAXIPIME) 2 g in sodium chloride 0.9 % 100 mL IVPB  Status:  Discontinued     2 g 200 mL/hr over 30 Minutes Intravenous Every 8 hours 03/13/19 1058 03/14/19 0951   03/13/19 1030  vancomycin (VANCOCIN) IVPB 1000 mg/200 mL premix  Status:  Discontinued     1,000 mg 200 mL/hr over 60 Minutes Intravenous Every 1 hr x 2 03/13/19 1001 03/13/19 1002   03/13/19 1030  vancomycin (VANCOREADY) IVPB 2000 mg/400 mL     2,000 mg 200 mL/hr over 120 Minutes Intravenous  Once 03/13/19 1002 03/13/19 1343   03/13/19 1000  ceFEPIme (MAXIPIME) 2 g in sodium chloride 0.9 % 100 mL IVPB     2 g 200 mL/hr over 30 Minutes Intravenous  Once 03/13/19 0952 03/13/19 1103   03/13/19 1000  metroNIDAZOLE (FLAGYL) IVPB 500 mg     500 mg 100 mL/hr over 60 Minutes Intravenous  Once 03/13/19 0952 03/13/19 1141   03/13/19 1000  vancomycin (VANCOCIN) IVPB 1000 mg/200 mL premix  Status:  Discontinued     1,000 mg 200 mL/hr over 60 Minutes Intravenous  Once 03/13/19 6301 03/13/19 1001      Subjective:  Patient seen and examined the bedside this morning.  Hemodynamically stable.  Sleeping.  Denies chest pain or shortness of breath.  Waiting for catheter.    Objective: Vitals:   03/26/19 0400 03/26/19 0500 03/26/19 0600 03/26/19 0700  BP: 93/67 (!) 89/63 95/62 94/62   Pulse: 79 73 82 79  Resp: 12 12 (!) 21 13  Temp: (!) 97.5 F (36.4 C)     TempSrc: Oral     SpO2: 98% 99% 98% 98%  Weight:  100.9 kg    Height:        Intake/Output Summary (Last 24 hours) at 03/26/2019 0735 Last data filed at 03/26/2019 0700 Gross per 24 hour  Intake 1577.44 ml  Output 2900 ml  Net -1322.56 ml    Filed Weights   03/24/19 0400 03/25/19 0500 03/26/19 0500  Weight: 104.3 kg 102.2 kg 100.9 kg    Examination: .     General exam: Sleeping, not in distress Respiratory system: no wheezes or crackles  Cardiovascular system: S1 & S2 heard, RRR. No murmurs, rubs, gallops or clicks. Gastrointestinal system: Abdomen is nondistended, soft and nontender. No organomegaly or masses felt. Normal bowel sounds heard. Central nervous system: Alert and oriented. Extremities: No edema, no clubbing ,no cyanosis,  Skin: Ulcerations on bilateral legs covered with dressings.    Data Reviewed: I have personally reviewed following labs and imaging studies  CBC: Recent Labs  Lab 03/21/19 1755 03/22/19 1023 03/22/19 2008 03/23/19 0358 03/24/19 0521 03/24/19 1810 03/26/19 0410  WBC 10.6*  --  12.1* 9.9 10.4  --  10.3  NEUTROABS 7.9*  --   --   --  7.8*  --   --   HGB 13.9   < > 13.5 13.7 12.2* 12.5*  11.9*  HCT 42.8   < > 41.7 42.3 36.7* 38.6* 37.0*  MCV 94.5  --  93.9 94.8 91.3  --  94.9  PLT 202  --  198 207 196  --  266   < > = values in this interval not displayed.   Basic Metabolic Panel: Recent Labs  Lab 03/22/19 0500 03/22/19 0500 03/22/19 1023 03/22/19 1156 03/22/19 2008 03/23/19 0358 03/24/19 0521 03/25/19 0426 03/26/19 0410  NA 130*  --    < > 127*  --  127* 129* 130* 130*  K 4.8  --    < > 5.1  --  4.9 4.1 4.1 3.7  CL 87*  --   --   --   --  93* 94* 92* 92*  CO2 28  --   --   --   --  26 29 29 26   GLUCOSE 160*  --   --   --   --  173* 94 99 107*  BUN 17  --   --   --   --  21 14 11 11   CREATININE 0.74   < >  --   --  0.76 0.68 0.56* 0.78 0.70  CALCIUM 8.8*  --   --   --   --  8.4* 8.6* 8.4* 8.4*   < > = values in this interval not displayed.   GFR: Estimated Creatinine Clearance: 116.2 mL/min (by C-G formula based on SCr of 0.7 mg/dL). Liver Function Tests: Recent Labs  Lab 03/20/19 0410 03/21/19 0603  AST 203* 102*  ALT 535* 372*  ALKPHOS 173* 157*   BILITOT 3.0* 2.2*  PROT 5.4* 5.5*  ALBUMIN 2.3* 2.4*   No results for input(s): LIPASE, AMYLASE in the last 168 hours. No results for input(s): AMMONIA in the last 168 hours. Coagulation Profile: Recent Labs  Lab 03/22/19 0500  INR 1.3*   Cardiac Enzymes: No results for input(s): CKTOTAL, CKMB, CKMBINDEX, TROPONINI in the last 168 hours. BNP (last 3 results) No results for input(s): PROBNP in the last 8760 hours. HbA1C: No results for input(s): HGBA1C in the last 72 hours. CBG: Recent Labs  Lab 03/25/19 1114 03/25/19 1518 03/25/19 1953 03/25/19 2312 03/26/19 0415  GLUCAP 185* 237* 152* 90 104*   Lipid Profile: No results for input(s): CHOL, HDL, LDLCALC, TRIG, CHOLHDL, LDLDIRECT in the last 72 hours. Thyroid Function Tests: No results for input(s): TSH, T4TOTAL, FREET4, T3FREE, THYROIDAB in the last 72 hours. Anemia Panel: No results for input(s): VITAMINB12, FOLATE, FERRITIN, TIBC, IRON, RETICCTPCT in the last 72 hours. Sepsis Labs: No results for input(s): PROCALCITON, LATICACIDVEN in the last 168 hours.  Recent Results (from the past 240 hour(s))  MRSA PCR Screening     Status: None   Collection Time: 03/21/19  7:03 PM   Specimen: Nasal Mucosa; Nasopharyngeal  Result Value Ref Range Status   MRSA by PCR NEGATIVE NEGATIVE Final    Comment:        The GeneXpert MRSA Assay (FDA approved for NASAL specimens only), is one component of a comprehensive MRSA colonization surveillance program. It is not intended to diagnose MRSA infection nor to guide or monitor treatment for MRSA infections. Performed at [facility], [address] [place], [address] [place]          Radiology Studies: No results found.      Scheduled Meds: . sodium chloride   Intravenous Once  . aspirin  81 mg Oral Daily  . Chlorhexidine  Gluconate Cloth  6 each Topical Daily  . clopidogrel  75 mg Oral Daily  . digoxin  0.125 mg Oral Daily  . enoxaparin (LOVENOX)  injection  40 mg Subcutaneous Q24H  . insulin aspart  0-15 Units Subcutaneous Q4H  . ivabradine  5 mg Oral BID WC  . mouth rinse  15 mL Mouth Rinse BID  . rosuvastatin  20 mg Oral q1800  . sodium chloride flush  10-40 mL Intracatheter Q12H  . sodium chloride flush  3 mL Intravenous Q12H  . sodium chloride flush  3 mL Intravenous Q12H  . spironolactone  25 mg Oral Daily   Continuous Infusions: . sodium chloride    . sodium chloride    . sodium chloride Stopped (03/26/19 0542)  . sodium chloride    . sodium chloride 10 mL/hr at 03/26/19 0543     LOS: 13 days    Time spent: 35 mins. More than 50% of that time was spent in counseling and/or coordination of care.      Burnadette Pop, MD [facility] P2/15/2021, 7:35 AM

## 2019-03-27 DIAGNOSIS — I5023 Acute on chronic systolic (congestive) heart failure: Secondary | ICD-10-CM

## 2019-03-27 LAB — BASIC METABOLIC PANEL
Anion gap: 7 (ref 5–15)
BUN: 10 mg/dL (ref 8–23)
CO2: 28 mmol/L (ref 22–32)
Calcium: 8.1 mg/dL — ABNORMAL LOW (ref 8.9–10.3)
Chloride: 95 mmol/L — ABNORMAL LOW (ref 98–111)
Creatinine, Ser: 0.66 mg/dL (ref 0.61–1.24)
GFR calc Af Amer: >60 mL/min (ref >60)
GFR calc non Af Amer: >60 mL/min (ref >60)
Glucose, Bld: 100 mg/dL — ABNORMAL HIGH (ref 70–99)
Potassium: 3.9 mmol/L (ref 3.5–5.1)
Sodium: 130 mmol/L — ABNORMAL LOW (ref 135–145)

## 2019-03-27 LAB — COOXEMETRY PANEL
Carboxyhemoglobin: 2 % — ABNORMAL HIGH (ref 0.5–1.5)
Methemoglobin: 1 % (ref 0.0–1.5)
O2 Saturation: 68.5 %
Total hemoglobin: 11.6 g/dL — ABNORMAL LOW (ref 12.0–16.0)

## 2019-03-27 LAB — GLUCOSE, CAPILLARY
Glucose-Capillary: 97 mg/dL (ref 70–99)
Glucose-Capillary: 114 mg/dL — ABNORMAL HIGH (ref 70–99)
Glucose-Capillary: 127 mg/dL — ABNORMAL HIGH (ref 70–99)
Glucose-Capillary: 165 mg/dL — ABNORMAL HIGH (ref 70–99)
Glucose-Capillary: 194 mg/dL — ABNORMAL HIGH (ref 70–99)
Glucose-Capillary: 181 mg/dL — ABNORMAL HIGH (ref 70–99)

## 2019-03-27 MED ORDER — FUROSEMIDE 40 MG PO TABS
40.0000 mg | ORAL_TABLET | Freq: Every day | ORAL | Status: DC
  Administered 2019-03-27 – 2019-03-28 (×2): 40 mg via ORAL
  Filled 2019-03-27 (×2): qty 1

## 2019-03-27 MED FILL — Nitroglycerin IV Soln 100 MCG/ML in D5W: INTRA_ARTERIAL | Qty: 10 | Status: AC

## 2019-03-27 NOTE — Progress Notes (Addendum)
Patient ID: Matthew Mccormick, male   DOB: 1955/11/28, 64 y.o.   MRN: [id number] P    Advanced Heart Failure Rounding Note  PCP-Cardiologist: Kate Sable, MD   Subjective:    S/p complex PCI with atherectomy of the left main 2/11 using hemodynamic support with Impella. Impella removed. Returned to cath lab 2/15 for possible PCI of lesions in the ostial/proximal RCA and PLA branches, however DFR measured at 0.91. Lesions not hemodynamically significant. No indication for PCI RCA/PLB.   Co-ox 69%. CVP 7. Wt continues to trend down. Renal function stable.   No chest pain or dyspnea. Left radial cath site stable w/ 2+ radial pulse.     Objective:   Weight Range: 97.9 kg Body mass index is 29.27 kg/m.   Vital Signs:   Temp:  [97.5 F (36.4 C)-98.4 F (36.9 C)] 98.4 F (36.9 C) (02/16 0448) Pulse Rate:  [69-104] 81 (02/16 0448) Resp:  [7-19] 16 (02/16 0448) BP: (79-114)/(49-83) 95/56 (02/16 0448) SpO2:  [81 %-100 %] 92 % (02/16 0448) Weight:  [97.9 kg] 97.9 kg (02/16 0600) Last BM Date: 03/19/19  Weight change: Filed Weights   03/25/19 0500 03/26/19 0500 03/27/19 0600  Weight: 102.2 kg 100.9 kg 97.9 kg    Intake/Output:   Intake/Output Summary (Last 24 hours) at 03/27/2019 0724 Last data filed at 03/26/2019 2252 Gross per 24 hour  Intake 777.16 ml  Output 800 ml  Net -22.84 ml      Physical Exam   PHYSICAL EXAM: CVP 7  General: chronically ill appearing WM. No respiratory difficulty HEENT: normal Neck: supple. no JVD. Carotids 2+ bilat; no bruits. No lymphadenopathy or thyromegaly appreciated. Cor: PMI nondisplaced. Regular rate & rhythm. No rubs, gallops or murmurs. Lungs: clear Abdomen: soft, nontender, nondistended. No hepatosplenomegaly. No bruits or masses. Good bowel sounds. Extremities: no cyanosis, clubbing, rash, edema, + Lt subclavian CVC Neuro: alert & oriented x 3, cranial nerves grossly intact. moves all 4 extremities w/o difficulty. Affect  pleasant.    Telemetry   Sinus 80s Personally reviewed  Labs    CBC Recent Labs    03/24/19 1810 03/26/19 0410  WBC  --  10.3  HGB 12.5* 11.9*  HCT 38.6* 37.0*  MCV  --  94.9  PLT  --  578   Basic Metabolic Panel Recent Labs    03/26/19 0410 03/27/19 0530  NA 130* 130*  K 3.7 3.9  CL 92* 95*  CO2 26 28  GLUCOSE 107* 100*  BUN 11 10  CREATININE 0.70 0.66  CALCIUM 8.4* 8.1*   Liver Function Tests No results for input(s): AST, ALT, ALKPHOS, BILITOT, PROT, ALBUMIN in the last 72 hours. No results for input(s): LIPASE, AMYLASE in the last 72 hours. Cardiac Enzymes No results for input(s): CKTOTAL, CKMB, CKMBINDEX, TROPONINI in the last 72 hours.  BNP: BNP (last 3 results) Recent Labs    03/14/19 1832  BNP 1,338.6*    ProBNP (last 3 results) No results for input(s): PROBNP in the last 8760 hours.   D-Dimer No results for input(s): DDIMER in the last 72 hours. Hemoglobin A1C No results for input(s): HGBA1C in the last 72 hours. Fasting Lipid Panel No results for input(s): CHOL, HDL, LDLCALC, TRIG, CHOLHDL, LDLDIRECT in the last 72 hours. Thyroid Function Tests No results for input(s): TSH, T4TOTAL, T3FREE, THYROIDAB in the last 72 hours.  Invalid input(s): FREET3  Other results:   Imaging    CARDIAC CATHETERIZATION  Result Date: 03/26/2019 Moderate obstructive disease of  the proximal/ostial RCA as well as a large posterolateral branch, both lesions interrogated with DFR and are shown to not be hemodynamically significant.  Recommend continued medical therapy.    Medications:     Scheduled Medications: . aspirin  81 mg Oral Daily  . Chlorhexidine Gluconate Cloth  6 each Topical Daily  . clopidogrel  75 mg Oral Daily  . digoxin  0.125 mg Oral Daily  . enoxaparin (LOVENOX) injection  40 mg Subcutaneous Q24H  . insulin aspart  0-15 Units Subcutaneous Q4H  . ivabradine  5 mg Oral BID WC  . mouth rinse  15 mL Mouth Rinse BID  . rosuvastatin   20 mg Oral q1800  . sodium chloride flush  10-40 mL Intracatheter Q12H  . sodium chloride flush  3 mL Intravenous Q12H  . spironolactone  25 mg Oral Daily    Infusions: . sodium chloride      PRN Medications: sodium chloride, acetaminophen, ondansetron (ZOFRAN) IV, ondansetron **OR** [DISCONTINUED] ondansetron (ZOFRAN) IV, Resource ThickenUp Clear, sodium chloride flush, sodium chloride flush    Assessment/Plan   1. Acute on chronic systolic CHF:  Initial cardiogenic shock.  Prior echo in 2018 with EF 30-35%, now echo with EF down to 10-15% with severe RV dysfunction. Ischemic cardiomyopathy based on cath.  No ACS this admission (HS-TnI in the 100s range with no trend).  Initially on milrinone, now off.  Remains in sinus with soft BP but has had adequate co-ox at 69%. Off Lasix and weight down. Renal function stable. BP stable.  CVP 7   - Start PO Lasix today  - Continue digoxin 0.125 daily.   - Continue spironolactone to 25 mg daily.  - No ARB/Entresto with low BP.  - Continue ivabradine 5 mg bid.  2. ID: Initial concern for RLL PNA on CT chest with septic shock. However, afebrile and WBCs only mildly elevated. Cultures remains negative. Remains afebrile.  Now off abx.   - No change 3. Elevated LFTs: Suspect due to shock liver with hypotension.   - Resolving 4. H/o CVA: Residual left-sided weakness as well as some cognitive dysfunction per notes.    - He continues on Plavix.  - Continue statin.   5. Hyponatremia: Sodium 127-> 129 -> 130. Restrict free water.    6. PAD: Moderate RLE vascular disease by ABIs, left normal.  7. CAD: Severe 3VD on cath. He underwent atherectomy/stenting left main into LAD and PTCA of ramus on 2/11. DFR of RCA and PL is 0.91, confirming nonhemodynamically significant lesions (plan medical therapy). Currently stable w/o CP.  - Continue ASA/Plavix.  - Continue atorvastatin.  - needs baseline assessment of lipids. Will check FLP in AM. LDL goal < 70  mg/dl 8. Deconditioning: Baseline poor but lives alone.  Will need SNF. Needs PT.   Length of Stay: [address], PA-C  03/27/2019, 7:24 AM  Advanced Heart Failure Team Pager [phone] (M-F; 7a - 4p)  Please contact [facility] for night-coverage after hours (4p -7a ) and weekends on [email or web address]  No complaints this morning.  DFR of RCA past PLV yesterday was not significant, so no intervention on RCA.    CVP 7 today, co-ox 69%.   On exam, no JVD and 1+ ankle edema.  Regular S1S2.   I will start Lasix 40 mg po daily today.  SBP 90s-100s, will not start losartan yet with soft BP. Needs PT, think he should be ready soon for discharge to SNF.   Matthew Mccormick [employer]  03/27/2019 8:40 AM

## 2019-03-27 NOTE — Progress Notes (Signed)
Occupational Therapy Treatment Patient Details Name: Matthew Mccormick MRN: [id number] DOB: Mar 14, 1955 Today's Date: 03/27/2019    History of present illness Pt is 64 y/o M presents to the ER today after having had a fall at home.  He was trying to go to the bathroom and fell between his bed and dresser. Work up showed sepsis, R lower lobe PNA, peripheral cyanosis with increased INR, and elevated troponins with abnormal ECGs. PMH includes CKD, DM2, HTN, and stroke. Pt underwent PCI to LAD on 2/11.   OT comments  Patient continues to make steady progress towards goals in skilled OT session. Patient's session encompassed using stedy to complete sit to stand transfers to promote increased dynamic balance to aid in return to prior level of function. Pt required moderate verbal cues for sequencing and hand placement to use lift equipment, as well as max A of +2 in order to stand 3x. Patient requires cues at the bilateral hip in order to bring out of anterior tilt. Once positioned on stedy flaps, pt able to stand from elevated surface with min guard and stand upright for 10-15 seconds (hands supported on steady bar bilaterally). Patient required extended rest breaks to complete multiple trials, as well as increased cues to reorient and sequence task. Pt would continue to benefit from services acutely in order to aid in regaining prior level of function; will follow acutely.    Follow Up Recommendations  SNF;Supervision/Assistance - 24 hour    Equipment Recommendations  None recommended by OT    Recommendations for Other Services      Precautions / Restrictions Precautions Precautions: Fall Restrictions Weight Bearing Restrictions: No       Mobility Bed Mobility                  Transfers Overall transfer level: Needs assistance   Transfers: Sit to/from Stand Sit to Stand: Max assist;+2 physical assistance         General transfer comment: Pt required increased cues in order to  complete sit<>stand 3x, required positioning of knees and feet in stedy and presents with posterior lean, once in stedy flaps, pt able to stand witn no assist    Balance Overall balance assessment: Needs assistance         Standing balance support: Bilateral upper extremity supported Standing balance-Leahy Scale: Poor Standing balance comment: in stedy, patient able to stand with bilateral support for 10-15 seconds before sitting                           ADL either performed or assessed with clinical judgement   ADL Overall ADL's : Needs assistance/impaired Eating/Feeding: Set up;Sitting Eating/Feeding Details (indicate cue type and reason): Attempted to pour drink (intrinsic hand weakness noted as well as bilateral tremors) required min A to prevent spilling                     Toilet Transfer: +2 for physical assistance;+2 for safety/equipment;Maximal assistance;Cueing for sequencing(Used stedy) Armed forces technical officer Details (indicate cue type and reason): simulated with recliner 3 sit<>stands with stedy         Functional mobility during ADLs: Moderate assistance;Maximal assistance;+2 for physical assistance;+2 for safety/equipment General ADL Comments: pt presents with cognitive limitations, decreased activity tolerance, with decreased ability to engage in BADL     Vision Patient Visual Report: No change from baseline     Perception     Praxis  Cognition Arousal/Alertness: Awake/alert Behavior During Therapy: WFL for tasks assessed/performed Overall Cognitive Status: No family/caregiver present to determine baseline cognitive functioning                                 General Comments: Increased processing time        Exercises     Shoulder Instructions       General Comments      Pertinent Vitals/ Pain       Pain Assessment: Faces Faces Pain Scale: Hurts little more Pain Location: feet, buttocks from positioning Pain  Descriptors / Indicators: Guarding;Grimacing;Discomfort Pain Intervention(s): Limited activity within patient's tolerance;Monitored during session;Repositioned  Home Living                                          Prior Functioning/Environment              Frequency  Min 2X/week        Progress Toward Goals  OT Goals(current goals can now be found in the care plan section)  Progress towards OT goals: Progressing toward goals  Acute Rehab OT Goals Patient Stated Goal: return home after rehab OT Goal Formulation: With patient Time For Goal Achievement: 03/30/19 Potential to Achieve Goals: Good  Plan Discharge plan remains appropriate    Co-evaluation                 AM-PAC OT "6 Clicks" Daily Activity     Outcome Measure   Help from another person eating meals?: A Little Help from another person taking care of personal grooming?: A Little Help from another person toileting, which includes using toliet, bedpan, or urinal?: A Lot Help from another person bathing (including washing, rinsing, drying)?: A Lot Help from another person to put on and taking off regular upper body clothing?: A Lot Help from another person to put on and taking off regular lower body clothing?: A Lot 6 Click Score: 14    End of Session Equipment Utilized During Treatment: Gait belt;Other (comment)(Stedy)  OT Visit Diagnosis: Unsteadiness on feet (R26.81);Other abnormalities of gait and mobility (R26.89);Muscle weakness (generalized) (M62.81);Other symptoms and signs involving cognitive function   Activity Tolerance Patient tolerated treatment well   Patient Left in chair;with call bell/phone within reach;with chair alarm set   Nurse Communication          Time: 7622-6333 OT Time Calculation (min): 29 min  Charges: OT General Charges $OT Visit: 1 Visit OT Treatments $Self Care/Home Management : 23-37 mins  Pollyann Glen E. Chany Woolworth, COTA/L Acute Rehabilitation  Services [phone]   Cherlyn Cushing 03/27/2019, 3:08 PM

## 2019-03-27 NOTE — TOC Progression Note (Signed)
Transition of Care [facility]) - Progression Note    Patient Details  Name: Matthew Mccormick MRN: [id number] Date of Birth: Dec 30, 1955  Transition of Care [facility]) CM/SW Contact  Nonda Lou, [place] Phone Number: [phone] 03/27/2019, 3:07 PM  Clinical Narrative:    Patient and family have preference for facility near [place]. CSW informed [facility] unable to accept patient. Accepted by [facility]. Patient not yet medically cleared. CSW will continue to follow and assist with discharge needs.   Expected Discharge Plan: Home w Home Health Services Barriers to Discharge: Continued Medical Work up  Expected Discharge Plan and Services Expected Discharge Plan: Home w Home Health Services       Living arrangements for the past 2 months: Apartment                                       Social Determinants of Health (SDOH) Interventions    Readmission Risk Interventions No flowsheet data found.

## 2019-03-27 NOTE — Plan of Care (Signed)
Problem: Nutrition: Goal: Adequate nutrition will be maintained Outcome: Progressing  Problem: Safety: Goal: Ability to remain free from injury will improve Outcome: Progressing  Problem: Clinical Measurements: Goal: Will remain free from infection Outcome: Progressing Goal: Respiratory complications will improve Outcome: Progressing Goal: Cardiovascular complication will be avoided Outcome: Progressing

## 2019-03-27 NOTE — Progress Notes (Signed)
PROGRESS NOTE    Matthew Mccormick  VFI:[id number] DOB: 23-Dec-1955 DOA: 03/13/2019 PCP: Kirstie Peri, MD   Brief Narrative: Patient is a 64 year old male with history of atrial fibrillation, diabetes type 2, hypertension, thombotic stroke who presented at [facility] hospital initially with several days of weakness, malaise, nausea, vomiting.  He was hypotensive on presentation.  He was suspected to have  cardiogenic shock and was transferred to [facility] for further management. Heart failure team is following.  He underwent cardiac cath on 03/19/2019 with finding of multivessel disease.   Underwent atherectomy based PCI of left main-proximal LAD with placement of DES, balloon angioplasty of ostial and proximal ramus intermedius on 03/22/19. Heart failure team closely following.  Plan for discharge to skilled nursing facility after cardiology clearance.SW following.  Assessment & Plan:   Active Problems:   Sepsis (HCC)   Pressure injury of skin   Ischemic hepatitis   Acute on chronic systolic heart failure [facility])   Cardiogenic shock:  Cardiology team following. Currently on  Lasix,spironolactone, digoxin, ivabradine. Prior echo in 2018 showed ejection fraction of 30 to 35%, current echo showing EF of 10 to 15% with severe right ventricular dysfunction. Underwent right/left heart catheterization on 03/19/2019 with finding of multivessel disease. He is a poor candidate for CABG. Underwent atherectomy based PCI of left main-proximal LAD with placement of DES, balloon angioplasty of ostial and proximal ramus intermedius on 03/22/19. Underwent another cath on 2/15 with plan for  PCI to PLV, Flow Wire ostial RCA , but no intervention done and plan for medical management.Not started on losartan yet because of soft blood pressure.  Acute metabolic encephalopathy: Currently alert and oriented.  Patient lives by himself  in [place].  Acute hypoxemic respiratory failure: Present on admission.  Suspected to be  from acute pulmonary edema from heart failure.  Chest x-ray did not show pneumonia.  CT chest was concerning for right lower lobe pneumonia but he is afebrile without leukocytosis.  Not on antibiotics.    Hypertension: Currently blood pressure soft.  Continue current regimen  Diabetes mellitus 2: Continue sliding-scale insulin.Monitor CBGs.  On insulin at home  History of CVA: Continue Plavix.  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 On statin  Elevated liver enzymes: Secondary to combination of hepatic condition, shock liver.  Liver enzymes trended down.  Suspected peripheral artery disease: Has a bilateral small ulcerations on bilateral legs.Present on admission.ABI showed moderate right lower  extremity arterial disease .  Will recommend follow-up with vascular surgery as an outpatient.Continue  plavix.  On statin.  Hyponatremia:Likely secondary to hypervolemic hyponatremia from CHF.  Continue to monitor.  Sodium is 130 today.  Continue to restrict fluid.  Deconditioning/debility: Seen by physical therapy and recommended skilled nursing facility on discharge.SW working for this.  Pressure Injury 03/13/19 Sacrum Stage 1 -  Intact skin with non-blanchable redness of a localized area usually over a bony prominence. (Active)  03/13/19 1700  Location: Sacrum  Location Orientation:   Staging: Stage 1 -  Intact skin with non-blanchable redness of a localized area usually over a bony prominence.  Wound Description (Comments):   Present on Admission: Yes              DVT prophylaxis:Lovenox Code Status: Full Family Communication: Cousin on phone on 03/23/19 Disposition Plan: Patient is from home.  PT/oT recommending SnF.  He has not been cleared yet by cardiology for discharge .  SNF nursing facility bed not available yet  Consultants: Cardiology  Procedures:None  Antimicrobials:  Anti-infectives (From admission, onward)   Start     Dose/Rate  Route Frequency Ordered Stop   03/14/19 1200  cefTRIAXone (ROCEPHIN) 2 g in sodium chloride 0.9 % 100 mL IVPB  Status:  Discontinued     2 g 200 mL/hr over 30 Minutes Intravenous Every 24 hours 03/14/19 0951 03/15/19 0954   03/14/19 1100  azithromycin (ZITHROMAX) 500 mg in sodium chloride 0.9 % 250 mL IVPB  Status:  Discontinued     500 mg 250 mL/hr over 60 Minutes Intravenous Every 24 hours 03/14/19 0951 03/15/19 0954   03/14/19 0000  vancomycin (VANCOREADY) IVPB 1250 mg/250 mL  Status:  Discontinued     1,250 mg 166.7 mL/hr over 90 Minutes Intravenous Every 12 hours 03/13/19 1158 03/14/19 0951   03/13/19 2000  ceFEPIme (MAXIPIME) 2 g in sodium chloride 0.9 % 100 mL IVPB  Status:  Discontinued     2 g 200 mL/hr over 30 Minutes Intravenous Every 8 hours 03/13/19 1058 03/14/19 0951   03/13/19 1030  vancomycin (VANCOCIN) IVPB 1000 mg/200 mL premix  Status:  Discontinued     1,000 mg 200 mL/hr over 60 Minutes Intravenous Every 1 hr x 2 03/13/19 1001 03/13/19 1002   03/13/19 1030  vancomycin (VANCOREADY) IVPB 2000 mg/400 mL     2,000 mg 200 mL/hr over 120 Minutes Intravenous  Once 03/13/19 1002 03/13/19 1343   03/13/19 1000  ceFEPIme (MAXIPIME) 2 g in sodium chloride 0.9 % 100 mL IVPB     2 g 200 mL/hr over 30 Minutes Intravenous  Once 03/13/19 0952 03/13/19 1103   03/13/19 1000  metroNIDAZOLE (FLAGYL) IVPB 500 mg     500 mg 100 mL/hr over 60 Minutes Intravenous  Once 03/13/19 0952 03/13/19 1141   03/13/19 1000  vancomycin (VANCOCIN) IVPB 1000 mg/200 mL premix  Status:  Discontinued     1,000 mg 200 mL/hr over 60 Minutes Intravenous  Once 03/13/19 4270 03/13/19 1001      Subjective:  Patient seen and examined the bedside this morning.    Blood pressure still soft, mild sinus tachycardia.  Denies any chest pain or shortness of breath.  No significant changes from yesterday.    Objective: Vitals:   03/27/19 0600 03/27/19 0800 03/27/19 1007 03/27/19 1218  BP:  101/65  107/71  Pulse:   79  87  Resp:  17  18  Temp:  98.1 F (36.7 C)  99.1 F (37.3 C)  TempSrc:  Oral  Oral  SpO2:  100% 100%   Weight: 97.9 kg     Height: 6' (1.829 m)       Intake/Output Summary (Last 24 hours) at 03/27/2019 1312 Last data filed at 03/27/2019 1300 Gross per 24 hour  Intake 1221.16 ml  Output 400 ml  Net 821.16 ml   Filed Weights   03/25/19 0500 03/26/19 0500 03/27/19 0600  Weight: 102.2 kg 100.9 kg 97.9 kg    Examination:  General exam: Generalized weakness, lying in bed, sleeping  Respiratory system: Decreased air entry on the bases, no wheezes or crackles  Cardiovascular system: S1 & S2 heard, RRR. No JVD, murmurs, rubs, gallops or clicks.  Central line on the left neck Gastrointestinal system: Abdomen is nondistended, soft and nontender. No organomegaly or masses felt. Normal bowel sounds heard. Central nervous system: Alert and awake Extremities: Trace bilateral lower extremity edema, no clubbing ,no cyanosis  Skin: Ulcerations on bilateral legs covered with dressings.   Data  Reviewed: I have personally reviewed following labs and imaging studies  CBC: Recent Labs  Lab 03/21/19 1755 03/22/19 1023 03/22/19 2008 03/23/19 0358 03/24/19 0521 03/24/19 1810 03/26/19 0410  WBC 10.6*  --  12.1* 9.9 10.4  --  10.3  NEUTROABS 7.9*  --   --   --  7.8*  --   --   HGB 13.9   < > 13.5 13.7 12.2* 12.5* 11.9*  HCT 42.8   < > 41.7 42.3 36.7* 38.6* 37.0*  MCV 94.5  --  93.9 94.8 91.3  --  94.9  PLT 202  --  198 207 196  --  266   < > = values in this interval not displayed.   Basic Metabolic Panel: Recent Labs  Lab 03/23/19 0358 03/24/19 0521 03/25/19 0426 03/26/19 0410 03/27/19 0530  NA 127* 129* 130* 130* 130*  K 4.9 4.1 4.1 3.7 3.9  CL 93* 94* 92* 92* 95*  CO2 26 29 29 26 28   GLUCOSE 173* 94 99 107* 100*  BUN 21 14 11 11 10   CREATININE 0.68 0.56* 0.78 0.70 0.66  CALCIUM 8.4* 8.6* 8.4* 8.4* 8.1*   GFR: Estimated Creatinine Clearance: 114.6 mL/min (by C-G  formula based on SCr of 0.66 mg/dL). Liver Function Tests: Recent Labs  Lab 03/21/19 0603  AST 102*  ALT 372*  ALKPHOS 157*  BILITOT 2.2*  PROT 5.5*  ALBUMIN 2.4*   No results for input(s): LIPASE, AMYLASE in the last 168 hours. No results for input(s): AMMONIA in the last 168 hours. Coagulation Profile: Recent Labs  Lab 03/22/19 0500  INR 1.3*   Cardiac Enzymes: No results for input(s): CKTOTAL, CKMB, CKMBINDEX, TROPONINI in the last 168 hours. BNP (last 3 results) No results for input(s): PROBNP in the last 8760 hours. HbA1C: No results for input(s): HGBA1C in the last 72 hours. CBG: Recent Labs  Lab 03/26/19 2007 03/26/19 2353 03/27/19 0443 03/27/19 0808 03/27/19 1205  GLUCAP 276* 168* 97 114* 127*   Lipid Profile: No results for input(s): CHOL, HDL, LDLCALC, TRIG, CHOLHDL, LDLDIRECT in the last 72 hours. Thyroid Function Tests: No results for input(s): TSH, T4TOTAL, FREET4, T3FREE, THYROIDAB in the last 72 hours. Anemia Panel: No results for input(s): VITAMINB12, FOLATE, FERRITIN, TIBC, IRON, RETICCTPCT in the last 72 hours. Sepsis Labs: No results for input(s): PROCALCITON, LATICACIDVEN in the last 168 hours.  Recent Results (from the past 240 hour(s))  MRSA PCR Screening     Status: None   Collection Time: 03/21/19  7:03 PM   Specimen: Nasal Mucosa; Nasopharyngeal  Result Value Ref Range Status   MRSA by PCR NEGATIVE NEGATIVE Final    Comment:        The GeneXpert MRSA Assay (FDA approved for NASAL specimens only), is one component of a comprehensive MRSA colonization surveillance program. It is not intended to diagnose MRSA infection nor to guide or monitor treatment for MRSA infections. Performed at [facility], [address] [place], [address] [place]          Radiology Studies: CARDIAC CATHETERIZATION  Result Date: 03/26/2019 Moderate obstructive disease of the proximal/ostial RCA as well as a large posterolateral branch, both  lesions interrogated with DFR and are shown to not be hemodynamically significant.  Recommend continued medical therapy.       Scheduled Meds: . aspirin  81 mg Oral Daily  . Chlorhexidine Gluconate Cloth  6 each Topical Daily  . clopidogrel  75 mg Oral Daily  . digoxin  0.125 mg Oral Daily  . enoxaparin (LOVENOX) injection  40 mg Subcutaneous Q24H  . furosemide  40 mg Oral Daily  . insulin aspart  0-15 Units Subcutaneous Q4H  . ivabradine  5 mg Oral BID WC  . mouth rinse  15 mL Mouth Rinse BID  . rosuvastatin  20 mg Oral q1800  . sodium chloride flush  10-40 mL Intracatheter Q12H  . sodium chloride flush  3 mL Intravenous Q12H  . spironolactone  25 mg Oral Daily   Continuous Infusions: . sodium chloride 250 mL (03/27/19 1051)     LOS: 14 days    Time spent: 35 mins. More than 50% of that time was spent in counseling and/or coordination of care.      Shelly Coss, MD [facility] P2/16/2021, 1:12 PM

## 2019-03-27 NOTE — Progress Notes (Signed)
Physical Therapy Treatment Patient Details Name: Matthew Mccormick MRN: [id number] DOB: 1955/07/01 Today's Date: 03/27/2019    History of Present Illness Pt is 64 y/o M presents to the ER today after having had a fall at home.  He was trying to go to the bathroom and fell between his bed and dresser. Work up showed sepsis, R lower lobe PNA, peripheral cyanosis with increased INR, and elevated troponins with abnormal ECGs. PMH includes CKD, DM2, HTN, and stroke. Pt underwent PCI to LAD on 2/11.    PT Comments    Patient progressing with sit to stand to RW with less posterior bias this session and able to take steps to recliner.  Also performed LE therex in supine and very cooperative.  Remains appropriate for SNF level rehab at d/c.   Follow Up Recommendations  SNF     Equipment Recommendations  Other (comment)(tba)    Recommendations for Other Services       Precautions / Restrictions Precautions Precautions: Fall Restrictions Weight Bearing Restrictions: No    Mobility  Bed Mobility Overal bed mobility: Needs Assistance Bed Mobility: Supine to Sit     Supine to sit: Min assist;HOB elevated;+2 for physical assistance        Transfers Overall transfer level: Needs assistance Equipment used: Rolling walker (2 wheeled) Transfers: Sit to/from Stand Sit to Stand: +2 physical assistance;Mod assist Stand pivot transfers: Mod assist       General transfer comment: cues for technique, lifting assist to stand, stepping to chair with RW and min to mod A of 1.  Ambulation/Gait                 Stairs             Wheelchair Mobility    Modified Rankin (Stroke Patients Only)       Balance Overall balance assessment: Needs assistance Sitting-balance support: Single extremity supported;Feet supported Sitting balance-Leahy Scale: Fair Sitting balance - Comments: S for balance EOB   Standing balance support: Bilateral upper extremity supported Standing  balance-Leahy Scale: Poor Standing balance comment: standing with RW and UE support with min A                            Cognition Arousal/Alertness: Awake/alert Behavior During Therapy: WFL for tasks assessed/performed Overall Cognitive Status: No family/caregiver present to determine baseline cognitive functioning                                 General Comments: cooperative, slow processing, decreased safety and deficit awareness      Exercises General Exercises - Lower Extremity Ankle Circles/Pumps: AAROM;10 reps;Supine;Both Short Arc Quad: AROM;5 reps;Supine;Both;Strengthening Heel Slides: AROM;Both;5 reps;Supine;Strengthening Hip ABduction/ADduction: AROM;Supine;5 reps;Strengthening;Both    General Comments        Pertinent Vitals/Pain Pain Assessment: Faces Faces Pain Scale: No hurt Pain Location: feet, buttocks from positioning Pain Descriptors / Indicators: Guarding;Grimacing;Discomfort Pain Intervention(s): Limited activity within patient's tolerance;Monitored during session;Repositioned    Home Living                      Prior Function            PT Goals (current goals can now be found in the care plan section) Acute Rehab PT Goals Patient Stated Goal: return home after rehab PT Goal Formulation: With patient Time For Goal Achievement: 04/10/19  Potential to Achieve Goals: Good Progress towards PT goals: Progressing toward goals    Frequency    Min 2X/week      PT Plan Current plan remains appropriate    Co-evaluation              AM-PAC PT "6 Clicks" Mobility   Outcome Measure  Help needed turning from your back to your side while in a flat bed without using bedrails?: A Little Help needed moving from lying on your back to sitting on the side of a flat bed without using bedrails?: A Little Help needed moving to and from a bed to a chair (including a wheelchair)?: A Lot Help needed standing up from a  chair using your arms (e.g., wheelchair or bedside chair)?: A Lot Help needed to walk in hospital room?: Total Help needed climbing 3-5 steps with a railing? : Total 6 Click Score: 12    End of Session Equipment Utilized During Treatment: Gait belt;Oxygen Activity Tolerance: Patient tolerated treatment well Patient left: in chair;with call bell/phone within reach;with chair alarm set   PT Visit Diagnosis: Unsteadiness on feet (R26.81);Other abnormalities of gait and mobility (R26.89);Muscle weakness (generalized) (M62.81)     Time: 5852-7782 PT Time Calculation (min) (ACUTE ONLY): 32 min  Charges:  $Therapeutic Exercise: 8-22 mins $Therapeutic Activity: 8-22 mins                     Sheran Lawless, [facility] [phone] 03/27/2019    Matthew Mccormick 03/27/2019, 4:55 PM

## 2019-03-28 DIAGNOSIS — E871 Hypo-osmolality and hyponatremia: Secondary | ICD-10-CM

## 2019-03-28 DIAGNOSIS — J9601 Acute respiratory failure with hypoxia: Secondary | ICD-10-CM

## 2019-03-28 DIAGNOSIS — R5381 Other malaise: Secondary | ICD-10-CM

## 2019-03-28 DIAGNOSIS — E1165 Type 2 diabetes mellitus with hyperglycemia: Secondary | ICD-10-CM

## 2019-03-28 DIAGNOSIS — I739 Peripheral vascular disease, unspecified: Secondary | ICD-10-CM

## 2019-03-28 LAB — BASIC METABOLIC PANEL
Anion gap: 7 (ref 5–15)
BUN: 9 mg/dL (ref 8–23)
CO2: 28 mmol/L (ref 22–32)
Calcium: 8.5 mg/dL — ABNORMAL LOW (ref 8.9–10.3)
Chloride: 99 mmol/L (ref 98–111)
Creatinine, Ser: 0.63 mg/dL (ref 0.61–1.24)
GFR calc Af Amer: >60 mL/min (ref >60)
GFR calc non Af Amer: >60 mL/min (ref >60)
Glucose, Bld: 111 mg/dL — ABNORMAL HIGH (ref 70–99)
Potassium: 3.8 mmol/L (ref 3.5–5.1)
Sodium: 134 mmol/L — ABNORMAL LOW (ref 135–145)

## 2019-03-28 LAB — GLUCOSE, CAPILLARY
Glucose-Capillary: 105 mg/dL — ABNORMAL HIGH (ref 70–99)
Glucose-Capillary: 106 mg/dL — ABNORMAL HIGH (ref 70–99)
Glucose-Capillary: 147 mg/dL — ABNORMAL HIGH (ref 70–99)
Glucose-Capillary: 148 mg/dL — ABNORMAL HIGH (ref 70–99)
Glucose-Capillary: 197 mg/dL — ABNORMAL HIGH (ref 70–99)
Glucose-Capillary: 232 mg/dL — ABNORMAL HIGH (ref 70–99)
Glucose-Capillary: 233 mg/dL — ABNORMAL HIGH (ref 70–99)

## 2019-03-28 LAB — COOXEMETRY PANEL
Carboxyhemoglobin: 2.3 % — ABNORMAL HIGH (ref 0.5–1.5)
Methemoglobin: 1 % (ref 0.0–1.5)
O2 Saturation: 75.5 %
Total hemoglobin: 11.7 g/dL — ABNORMAL LOW (ref 12.0–16.0)

## 2019-03-28 LAB — LIPID PANEL
Cholesterol: 97 mg/dL (ref 0–200)
HDL: 23 mg/dL — ABNORMAL LOW (ref >40)
LDL Cholesterol: 62 mg/dL (ref 0–99)
Total CHOL/HDL Ratio: 4.2 RATIO
Triglycerides: 60 mg/dL (ref <150)
VLDL: 12 mg/dL (ref 0–40)

## 2019-03-28 MED ORDER — FUROSEMIDE 40 MG PO TABS
40.0000 mg | ORAL_TABLET | Freq: Two times a day (BID) | ORAL | Status: DC
  Administered 2019-03-28 – 2019-03-30 (×4): 40 mg via ORAL
  Filled 2019-03-28 (×4): qty 1

## 2019-03-28 MED ORDER — LOSARTAN POTASSIUM 25 MG PO TABS
12.5000 mg | ORAL_TABLET | Freq: Every day | ORAL | Status: DC
  Administered 2019-03-29 – 2019-03-30 (×2): 12.5 mg via ORAL
  Filled 2019-03-28 (×3): qty 1

## 2019-03-28 NOTE — Progress Notes (Signed)
PROGRESS NOTE  Matthew Mccormick:[id number] DOB: 09/01/1955   PCP: Monico Blitz, MD  Patient is from: Home.  Lives alone.  DOA: 03/13/2019 LOS: 15  Brief Narrative / Interim history: 64 year old male with history of A. fib, DM-2, HTN and thrombotic CVA presented to [facility] with weakness, malaise, nausea and vomiting and found to be in cardiogenic shock and transferred to [facility] for further care.  Advanced heart failure team consulted.  On 03/19/2019, he underwent R/LHC that revealed multivessel disease.  He had arthrectomy of left main, DES stent to LAD and balloon angioplasty of ostial and proximal ramus intermedius point 03/22/2019.  Subjective: No major events overnight or this morning.  No complaints.  He denies chest pain, dyspnea, GI or UTI symptoms.  Oriented x4.  On room air.  Objective: Vitals:   03/28/19 0353 03/28/19 0838 03/28/19 0910 03/28/19 1310  BP: 96/66 96/64  (!) 88/54  Pulse: 76 80  75  Resp: 15 16  (!) 0  Temp: 97.6 F (36.4 C)     TempSrc: Oral     SpO2: 96% 99% 98% 100%  Weight:      Height:        Intake/Output Summary (Last 24 hours) at 03/28/2019 1523 Last data filed at 03/28/2019 1010 Gross per 24 hour  Intake 842 ml  Output -  Net 842 ml   Filed Weights   03/25/19 0500 03/26/19 0500 03/27/19 0600  Weight: 102.2 kg 100.9 kg 97.9 kg    Examination:  GENERAL: No acute distress.  Appears well.  HEENT: MMM.  Vision and hearing grossly intact.  NECK: Supple.  No apparent JVD.  RESP: On RA.  No IWOB.  Fair aeration bilaterally. CVS:  RRR. Heart sounds normal.  ABD/GI/GU: Bowel sounds present. Soft. Non tender.  MSK/EXT:  Moves extremities.  Trace edema bilaterally. SKIN: no apparent skin lesion or wound NEURO: Awake, alert and oriented appropriately.  No apparent focal neuro deficit. PSYCH: Calm. Normal affect.  Procedures:  2/8-R/LHC with multivessel disease. 2/11-arthroscopy of left main, DES stent to proximal LAD and balloon  angioplasty of ostial and proximal ramus intermedius point 03/22/2019.  Assessment & Plan: Acute on chronic systolic CHF/ICM/cardiogenic shock: Echo with EF of 10 to 15% (previously 35% in 2018) with severe RV dysfunction.  Heart catheterizations as above.  I and O incomplete.  Renal function stable. -Advanced HF team managing  -p.o. Lasix 40 mg twice daily  -Digoxin 0.125 mg daily  -Aldactone 25 mg daily  -Losartan when BP stable  -Ivabradine 5 mg twice daily -Monitor fluid status, renal function and electrolytes  Acute metabolic encephalopathy-unclear etiology.  Resolved.  He is oriented x4. -Reorientation and delirium precautions  Acute hypoxemic respiratory failure: Likely due to CHF.  Unlikely pneumonia.  Resolved.  Uncontrolled DM-2 with hyperglycemia Recent Labs    03/28/19 0355 03/28/19 0741 03/28/19 1241  GLUCAP 106* 105* 197*  -Continue current regimen  Essential hypertension: Soft blood pressures -Cardiac meds as above per cardiology.  History of CVA: No apparent focal neuro deficit -Continue home meds  Elevated enzymes: Likely due to cardiogenic shock.  Improved. -Recheck in the morning  Suspected PAD: ABI showed moderate RLE disease. -Continue statin and Plavix -Outpatient follow-up with vascular surgery  Hyponatremia: Improved. -Continue monitoring  Physical deconditioning/debility -PT/OT-SNF   Pressure Injury 03/13/19 Sacrum Stage 1 -  Intact skin with non-blanchable redness of a localized area usually over a bony prominence. (Active)  03/13/19 1700  Location: Sacrum  Location Orientation:  Staging: Stage 1 -  Intact skin with non-blanchable redness of a localized area usually over a bony prominence.  Wound Description (Comments):   Present on Admission: Yes               DVT prophylaxis: Subcu Lovenox Code Status: Full code Family Communication: Patient and/or RN. Available if any question.  Discharge barrier: Safe disposition Patient  is from: Home Final disposition: SNF  Consultants: Cardiology   Microbiology summarized: Blood cultures negative. Urine cultures negative. MRSA PCR negative. COVID-19 negative. Influenza PCR negative.  Sch Meds:  Scheduled Meds: . aspirin  81 mg Oral Daily  . Chlorhexidine Gluconate Cloth  6 each Topical Daily  . clopidogrel  75 mg Oral Daily  . digoxin  0.125 mg Oral Daily  . enoxaparin (LOVENOX) injection  40 mg Subcutaneous Q24H  . furosemide  40 mg Oral BID  . insulin aspart  0-15 Units Subcutaneous Q4H  . ivabradine  5 mg Oral BID WC  . losartan  12.5 mg Oral Daily  . mouth rinse  15 mL Mouth Rinse BID  . rosuvastatin  20 mg Oral q1800  . sodium chloride flush  10-40 mL Intracatheter Q12H  . sodium chloride flush  3 mL Intravenous Q12H  . spironolactone  25 mg Oral Daily   Continuous Infusions: . sodium chloride 250 mL (03/27/19 1051)   PRN Meds:.sodium chloride, acetaminophen, ondansetron (ZOFRAN) IV, ondansetron **OR** [DISCONTINUED] ondansetron (ZOFRAN) IV, Resource ThickenUp Clear, sodium chloride flush, sodium chloride flush  Antimicrobials: Anti-infectives (From admission, onward)   Start     Dose/Rate Route Frequency Ordered Stop   03/14/19 1200  cefTRIAXone (ROCEPHIN) 2 g in sodium chloride 0.9 % 100 mL IVPB  Status:  Discontinued     2 g 200 mL/hr over 30 Minutes Intravenous Every 24 hours 03/14/19 0951 03/15/19 0954   03/14/19 1100  azithromycin (ZITHROMAX) 500 mg in sodium chloride 0.9 % 250 mL IVPB  Status:  Discontinued     500 mg 250 mL/hr over 60 Minutes Intravenous Every 24 hours 03/14/19 0951 03/15/19 0954   03/14/19 0000  vancomycin (VANCOREADY) IVPB 1250 mg/250 mL  Status:  Discontinued     1,250 mg 166.7 mL/hr over 90 Minutes Intravenous Every 12 hours 03/13/19 1158 03/14/19 0951   03/13/19 2000  ceFEPIme (MAXIPIME) 2 g in sodium chloride 0.9 % 100 mL IVPB  Status:  Discontinued     2 g 200 mL/hr over 30 Minutes Intravenous Every 8 hours  03/13/19 1058 03/14/19 0951   03/13/19 1030  vancomycin (VANCOCIN) IVPB 1000 mg/200 mL premix  Status:  Discontinued     1,000 mg 200 mL/hr over 60 Minutes Intravenous Every 1 hr x 2 03/13/19 1001 03/13/19 1002   03/13/19 1030  vancomycin (VANCOREADY) IVPB 2000 mg/400 mL     2,000 mg 200 mL/hr over 120 Minutes Intravenous  Once 03/13/19 1002 03/13/19 1343   03/13/19 1000  ceFEPIme (MAXIPIME) 2 g in sodium chloride 0.9 % 100 mL IVPB     2 g 200 mL/hr over 30 Minutes Intravenous  Once 03/13/19 0952 03/13/19 1103   03/13/19 1000  metroNIDAZOLE (FLAGYL) IVPB 500 mg     500 mg 100 mL/hr over 60 Minutes Intravenous  Once 03/13/19 0952 03/13/19 1141   03/13/19 1000  vancomycin (VANCOCIN) IVPB 1000 mg/200 mL premix  Status:  Discontinued     1,000 mg 200 mL/hr over 60 Minutes Intravenous  Once 03/13/19 0952 03/13/19 1001  I have personally reviewed the following labs and images: CBC: Recent Labs  Lab 03/21/19 1755 03/22/19 1023 03/22/19 2008 03/23/19 0358 03/24/19 0521 03/24/19 1810 03/26/19 0410  WBC 10.6*  --  12.1* 9.9 10.4  --  10.3  NEUTROABS 7.9*  --   --   --  7.8*  --   --   HGB 13.9   < > 13.5 13.7 12.2* 12.5* 11.9*  HCT 42.8   < > 41.7 42.3 36.7* 38.6* 37.0*  MCV 94.5  --  93.9 94.8 91.3  --  94.9  PLT 202  --  198 207 196  --  266   < > = values in this interval not displayed.   BMP &GFR Recent Labs  Lab 03/24/19 0521 03/25/19 0426 03/26/19 0410 03/27/19 0530 03/28/19 0506  NA 129* 130* 130* 130* 134*  K 4.1 4.1 3.7 3.9 3.8  CL 94* 92* 92* 95* 99  CO2 29 29 26 28 28   GLUCOSE 94 99 107* 100* 111*  BUN 14 11 11 10 9   CREATININE 0.56* 0.78 0.70 0.66 0.63  CALCIUM 8.6* 8.4* 8.4* 8.1* 8.5*   Estimated Creatinine Clearance: 114.6 mL/min (by C-G formula based on SCr of 0.63 mg/dL). Liver & Pancreas: No results for input(s): AST, ALT, ALKPHOS, BILITOT, PROT, ALBUMIN in the last 168 hours. No results for input(s): LIPASE, AMYLASE in the last 168 hours. No  results for input(s): AMMONIA in the last 168 hours. Diabetic: No results for input(s): HGBA1C in the last 72 hours. Recent Labs  Lab 03/27/19 2148 03/28/19 0014 03/28/19 0355 03/28/19 0741 03/28/19 1241  GLUCAP 181* 148* 106* 105* 197*   Cardiac Enzymes: No results for input(s): CKTOTAL, CKMB, CKMBINDEX, TROPONINI in the last 168 hours. No results for input(s): PROBNP in the last 8760 hours. Coagulation Profile: Recent Labs  Lab 03/22/19 0500  INR 1.3*   Thyroid Function Tests: No results for input(s): TSH, T4TOTAL, FREET4, T3FREE, THYROIDAB in the last 72 hours. Lipid Profile: Recent Labs    03/28/19 0506  CHOL 97  HDL 23*  LDLCALC 62  TRIG 60  CHOLHDL 4.2   Anemia Panel: No results for input(s): VITAMINB12, FOLATE, FERRITIN, TIBC, IRON, RETICCTPCT in the last 72 hours. Urine analysis:    Component Value Date/Time   COLORURINE AMBER (A) 03/14/2019 0600   APPEARANCEUR HAZY (A) 03/14/2019 0600   LABSPEC 1.032 (H) 03/14/2019 0600   PHURINE 5.0 03/14/2019 0600   GLUCOSEU >=500 (A) 03/14/2019 0600   HGBUR NEGATIVE 03/14/2019 0600   BILIRUBINUR NEGATIVE 03/14/2019 0600   KETONESUR 5 (A) 03/14/2019 0600   PROTEINUR 30 (A) 03/14/2019 0600   NITRITE NEGATIVE 03/14/2019 0600   LEUKOCYTESUR NEGATIVE 03/14/2019 0600   Sepsis Labs: Invalid input(s): PROCALCITONIN, LACTICIDVEN  Microbiology: Recent Results (from the past 240 hour(s))  MRSA PCR Screening     Status: None   Collection Time: 03/21/19  7:03 PM   Specimen: Nasal Mucosa; Nasopharyngeal  Result Value Ref Range Status   MRSA by PCR NEGATIVE NEGATIVE Final    Comment:        The GeneXpert MRSA Assay (FDA approved for NASAL specimens only), is one component of a comprehensive MRSA colonization surveillance program. It is not intended to diagnose MRSA infection nor to guide or monitor treatment for MRSA infections. Performed at [facility], [address], [place], [address] [place]      Radiology Studies: No results found.  45 minutes with more than 50% spent in reviewing records,  counseling patient/family and coordinating care.   Taye T. Gonfa [facility]  If 7PM-7AM, please contact night-coverage [email or web address] Password [email or web address] 03/28/2019, 3:23 PM

## 2019-03-28 NOTE — Progress Notes (Signed)
Patient ID: Matthew Mccormick, male   DOB: 1955/03/18, 64 y.o.   MRN: [id number] P    Advanced Heart Failure Rounding Note  PCP-Cardiologist: Prentice Docker, MD   Subjective:    S/p complex PCI with atherectomy of the left main 2/11 using hemodynamic support with Impella. Impella removed. Returned to cath lab 2/15 for possible PCI of lesions in the ostial/proximal RCA and PLA branches, however DFR measured at 0.91. Lesions not hemodynamically significant. No indication for PCI RCA/PLB.   Co-ox 76%. CVP 9-10. Renal function stable.   No chest pain or dyspnea.   Objective:   Weight Range: 97.9 kg Body mass index is 29.27 kg/m.   Vital Signs:   Temp:  [97.6 F (36.4 C)-99.1 F (37.3 C)] 97.6 F (36.4 C) (02/17 0353) Pulse Rate:  [76-87] 76 (02/17 0353) Resp:  [15-20] 15 (02/17 0353) BP: (96-116)/(60-78) 96/66 (02/17 0353) SpO2:  [96 %-100 %] 98 % (02/17 0910) Last BM Date: 03/19/19  Weight change: Filed Weights   03/25/19 0500 03/26/19 0500 03/27/19 0600  Weight: 102.2 kg 100.9 kg 97.9 kg    Intake/Output:   Intake/Output Summary (Last 24 hours) at 03/28/2019 0918 Last data filed at 03/28/2019 0855 Gross per 24 hour  Intake 704 ml  Output --  Net 704 ml      Physical Exam   General: NAD Neck: JVP 8-9 cm, no thyromegaly or thyroid nodule.  Lungs: Clear to auscultation bilaterally with normal respiratory effort. CV: Nondisplaced PMI.  Heart regular S1/S2, no S3/S4, no murmur.  No peripheral edema.   Abdomen: Soft, nontender, no hepatosplenomegaly, no distention.  Skin: Intact without lesions or rashes.  Neurologic: Alert and oriented x 3.  Psych: Normal affect. Extremities: No clubbing or cyanosis.  HEENT: Normal.    Telemetry   Sinus 80s Personally reviewed  Labs    CBC Recent Labs    03/26/19 0410  WBC 10.3  HGB 11.9*  HCT 37.0*  MCV 94.9  PLT 266   Basic Metabolic Panel Recent Labs    40/98/11 0530 03/28/19 0506  NA 130* 134*  K 3.9 3.8   CL 95* 99  CO2 28 28  GLUCOSE 100* 111*  BUN 10 9  CREATININE 0.66 0.63  CALCIUM 8.1* 8.5*   Liver Function Tests No results for input(s): AST, ALT, ALKPHOS, BILITOT, PROT, ALBUMIN in the last 72 hours. No results for input(s): LIPASE, AMYLASE in the last 72 hours. Cardiac Enzymes No results for input(s): CKTOTAL, CKMB, CKMBINDEX, TROPONINI in the last 72 hours.  BNP: BNP (last 3 results) Recent Labs    03/14/19 1832  BNP 1,338.6*    ProBNP (last 3 results) No results for input(s): PROBNP in the last 8760 hours.   D-Dimer No results for input(s): DDIMER in the last 72 hours. Hemoglobin A1C No results for input(s): HGBA1C in the last 72 hours. Fasting Lipid Panel Recent Labs    03/28/19 0506  CHOL 97  HDL 23*  LDLCALC 62  TRIG 60  CHOLHDL 4.2   Thyroid Function Tests No results for input(s): TSH, T4TOTAL, T3FREE, THYROIDAB in the last 72 hours.  Invalid input(s): FREET3  Other results:   Imaging    No results found.   Medications:     Scheduled Medications: . aspirin  81 mg Oral Daily  . Chlorhexidine Gluconate Cloth  6 each Topical Daily  . clopidogrel  75 mg Oral Daily  . digoxin  0.125 mg Oral Daily  . enoxaparin (LOVENOX) injection  40  mg Subcutaneous Q24H  . furosemide  40 mg Oral Daily  . insulin aspart  0-15 Units Subcutaneous Q4H  . ivabradine  5 mg Oral BID WC  . losartan  12.5 mg Oral Daily  . mouth rinse  15 mL Mouth Rinse BID  . rosuvastatin  20 mg Oral q1800  . sodium chloride flush  10-40 mL Intracatheter Q12H  . sodium chloride flush  3 mL Intravenous Q12H  . spironolactone  25 mg Oral Daily    Infusions: . sodium chloride 250 mL (03/27/19 1051)    PRN Medications: sodium chloride, acetaminophen, ondansetron (ZOFRAN) IV, ondansetron **OR** [DISCONTINUED] ondansetron (ZOFRAN) IV, Resource ThickenUp Clear, sodium chloride flush, sodium chloride flush    Assessment/Plan   1. Acute on chronic systolic CHF:  Initial  cardiogenic shock.  Prior echo in 2018 with EF 30-35%, now echo with EF down to 10-15% with severe RV dysfunction. Ischemic cardiomyopathy based on cath.  No ACS this admission (HS-TnI in the 100s range with no trend).  Initially on milrinone, now off.  Good co-ox at 76% with CVP 9-10.  Creatinine stable.   - Increase Lasix to 40 mg po bid.  - Continue digoxin 0.125 daily.   - Continue spironolactone to 25 mg daily.  - BP more stable, will add losartan 12.5 daily.  - Continue ivabradine 5 mg bid.  2. ID: Initial concern for RLL PNA on CT chest with septic shock. However, afebrile and WBCs only mildly elevated. Cultures remains negative. Remains afebrile.  Now off abx.   - No change 3. Elevated LFTs: Suspect due to shock liver with hypotension.   - Resolving 4. H/o CVA: Residual left-sided weakness as well as some cognitive dysfunction per notes.    - He continues on Plavix.  - Continue statin.   5. Hyponatremia: Improved. Restrict free water.    6. PAD: Moderate RLE vascular disease by ABIs, left normal.  7. CAD: Severe 3VD on cath. He underwent atherectomy/stenting left main into LAD and PTCA of ramus on 2/11. DFR of RCA and PL is 0.91, confirming nonhemodynamically significant lesions (plan medical therapy). Currently stable w/o CP.  - Continue ASA/Plavix.  - Continue atorvastatin, LDL 62.  8. Deconditioning: Baseline poor but lives alone.  Will need SNF. Needs PT.   Ready for SNF from my standpoint, will make followup.   Length of Stay: [place], MD  03/28/2019, 9:18 AM  Advanced Heart Failure Team Pager [phone] (M-F; [place])  Please contact [facility] for night-coverage after hours (4p -7a ) and weekends on [email or web address]

## 2019-03-28 NOTE — Progress Notes (Signed)
BP soft 88/54, MAP 65. RN notified CHF team.  Received call back, Shirlee Latch, MD ok with that BP, will continue to monitor

## 2019-03-29 LAB — GLUCOSE, CAPILLARY
Glucose-Capillary: 112 mg/dL — ABNORMAL HIGH (ref 70–99)
Glucose-Capillary: 114 mg/dL — ABNORMAL HIGH (ref 70–99)
Glucose-Capillary: 141 mg/dL — ABNORMAL HIGH (ref 70–99)
Glucose-Capillary: 144 mg/dL — ABNORMAL HIGH (ref 70–99)
Glucose-Capillary: 145 mg/dL — ABNORMAL HIGH (ref 70–99)
Glucose-Capillary: 167 mg/dL — ABNORMAL HIGH (ref 70–99)
Glucose-Capillary: 194 mg/dL — ABNORMAL HIGH (ref 70–99)

## 2019-03-29 LAB — RENAL FUNCTION PANEL
Albumin: 2.1 g/dL — ABNORMAL LOW (ref 3.5–5.0)
Anion gap: 7 (ref 5–15)
BUN: 10 mg/dL (ref 8–23)
CO2: 27 mmol/L (ref 22–32)
Calcium: 8.3 mg/dL — ABNORMAL LOW (ref 8.9–10.3)
Chloride: 99 mmol/L (ref 98–111)
Creatinine, Ser: 0.62 mg/dL (ref 0.61–1.24)
GFR calc Af Amer: >60 mL/min (ref >60)
GFR calc non Af Amer: >60 mL/min (ref >60)
Glucose, Bld: 110 mg/dL — ABNORMAL HIGH (ref 70–99)
Phosphorus: 3 mg/dL (ref 2.5–4.6)
Potassium: 3.7 mmol/L (ref 3.5–5.1)
Sodium: 133 mmol/L — ABNORMAL LOW (ref 135–145)

## 2019-03-29 LAB — CBC
HCT: 33.2 % — ABNORMAL LOW (ref 39.0–52.0)
Hemoglobin: 10.4 g/dL — ABNORMAL LOW (ref 13.0–17.0)
MCH: 30.2 pg (ref 26.0–34.0)
MCHC: 31.3 g/dL (ref 30.0–36.0)
MCV: 96.5 fL (ref 80.0–100.0)
Platelets: 295 10*3/uL (ref 150–400)
RBC: 3.44 MIL/uL — ABNORMAL LOW (ref 4.22–5.81)
RDW: 14.3 % (ref 11.5–15.5)
WBC: 8.3 10*3/uL (ref 4.0–10.5)
nRBC: 0 % (ref 0.0–0.2)

## 2019-03-29 LAB — MAGNESIUM: Magnesium: 1.6 mg/dL — ABNORMAL LOW (ref 1.7–2.4)

## 2019-03-29 LAB — SARS CORONAVIRUS 2 (TAT 6-24 HRS): SARS Coronavirus 2: NEGATIVE

## 2019-03-29 MED ORDER — IVABRADINE HCL 5 MG PO TABS: 5.0000 mg | ORAL_TABLET | Freq: Two times a day (BID) | ORAL | Status: DC

## 2019-03-29 MED ORDER — ROSUVASTATIN CALCIUM 20 MG PO TABS: 20.0000 mg | ORAL_TABLET | Freq: Every day | ORAL | Status: DC

## 2019-03-29 MED ORDER — LOSARTAN POTASSIUM 25 MG PO TABS: 12.5000 mg | ORAL_TABLET | Freq: Every day | ORAL | Status: DC

## 2019-03-29 MED ORDER — DIGOXIN 125 MCG PO TABS: 0.1250 mg | ORAL_TABLET | Freq: Every day | ORAL | Status: DC

## 2019-03-29 MED ORDER — SPIRONOLACTONE 25 MG PO TABS: 25.0000 mg | ORAL_TABLET | Freq: Every day | ORAL | Status: AC

## 2019-03-29 MED ORDER — INSULIN DETEMIR 100 UNIT/ML ~~LOC~~ SOLN: 20.0000 [IU] | Freq: Two times a day (BID) | SUBCUTANEOUS | 11 refills | Status: AC

## 2019-03-29 MED ORDER — MAGNESIUM SULFATE 2 GM/50ML IV SOLN
2.0000 g | Freq: Once | INTRAVENOUS | Status: AC
  Administered 2019-03-29: 2 g via INTRAVENOUS
  Filled 2019-03-29: qty 50

## 2019-03-29 MED ORDER — FUROSEMIDE 40 MG PO TABS: 40.0000 mg | ORAL_TABLET | Freq: Two times a day (BID) | ORAL | Status: DC

## 2019-03-29 NOTE — Progress Notes (Signed)
Left [place] central line removed per protocol and physician's order. Manual pressure applied for 5 mins. No bleeding or swelling noted. Instructed patient to remain in bed until 1730. Educated patient about S/S of infection and when to call MD; no heavy lifting or pressure on left side for 24 hours; keep dressing dry and intact until 1700 tomorrow. Pt verbalized comprehension.

## 2019-03-29 NOTE — Care Management (Signed)
Per Roni Bread. W/[employer] Co-pay amount for Corlanor 5 mg. Bid- No-co-pay  PA required @[phone] No deductible Tier 4 Retail pharmacy: [facility]. [employer] [facility].   Ref# [id number]

## 2019-03-29 NOTE — Progress Notes (Signed)
PROGRESS NOTE  Matthew Mccormick DVV:[id number] DOB: 07-Oct-1955   PCP: Kirstie Peri, MD  Patient is from: Home.  Lives alone.  DOA: 03/13/2019 LOS: 16  Brief Narrative / Interim history: 64 year old male with history of A. fib, DM-2, HTN and thrombotic CVA presented [facility] with weakness, malaise, nausea and vomiting and found to be in cardiogenic shock and transferred to [facility] for further care.  Advanced heart failure team consulted.  On 03/19/2019, he underwent R/LHC that revealed multivessel disease.  He had arthrectomy of left main, DES stent to LAD and balloon angioplasty of ostial and proximal ramus intermedius point 03/22/2019.  Subjective: No major events overnight or this morning.  No complaints this morning.  He denies chest pain, dyspnea, palpitation, GI or UTI symptoms.  Overall, he feels well.  Objective: Vitals:   03/29/19 0058 03/29/19 0424 03/29/19 1010 03/29/19 1424  BP: (!) 99/59 101/63  99/67  Pulse: 92 80 95 79  Resp: 18 15    Temp: 98 F (36.7 C) 98.4 F (36.9 C)  98.8 F (37.1 C)  TempSrc: Oral Oral  Oral  SpO2: 99% 95%  95%  Weight:      Height:        Intake/Output Summary (Last 24 hours) at 03/29/2019 1518 Last data filed at 03/29/2019 0918 Gross per 24 hour  Intake 960 ml  Output --  Net 960 ml   Filed Weights   03/25/19 0500 03/26/19 0500 03/27/19 0600  Weight: 102.2 kg 100.9 kg 97.9 kg    Examination:  GENERAL: No acute distress.  Appears well.  HEENT: MMM.  Vision and hearing grossly intact.  NECK: Supple.  No apparent JVD.  RESP: On RA.  No IWOB. Good air movement bilaterally. CVS:  RRR. Heart sounds normal.  ABD/GI/GU: Bowel sounds present. Soft. Non tender.  MSK/EXT:  Moves extremities. No apparent deformity. No edema.  SKIN: no apparent skin lesion or wound NEURO: Awake, alert and oriented appropriately.  No apparent focal neuro deficit. PSYCH: Calm. Normal affect.   Procedures:  2/8-R/LHC with multivessel  disease. 2/11-arthroscopy of left main, DES stent to proximal LAD and balloon angioplasty of ostial and proximal ramus intermedius point 03/22/2019.  Assessment & Plan: Acute on chronic systolic CHF/ICM/cardiogenic shock: Echo with EF of 10 to 15% (previously 35% in 2018) with severe RV dysfunction.  Heart catheterizations as above.  I and O incomplete.  Renal function stable. -Advanced HF recommending discharge on:  -Lasix 40 mg twice daily  -Digoxin 0.125 mg daily  -Aldactone 25 mg mg daily  -Losartan 12.5 mg daily  -Ivabradine 5 mg twice daily -Monitor fluid status, renal function and electrolytes  CAD: Severe 3VD on cath. He underwent atherectomy/stenting left main into LAD and PTCA of ramus on 2/11. DFR of RCA and PL is 0.91, confirming nonhemodynamically significant lesions (plan medical therapy). Currently stable w/o CP.  -Continue Crestor, Plavix and aspirin  Acute metabolic encephalopathy-unclear etiology.  Resolved.  He is oriented x4. -Reorientation and delirium precautions  Acute hypoxemic respiratory failure: Likely due to CHF.  Unlikely pneumonia.  Resolved.  Uncontrolled DM-2 with hyperglycemia Recent Labs    03/29/19 0420 03/29/19 0749 03/29/19 1226  GLUCAP 114* 112* 167*  -Continue current regimen  Essential hypertension: Normotensive. -Cardiac meds as above per cardiology.  History of CVA: No apparent focal neuro deficit -Continue home meds  Elevated enzymes: Likely due to cardiogenic shock.  Improved. -Recheck in the morning  Suspected PAD: ABI showed moderate RLE disease. -Continue  statin and Plavix -Outpatient follow-up with vascular surgery  Hyponatremia: Improved. -Continue monitoring  Hypomagnesemia: -Replenish and recheck.  Physical deconditioning/debility -PT/OT-SNF   Pressure Injury 03/13/19 Sacrum Stage 1 -  Intact skin with non-blanchable redness of a localized area usually over a bony prominence. (Active)  03/13/19 1700  Location:  Sacrum  Location Orientation:   Staging: Stage 1 -  Intact skin with non-blanchable redness of a localized area usually over a bony prominence.  Wound Description (Comments):   Present on Admission: Yes               DVT prophylaxis: Subcu Lovenox Code Status: Full code Family Communication: Patient and/or RN. Available if any question.  Discharge barrier: Safe disposition which would be SNF. Patient is from: Home Final disposition: SNF on 2/19  Consultants: Cardiology   Microbiology summarized: Blood cultures negative. Urine cultures negative. MRSA PCR negative. COVID-19 negative. Influenza PCR negative.  Sch Meds:  Scheduled Meds: . aspirin  81 mg Oral Daily  . Chlorhexidine Gluconate Cloth  6 each Topical Daily  . clopidogrel  75 mg Oral Daily  . digoxin  0.125 mg Oral Daily  . enoxaparin (LOVENOX) injection  40 mg Subcutaneous Q24H  . furosemide  40 mg Oral BID  . insulin aspart  0-15 Units Subcutaneous Q4H  . ivabradine  5 mg Oral BID WC  . losartan  12.5 mg Oral Daily  . mouth rinse  15 mL Mouth Rinse BID  . rosuvastatin  20 mg Oral q1800  . sodium chloride flush  10-40 mL Intracatheter Q12H  . sodium chloride flush  3 mL Intravenous Q12H  . spironolactone  25 mg Oral Daily   Continuous Infusions: . sodium chloride 250 mL (03/27/19 1051)   PRN Meds:.sodium chloride, acetaminophen, ondansetron (ZOFRAN) IV, ondansetron **OR** [DISCONTINUED] ondansetron (ZOFRAN) IV, Resource ThickenUp Clear, sodium chloride flush, sodium chloride flush  Antimicrobials: Anti-infectives (From admission, onward)   Start     Dose/Rate Route Frequency Ordered Stop   03/14/19 1200  cefTRIAXone (ROCEPHIN) 2 g in sodium chloride 0.9 % 100 mL IVPB  Status:  Discontinued     2 g 200 mL/hr over 30 Minutes Intravenous Every 24 hours 03/14/19 0951 03/15/19 0954   03/14/19 1100  azithromycin (ZITHROMAX) 500 mg in sodium chloride 0.9 % 250 mL IVPB  Status:  Discontinued     500  mg 250 mL/hr over 60 Minutes Intravenous Every 24 hours 03/14/19 0951 03/15/19 0954   03/14/19 0000  vancomycin (VANCOREADY) IVPB 1250 mg/250 mL  Status:  Discontinued     1,250 mg 166.7 mL/hr over 90 Minutes Intravenous Every 12 hours 03/13/19 1158 03/14/19 0951   03/13/19 2000  ceFEPIme (MAXIPIME) 2 g in sodium chloride 0.9 % 100 mL IVPB  Status:  Discontinued     2 g 200 mL/hr over 30 Minutes Intravenous Every 8 hours 03/13/19 1058 03/14/19 0951   03/13/19 1030  vancomycin (VANCOCIN) IVPB 1000 mg/200 mL premix  Status:  Discontinued     1,000 mg 200 mL/hr over 60 Minutes Intravenous Every 1 hr x 2 03/13/19 1001 03/13/19 1002   03/13/19 1030  vancomycin (VANCOREADY) IVPB 2000 mg/400 mL     2,000 mg 200 mL/hr over 120 Minutes Intravenous  Once 03/13/19 1002 03/13/19 1343   03/13/19 1000  ceFEPIme (MAXIPIME) 2 g in sodium chloride 0.9 % 100 mL IVPB     2 g 200 mL/hr over 30 Minutes Intravenous  Once 03/13/19 0952 03/13/19 1103   03/13/19 1000  metroNIDAZOLE (FLAGYL) IVPB 500 mg     500 mg 100 mL/hr over 60 Minutes Intravenous  Once 03/13/19 0952 03/13/19 1141   03/13/19 1000  vancomycin (VANCOCIN) IVPB 1000 mg/200 mL premix  Status:  Discontinued     1,000 mg 200 mL/hr over 60 Minutes Intravenous  Once 03/13/19 0952 03/13/19 1001       I have personally reviewed the following labs and images: CBC: Recent Labs  Lab 03/22/19 2008 03/22/19 2008 03/23/19 0358 03/24/19 0521 03/24/19 1810 03/26/19 0410 03/29/19 0557  WBC 12.1*  --  9.9 10.4  --  10.3 8.3  NEUTROABS  --   --   --  7.8*  --   --   --   HGB 13.5   < > 13.7 12.2* 12.5* 11.9* 10.4*  HCT 41.7   < > 42.3 36.7* 38.6* 37.0* 33.2*  MCV 93.9  --  94.8 91.3  --  94.9 96.5  PLT 198  --  207 196  --  266 295   < > = values in this interval not displayed.   BMP &GFR Recent Labs  Lab 03/25/19 0426 03/26/19 0410 03/27/19 0530 03/28/19 0506 03/29/19 0557  NA 130* 130* 130* 134* 133*  K 4.1 3.7 3.9 3.8 3.7  CL 92* 92*  95* 99 99  CO2 29 26 28 28 27   GLUCOSE 99 107* 100* 111* 110*  BUN 11 11 10 9 10   CREATININE 0.78 0.70 0.66 0.63 0.62  CALCIUM 8.4* 8.4* 8.1* 8.5* 8.3*  MG  --   --   --   --  1.6*  PHOS  --   --   --   --  3.0   Estimated Creatinine Clearance: 114.6 mL/min (by C-G formula based on SCr of 0.62 mg/dL). Liver & Pancreas: Recent Labs  Lab 03/29/19 0557  ALBUMIN 2.1*   No results for input(s): LIPASE, AMYLASE in the last 168 hours. No results for input(s): AMMONIA in the last 168 hours. Diabetic: No results for input(s): HGBA1C in the last 72 hours. Recent Labs  Lab 03/28/19 2146 03/29/19 0042 03/29/19 0420 03/29/19 0749 03/29/19 1226  GLUCAP 233* 144* 114* 112* 167*   Cardiac Enzymes: No results for input(s): CKTOTAL, CKMB, CKMBINDEX, TROPONINI in the last 168 hours. No results for input(s): PROBNP in the last 8760 hours. Coagulation Profile: No results for input(s): INR, PROTIME in the last 168 hours. Thyroid Function Tests: No results for input(s): TSH, T4TOTAL, FREET4, T3FREE, THYROIDAB in the last 72 hours. Lipid Profile: Recent Labs    03/28/19 0506  CHOL 97  HDL 23*  LDLCALC 62  TRIG 60  CHOLHDL 4.2   Anemia Panel: No results for input(s): VITAMINB12, FOLATE, FERRITIN, TIBC, IRON, RETICCTPCT in the last 72 hours. Urine analysis:    Component Value Date/Time   COLORURINE AMBER (A) 03/14/2019 0600   APPEARANCEUR HAZY (A) 03/14/2019 0600   LABSPEC 1.032 (H) 03/14/2019 0600   PHURINE 5.0 03/14/2019 0600   GLUCOSEU >=500 (A) 03/14/2019 0600   HGBUR NEGATIVE 03/14/2019 0600   BILIRUBINUR NEGATIVE 03/14/2019 0600   KETONESUR 5 (A) 03/14/2019 0600   PROTEINUR 30 (A) 03/14/2019 0600   NITRITE NEGATIVE 03/14/2019 0600   LEUKOCYTESUR NEGATIVE 03/14/2019 0600   Sepsis Labs: Invalid input(s): PROCALCITONIN, [place]  Microbiology: Recent Results (from the past 240 hour(s))  MRSA PCR Screening     Status: None   Collection Time: 03/21/19  7:03 PM    Specimen: Nasal Mucosa; Nasopharyngeal  Result Value Ref  Range Status   MRSA by PCR NEGATIVE NEGATIVE Final    Comment:        The GeneXpert MRSA Assay (FDA approved for NASAL specimens only), is one component of a comprehensive MRSA colonization surveillance program. It is not intended to diagnose MRSA infection nor to guide or monitor treatment for MRSA infections. Performed at [facility], [address], [place], [place] [address]   SARS CORONAVIRUS 2 (TAT 6-24 HRS) Nasopharyngeal Nasopharyngeal Swab     Status: None   Collection Time: 03/29/19  2:25 AM   Specimen: Nasopharyngeal Swab  Result Value Ref Range Status   SARS Coronavirus 2 NEGATIVE NEGATIVE Final    Comment: (NOTE) SARS-CoV-2 target nucleic acids are NOT DETECTED. The SARS-CoV-2 RNA is generally detectable in upper and lower respiratory specimens during the acute phase of infection. Negative results do not preclude SARS-CoV-2 infection, do not rule out co-infections with other pathogens, and should not be used as the sole basis for treatment or other patient management decisions. Negative results must be combined with clinical observations, patient history, and epidemiological information. The expected result is Negative. Fact Sheet for Patients: HairSlick.no Fact Sheet for Healthcare Providers: quierodirigir.com This test is not yet approved or cleared by the [place] FDA and  has been authorized for detection and/or diagnosis of SARS-CoV-2 by FDA under an Emergency Use Authorization (EUA). This EUA will remain  in effect (meaning this test can be used) for the duration of the COVID-19 declaration under Section 56 4(b)(1) of the Act, 21 U.S.C. section 360bbb-3(b)(1), unless the authorization is terminated or revoked sooner. Performed at [facility], [address], [place], [place] [address]     Radiology Studies: No results  found.  Jaylend Reiland T. Maxen Rowland [facility]  If 7PM-7AM, please contact night-coverage [email or web address] Password [facility] 03/29/2019, 3:18 PM

## 2019-03-29 NOTE — TOC Progression Note (Signed)
Transition of Care [facility]) - Progression Note    Patient Details  Name: Matthew Mccormick MRN: [id number] Date of Birth: 02/21/1955  Transition of Care [facility]) CM/SW Contact  Eduard Roux, [place] Phone Number: 03/29/2019, 2:17 PM  Clinical Narrative:    CSW contacted patient via phone and informed of bed offers. Patient requested to call his relative,Dinah. CSW spoke with [name], and she selected [employer].  CSW contacted [facility] - SNF confirmed bed offer and states they can admit patient tomorrow.   Antony Blackbird, MSW, LCSWA Clinical Social Worker   Expected Discharge Plan: Home w Home Health Services Barriers to Discharge: Continued Medical Work up  Expected Discharge Plan and Services Expected Discharge Plan: Home w Home Health Services       Living arrangements for the past 2 months: Apartment                                       Social Determinants of Health (SDOH) Interventions    Readmission Risk Interventions No flowsheet data found.

## 2019-03-29 NOTE — Progress Notes (Signed)
Patient ID: Matthew Mccormick, male   DOB: [birth year], 65 y.o.   MRN: [id number] P    Advanced Heart Failure Rounding Note  PCP-Cardiologist: Kate Sable, MD   Subjective:    S/p complex PCI with atherectomy of the left main 2/11 using hemodynamic support with Impella. Impella removed. Returned to cath lab 2/15 for possible PCI of lesions in the ostial/proximal RCA and PLA branches, however DFR measured at 0.91. Lesions not hemodynamically significant. No indication for PCI RCA/PLB.   Stable today, no complaints.  CVP 8.  SBP 90s-100s, no lightheadedness.   Objective:   Weight Range: 97.9 kg Body mass index is 29.27 kg/m.   Vital Signs:   Temp:  [98 F (36.7 C)-98.4 F (36.9 C)] 98.4 F (36.9 C) (02/18 0424) Pulse Rate:  [75-92] 80 (02/18 0424) Resp:  [0-18] 15 (02/18 0424) BP: (88-101)/(54-63) 101/63 (02/18 0424) SpO2:  [95 %-100 %] 95 % (02/18 0424) Last BM Date: (pt can't remember, last charted was 03/19/19)  Weight change: Filed Weights   03/25/19 0500 03/26/19 0500 03/27/19 0600  Weight: 102.2 kg 100.9 kg 97.9 kg    Intake/Output:   Intake/Output Summary (Last 24 hours) at 03/29/2019 0950 Last data filed at 03/29/2019 0918 Gross per 24 hour  Intake 1320 ml  Output --  Net 1320 ml      Physical Exam   General: NAD Neck: No JVD, no thyromegaly or thyroid nodule.  Lungs: Clear to auscultation bilaterally with normal respiratory effort. CV: Nondisplaced PMI.  Heart regular S1/S2, no S3/S4, no murmur.  No peripheral edema.   Abdomen: Soft, nontender, no hepatosplenomegaly, no distention.  Skin: Intact without lesions or rashes.  Neurologic: Alert and oriented x 3.  Psych: Normal affect. Extremities: No clubbing or cyanosis.  HEENT: Normal.    Telemetry   Sinus 80s Personally reviewed  Labs    CBC Recent Labs    03/29/19 0557  WBC 8.3  HGB 10.4*  HCT 33.2*  MCV 96.5  PLT 725   Basic Metabolic Panel Recent Labs    03/28/19 0506  03/29/19 0557  NA 134* 133*  K 3.8 3.7  CL 99 99  CO2 28 27  GLUCOSE 111* 110*  BUN 9 10  CREATININE 0.63 0.62  CALCIUM 8.5* 8.3*  MG  --  1.6*  PHOS  --  3.0   Liver Function Tests Recent Labs    03/29/19 0557  ALBUMIN 2.1*   No results for input(s): LIPASE, AMYLASE in the last 72 hours. Cardiac Enzymes No results for input(s): CKTOTAL, CKMB, CKMBINDEX, TROPONINI in the last 72 hours.  BNP: BNP (last 3 results) Recent Labs    03/14/19 1832  BNP 1,338.6*    ProBNP (last 3 results) No results for input(s): PROBNP in the last 8760 hours.   D-Dimer No results for input(s): DDIMER in the last 72 hours. Hemoglobin A1C No results for input(s): HGBA1C in the last 72 hours. Fasting Lipid Panel Recent Labs    03/28/19 0506  CHOL 97  HDL 23*  LDLCALC 62  TRIG 60  CHOLHDL 4.2   Thyroid Function Tests No results for input(s): TSH, T4TOTAL, T3FREE, THYROIDAB in the last 72 hours.  Invalid input(s): FREET3  Other results:   Imaging    No results found.   Medications:     Scheduled Medications: . aspirin  81 mg Oral Daily  . Chlorhexidine Gluconate Cloth  6 each Topical Daily  . clopidogrel  75 mg Oral Daily  . digoxin  0.125 mg Oral Daily  . enoxaparin (LOVENOX) injection  40 mg Subcutaneous Q24H  . furosemide  40 mg Oral BID  . insulin aspart  0-15 Units Subcutaneous Q4H  . ivabradine  5 mg Oral BID WC  . losartan  12.5 mg Oral Daily  . mouth rinse  15 mL Mouth Rinse BID  . rosuvastatin  20 mg Oral q1800  . sodium chloride flush  10-40 mL Intracatheter Q12H  . sodium chloride flush  3 mL Intravenous Q12H  . spironolactone  25 mg Oral Daily    Infusions: . sodium chloride 250 mL (03/27/19 1051)    PRN Medications: sodium chloride, acetaminophen, ondansetron (ZOFRAN) IV, ondansetron **OR** [DISCONTINUED] ondansetron (ZOFRAN) IV, Resource ThickenUp Clear, sodium chloride flush, sodium chloride flush    Assessment/Plan   1. Acute on chronic  systolic CHF:  Initial cardiogenic shock.  Prior echo in 2018 with EF 30-35%, now echo with EF down to 10-15% with severe RV dysfunction. Ischemic cardiomyopathy based on cath.  No ACS this admission (HS-TnI in the 100s range with no trend).  Initially on milrinone, now off.  CVP 8 today.  Creatinine stable.  No BP room to titrate meds.  - Continue Lasix 40 mg po bid.  - Continue digoxin 0.125 daily.   - Continue spironolactone to 25 mg daily.  - BP more stable, will add losartan 12.5 daily.  - Continue ivabradine 5 mg bid.  2. ID: Initial concern for RLL PNA on CT chest with septic shock. However, afebrile and WBCs only mildly elevated. Cultures remains negative. Remains afebrile.  Now off abx.   - No change 3. Elevated LFTs: Suspect due to shock liver with hypotension.   - Resolving 4. H/o CVA: Residual left-sided weakness as well as some cognitive dysfunction per notes.    - He continues on Plavix.  - Continue statin.   5. Hyponatremia: Improved. Restrict free water.    6. PAD: Moderate RLE vascular disease by ABIs, left normal.  7. CAD: Severe 3VD on cath. He underwent atherectomy/stenting left main into LAD and PTCA of ramus on 2/11. DFR of RCA and PL is 0.91, confirming nonhemodynamically significant lesions (plan medical therapy). Currently stable w/o CP.  - Continue ASA/Plavix.  - Continue Crestor 20, LDL 62.  8. Deconditioning: Baseline poor but lives alone.  Will need SNF. Needs PT.   Ready for SNF from my standpoint, will make sure he has followup. Cardiac meds for discharge: Lasix 40 mg bid, spironolactone 25 daily, losartan 12.5 daily, digoxin 0.125 daily, ivabradine 5 bid, Crestor 20 daily, ASA 81, Plavix 75.   Length of Stay: 46  Marca Ancona, MD  03/29/2019, 9:50 AM  Advanced Heart Failure Team Pager [phone] (M-F; 7a - 4p)  Please contact [facility] for night-coverage after hours (4p -7a ) and weekends on [email or web address]

## 2019-03-29 NOTE — Care Management (Signed)
1129 03-29-19 Benefits Check submitted for cost for Corlanor 5 mg BID. Gala Lewandowsky, RN,BSN Case Manager [phone]

## 2019-03-30 DIAGNOSIS — I739 Peripheral vascular disease, unspecified: Secondary | ICD-10-CM | POA: Diagnosis not present

## 2019-03-30 DIAGNOSIS — G9341 Metabolic encephalopathy: Secondary | ICD-10-CM | POA: Diagnosis not present

## 2019-03-30 DIAGNOSIS — I5022 Chronic systolic (congestive) heart failure: Secondary | ICD-10-CM | POA: Diagnosis not present

## 2019-03-30 DIAGNOSIS — R570 Cardiogenic shock: Secondary | ICD-10-CM | POA: Diagnosis not present

## 2019-03-30 DIAGNOSIS — I10 Essential (primary) hypertension: Secondary | ICD-10-CM | POA: Diagnosis present

## 2019-03-30 DIAGNOSIS — N189 Chronic kidney disease, unspecified: Secondary | ICD-10-CM | POA: Diagnosis not present

## 2019-03-30 DIAGNOSIS — I255 Ischemic cardiomyopathy: Secondary | ICD-10-CM

## 2019-03-30 DIAGNOSIS — I110 Hypertensive heart disease with heart failure: Secondary | ICD-10-CM | POA: Diagnosis not present

## 2019-03-30 DIAGNOSIS — I2510 Atherosclerotic heart disease of native coronary artery without angina pectoris: Secondary | ICD-10-CM | POA: Diagnosis not present

## 2019-03-30 DIAGNOSIS — F419 Anxiety disorder, unspecified: Secondary | ICD-10-CM | POA: Diagnosis not present

## 2019-03-30 DIAGNOSIS — M6281 Muscle weakness (generalized): Secondary | ICD-10-CM | POA: Diagnosis not present

## 2019-03-30 DIAGNOSIS — E1165 Type 2 diabetes mellitus with hyperglycemia: Secondary | ICD-10-CM | POA: Diagnosis not present

## 2019-03-30 DIAGNOSIS — R2689 Other abnormalities of gait and mobility: Secondary | ICD-10-CM | POA: Diagnosis not present

## 2019-03-30 DIAGNOSIS — I5023 Acute on chronic systolic (congestive) heart failure: Secondary | ICD-10-CM | POA: Diagnosis not present

## 2019-03-30 DIAGNOSIS — R41841 Cognitive communication deficit: Secondary | ICD-10-CM | POA: Diagnosis not present

## 2019-03-30 DIAGNOSIS — I509 Heart failure, unspecified: Secondary | ICD-10-CM | POA: Diagnosis not present

## 2019-03-30 DIAGNOSIS — I25110 Atherosclerotic heart disease of native coronary artery with unstable angina pectoris: Secondary | ICD-10-CM

## 2019-03-30 DIAGNOSIS — I69319 Unspecified symptoms and signs involving cognitive functions following cerebral infarction: Secondary | ICD-10-CM | POA: Diagnosis not present

## 2019-03-30 DIAGNOSIS — Z7982 Long term (current) use of aspirin: Secondary | ICD-10-CM | POA: Diagnosis not present

## 2019-03-30 DIAGNOSIS — E119 Type 2 diabetes mellitus without complications: Secondary | ICD-10-CM | POA: Diagnosis not present

## 2019-03-30 DIAGNOSIS — Z7902 Long term (current) use of antithrombotics/antiplatelets: Secondary | ICD-10-CM | POA: Diagnosis not present

## 2019-03-30 DIAGNOSIS — I4891 Unspecified atrial fibrillation: Secondary | ICD-10-CM | POA: Diagnosis not present

## 2019-03-30 DIAGNOSIS — E1122 Type 2 diabetes mellitus with diabetic chronic kidney disease: Secondary | ICD-10-CM | POA: Diagnosis not present

## 2019-03-30 DIAGNOSIS — I130 Hypertensive heart and chronic kidney disease with heart failure and stage 1 through stage 4 chronic kidney disease, or unspecified chronic kidney disease: Secondary | ICD-10-CM | POA: Diagnosis not present

## 2019-03-30 DIAGNOSIS — Z87891 Personal history of nicotine dependence: Secondary | ICD-10-CM | POA: Diagnosis not present

## 2019-03-30 DIAGNOSIS — I69354 Hemiplegia and hemiparesis following cerebral infarction affecting left non-dominant side: Secondary | ICD-10-CM | POA: Diagnosis not present

## 2019-03-30 DIAGNOSIS — Z955 Presence of coronary angioplasty implant and graft: Secondary | ICD-10-CM | POA: Diagnosis not present

## 2019-03-30 DIAGNOSIS — Z79899 Other long term (current) drug therapy: Secondary | ICD-10-CM | POA: Diagnosis not present

## 2019-03-30 DIAGNOSIS — Z794 Long term (current) use of insulin: Secondary | ICD-10-CM | POA: Diagnosis not present

## 2019-03-30 DIAGNOSIS — Z9861 Coronary angioplasty status: Secondary | ICD-10-CM | POA: Diagnosis not present

## 2019-03-30 DIAGNOSIS — E1151 Type 2 diabetes mellitus with diabetic peripheral angiopathy without gangrene: Secondary | ICD-10-CM | POA: Diagnosis not present

## 2019-03-30 DIAGNOSIS — R5381 Other malaise: Secondary | ICD-10-CM | POA: Diagnosis not present

## 2019-03-30 DIAGNOSIS — E785 Hyperlipidemia, unspecified: Secondary | ICD-10-CM | POA: Diagnosis not present

## 2019-03-30 DIAGNOSIS — E871 Hypo-osmolality and hyponatremia: Secondary | ICD-10-CM | POA: Diagnosis not present

## 2019-03-30 DIAGNOSIS — K759 Inflammatory liver disease, unspecified: Secondary | ICD-10-CM | POA: Diagnosis not present

## 2019-03-30 DIAGNOSIS — F410 Panic disorder [episodic paroxysmal anxiety] without agoraphobia: Secondary | ICD-10-CM | POA: Diagnosis not present

## 2019-03-30 LAB — CBC
HCT: 35.9 % — ABNORMAL LOW (ref 39.0–52.0)
Hemoglobin: 11.8 g/dL — ABNORMAL LOW (ref 13.0–17.0)
MCH: 30.9 pg (ref 26.0–34.0)
MCHC: 32.9 g/dL (ref 30.0–36.0)
MCV: 94 fL (ref 80.0–100.0)
Platelets: 334 10*3/uL (ref 150–400)
RBC: 3.82 MIL/uL — ABNORMAL LOW (ref 4.22–5.81)
RDW: 14.4 % (ref 11.5–15.5)
WBC: 9.6 10*3/uL (ref 4.0–10.5)
nRBC: 0 % (ref 0.0–0.2)

## 2019-03-30 LAB — GLUCOSE, CAPILLARY
Glucose-Capillary: 136 mg/dL — ABNORMAL HIGH (ref 70–99)
Glucose-Capillary: 170 mg/dL — ABNORMAL HIGH (ref 70–99)
Glucose-Capillary: 142 mg/dL — ABNORMAL HIGH (ref 70–99)

## 2019-03-30 LAB — RENAL FUNCTION PANEL
Albumin: 2.5 g/dL — ABNORMAL LOW (ref 3.5–5.0)
Anion gap: 11 (ref 5–15)
BUN: 14 mg/dL (ref 8–23)
CO2: 23 mmol/L (ref 22–32)
Calcium: 8.5 mg/dL — ABNORMAL LOW (ref 8.9–10.3)
Chloride: 98 mmol/L (ref 98–111)
Creatinine, Ser: 0.84 mg/dL (ref 0.61–1.24)
GFR calc Af Amer: >60 mL/min (ref >60)
GFR calc non Af Amer: >60 mL/min (ref >60)
Glucose, Bld: 154 mg/dL — ABNORMAL HIGH (ref 70–99)
Phosphorus: 3.3 mg/dL (ref 2.5–4.6)
Potassium: 4.2 mmol/L (ref 3.5–5.1)
Sodium: 132 mmol/L — ABNORMAL LOW (ref 135–145)

## 2019-03-30 LAB — MAGNESIUM: Magnesium: 1.8 mg/dL (ref 1.7–2.4)

## 2019-03-30 NOTE — Progress Notes (Signed)
[phone] Pt drowsy during visit but able to answer questions. Encouraged pt to have care giver who comes in 6 days a week (per pt) to read materials left at bedside. Discussed with pt the importance of plavix with stent. Reviewed watching sodium with low EF. Left CHF booklet, diabetic, heart healthy and low sodium diets , and gave pt stent card. Did not review NTG use as pt did not seem to comprehend use. Discussed with pt the need to weigh every day. He stated someone helps him weigh at home but he is not sure who it is. Referral placed to CRP 2 Hydro to meet requirement but pt not physically able to do at this time.

## 2019-03-30 NOTE — Progress Notes (Signed)
Patient ID: Matthew Mccormick, male   DOB: 1955/06/09, 64 y.o.   MRN: [id number]  To SNF today.  Stable, continue current cardiac plan.  See yesterday's note for d/c meds.   Marca Ancona 03/30/2019 7:56 AM

## 2019-03-30 NOTE — TOC Transition Note (Signed)
Transition of Care [facility]) - CM/SW Discharge Note   Patient Details  Name: Matthew Mccormick MRN: [id number] Date of Birth: [birth year]  Transition of Care [facility]) CM/SW Contact:  Nonda Lou, [place] Phone Number: [phone] 03/30/2019, 9:53 AM   Clinical Narrative:    Patient will DC to: [facility] Anticipated DC date: 03/30/19 Family notified: [name] [phone] Transport by: Sharin Mons   Per MD patient ready for DC to [facility]. RN, patient, patient's family, and facility notified of DC. Discharge Summary and FL2 sent to facility. RN to call report prior to discharge [phone]). DC packet on chart. Ambulance transport requested for patient.   CSW will sign off for now as social work intervention is no longer needed. Please consult [place] again if new needs arise.    Final next level of care: Skilled Nursing Facility Barriers to Discharge: No Barriers Identified   Patient Goals and CMS Choice Patient states their goals for this hospitalization and ongoing recovery are:: to return home CMS Medicare.gov Compare Post Acute Care list provided to:: Patient Represenative (must comment)([place]inah) Choice offered to / list presented to : Patient  Discharge Placement                       Discharge Plan and Services                                     Social Determinants of Health (SDOH) Interventions     Readmission Risk Interventions No flowsheet data found.

## 2019-03-30 NOTE — Care Management Important Message (Signed)
Important Message  Patient Details  Name: Matthew Mccormick MRN: [id number] Date of Birth: January 24, 1956   Medicare Important Message Given:  Yes     Renie Ora 03/30/2019, 10:23 AM

## 2019-03-30 NOTE — Discharge Instructions (Signed)
Heart Failure Action Plan A heart failure action plan helps you understand what to do when you have symptoms of heart failure. Follow the plan that was created by you and your health care provider. Review your plan each time you visit your health care provider. Red zone These signs and symptoms mean you should get medical help right away:  You have trouble breathing when resting.  You have a dry cough that is getting worse.  You have swelling or pain in your legs or abdomen that is getting worse.  You suddenly gain more than 2-3 lb (0.9-1.4 kg) in a day, or more than 5 lb (2.3 kg) in one week. This amount may be more or less depending on your condition.  You have trouble staying awake or you feel confused.  You have chest pain.  You do not have an appetite.  You pass out. If you experience any of these symptoms:  Call your local emergency services (911 in the [place] right away or seek help at the emergency department of the nearest hospital. Yellow zone These signs and symptoms mean your condition may be getting worse and you should make some changes:  You have trouble breathing when you are active or you need to sleep with extra pillows.  You have swelling in your legs or abdomen.  You gain 2-3 lb (0.9-1.4 kg) in one day, or 5 lb (2.3 kg) in one week. This amount may be more or less depending on your condition.  You get tired easily.  You have trouble sleeping.  You have a dry cough. If you experience any of these symptoms:  Contact your health care provider within the next day.  Your health care provider may adjust your medicines. Green zone These signs mean you are doing well and can continue what you are doing:  You do not have shortness of breath.  You have very little swelling or no new swelling.  Your weight is stable (no gain or loss).  You have a normal activity level.  You do not have chest pain or any other new symptoms. Follow these instructions at  home:  Take over-the-counter and prescription medicines only as told by your health care provider.  Weigh yourself daily. Your target weight is __________ lb (__________ kg). ? Call your health care provider if you gain more than __________ lb (__________ kg) in a day, or more than __________ lb (__________ kg) in one week.  Eat a heart-healthy diet. Work with a diet and nutrition specialist (dietitian) to create an eating plan that is best for you.  Keep all follow-up visits as told by your health care provider. This is important. Where to find more information  American [employer] Association: www.heart.org Summary  Follow the action plan that was created by you and your health care provider.  Get help right away if you have any symptoms in the Red zone. This information is not intended to replace advice given to you by your health care provider. Make sure you discuss any questions you have with your health care provider. Document Revised: 01/07/2017 Document Reviewed: 03/06/2016 [employer]vier Patient Education  2020 [employer]  

## 2019-03-30 NOTE — Progress Notes (Signed)
PT Cancellation Note  Patient Details Name: Matthew Mccormick MRN: [id number] DOB: 10-Sep-1955   Cancelled Treatment:    Reason Eval/Treat Not Completed: Other (comment) DC to SNF today. Holding per dept policy.   Madelaine Etienne, DPT, PN1   Supplemental Physical Therapist [facility]    Pager [phone] Acute Rehab Office [phone]

## 2019-03-30 NOTE — Discharge Summary (Signed)
Physician Discharge Summary  Matthew Mccormick:[id number] DOB: 08/01/1955 DOA: 03/13/2019  PCP: Kirstie Peri, MD  Admit date: 03/13/2019 Discharge date: 03/30/2019  Admitted From: Home Disposition: SNF  Recommendations for Outpatient Follow-up:  1. Follow ups as below. 2. Please obtain CBC/CMP/Mag at follow up 3. Please follow up on the following pending results: None   Discharge Condition: Stable CODE STATUS: Full code  Follow-up Information    [facility] Follow up on 04/09/2019.   Specialty: Cardiology Why: Advanced [facility] Failure [facility] (Dr. Alford Highland office) 10:30 AM, parking garage code [phone]  Contact information: [address] [id number] mc [place] [address] [phone]          Hospital Course: 64 year old male with history of A. fib, IDDM-2, HTN and thrombotic CVA presented to [facility] with weakness, malaise, nausea and vomiting and found to be in cardiogenic shock and transferred to [facility] for further care.   On arrival to [facility], advanced heart failure team consulted.  Cardiogenic shock thought to be due to acute on chronic systolic CHF/ICM.  Echo with EF of 10 to 15% (previously 35% in 2018) and severe RV dysfunction. He underwent R/LHC that revealed multivessel disease on 03/19/2019.  He had arthrectomy of left main, DES stent to LAD and balloon angioplasty of ostial and proximal ramus intermedius point 03/22/2019. He was aggressively diuresed with significant improvement in his symptoms and respiratory status.  Eventually cleared by advanced heart failure team for discharge and outpatient follow-up.  Patient was evaluated by PT/OT who recommended SNF.  See individual problem list below for more hospital course. Discharge Diagnoses:  Acute on chronic systolic CHF/ICM/cardiogenic shock: Echo with EF of 10 to 15% (previously 35% in 2018) with severe RV dysfunction.  Heart catheterizations  as above.  I and O incomplete.  Renal function stable.  Weight down from 237 pounds to 211 pounds. -Advanced HF recommending discharge on:             -Lasix 40 mg twice daily             -Digoxin 0.125 mg daily             -Aldactone 25 mg mg daily             -Losartan 12.5 mg daily             -Ivabradine 5 mg twice daily -Fluid restriction to 1500 cc a day and sodium restriction to less than 2 g a day. -Daily weight.  Repeat renal function and electrolytes in 1 week. -Outpatient follow-up with cardiology as scheduled.  CAD: Severe 3VD on cath. He underwentatherectomy/stenting left main into LAD and PTCA of ramus on 2/11. DFR of RCA and PL is 0.91, confirming nonhemodynamically significant lesions (plan medical therapy). Currently stable w/o CP.  -Continue Crestor, Plavix and aspirin  Acute metabolic encephalopathy-unclear etiology.  Resolved.  He is oriented x4. -Reorientation and delirium precautions  Acute hypoxemic respiratory failure: Likely due to CHF.  Unlikely pneumonia.  Resolved.  Uncontrolled DM-2 with hyperglycemia Recent Labs    03/29/19 2114 03/29/19 2353 03/30/19 0405  GLUCAP 194* 141* 136*  -Discharged on Levemir 20 units twice daily, Metformin 1000 mg twice daily and Jardiance 10 mg daily. -Monitor CBG  Essential hypertension: Normotensive. -Cardiac meds as above per cardiology.  History of CVA: No apparent focal neuro deficit -Continue home meds  Elevated enzymes: Likely due to cardiogenic shock.  Improved. -Repeat LFT  in 1 week.  Suspected PAD: ABI showed moderate RLE disease. -Continue statin and Plavix -Outpatient follow-up with vascular surgery  Hyponatremia: Stable. -Recheck in a week.  Hypomagnesemia: Resolved. -Recheck in a week.  Physical deconditioning/debility -PT/OT-SNF   Pressure Injury 03/13/19 Sacrum Stage 1 -  Intact skin with non-blanchable redness of a localized area usually over a bony prominence. (Active)    03/13/19 1700  Location: Sacrum  Location Orientation:   Staging: Stage 1 -  Intact skin with non-blanchable redness of a localized area usually over a bony prominence.  Wound Description (Comments):   Present on Admission: Yes    Discharge Instructions  Discharge Instructions    Amb Referral to Cardiac Rehabilitation   Complete by: As directed    Diagnosis: Coronary Stents   After initial evaluation and assessments completed: Virtual Based Care may be provided alone or in conjunction with Phase 2 Cardiac Rehab based on patient barriers.: Yes   Diet - low sodium heart healthy   Complete by: As directed    Diet Carb Modified   Complete by: As directed    Increase activity slowly   Complete by: As directed      Allergies as of 03/30/2019   No Known Allergies     Medication List    STOP taking these medications   amLODipine 10 MG tablet Commonly known as: NORVASC   atenolol 100 MG tablet Commonly known as: TENORMIN   atorvastatin 40 MG tablet Commonly known as: LIPITOR   carvedilol 3.125 MG tablet Commonly known as: COREG   cephALEXin 500 MG capsule Commonly known as: KEFLEX   lisinopril 10 MG tablet Commonly known as: ZESTRIL   potassium chloride 10 MEQ tablet Commonly known as: KLOR-CON     TAKE these medications   aspirin EC 81 MG tablet Take 81 mg by mouth daily.   clopidogrel 75 MG tablet Commonly known as: PLAVIX Take 75 mg by mouth daily.   digoxin 0.125 MG tablet Commonly known as: LANOXIN Take 1 tablet (0.125 mg total) by mouth daily.   empagliflozin 10 MG Tabs tablet Commonly known as: JARDIANCE Take 10 mg by mouth daily.   furosemide 40 MG tablet Commonly known as: LASIX Take 1 tablet (40 mg total) by mouth 2 (two) times daily.   insulin detemir 100 UNIT/ML injection Commonly known as: LEVEMIR Inject 0.2 mLs (20 Units total) into the skin 2 (two) times daily. What changed:   how much to take  when to take this   ivabradine 5  MG Tabs tablet Commonly known as: CORLANOR Take 1 tablet (5 mg total) by mouth 2 (two) times daily with a meal.   losartan 25 MG tablet Commonly known as: COZAAR Take 0.5 tablets (12.5 mg total) by mouth daily.   metFORMIN 1000 MG tablet Commonly known as: GLUCOPHAGE Take 1,000 mg by mouth 2 (two) times daily with a meal.   rosuvastatin 20 MG tablet Commonly known as: CRESTOR Take 1 tablet (20 mg total) by mouth daily at 6 PM.   spironolactone 25 MG tablet Commonly known as: ALDACTONE Take 1 tablet (25 mg total) by mouth daily.       Consultations:  Advanced heart failure team  Procedures/Studies:  2D Echo on 03/14/2019 1. Left ventricular ejection fraction, by visual estimation, is 20 to  25%. The left ventricle has severely decreased function. There is no left  ventricular hypertrophy.  2. Definity contrast agent was given IV to delineate the left ventricular  endocardial borders.  3. Elevated left ventricular end-diastolic pressure.  4. Indeterminate diastolic filling due to E-A fusion.  5. The left ventricle demonstrates global hypokinesis.  6. Global right ventricle has severely reduced systolic function.The  right ventricular size is mildly enlarged. No increase in right  ventricular wall thickness.  7. Left atrial size was normal.  8. Right atrial size was mildly dilated.  9. Mild mitral annular calcification.  10. The mitral valve is grossly normal. Mild mitral valve regurgitation.  11. The tricuspid valve is normal in structure.  12. The tricuspid valve is normal in structure. Tricuspid valve  regurgitation is mild.  13. The aortic valve is tricuspid. Aortic valve regurgitation is not  visualized. No evidence of aortic valve sclerosis or stenosis.  14. The pulmonic valve was grossly normal. Pulmonic valve regurgitation is  not visualized.  15. Moderately elevated pulmonary artery systolic pressure.  16. The inferior vena cava is dilated in size with  >50% respiratory  variability, suggesting right atrial pressure of 8 mmHg.    R/LHC on 03/19/2019 1. Filling pressures remain mildly elevated.  2. Mild pulmonary venous hypertension.   3. Low cardiac index around 2.  4. Severe 3 vessel coronary disease.    I do not think that he is going to be a good CABG candidate due to poor functional capacity at baseline.  Discussed with Dr. Herbie Baltimore, may be candidate for atherectomy/stenting left main into LAD, ramus, PLV (and ostial RCA depending on flow wire results).  Will discuss with colleagues.  Would need Impella support.   LHC on 03/22/2019  Mid LM to Prox LAD lesion is 80% stenosed with 65% stenosed side branch in Ramus. Prox LAD to Mid LAD lesion is 70% stenosed.  A drug-eluting stent was successfully placed from the LEFT MAIN into the proximal LAD using a SYNERGY XD 3.0X38. = The stent was upsized to 4.3 mm in the left main and 3.3 mm in the LAD  Post intervention, there is a 0% residual stenosis in the LEFT MAIN and LAD.  Post PTCA intervention, the side branch was reduced to 10% residual stenosis.  Ramus-1 lesion is 70% stenosed. Post PTCA 2.0 mm balloon intervention, there is a 35% residual stenosis.  Ramus-2 lesion is 75% stenosed. This lesion was not successfully crossed. Post intervention, there is a 70% residual stenosis.  1st Diag lesion is 90% stenosed.  Dist LAD lesion is 99% stenosed. Post intervention (simply with the guidewire advanced to the distal LAD, the vessel recanalized), there is a 80% residual stenosis.  A drug-eluting stent was successfully placed using a SYNERGY XD 3.0X38.   SUMMARY  Successful atherectomy based PCI of LEFT MAIN-PROXIMAL LAD with placement of 3.0 mm x 38 mm Synergy DES stent postdilated to 4.3 mm in the LEFT MAIN and 3.3 mm in the LAD.  Balloon angioplasty only of the ostial and proximal ramus intermedius (unable to pass one 0 mm balloon beyond the lesion)  RECOMMENDATIONS  Patient be  transferred back to CCU.  We have applied FemoStop on the left femoral access site which appears to be well controlled.  Plan will be to maintain FemoStop at low pressure for roughly 1 hour and then begin to wean off.  Would consider evaluation and treatment of the RCA lesions at a later date, but would allow for time to recover from this procedure over the weekend.  Given concern for possible blood loss during sheath exchange, we have written for 1 unit transfusion PRBC.   Bryan Lemma, MD  LHC on 03/26/2019 Moderate obstructive disease of the proximal/ostial RCA as well as a large posterolateral branch, both lesions interrogated with DFR and are shown to not be hemodynamically significant.  Recommend continued medical therapy.  CT Head Wo Contrast  Result Date: 03/13/2019 CLINICAL DATA:  Headache EXAM: CT HEAD WITHOUT CONTRAST TECHNIQUE: Contiguous axial images were obtained from the base of the skull through the vertex without intravenous contrast. COMPARISON:  03/09/2019 FINDINGS: Brain: No evidence of acute infarction, hemorrhage, hydrocephalus, extra-axial collection or mass lesion/mass effect. 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 Additional areas of encephalomalacia in the left frontal and parietal lobes are stable from prior. Scattered low-density changes within the periventricular and subcortical white matter compatible with chronic microvascular ischemic change. Mild diffuse cerebral volume loss. Vascular: Mild atherosclerotic calcifications involving the large vessels of the skull base. No unexpected hyperdense vessel. Skull: Normal. Negative for fracture or focal lesion. Sinuses/Orbits: Sinonasal polyps versus mucous retention cysts in the right maxillary and left sphenoid sinuses. Paranasal sinuses are otherwise clear. Orbital structures unremarkable. Other: None. IMPRESSION: No acute intracranial findings.  Stable exam from 03/09/2019. Electronically Signed   By:  Duanne Guess D.O.   On: 03/13/2019 11:42   CT Head Wo Contrast  Result Date: 03/09/2019 CLINICAL DATA:  Weakness. EXAM: CT HEAD WITHOUT CONTRAST TECHNIQUE: Contiguous axial images were obtained from the base of the skull through the vertex without intravenous contrast. COMPARISON:  Brain MRI, dated November 19, 2017, is available for comparison. FINDINGS: Brain: There is mild to moderate severity cerebral atrophy with widening of the extra-axial spaces and ventricular dilatation. There are areas of decreased attenuation within the white matter tracts of the supratentorial brain, consistent with microvascular disease changes. A large area of cortical encephalomalacia, with adjacent chronic white matter low attenuation, is seen within the right occipital lobe. Similar appearing areas are seen within the left frontal lobe and posterior parietal region on the left. These areas are seen on the prior MRI. Vascular: No hyperdense vessel or unexpected calcification. Skull: Normal. Negative for fracture or focal lesion. Sinuses/Orbits: No acute finding. Other: None. IMPRESSION: 1. No acute intracranial process is seen. 2. Chronic infarcts within the right occipital lobe, left frontal lobe and posterior parietal region on the left. Electronically Signed   By: Aram Candela M.D.   On: 03/09/2019 17:25   CT ABDOMEN PELVIS W CONTRAST  Result Date: 03/09/2019 CLINICAL DATA:  Weakness and low blood sugar. EXAM: CT ABDOMEN AND PELVIS WITH CONTRAST TECHNIQUE: Multidetector CT imaging of the abdomen and pelvis was performed using the standard protocol following bolus administration of intravenous contrast. CONTRAST:  OMNIPAQUE IOHEXOL 300 MG/ML  SOLN COMPARISON:  None. FINDINGS: Lower chest: Very mild areas of atelectasis and/or infiltrate are seen within the bilateral lung bases. Small bilateral pleural effusions are seen. Hepatobiliary: No focal liver abnormality is seen. No gallstones, gallbladder wall  thickening, or biliary dilatation. Pancreas: Unremarkable. No pancreatic ductal dilatation or surrounding inflammatory changes. Spleen: Normal in size without focal abnormality. Adrenals/Urinary Tract: Adrenal glands are unremarkable. Kidneys are normal, without renal calculi, focal lesion, or hydronephrosis. Bladder is unremarkable. Stomach/Bowel: Stomach is within normal limits. Appendix appears normal. No evidence of bowel wall thickening, distention, or inflammatory changes. Vascular/Lymphatic: Mild to moderate severity aortic atherosclerosis. No enlarged abdominal or pelvic lymph nodes. Reproductive: Prostate is unremarkable. Other: There is a mild amount of perihepatic fluid. Musculoskeletal: Chronic posterolateral seventh and eighth right rib fractures are seen. Chronic posterolateral eighth, ninth and tenth left rib fractures  are also noted. Multilevel degenerative changes are seen throughout the lumbar spine. IMPRESSION: 1. Very mild areas of atelectasis and/or infiltrate within the bilateral lung bases with small bilateral pleural effusions. 2. Mild amount of perihepatic fluid. 3. Mild to moderate severity aortic atherosclerosis. 4. Chronic bilateral rib fractures. Aortic Atherosclerosis (ICD10-I70.0). Electronically Signed   By: Virgina Norfolk M.D.   On: 03/09/2019 17:30   CARDIAC CATHETERIZATION  Result Date: 03/26/2019 Moderate obstructive disease of the proximal/ostial RCA as well as a large posterolateral branch, both lesions interrogated with DFR and are shown to not be hemodynamically significant.  Recommend continued medical therapy.  CARDIAC CATHETERIZATION  Result Date: 03/22/2019  Mid LM to Prox LAD lesion is 80% stenosed with 65% stenosed side branch in Ramus. Prox LAD to Mid LAD lesion is 70% stenosed.  A drug-eluting stent was successfully placed from the LEFT MAIN into the proximal LAD using a SYNERGY XD 3.0X38. = The stent was upsized to 4.3 mm in the left main and 3.3 mm in  the LAD  Post intervention, there is a 0% residual stenosis in the LEFT MAIN and LAD.  Post PTCA intervention, the side branch was reduced to 10% residual stenosis.  Ramus-1 lesion is 70% stenosed. Post PTCA 2.0 mm balloon intervention, there is a 35% residual stenosis.  Ramus-2 lesion is 75% stenosed. This lesion was not successfully crossed. Post intervention, there is a 70% residual stenosis.  1st Diag lesion is 90% stenosed.  Dist LAD lesion is 99% stenosed. Post intervention (simply with the guidewire advanced to the distal LAD, the vessel recanalized), there is a 80% residual stenosis.  A drug-eluting stent was successfully placed using a SYNERGY XD 3.0X38.  SUMMARY  Successful atherectomy based PCI of LEFT MAIN-PROXIMAL LAD with placement of 3.0 mm x 38 mm Synergy DES stent postdilated to 4.3 mm in the LEFT MAIN and 3.3 mm in the LAD.  Balloon angioplasty only of the ostial and proximal ramus intermedius (unable to pass one 0 mm balloon beyond the lesion) RECOMMENDATIONS  Patient be transferred back to CCU.  We have applied FemoStop on the left femoral access site which appears to be well controlled.  Plan will be to maintain FemoStop at low pressure for roughly 1 hour and then begin to wean off.  Would consider evaluation and treatment of the RCA lesions at a later date, but would allow for time to recover from this procedure over the weekend.  Given concern for possible blood loss during sheath exchange, we have written for 1 unit transfusion PRBC. [name], MD  CARDIAC CATHETERIZATION  Result Date: 03/19/2019 1. Filling pressures remain mildly elevated. 2. Mild pulmonary venous hypertension.  3. Low cardiac index around 2. 4. Severe 3 vessel coronary disease.  I do not think that he is going to be a good CABG candidate due to poor functional capacity at baseline.  Discussed with Dr. Ellyn Hack, may be candidate for atherectomy/stenting left main into LAD, ramus, PLV (and ostial RCA  depending on flow wire results).  Will discuss with colleagues.  Would need Impella support.   US Venous Img Lower Bilateral (DVT)  Result Date: 03/09/2019 CLINICAL DATA:  Leg swelling x4 months. EXAM: BILATERAL LOWER EXTREMITY VENOUS DOPPLER ULTRASOUND TECHNIQUE: Gray-scale sonography with graded compression, as well as color Doppler and duplex ultrasound were performed to evaluate the lower extremity deep venous systems from the level of the common femoral vein and including the common femoral, femoral, profunda femoral, popliteal and calf veins including the  posterior tibial, peroneal and gastrocnemius veins when visible. The superficial great saphenous vein was also interrogated. Spectral Doppler was utilized to evaluate flow at rest and with distal augmentation maneuvers in the common femoral, femoral and popliteal veins. COMPARISON:  None. FINDINGS: RIGHT LOWER EXTREMITY Common Femoral Vein: No evidence of thrombus. Normal compressibility, respiratory phasicity and response to augmentation. Saphenofemoral Junction: No evidence of thrombus. Normal compressibility and flow on color Doppler imaging. Profunda Femoral Vein: No evidence of thrombus. Normal compressibility and flow on color Doppler imaging. Femoral Vein: No evidence of thrombus. Normal compressibility, respiratory phasicity and response to augmentation. Popliteal Vein: No evidence of thrombus. Normal compressibility, respiratory phasicity and response to augmentation. Calf Veins: No evidence of thrombus. Normal compressibility and flow on color Doppler imaging. Superficial Great Saphenous Vein: No evidence of thrombus. Normal compressibility. Venous Reflux:  None. Other Findings:  None. LEFT LOWER EXTREMITY Common Femoral Vein: No evidence of thrombus. Normal compressibility, respiratory phasicity and response to augmentation. Saphenofemoral Junction: No evidence of thrombus. Normal compressibility and flow on color Doppler imaging. Profunda  Femoral Vein: No evidence of thrombus. Normal compressibility and flow on color Doppler imaging. Femoral Vein: No evidence of thrombus. Normal compressibility, respiratory phasicity and response to augmentation. Popliteal Vein: No evidence of thrombus. Normal compressibility, respiratory phasicity and response to augmentation. Calf Veins: No evidence of thrombus. Normal compressibility and flow on color Doppler imaging. Superficial Great Saphenous Vein: No evidence of thrombus. Normal compressibility. Venous Reflux:  None. Other Findings:  None. IMPRESSION: No evidence of deep venous thrombosis in either lower extremity. Electronically Signed   By: Aram Candela M.D.   On: 03/09/2019 17:44   DG Chest 1V REPEAT Same Day  Result Date: 03/14/2019 CLINICAL DATA:  Left central line placement, history of diabetes, previous tobacco abuse EXAM: CHEST - 1 VIEW SAME DAY COMPARISON:  03/14/2019 FINDINGS: Frontal view of the chest demonstrates left-sided central venous catheter tip overlying brachiocephalic confluence. Cardiac silhouette is unremarkable. The bibasilar ground-glass airspace disease and small bilateral pleural effusion seen on prior chest CT are less apparent by radiograph. No acute bony abnormalities. IMPRESSION: 1. No complication after left-sided central line placement. 2. Otherwise stable exam. Electronically Signed   By: Sharlet Salina M.D.   On: 03/14/2019 15:10   DG CHEST PORT 1 VIEW  Result Date: 03/14/2019 CLINICAL DATA:  Pneumonia, negative COVID test. EXAM: PORTABLE CHEST 1 VIEW COMPARISON:  CT chest 03/13/2019 and chest radiograph 03/13/2019. FINDINGS: Trachea is midline. Heart is enlarged, as before. Mild bibasilar airspace opacification, right greater than left, as on yesterday's CT chest. Lungs are otherwise clear. Small right pleural effusion. IMPRESSION: Mild bibasilar airspace opacification, right greater than left, and small right pleural effusion, findings which may be due to mild  congestive heart failure. Pneumonia, including COVID-19 pneumonia, not excluded. Electronically Signed   By: Leanna Battles M.D.   On: 03/14/2019 09:52   DG Chest Portable 1 View  Result Date: 03/13/2019 CLINICAL DATA:  Larey Seat, right rib pain, hypertension, diabetes EXAM: PORTABLE CHEST 1 VIEW COMPARISON:  03/09/2019 FINDINGS: Single frontal view of the chest demonstrates a stable cardiac silhouette. No airspace disease, effusion, or pneumothorax. Chronic right posterior seventh rib fracture. IMPRESSION: No active disease. Electronically Signed   By: Sharlet Salina M.D.   On: 03/13/2019 10:59   DG Chest Port 1 View  Result Date: 03/09/2019 CLINICAL DATA:  Weakness and low blood sugar. EXAM: PORTABLE CHEST 1 VIEW COMPARISON:  11/20/2018 FINDINGS: Patient slightly rotated to the left. Lungs are  adequately inflated without focal airspace consolidation or effusion. Mild stable cardiomegaly. Remainder of the exam is unchanged. IMPRESSION: No acute cardiopulmonary disease. Mild stable cardiomegaly. Electronically Signed   By: Elberta Fortis M.D.   On: 03/09/2019 16:00   VAS [place] ABI WITH/WO TBI  Result Date: 03/18/2019 LOWER EXTREMITY DOPPLER STUDY High Risk         Hypertension, hyperlipidemia, Diabetes, past history of Factors:          smoking.  Comparison Study: 06/24/2016 Performing Technologist: Levin Bacon RDMS, RVT  Examination Guidelines: A complete evaluation includes at minimum, Doppler waveform signals and systolic blood pressure reading at the level of bilateral brachial, anterior tibial, and posterior tibial arteries, when vessel segments are accessible. Bilateral testing is considered an integral part of a complete examination. Photoelectric Plethysmograph (PPG) waveforms and toe systolic pressure readings are included as required and additional duplex testing as needed. Limited examinations for reoccurring indications may be performed as noted.  ABI Findings:  +---------+------------------+-----+---------+--------+ Right    Rt Pressure (mmHg)IndexWaveform Comment  +---------+------------------+-----+---------+--------+ Brachial 101                    triphasic         +---------+------------------+-----+---------+--------+ PTA      51                0.47 audible           +---------+------------------+-----+---------+--------+ DP       79                0.73 audible           +---------+------------------+-----+---------+--------+ Great Toe26                0.24                   +---------+------------------+-----+---------+--------+ +---------+------------------+-----+---------+-------+ Left     Lt Pressure (mmHg)IndexWaveform Comment +---------+------------------+-----+---------+-------+ Brachial 108                    triphasic        +---------+------------------+-----+---------+-------+ PTA      85                0.79 biphasic         +---------+------------------+-----+---------+-------+ DP       108               1.00 triphasic        +---------+------------------+-----+---------+-------+ Great Toe58                0.54 Normal           +---------+------------------+-----+---------+-------+ +-------+-----------+-----------+------------+------------+ ABI/TBIToday's ABIToday's TBIPrevious ABIPrevious TBI +-------+-----------+-----------+------------+------------+ Right  0.73                                           +-------+-----------+-----------+------------+------------+ Left   1.00                                           +-------+-----------+-----------+------------+------------+  Summary: Right: Resting right ankle-brachial index indicates moderate right lower extremity arterial disease. RT great toe pressure = 26 mmHg. Left: Resting left ankle-brachial index is within normal range. No evidence of significant left lower extremity arterial disease. LT Great toe pressure = 58  mmHg.   *See table(s) above for measurements and observations.  Electronically signed by Fabienne Bruns MD on 03/18/2019 at 11:37:23 AM.   Final    ECHOCARDIOGRAM COMPLETE  Result Date: 03/14/2019   ECHOCARDIOGRAM REPORT   Patient Name:   Matthew Mccormick Date of Exam: 03/14/2019 Medical Rec #:  [id number]      Height:       72.0 in Accession #:    [id number]     Weight:       237.9 lb Date of Birth:  [birth year]       BSA:          2.29 m Patient Age:    63 years       BP:           88/72 mmHg Patient Gender: M              HR:           110 bpm. [facility] Procedure: 2D Echo Indications:    Cardiomyopathy-Unspecified 425.9 / I42.9                 Elevated Troponin  History:        Patient has no prior history of Echocardiogram examinations.                 Stroke, Arrythmias:Atrial Fibrillation; Risk Factors:Former                 Smoker, Dyslipidemia and Diabetes. Sepsis.  Sonographer:    Jeryl Columbia RDCS (AE) Referring Phys: [id number] Ellsworth Lennox IMPRESSIONS  1. Left ventricular ejection fraction, by visual estimation, is 20 to 25%. The left ventricle has severely decreased function. There is no left ventricular hypertrophy.  2. Definity contrast agent was given IV to delineate the left ventricular endocardial borders.  3. Elevated left ventricular end-diastolic pressure.  4. Indeterminate diastolic filling due to E-A fusion.  5. The left ventricle demonstrates global hypokinesis.  6. Global right ventricle has severely reduced systolic function.The right ventricular size is mildly enlarged. No increase in right ventricular wall thickness.  7. Left atrial size was normal.  8. Right atrial size was mildly dilated.  9. Mild mitral annular calcification. 10. The mitral valve is grossly normal. Mild mitral valve regurgitation. 11. The tricuspid valve is normal in structure. 12. The tricuspid valve is normal in structure. Tricuspid valve regurgitation is mild. 13. The aortic valve is tricuspid. Aortic  valve regurgitation is not visualized. No evidence of aortic valve sclerosis or stenosis. 14. The pulmonic valve was grossly normal. Pulmonic valve regurgitation is not visualized. 15. Moderately elevated pulmonary artery systolic pressure. 16. The inferior vena cava is dilated in size with >50% respiratory variability, suggesting right atrial pressure of 8 mmHg. FINDINGS  Left Ventricle: Left ventricular ejection fraction, by visual estimation, is 20 to 25%. The left ventricle has severely decreased function. Definity contrast agent was given IV to delineate the left ventricular endocardial borders. The left ventricle demonstrates global hypokinesis. There is no left ventricular hypertrophy. Indeterminate diastolic filling due to E-A fusion. Elevated left ventricular end-diastolic pressure. Right Ventricle: The right ventricular size is mildly enlarged. No increase in right ventricular wall thickness. Global RV systolic function is has severely reduced systolic function. The tricuspid regurgitant velocity is 3.24 m/s, and with an assumed right atrial pressure of 10 mmHg, the estimated right ventricular systolic pressure is moderately elevated at 52.0 mmHg. Left Atrium: Left atrial size was normal in  size. Right Atrium: Right atrial size was mildly dilated Pericardium: There is no evidence of pericardial effusion. Mitral Valve: The mitral valve is grossly normal. Mildly decreased mobility of the mitral valve leaflets. Mild mitral annular calcification. Mild mitral valve regurgitation. Tricuspid Valve: The tricuspid valve is normal in structure. Tricuspid valve regurgitation is mild. Aortic Valve: The aortic valve is tricuspid. Aortic valve regurgitation is not visualized. The aortic valve is structurally normal, with no evidence of sclerosis or stenosis. Pulmonic Valve: The pulmonic valve was grossly normal. Pulmonic valve regurgitation is not visualized. Pulmonic regurgitation is not visualized. Aorta: The aortic  root is normal in size and structure. Venous: The inferior vena cava is dilated in size with greater than 50% respiratory variability, suggesting right atrial pressure of 8 mmHg. IAS/Shunts: No atrial level shunt detected by color flow Doppler.  LEFT VENTRICLE PLAX 2D LVIDd:         4.80 cm  Diastology LVIDs:         4.25 cm  LV e' lateral:   6.50 cm/s LV PW:         1.16 cm  LV E/e' lateral: 10.4 LV IVS:        1.06 cm  LV e' medial:    4.31 cm/s LVOT diam:     2.00 cm  LV E/e' medial:  15.8 LV SV:         27 ml LV SV Index:   11.26 LVOT Area:     3.14 cm  RIGHT VENTRICLE RV S prime:     4.62 cm/s TAPSE (M-mode): 0.8 cm LEFT ATRIUM             Index       RIGHT ATRIUM           Index LA diam:        4.90 cm 2.14 cm/m  RA Area:     19.60 cm LA Vol (A2C):   72.7 ml 31.70 ml/m RA Volume:   67.70 ml  29.52 ml/m LA Vol (A4C):   64.7 ml 28.21 ml/m LA Biplane Vol: 70.2 ml 30.61 ml/m   AORTA Ao Root diam: 2.80 cm MITRAL VALVE                        TRICUSPID VALVE MV Area (PHT): 4.21 cm             TR Peak grad:   42.0 mmHg MV PHT:        52.20 msec           TR Vmax:        324.00 cm/s MV Decel Time: 180 msec MR Peak grad: 58.1 mmHg             SHUNTS MR Mean grad: 41.0 mmHg             Systemic Diam: 2.00 cm MR Vmax:      381.00 cm/s MR Vmean:     304.0 cm/s MV E velocity: 67.90 cm/s 103 cm/s MV A velocity: 20.90 cm/s 70.3 cm/s MV E/A ratio:  3.25       1.5  Prentice Docker MD Electronically signed by Prentice Docker MD Signature Date/Time: 03/14/2019/11:15:54 AM    Final    CT Angio Chest/Abd/Pel for Dissection W and/or Wo Contrast  Result Date: 03/13/2019 CLINICAL DATA:  Abdominal pain, remote fall EXAM: CT ANGIOGRAPHY CHEST, ABDOMEN AND PELVIS TECHNIQUE: Multidetector CT imaging through the chest, abdomen and pelvis was  performed using the standard protocol during bolus administration of intravenous contrast. Multiplanar reconstructed images and MIPs were obtained and reviewed to evaluate the vascular  anatomy. CONTRAST:  OMNIPAQUE IOHEXOL 350 MG/ML SOLN COMPARISON:  None. FINDINGS: CTA CHEST FINDINGS Cardiovascular: Preferential opacification of the thoracic aorta. No evidence of thoracic aortic aneurysm or dissection. Normal heart size. No pericardial effusion. Thoracic aortic atherosclerosis. Multi vessel coronary artery atherosclerosis. Mediastinum/Nodes: No enlarged mediastinal, hilar, or axillary lymph nodes. Thyroid gland, trachea, and esophagus demonstrate no significant findings. Lungs/Pleura: Small bilateral pleural effusions, right greater than left. Right lower lobe patchy airspace disease. No pneumothorax. Musculoskeletal: No chest wall abnormality. No acute or significant osseous findings. Review of the MIP images confirms the above findings. CTA ABDOMEN AND PELVIS FINDINGS VASCULAR Aorta: Normal caliber aorta without aneurysm, dissection, vasculitis or significant stenosis. Abdominal aortic atherosclerosis. Celiac: Patent without evidence of aneurysm, dissection, vasculitis or significant stenosis. SMA: Patent without evidence of aneurysm, dissection, vasculitis or significant stenosis. Renals: Both renal arteries are patent without evidence of aneurysm, dissection, vasculitis, fibromuscular dysplasia or significant stenosis. IMA: Patent without evidence of aneurysm, dissection, vasculitis or significant stenosis. Inflow: Patent without evidence of aneurysm, dissection, vasculitis or significant stenosis. Veins: No obvious venous abnormality within the limitations of this arterial phase study. Review of the MIP images confirms the above findings. NON-VASCULAR Hepatobiliary: No focal liver abnormality is seen. No gallstones, gallbladder wall thickening, or biliary dilatation. Pancreas: Unremarkable. No pancreatic ductal dilatation or surrounding inflammatory changes. Spleen: Normal in size without focal abnormality. Adrenals/Urinary Tract: Adrenal glands are unremarkable. Kidneys are normal,  without renal calculi, focal lesion, or hydronephrosis. Bladder is unremarkable. Stomach/Bowel: Stomach is within normal limits. Appendix appears normal. No evidence of bowel wall thickening, distention, or inflammatory changes. Lymphatic: No lymphadenopathy. Reproductive: Prostate is unremarkable. Other: Small amount of abdominal and pelvic free fluid. Mild anasarca. Musculoskeletal: No acute osseous abnormality. No aggressive osseous lesion. Review of the MIP images confirms the above findings. IMPRESSION: 1. No evidence of an aortic dissection.  No aortic aneurysm. 2.  Aortic Atherosclerosis (ICD10-I70.0). 3. Right lower lobe pneumonia. 4. Small amount of pelvic free fluid. Electronically Signed   By: Elige Ko   On: 03/13/2019 11:55       Discharge Exam: Vitals:   03/29/19 2051 03/30/19 0407  BP: (!) 89/53 94/62  Pulse: 80 80  Resp: 16 16  Temp: 98.5 F (36.9 C) 98.5 F (36.9 C)  SpO2: 99% 95%    GENERAL: No acute distress.  Appears well.  HEENT: MMM.  Vision and hearing grossly intact.  NECK: Supple.  No apparent JVD.  RESP: On RA.  No IWOB.  Fair aeration bilaterally. CVS:  RRR. Heart sounds normal.  ABD/GI/GU: Bowel sounds present. Soft. Non tender.  MSK/EXT:  Moves extremities. No apparent deformity or edema.  SKIN: no apparent skin lesion or wound NEURO: Awake, alert and oriented appropriately.  No apparent focal neuro deficit. PSYCH: Calm. Normal affect.    The results of significant diagnostics from this hospitalization (including imaging, microbiology, ancillary and laboratory) are listed below for reference.     Microbiology: Recent Results (from the past 240 hour(s))  MRSA PCR Screening     Status: None   Collection Time: 03/21/19  7:03 PM   Specimen: Nasal Mucosa; Nasopharyngeal  Result Value Ref Range Status   MRSA by PCR NEGATIVE NEGATIVE Final    Comment:        The GeneXpert MRSA Assay (FDA approved for NASAL specimens only), is one  component of  a comprehensive MRSA colonization surveillance program. It is not intended to diagnose MRSA infection nor to guide or monitor treatment for MRSA infections. Performed at [facility], [address], [place], [place] [address]   SARS CORONAVIRUS 2 (TAT 6-24 HRS) Nasopharyngeal Nasopharyngeal Swab     Status: None   Collection Time: 03/29/19  2:25 AM   Specimen: Nasopharyngeal Swab  Result Value Ref Range Status   SARS Coronavirus 2 NEGATIVE NEGATIVE Final    Comment: (NOTE) SARS-CoV-2 target nucleic acids are NOT DETECTED. The SARS-CoV-2 RNA is generally detectable in upper and lower respiratory specimens during the acute phase of infection. Negative results do not preclude SARS-CoV-2 infection, do not rule out co-infections with other pathogens, and should not be used as the sole basis for treatment or other patient management decisions. Negative results must be combined with clinical observations, patient history, and epidemiological information. The expected result is Negative. Fact Sheet for Patients: HairSlick.no Fact Sheet for Healthcare Providers: quierodirigir.com This test is not yet approved or cleared by the [place] FDA and  has been authorized for detection and/or diagnosis of SARS-CoV-2 by FDA under an Emergency Use Authorization (EUA). This EUA will remain  in effect (meaning this test can be used) for the duration of the COVID-19 declaration under Section 56 4(b)(1) of the Act, 21 U.S.C. section 360bbb-3(b)(1), unless the authorization is terminated or revoked sooner. Performed at [facility], [address], [place], [place] [address]      Labs: BNP (last 3 results) Recent Labs    03/14/19 1832  BNP 1,338.6*   Basic Metabolic Panel: Recent Labs  Lab 03/26/19 0410 03/27/19 0530 03/28/19 0506 03/29/19 0557 03/30/19 0345  NA 130* 130* 134* 133* 132*  K 3.7 3.9 3.8 3.7 4.2  CL  92* 95* 99 99 98  CO2 GLUCOSE 107* 100* 111* 110* 154*  BUN CREATININE 0.70 0.66 0.63 0.62 0.84  CALCIUM 8.4* 8.1* 8.5* 8.3* 8.5*  MG  --   --   --  1.6* 1.8  PHOS  --   --   --  3.0 3.3   Liver Function Tests: Recent Labs  Lab 03/29/19 0557 03/30/19 0345  ALBUMIN 2.1* 2.5*   No results for input(s): LIPASE, AMYLASE in the last 168 hours. No results for input(s): AMMONIA in the last 168 hours. CBC: Recent Labs  Lab 03/24/19 0521 03/24/19 1810 03/26/19 0410 03/29/19 0557 03/30/19 0345  WBC 10.4  --  10.3 8.3 9.6  NEUTROABS 7.8*  --   --   --   --   HGB 12.2* 12.5* 11.9* 10.4* 11.8*  HCT 36.7* 38.6* 37.0* 33.2* 35.9*  MCV 91.3  --  94.9 96.5 94.0  PLT 196  --  266 295 334   Cardiac Enzymes: No results for input(s): CKTOTAL, CKMB, CKMBINDEX, TROPONINI in the last 168 hours. BNP: Invalid input(s): POCBNP CBG: Recent Labs  Lab 03/29/19 1226 03/29/19 1631 03/29/19 2114 03/29/19 2353 03/30/19 0405  GLUCAP 167* 145* 194* 141* 136*   D-Dimer No results for input(s): DDIMER in the last 72 hours. Hgb A1c No results for input(s): HGBA1C in the last 72 hours. Lipid Profile Recent Labs    03/28/19 0506  CHOL 97  HDL 23*  LDLCALC 62  TRIG 60  CHOLHDL 4.2   Thyroid function studies No results for input(s): TSH, T4TOTAL, T3FREE, THYROIDAB in the last 72 hours.  Invalid input(s): FREET3 Anemia work up No results for input(s): VITAMINB12, FOLATE, FERRITIN, TIBC, IRON, RETICCTPCT in the last 72 hours. Urinalysis    Component Value Date/Time   COLORURINE AMBER (A) 03/14/2019 0600   APPEARANCEUR HAZY (A) 03/14/2019 0600   LABSPEC 1.032 (H) 03/14/2019 0600   PHURINE 5.0 03/14/2019 0600   GLUCOSEU >=500 (A) 03/14/2019 0600   HGBUR NEGATIVE 03/14/2019 0600   BILIRUBINUR NEGATIVE 03/14/2019 0600   KETONESUR 5 (A) 03/14/2019 0600   PROTEINUR 30 (A) 03/14/2019 0600   NITRITE NEGATIVE 03/14/2019 0600   LEUKOCYTESUR NEGATIVE 03/14/2019  0600   Sepsis Labs Invalid input(s): PROCALCITONIN,  WBC,  LACTICIDVEN   Time coordinating discharge: 45 minutes  SIGNED:  Almon Hercules, MD  [facility] 03/30/2019, 8:12 AM  If 7PM-7AM, please contact night-coverage [email or web address] Password [email or web address]

## 2019-03-30 NOTE — Progress Notes (Signed)
I called report To Verdia Kuba at [facility]. No further questions. PTAR has AVS. Pt in stable condition to be discharged

## 2019-04-03 DIAGNOSIS — I2510 Atherosclerotic heart disease of native coronary artery without angina pectoris: Secondary | ICD-10-CM | POA: Diagnosis not present

## 2019-04-03 DIAGNOSIS — F419 Anxiety disorder, unspecified: Secondary | ICD-10-CM | POA: Diagnosis not present

## 2019-04-03 DIAGNOSIS — I509 Heart failure, unspecified: Secondary | ICD-10-CM | POA: Diagnosis not present

## 2019-04-04 ENCOUNTER — Telehealth: Payer: Self-pay | Admitting: *Deleted

## 2019-04-04 NOTE — Telephone Encounter (Signed)
Pt has appt w/ clinic Monday 04/09/19

## 2019-04-04 NOTE — Telephone Encounter (Signed)
Message left on voicemail r/e appt information.

## 2019-04-04 NOTE — Telephone Encounter (Signed)
Transportation staff at [facility] called to set up hospital f/u with Dr. Shirlee Latch.

## 2019-04-05 DIAGNOSIS — I509 Heart failure, unspecified: Secondary | ICD-10-CM | POA: Diagnosis not present

## 2019-04-05 DIAGNOSIS — I739 Peripheral vascular disease, unspecified: Secondary | ICD-10-CM | POA: Diagnosis not present

## 2019-04-05 DIAGNOSIS — I2510 Atherosclerotic heart disease of native coronary artery without angina pectoris: Secondary | ICD-10-CM | POA: Diagnosis not present

## 2019-04-05 DIAGNOSIS — F410 Panic disorder [episodic paroxysmal anxiety] without agoraphobia: Secondary | ICD-10-CM | POA: Diagnosis not present

## 2019-04-09 ENCOUNTER — Telehealth (HOSPITAL_COMMUNITY): Payer: Self-pay | Admitting: *Deleted

## 2019-04-09 ENCOUNTER — Other Ambulatory Visit: Payer: Self-pay

## 2019-04-09 ENCOUNTER — Encounter (HOSPITAL_COMMUNITY): Payer: Self-pay

## 2019-04-09 ENCOUNTER — Ambulatory Visit (HOSPITAL_COMMUNITY)
Admit: 2019-04-09 | Discharge: 2019-04-09 | Disposition: A | Discharge status: Home / Self Care | Payer: No Typology Code available for payment source | Source: Ambulatory Visit | Attending: Cardiology | Admitting: Cardiology

## 2019-04-09 VITALS — BP 112/58 | HR 91

## 2019-04-09 DIAGNOSIS — E1122 Type 2 diabetes mellitus with diabetic chronic kidney disease: Secondary | ICD-10-CM | POA: Diagnosis not present

## 2019-04-09 DIAGNOSIS — I5022 Chronic systolic (congestive) heart failure: Secondary | ICD-10-CM

## 2019-04-09 DIAGNOSIS — Z794 Long term (current) use of insulin: Secondary | ICD-10-CM | POA: Insufficient documentation

## 2019-04-09 DIAGNOSIS — Z955 Presence of coronary angioplasty implant and graft: Secondary | ICD-10-CM | POA: Insufficient documentation

## 2019-04-09 DIAGNOSIS — Z79899 Other long term (current) drug therapy: Secondary | ICD-10-CM | POA: Diagnosis not present

## 2019-04-09 DIAGNOSIS — I69354 Hemiplegia and hemiparesis following cerebral infarction affecting left non-dominant side: Secondary | ICD-10-CM | POA: Insufficient documentation

## 2019-04-09 DIAGNOSIS — Z7902 Long term (current) use of antithrombotics/antiplatelets: Secondary | ICD-10-CM | POA: Diagnosis not present

## 2019-04-09 DIAGNOSIS — E785 Hyperlipidemia, unspecified: Secondary | ICD-10-CM | POA: Insufficient documentation

## 2019-04-09 DIAGNOSIS — Z7982 Long term (current) use of aspirin: Secondary | ICD-10-CM | POA: Insufficient documentation

## 2019-04-09 DIAGNOSIS — I739 Peripheral vascular disease, unspecified: Secondary | ICD-10-CM | POA: Insufficient documentation

## 2019-04-09 DIAGNOSIS — Z9861 Coronary angioplasty status: Secondary | ICD-10-CM | POA: Diagnosis not present

## 2019-04-09 DIAGNOSIS — I255 Ischemic cardiomyopathy: Secondary | ICD-10-CM | POA: Diagnosis not present

## 2019-04-09 DIAGNOSIS — I2510 Atherosclerotic heart disease of native coronary artery without angina pectoris: Secondary | ICD-10-CM

## 2019-04-09 DIAGNOSIS — N189 Chronic kidney disease, unspecified: Secondary | ICD-10-CM | POA: Insufficient documentation

## 2019-04-09 DIAGNOSIS — I69319 Unspecified symptoms and signs involving cognitive functions following cerebral infarction: Secondary | ICD-10-CM | POA: Insufficient documentation

## 2019-04-09 DIAGNOSIS — Z87891 Personal history of nicotine dependence: Secondary | ICD-10-CM | POA: Diagnosis not present

## 2019-04-09 DIAGNOSIS — I130 Hypertensive heart and chronic kidney disease with heart failure and stage 1 through stage 4 chronic kidney disease, or unspecified chronic kidney disease: Secondary | ICD-10-CM | POA: Insufficient documentation

## 2019-04-09 LAB — COMPREHENSIVE METABOLIC PANEL
ALT: 29 U/L (ref 0–44)
AST: 30 U/L (ref 15–41)
Albumin: 3.7 g/dL (ref 3.5–5.0)
Alkaline Phosphatase: 105 U/L (ref 38–126)
Anion gap: 15 (ref 5–15)
BUN: 14 mg/dL (ref 8–23)
CO2: 26 mmol/L (ref 22–32)
Calcium: 9.8 mg/dL (ref 8.9–10.3)
Chloride: 94 mmol/L — ABNORMAL LOW (ref 98–111)
Creatinine, Ser: 0.61 mg/dL (ref 0.61–1.24)
GFR calc Af Amer: >60 mL/min (ref >60)
GFR calc non Af Amer: >60 mL/min (ref >60)
Glucose, Bld: 90 mg/dL (ref 70–99)
Potassium: 4.6 mmol/L (ref 3.5–5.1)
Sodium: 135 mmol/L (ref 135–145)
Total Bilirubin: 1.5 mg/dL — ABNORMAL HIGH (ref 0.3–1.2)
Total Protein: 8 g/dL (ref 6.5–8.1)

## 2019-04-09 LAB — DIGOXIN LEVEL: Digoxin Level: 0.7 ng/mL — ABNORMAL LOW (ref 0.8–2.0)

## 2019-04-09 MED ORDER — IVABRADINE HCL 5 MG PO TABS: 7.5000 mg | ORAL_TABLET | Freq: Two times a day (BID) | ORAL | Status: DC

## 2019-04-09 NOTE — Patient Instructions (Signed)
Labs done today  Your physician recommends that you schedule a follow-up appointment in: 3-4 weeks  Instructions on form for [facility]

## 2019-04-09 NOTE — Progress Notes (Signed)
Advanced Heart Failure Clinic Note   Referring Physician: PCP: Matthew Blitz, MD PCP-Cardiologist: Kate Sable, MD  AHF: Dr. Aundra Dubin   HPI: [name] is a 64 year old male with a history of HTN, hyperlipidemia, DMII, and CVA with left hemiparesis.  In 2018 had ECHO at [facility] with EF 30-35% and normal RV.  He recently presented to [facility] on 1/29 with weakness and fatigue. AST 110, ALT 90, WBC 11.6. CT of head negative for acute findings but did have chronic infarcts in right occipital lobe, left frontal lobe and posterior parietal region on the left. He was noted to have cool extremities with purplish color with blisters. Able to doppler pulses at that time. Dopplers negative for DVT. Discharged to home the same day on keflex for cellulitis.   He returned to [facility] on 03/13/19 after being found on the floor by his family. He did not lose consciousness but could not get off the floor for some time. Lives alone. CXR was ok. CTA concerning for RLL pneumonia.  CT head - no acute findings. Hypotensive in the ED. Lactic acid was 3.6, WBC 16, AST 1189, ALT 930, HS Trop 109>105, COVID negative. Blood Cultures obtained and started on antibiotics. GI consulted for elevated LFTs. Hepatitis panel negative. Placed on IV fluids for hypotension with no improvement. CCM consulted for septic shock. Cardiology consulted for elevated HS Trop and low EF. Not felt to be acute coronary syndrome. Echo showed low EF < 15% with severe RV dysfunction. He was subsequently transferred to [facility] for shock management and AHF consultation. Placed on IV lasix for volume overload + milrinone for low output.   Had [facility] on 2/8 showing mildly elevated filling pressures, mild pulmonary venous hypertension, low cardiac index (1.98) and severe 3V CAD. He was not felt to be a good CABG candidate due to poor functional capacity at baseline. Decision was made to proceed w/ PCI w/ Impella support.  He underwent complex PCI with atherectomy of the  left main coronary 2/11 using hemodynamic support with Impella. He returned to cath lab 2/15 for possible PCI of lesions in the ostial/proximal RCA and PLA branches, however DFR measured at 0.91. Lesions not hemodynamically significant. No indication for PCI RCA/PLB.  Post PCI of LM, he was placed on ASA and Plavix. Impella removed. From a HF standpoint, he diuresed well w/ IV Lasix and was transitioned to PO Lasix. He was ultimately weaned off milrinone and placed on GDMT (losartan, spiro, digoxin, ivabradine and Jardiance). BP too soft for Entresto. Due to weakness/deconditioning, PT recommended discharge to SNF for rehabilitation.   He presents today for post hospital f/u. Here from SNF. In wheelchair. Looks weak/frail. Looks much older than actual age. Reports that he has done fairly well since discharge. He is getting rehab. Feels that he is making progress. He denies dyspnea. No CP. I have reviewed MAR from SNF. He has been getting all of his prescribed cardiac meds. His BP has been a bit soft, in the low 166A systolic. BP today in clinic, after AM meds, 112/58. Occasionally gets dizzy w/ standing, but no syncope/ near syncope.      Review of Systems: [y] = yes, [ ]  = no   General: Weight gain [ ] ; Weight loss [ ] ; Anorexia [ ] ; Fatigue [ ] ; Fever [ ] ; Chills [ ] ; Weakness [ ]   Cardiac: Chest pain/pressure [ ] ; Resting SOB [ ] ; Exertional SOB [ ] ; Orthopnea [ ] ; Pedal Edema [ ] ; Palpitations [ ] ;  Syncope [ ] ; Presyncope [ ] ; Paroxysmal nocturnal dyspnea[ ]   Pulmonary: Cough [ ] ; Wheezing[ ] ; Hemoptysis[ ] ; Sputum [ ] ; Snoring [ ]   GI: Vomiting[ ] ; Dysphagia[ ] ; Melena[ ] ; Hematochezia [ ] ; Heartburn[ ] ; Abdominal pain [ ] ; Constipation [ ] ; Diarrhea [ ] ; BRBPR [ ]   GU: Hematuria[ ] ; Dysuria [ ] ; Nocturia[ ]   Vascular: Pain in legs with walking [ ] ; Pain in feet with lying flat [ ] ; Non-healing sores [ ] ; Stroke [ ] ; TIA [ ] ; Slurred speech [ ] ;  Neuro: Headaches[ ] ; Vertigo[ ] ; Seizures[ ] ;  Paresthesias[ ] ;Blurred vision [ ] ; Diplopia [ ] ; Vision changes [ ]   Ortho/Skin: Arthritis [ ] ; Joint pain [ ] ; Muscle pain [ ] ; Joint swelling [ ] ; Back Pain [ ] ; Rash [ ]   Psych: Depression[ ] ; Anxiety[ ]   Heme: Bleeding problems [ ] ; Clotting disorders [ ] ; Anemia [ ]   Endocrine: Diabetes [ ] ; Thyroid dysfunction[ ]    Past Medical History:  Diagnosis Date  . 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 Chronic kidney disease   . Diabetes mellitus without complication (HCC)   . Hyperlipidemia   . Hypertension   . Slow rate of speech   . Stroke [facility]    1 year ago-left and shaky and unsteady on feet.    Current Outpatient Medications  Medication Sig Dispense Refill  . aspirin EC 81 MG tablet Take 81 mg by mouth daily.    atorvastatin (LIPITOR) 40 MG tablet Take 40 mg by mouth daily.    . clopidogrel (PLAVIX) 75 MG tablet Take 75 mg by mouth daily.     . digoxin (LANOXIN) 0.125 MG tablet Take 1 tablet (0.125 mg total) by mouth daily.    . empagliflozin (JARDIANCE) 10 MG TABS tablet Take 10 mg by mouth daily.     . furosemide (LASIX) 40 MG tablet Take 1 tablet (40 mg total) by mouth 2 (two) times daily. 60 tablet   . insulin detemir (LEVEMIR) 100 UNIT/ML injection Inject 0.2 mLs (20 Units total) into the skin 2 (two) times daily. 10 mL 11  . ivabradine (CORLANOR) 5 MG TABS tablet Take 1 tablet (5 mg total) by mouth 2 (two) times daily with a meal. 60 tablet   . losartan (COZAAR) 25 MG tablet Take 0.5 tablets (12.5 mg total) by mouth daily.    . metFORMIN (GLUCOPHAGE) 1000 MG tablet Take 1,000 mg by mouth 2 (two) times daily with a meal.    . spironolactone (ALDACTONE) 25 MG tablet Take 1 tablet (25 mg total) by mouth daily.     No current facility-administered medications for this encounter.    No Known Allergies    Social History   Socioeconomic History  . Marital status: Single    Spouse name: Not on file  . Number of children: Not on file  . Years of  education: Not on file  . Highest education level: Not on file  Occupational History  . Not on file  Tobacco Use  . Smoking status: Former Smoker    Packs/day: 1.00    Years: 1.00    Pack years: 1.00    Types: Cigarettes    Quit date: 06/16/1991    Years since quitting: 27.8  . Smokeless tobacco: Never Used  Substance and Sexual Activity  . Alcohol use: No  . Drug use: No  . Sexual activity: Yes    Birth control/protection: None  Other Topics Concern  .  Not on file  Social History Narrative   Pt lives alone in 1 story home   8th grade education   Has home health aid 6days/week   Social Determinants of Health   Financial Resource Strain:   . Difficulty of Paying Living Expenses: Not on file  Food Insecurity:   . Worried About Programme researcher, broadcasting/film/video in the Last Year: Not on file  . Ran Out of Food in the Last Year: Not on file  Transportation Needs:   . Lack of Transportation (Medical): Not on file  . Lack of Transportation (Non-Medical): Not on file  Physical Activity:   . Days of Exercise per Week: Not on file  . Minutes of Exercise per Session: Not on file  Stress:   . Feeling of Stress : Not on file  Social Connections:   . Frequency of Communication with Friends and Family: Not on file  . Frequency of Social Gatherings with Friends and Family: Not on file  . Attends Religious Services: Not on file  . Active Member of Clubs or Organizations: Not on file  . Attends Banker Meetings: Not on file  . Marital Status: Not on file  Intimate Partner Violence:   . Fear of Current or Ex-Partner: Not on file  . Emotionally Abused: Not on file  . Physically Abused: Not on file  . Sexually Abused: Not on file      Family History  Problem Relation Age of Onset  . Liver disease Neg Hx     Vitals:   04/09/19 1037  BP: (!) 112/58  Pulse: 91  SpO2: 98%     PHYSICAL EXAM: General:  Frail/ weak appearing WM, looks much older than actual age. In wheel chair.  No respiratory difficulty HEENT: normal Neck: supple. no JVD. Carotids 2+ bilat; no bruits. No lymphadenopathy or thyromegaly appreciated. Cor: PMI nondisplaced. Regular rate & rhythm. No rubs, gallops or murmurs. Lungs: clear Abdomen: soft, nontender, nondistended. No hepatosplenomegaly. No bruits or masses. Good bowel sounds. Extremities: no cyanosis, clubbing, rash, edema, chronic venous stasis dermatitis  Neuro: alert & oriented x 3, cranial nerves grossly intact. moves all 4 extremities w/o difficulty. Affect pleasant.   ASSESSMENT & PLAN:  1. Chronic Systolic CHF: Prior echo in 2018 with EF 30-35%. Recent admit to [facility] 2/21 for a/c CHF w/ low output.  EF down to 10-15% with severe RV dysfunction on repeat echo. Ischemic cardiomyopathy based on cath. No ACS during admission (HS-TnI in the 100s range with no trend). RHC w/ low CI, 1.98, and required milrinone for RV support and to aid in diuresis. Weaned off milrinone.  Now on GDMT w/ little BP room for further titration. BP 112/58 today and SBPs at SNF have stayed in the low 100s. Euvolemic on exam.  - Continue Lasix 40 mg po bid.  - Continue digoxin 0.125 daily.  Check Dig level today  - Continue spironolactone to 25 mg daily.  - Continue losartan 12.5 daily. BP too soft for Entresto  - Continue ivabradine. Resting HR >70 bpm (91). Increase dose to 7.5 mg bid - Check CMP today   2. CAD: Severe 3VD on cath 2/21. Not a CABG candidate due to poor functional capacity at baseline. S/p atherectomy/stenting left main into LAD and PTCA of ramus on 03/22/19. DFR of RCA and PL is 0.91, confirming nonhemodynamically significant lesions (plan medical therapy).  - No s/s of ischemia - Continue DAPT w/ ASA/Plavix indefinitely  - Continue Crestor 20, (lipids  at goal, LDL 62).   3. H/o CVA: Residual left-sided weakness as well as some cognitive dysfunction per notes.  - He continues on Plavix.  - Continue statin.    4. PAD: Moderate RLE vascular  disease by ABIs, left normal. Denies claudication - continue medical therapy, ASA/Plavix + statin  5. Deconditioning: Baseline poor and lives alone.  Continues rehab at SNF  F/u in 2-3 weeks to reassess and try to titrate HF meds, if BP and renal function allows.    Robbie Lis, PA-C 04/09/19

## 2019-04-09 NOTE — Telephone Encounter (Signed)
Noralee Space, RN  04/09/2019 4:06 PM EST    Called [facility] facility, spoke w/Brandy, RN gave VO to increase Corlanor

## 2019-04-09 NOTE — Telephone Encounter (Signed)
-----   Message from Allayne Butcher, [place] sent at 04/09/2019  3:35 PM EST ----- Labs stable. Increase Corlanor to 7.5 mg bid given systolic HF w/ HR >70.

## 2019-04-10 DIAGNOSIS — E119 Type 2 diabetes mellitus without complications: Secondary | ICD-10-CM | POA: Diagnosis not present

## 2019-04-10 DIAGNOSIS — I4891 Unspecified atrial fibrillation: Secondary | ICD-10-CM | POA: Diagnosis not present

## 2019-04-10 DIAGNOSIS — I10 Essential (primary) hypertension: Secondary | ICD-10-CM | POA: Diagnosis not present

## 2019-04-11 DIAGNOSIS — I5023 Acute on chronic systolic (congestive) heart failure: Secondary | ICD-10-CM | POA: Diagnosis not present

## 2019-04-12 ENCOUNTER — Other Ambulatory Visit: Payer: Self-pay | Admitting: *Deleted

## 2019-04-12 NOTE — Patient Outreach (Signed)
Screened for potential [facility] Care Management needs as a benefit of  [employer] Medicare.  Matthew Mccormick is currently receiving skilled therapy at [facility].   Writer attended telephonic interdisciplinary team meeting to assess for disposition needs and transition plan for resident.   Facility reports member previously had CAPS services Monday thru Saturday. States member will need 24/7. Niece [name] is primary contact. Facility dc planner has been trying to reach niece.   Will continue to follow and plan outreach to [name] (niece) as well.  Raiford Noble, MSN-Ed, RN,BSN [facility] Post Acute Care Coordinator [phone] [facility]) [phone]  (Toll free office)

## 2019-04-16 ENCOUNTER — Other Ambulatory Visit: Payer: Self-pay | Admitting: *Deleted

## 2019-04-16 NOTE — Patient Outreach (Signed)
[facility] Post- Acute Care Coordinator follow up  Matthew Mccormick is currently receiving skilled therapy at [facility].   Telephone call made to Matthew Mccormick [phone]. No answer. Voicemail box full. Unable to leave message.   Telephone call made to [name] [phone]. Patient identifiers confirmed. Matthew Mccormick states she was at the facility looking at member thru a window/door. States he is not walking well and that he will need to be able to walk better prior to returning home. Asked if she considered member staying long term. Matthew Mccormick states staying long term would be last resort. States she does not want member to have to give up his apartment.   States she has looked into [place] [facility] in the past for member. However, she states she really doesn't want her cousin to have to live in a facility the rest of his life.  States member had caregiver assistance from 8 am to 3 pm for 6 days a week prior. The rest of the time he is home alone. Matthew Mccormick states member would not be able to go home in the state he is in now. Encouraged Matthew Mccormick to consider alternative plans or options.   Matthew Mccormick reports she is unable to care for member because she has a "weak heart."  Discussed [facility] Care Management follow up. However, it will be best to outreach at later time if member improves and returns home. For now, will send Matthew Mccormick writer's contact and [facility] Care Management information and website address.   Communicated with facility [facility] Social Worker about conversation with member's [name].   Will continue to follow while residing at [facility].    Raiford Noble, MSN-Ed, RN,BSN [facility] Post Acute Care Coordinator [phone] [facility]) [phone]  (Toll free office)

## 2019-04-19 ENCOUNTER — Other Ambulatory Visit: Payer: Self-pay | Admitting: *Deleted

## 2019-04-19 NOTE — Patient Outreach (Signed)
Screened for potential [facility] Care Management needs as a benefit of  [employer] Medicare.  Matthew Mccormick is currently receiving skilled therapy at [facility].  Writer attended telephonic interdisciplinary team meeting to assess for disposition needs and transition plan for resident.   Facility reports Matthew Mccormick is making progress. Transition plan is to return home alone with resumption of caregiver assistance. Facility SW indicates advance noticed will be needed for CAPS program to restart upon returning home.   Will continue to follow for transition plans while residing in SNF.   Will plan outreach to member's cousin again closer to SNF dc.   Will also plan to make referrals to [facility] paramedicine program and [facility] Care Management RNCM closer to SNF dc.  Matthew Noble, MSN-Ed, RN,BSN [facility] Post Acute Care Coordinator [phone] [facility]) [phone]  (Toll free office)

## 2019-04-26 ENCOUNTER — Other Ambulatory Visit: Payer: Self-pay | Admitting: *Deleted

## 2019-04-26 NOTE — Patient Outreach (Signed)
Screened for potential [facility] Care Management needs as a benefit of  [employer].  Mr. Yankowski remains at [facility] SNF receiving skilled therapy.  Writer attended telephonic interdisciplinary team meeting to assess for disposition needs and transition plan for resident.   Facility reports member is progressing with therapy. Tentative dc date is next Friday 3/25. CAPS will still need to reassess in order to restart aide services at home.   Writer will plan to contact paramedicine for referral. Will plan outreach to member's cousin [name] again.   Raiford Noble, MSN-Ed, RN,BSN [facility] Post Acute Care Coordinator [phone] [facility]) [phone]  (Toll free office)

## 2019-04-27 ENCOUNTER — Other Ambulatory Visit: Payer: Self-pay | Admitting: *Deleted

## 2019-04-27 NOTE — Patient Outreach (Signed)
[facility] Post-Acute Care Coordinator follow up  Mr. Matthew Mccormick is currently receiving skilled therapy at [facility].   Telephone call made to his cousin/ primary contact Matthew Mccormick [phone]. No answer. Left HIPAA compliant voicemail message requesting call back.   Telephone call made to [facility]). Voicemail message left requesting call back. Will try back at later time to confirm tentative dc date from [facility] next Friday 05/04/19.   Will also plan to make [facility] Care Management referral.   Will continue to follow while member resides in SNF.   Raiford Noble, MSN-Ed, RN,BSN [facility] Post Acute Care Coordinator [phone] [facility]) [phone]  (Toll free office)

## 2019-05-01 ENCOUNTER — Other Ambulatory Visit: Payer: Self-pay | Admitting: *Deleted

## 2019-05-01 ENCOUNTER — Encounter (HOSPITAL_COMMUNITY): Payer: Self-pay

## 2019-05-01 ENCOUNTER — Other Ambulatory Visit: Payer: Self-pay

## 2019-05-01 ENCOUNTER — Ambulatory Visit (HOSPITAL_COMMUNITY)
Admission: RE | Admit: 2019-05-01 | Discharge: 2019-05-01 | Disposition: A | Discharge status: Home / Self Care | Payer: No Typology Code available for payment source | Source: Ambulatory Visit | Attending: Cardiology | Admitting: Cardiology

## 2019-05-01 VITALS — BP 102/84 | HR 93 | Wt 192.0 lb

## 2019-05-01 DIAGNOSIS — I69319 Unspecified symptoms and signs involving cognitive functions following cerebral infarction: Secondary | ICD-10-CM | POA: Diagnosis not present

## 2019-05-01 DIAGNOSIS — N189 Chronic kidney disease, unspecified: Secondary | ICD-10-CM | POA: Insufficient documentation

## 2019-05-01 DIAGNOSIS — I10 Essential (primary) hypertension: Secondary | ICD-10-CM | POA: Diagnosis not present

## 2019-05-01 DIAGNOSIS — I5022 Chronic systolic (congestive) heart failure: Secondary | ICD-10-CM | POA: Insufficient documentation

## 2019-05-01 DIAGNOSIS — I130 Hypertensive heart and chronic kidney disease with heart failure and stage 1 through stage 4 chronic kidney disease, or unspecified chronic kidney disease: Secondary | ICD-10-CM | POA: Diagnosis not present

## 2019-05-01 DIAGNOSIS — R5381 Other malaise: Secondary | ICD-10-CM | POA: Diagnosis not present

## 2019-05-01 DIAGNOSIS — Z79899 Other long term (current) drug therapy: Secondary | ICD-10-CM | POA: Diagnosis not present

## 2019-05-01 DIAGNOSIS — Z9861 Coronary angioplasty status: Secondary | ICD-10-CM

## 2019-05-01 DIAGNOSIS — Z87891 Personal history of nicotine dependence: Secondary | ICD-10-CM | POA: Diagnosis not present

## 2019-05-01 DIAGNOSIS — E1122 Type 2 diabetes mellitus with diabetic chronic kidney disease: Secondary | ICD-10-CM | POA: Diagnosis not present

## 2019-05-01 DIAGNOSIS — E1151 Type 2 diabetes mellitus with diabetic peripheral angiopathy without gangrene: Secondary | ICD-10-CM | POA: Insufficient documentation

## 2019-05-01 DIAGNOSIS — I2510 Atherosclerotic heart disease of native coronary artery without angina pectoris: Secondary | ICD-10-CM | POA: Insufficient documentation

## 2019-05-01 DIAGNOSIS — Z7902 Long term (current) use of antithrombotics/antiplatelets: Secondary | ICD-10-CM | POA: Diagnosis not present

## 2019-05-01 DIAGNOSIS — I69354 Hemiplegia and hemiparesis following cerebral infarction affecting left non-dominant side: Secondary | ICD-10-CM | POA: Insufficient documentation

## 2019-05-01 DIAGNOSIS — E785 Hyperlipidemia, unspecified: Secondary | ICD-10-CM | POA: Insufficient documentation

## 2019-05-01 DIAGNOSIS — Z7982 Long term (current) use of aspirin: Secondary | ICD-10-CM | POA: Diagnosis not present

## 2019-05-01 DIAGNOSIS — E119 Type 2 diabetes mellitus without complications: Secondary | ICD-10-CM | POA: Diagnosis not present

## 2019-05-01 DIAGNOSIS — Z794 Long term (current) use of insulin: Secondary | ICD-10-CM | POA: Diagnosis not present

## 2019-05-01 DIAGNOSIS — Z955 Presence of coronary angioplasty implant and graft: Secondary | ICD-10-CM | POA: Diagnosis not present

## 2019-05-01 DIAGNOSIS — I4891 Unspecified atrial fibrillation: Secondary | ICD-10-CM | POA: Diagnosis not present

## 2019-05-01 MED ORDER — FUROSEMIDE 40 MG PO TABS: ORAL_TABLET | ORAL | 3 refills | Status: DC

## 2019-05-01 NOTE — Progress Notes (Signed)
Advanced Heart Failure Clinic Note   PCP: Kirstie Peri, MD PCP-Cardiologist: Prentice Docker, MD  AHF: Dr. Shirlee Latch   HPI: Mr Cubero is a 64 year old male with a history of HTN, hyperlipidemia, DMII, and CVA with left hemiparesis.  In 2018 had ECHO at [facility] with EF 30-35% and normal RV.  Presented to [facility] on 1/29 with weakness and fatigue. AST 110, ALT 90, WBC 11.6. CT of head negative for acute findings but did have chronic infarcts in right occipital lobe, left frontal lobe and posterior parietal region on the left. He was noted to have cool extremities with purplish color with blisters. Able to doppler pulses at that time. Dopplers negative for DVT. Discharged to home the same day on keflex for cellulitis.   He returned to [facility] on 03/13/19 after being found on the floor by his family. He did not lose consciousness but could not get off the floor for some time. Lives alone. CXR was ok. CTA concerning for RLL pneumonia.  CT head - no acute findings. Hypotensive in the ED. Lactic acid was 3.6, WBC 16, AST 1189, ALT 930, HS Trop 109>105, COVID negative. Blood Cultures obtained and started on antibiotics. GI consulted for elevated LFTs. Hepatitis panel negative. Placed on IV fluids for hypotension with no improvement. CCM consulted for septic shock. Cardiology consulted for elevated HS Trop and low EF. Not felt to be acute coronary syndrome. Echo showed low EF < 15% with severe RV dysfunction. He was subsequently transferred to [facility] for shock management and AHF consultation. Placed on IV lasix for volume overload + milrinone for low output.   Had [facility] on 2/8 showing mildly elevated filling pressures, mild pulmonary venous hypertension, low cardiac index (1.98) and severe 3V CAD. He was not felt to be a good CABG candidate due to poor functional capacity at baseline. Decision was made to proceed w/ PCI w/ Impella support.  He underwent complex PCI with atherectomy of the left main coronary 2/11 using  hemodynamic support with Impella. He returned to cath lab 2/15 for possible PCI of lesions in the ostial/proximal RCA and PLA branches, however DFR measured at 0.91. Lesions not hemodynamically significant. No indication for PCI RCA/PLB.  Post PCI of LM, he was placed on ASA and Plavix. Impella removed. From a HF standpoint, he diuresed well w/ IV Lasix and was transitioned to PO Lasix. He was ultimately weaned off milrinone and placed on GDMT (losartan, spiro, digoxin, ivabradine and Jardiance). BP too soft for Entresto. Due to weakness/deconditioning, PT recommended discharge to SNF for rehabilitation.   Today he returns for HF follow up. Last visit ivabradine was increased to 7.5 mg twice a day. Overall feeling ok. Plans to discharge from Rehab later this week. Says he is walking with a walker.  Mild SOB with exertion. Denies PND/Orthopnea. Noticed leg swelling.  Denies chest pain. Appetite ok. No fever or chills. Weight at home  pounds. Taking all medications provided by facility. His family has set up 12 hours care once he is discharged. Plans for therapy 3 times a week.      Past Medical History:  Diagnosis Date  . 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 Chronic kidney disease   . Diabetes mellitus without complication (HCC)   . Hyperlipidemia   . Hypertension   . Slow rate of speech   . Stroke [facility])    1 year ago-left and shaky and unsteady on feet.    Current Outpatient  Medications  Medication Sig Dispense Refill  . aspirin EC 81 MG tablet Take 81 mg by mouth daily.    Marland Kitchen atorvastatin (LIPITOR) 40 MG tablet Take 40 mg by mouth daily.    . clopidogrel (PLAVIX) 75 MG tablet Take 75 mg by mouth daily.     . digoxin (LANOXIN) 0.125 MG tablet Take 1 tablet (0.125 mg total) by mouth daily.    . empagliflozin (JARDIANCE) 10 MG TABS tablet Take 10 mg by mouth daily.     . furosemide (LASIX) 40 MG tablet Take 1 tablet (40 mg total) by mouth 2 (two) times daily. 60 tablet   .  insulin detemir (LEVEMIR) 100 UNIT/ML injection Inject 0.2 mLs (20 Units total) into the skin 2 (two) times daily. 10 mL 11  . ivabradine (CORLANOR) 5 MG TABS tablet Take 1.5 tablets (7.5 mg total) by mouth 2 (two) times daily with a meal. 60 tablet   . losartan (COZAAR) 25 MG tablet Take 0.5 tablets (12.5 mg total) by mouth daily.    . metFORMIN (GLUCOPHAGE) 1000 MG tablet Take 1,000 mg by mouth 2 (two) times daily with a meal.    . spironolactone (ALDACTONE) 25 MG tablet Take 1 tablet (25 mg total) by mouth daily.     No current facility-administered medications for this encounter.    No Known Allergies    Social History   Socioeconomic History  . Marital status: Single    Spouse name: Not on file  . Number of children: Not on file  . Years of education: Not on file  . Highest education level: Not on file  Occupational History  . Not on file  Tobacco Use  . Smoking status: Former Smoker    Packs/day: 1.00    Years: 1.00    Pack years: 1.00    Types: Cigarettes    Quit date: 06/16/1991    Years since quitting: 27.8  . Smokeless tobacco: Never Used  Substance and Sexual Activity  . Alcohol use: No  . Drug use: No  . Sexual activity: Yes    Birth control/protection: None  Other Topics Concern  . Not on file  Social History Narrative   Pt lives alone in 1 story home   8th grade education   Has home health aid 6days/week   Social Determinants of Health   Financial Resource Strain:   . Difficulty of Paying Living Expenses:   Food Insecurity:   . Worried About Charity fundraiser in the Last Year:   . Arboriculturist in the Last Year:   Transportation Needs:   . Film/video editor (Medical):   Marland Kitchen Lack of Transportation (Non-Medical):   Physical Activity:   . Days of Exercise per Week:   . Minutes of Exercise per Session:   Stress:   . Feeling of Stress :   Social Connections:   . Frequency of Communication with Friends and Family:   . Frequency of Social  Gatherings with Friends and Family:   . Attends Religious Services:   . Active Member of Clubs or Organizations:   . Attends Archivist Meetings:   Marland Kitchen Marital Status:   Intimate Partner Violence:   . Fear of Current or Ex-Partner:   . Emotionally Abused:   Marland Kitchen Physically Abused:   . Sexually Abused:       Family History  Problem Relation Age of Onset  . Liver disease Neg Hx     Vitals:   05/01/19  1234  BP: 102/84  Pulse: 93  SpO2: 98%  Weight: 87.1 kg (192 lb)   Wt Readings from Last 3 Encounters:  05/01/19 87.1 kg (192 lb)  03/30/19 95.7 kg (210 lb 15.7 oz)  03/09/19 95.3 kg (210 lb)     PHYSICAL EXAM: General:  Arrived in wheel chair. No resp difficulty HEENT: normal Neck: supple. JVP 9-10 . Carotids 2+ bilat; no bruits. No lymphadenopathy or thryomegaly appreciated. Cor: PMI nondisplaced. Regular rate & rhythm. No rubs, gallops or murmurs. Lungs: clear Abdomen: soft, nontender, nondistended. No hepatosplenomegaly. No bruits or masses. Good bowel sounds. Extremities: no cyanosis, clubbing, rash, R and LLE 1+ edema Neuro: alert & orientedx3, cranial nerves grossly intact. moves all 4 extremities w/o difficulty. Affect pleasant  ASSESSMENT & PLAN: 1. Chronic Systolic CHF: Prior echo in 2018 with EF 30-35%. Recent admit to [facility] 2/21 for a/c CHF w/ low output. ECHO 03/14/19  EF down to 10-15% with severe RV dysfunction on repeat echo. Ischemic cardiomyopathy based on cath. No ACS during admission (HS-TnI in the 100s range with no trend). RHC w/ low CI, 1.98, and required milrinone for RV support and to aid in diuresis. Weaned off milrinone.   - NYHA II-III. Volume status elevated. Increase morning lasix to 80 mg and continue lasix 40 mg in pm. Check BMET in 2 weeks.   - Continue digoxin 0.125 daily.   - Continue spironolactone to 25 mg daily.  - Continue losartan 12.5 daily. BP too soft for Entresto  - Continue ivabradine 7.5 mg twice a day    2. CAD: Severe 3VD  on cath 2/21. Not a CABG candidate due to poor functional capacity at baseline. S/p atherectomy/stenting left main into LAD and PTCA of ramus on 03/22/19. DFR of RCA and PL is 0.91, confirming nonhemodynamically significant lesions (plan medical therapy).  -No chest pain  - Continue DAPT w/ ASA/Plavix indefinitely  - Continue Crestor 20, (lipids at goal, LDL 62).   3. H/o CVA: Residual left-sided weakness as well as some cognitive dysfunction per notes.  - He continues on Plavix.  - Continue statin.    4. PAD: Moderate RLE vascular disease by ABIs, left normal. Denies claudication - continue medical therapy, ASA/Plavix + statin  5. Deconditioning:   Continues rehab at SNF until D/C later this. Continue HHPT at that time.   Follow up in 2-3 weeks after d/c with APP plan to check BMET at that time.  Follow up in 3 months with Dr Shirlee Latch + ECHO.   Tonye Becket, NP 05/01/19

## 2019-05-01 NOTE — Patient Instructions (Signed)
INCREASE Lasix to 80 mg in the AM and 40 mg in the PM  Your physician recommends that you schedule a follow-up appointment in: 2 weeks  in the Advanced Practitioners (PA/NP) Clinic   Your physician recommends that you schedule a follow-up appointment in: 3 months with Dr Shirlee Latch and echo  Your physician has requested that you have an echocardiogram. Echocardiography is a painless test that uses sound waves to create images of your heart. It provides your doctor with information about the size and shape of your heart and how well your heart's chambers and valves are working. This procedure takes approximately one hour. There are no restrictions for this procedure.  Do the following things EVERYDAY: 1) Weigh yourself in the morning before breakfast. Write it down and keep it in a log. 2) Take your medicines as prescribed 3) Eat low salt foods--Limit salt (sodium) to 2000 mg per day.  4) Stay as active as you can everyday 5) Limit all fluids for the day to less than 2 liters  At the Advanced Heart Failure Clinic, you and your health needs are our priority. As part of our continuing mission to provide you with exceptional heart care, we have created designated Provider Care Teams. These Care Teams include your primary Cardiologist (physician) and Advanced Practice Providers (APPs- Physician Assistants and Nurse Practitioners) who all work together to provide you with the care you need, when you need it.   You may see any of the following providers on your designated Care Team at your next follow up: Marland Kitchen Dr Arvilla Meres . Dr Marca Ancona . Tonye Becket, NP . Robbie Lis, PA . Karle Plumber, PharmD   Please be sure to bring in all your medications bottles to every appointment.

## 2019-05-01 NOTE — Patient Outreach (Signed)
[facility] Post-Acute Care Coordinator follow up  Telephone call made to [facility] cousin [name] [phone]. No answer. HIPAA compliant voicemail message left requesting return call.   Telephone call made to Mr. Mcpheeters [phone]. No answer. Voicemail box full. Unable to leave message.   Attempting to reach to confirm transition plans and to discuss [facility] Paramedicine referral.  Mr. Wilbourne to likely dc from [facility] SNF this week.   Will continue follow and plan outreach again.  Raiford Noble, MSN-Ed, RN,BSN [facility] Post Acute Care Coordinator [phone] [facility]) [phone]  (Toll free office)

## 2019-05-03 ENCOUNTER — Other Ambulatory Visit: Payer: Self-pay | Admitting: *Deleted

## 2019-05-03 NOTE — Patient Outreach (Signed)
Screened for potential [facility] Care Management needs as a benefit of  [employer] Medicare.  Mr. Moss is currently receiving skilled therapy at [facility].  Writer attended telephonic interdisciplinary team meeting.   Facility SW reports member will return home with caregiver assistance and home health with [facility] for PT/OT services. Member will have caregiver services Monday thru Saturdays and for 4 hrs on Sundays. Susy Manor remains primary support person. Mr. Denz to dc home on tomorrow 05/04/19.   Will plan to make [facility] Care Management referral.  Raiford Noble, MSN-Ed, RN,BSN [facility] Post Acute Care Coordinator [phone] [facility]) [phone]  (Toll free office)

## 2019-05-04 ENCOUNTER — Other Ambulatory Visit: Payer: Self-pay | Admitting: *Deleted

## 2019-05-04 DIAGNOSIS — I10 Essential (primary) hypertension: Secondary | ICD-10-CM

## 2019-05-04 NOTE — Patient Outreach (Signed)
[facility] Post-Acute Care Coordinator follow up.  Matthew Mccormick has been receiving skilled therapy at [facility].   Matthew Mccormick is slated for dc today 05/03/19 to home with [facility] home health for PT/OT and CAPS caregivers. His [name] is his primary support.  Matthew Mccormick has a history of CHF, A. Fib, HTN, DM, CAD, PAD.   Will make referral for [facility] RNCM for complex care management.   Matthew Noble, MSN-Ed, RN,BSN [facility] Post Acute Care Coordinator [phone] [facility]) [phone]  (Toll free office)

## 2019-05-08 ENCOUNTER — Other Ambulatory Visit: Payer: Self-pay | Admitting: *Deleted

## 2019-05-08 ENCOUNTER — Encounter: Payer: Self-pay | Admitting: *Deleted

## 2019-05-08 DIAGNOSIS — F419 Anxiety disorder, unspecified: Secondary | ICD-10-CM | POA: Diagnosis not present

## 2019-05-08 DIAGNOSIS — I5023 Acute on chronic systolic (congestive) heart failure: Secondary | ICD-10-CM | POA: Diagnosis not present

## 2019-05-08 DIAGNOSIS — I2510 Atherosclerotic heart disease of native coronary artery without angina pectoris: Secondary | ICD-10-CM | POA: Diagnosis not present

## 2019-05-08 DIAGNOSIS — Z8673 Personal history of transient ischemic attack (TIA), and cerebral infarction without residual deficits: Secondary | ICD-10-CM | POA: Diagnosis not present

## 2019-05-08 DIAGNOSIS — I110 Hypertensive heart disease with heart failure: Secondary | ICD-10-CM | POA: Diagnosis not present

## 2019-05-08 DIAGNOSIS — Z794 Long term (current) use of insulin: Secondary | ICD-10-CM | POA: Diagnosis not present

## 2019-05-08 DIAGNOSIS — E871 Hypo-osmolality and hyponatremia: Secondary | ICD-10-CM | POA: Diagnosis not present

## 2019-05-08 DIAGNOSIS — E1165 Type 2 diabetes mellitus with hyperglycemia: Secondary | ICD-10-CM | POA: Diagnosis not present

## 2019-05-08 DIAGNOSIS — E785 Hyperlipidemia, unspecified: Secondary | ICD-10-CM | POA: Diagnosis not present

## 2019-05-08 DIAGNOSIS — Z955 Presence of coronary angioplasty implant and graft: Secondary | ICD-10-CM | POA: Diagnosis not present

## 2019-05-08 DIAGNOSIS — E1151 Type 2 diabetes mellitus with diabetic peripheral angiopathy without gangrene: Secondary | ICD-10-CM | POA: Diagnosis not present

## 2019-05-08 DIAGNOSIS — I4891 Unspecified atrial fibrillation: Secondary | ICD-10-CM | POA: Diagnosis not present

## 2019-05-08 DIAGNOSIS — G9341 Metabolic encephalopathy: Secondary | ICD-10-CM | POA: Diagnosis not present

## 2019-05-08 NOTE — Patient Outreach (Signed)
Telephone outreach to discuss [facility] Care Management.  Spoke with Matthew Mccormick today. He has recently been hospitalized for cardiogenic shock due to acute on chronic CHF and subsequent arthrectomy, and angioplasties. He then had a subsequent SNF stay. He came home 05/03/19.  He has home health services from [facility] and a PCS aid 7 days a week for 8 hours a day. His cousin is his next of kin and she lives 20 minutes away.   [name] reports he needs a new emergency alert. Someone prepares his medications for him. Caregiver checks his glucose bid. He does not weigh at this time.  Also called and talked with pt's cousin, Matthew Mccormick, advised of my role and desire to see [name] and she if this is possible on Monday, April 5th. She is in agreement with this.  Called [facility] to advise of my role and involvement and to request collaboration on pt case. Left message with [place], home health intake nurse.  Enrolling pt in Multiple Co-morbidities program (HF, CAD, AFIB, CVA, DM, PVD).  I will visit [name] on Monday, 05/14/19 at 1:00 am.  Noralyn Pick C. Burgess Estelle, MSN, [facility] Gerontological Nurse Practitioner [facility] Care Management [phone]

## 2019-05-09 DIAGNOSIS — I5022 Chronic systolic (congestive) heart failure: Secondary | ICD-10-CM | POA: Diagnosis not present

## 2019-05-09 DIAGNOSIS — Z299 Encounter for prophylactic measures, unspecified: Secondary | ICD-10-CM | POA: Diagnosis not present

## 2019-05-09 DIAGNOSIS — I10 Essential (primary) hypertension: Secondary | ICD-10-CM | POA: Diagnosis not present

## 2019-05-09 DIAGNOSIS — E7800 Pure hypercholesterolemia, unspecified: Secondary | ICD-10-CM | POA: Diagnosis not present

## 2019-05-09 DIAGNOSIS — I2510 Atherosclerotic heart disease of native coronary artery without angina pectoris: Secondary | ICD-10-CM | POA: Diagnosis not present

## 2019-05-09 DIAGNOSIS — E1165 Type 2 diabetes mellitus with hyperglycemia: Secondary | ICD-10-CM | POA: Diagnosis not present

## 2019-05-14 ENCOUNTER — Encounter: Payer: Self-pay | Admitting: *Deleted

## 2019-05-14 ENCOUNTER — Other Ambulatory Visit: Payer: Self-pay | Admitting: *Deleted

## 2019-05-14 NOTE — Patient Outreach (Signed)
[facility]) Care Management  05/14/2019  Matthew Mccormick 1955-12-31 [id number]  Initial home visit:  Matthew Mccormick, cousin,  and Matthew Mccormick, [facility] caregiver,  present for this visit.  Has HCPOA, [place].  Patient was recently discharged from hospital and all medications have been reviewed.  Outpatient Encounter Medications as of 05/14/2019  Medication Sig  . aspirin EC 81 MG tablet Take 81 mg by mouth daily.  Marland Kitchen atorvastatin (LIPITOR) 40 MG tablet Take 40 mg by mouth daily.  . clopidogrel (PLAVIX) 75 MG tablet Take 75 mg by mouth daily.   . digoxin (LANOXIN) 0.125 MG tablet Take 1 tablet (0.125 mg total) by mouth daily.  . empagliflozin (JARDIANCE) 10 MG TABS tablet Take 10 mg by mouth daily.   . furosemide (LASIX) 40 MG tablet Take 2 tablets (80 mg total) by mouth in the morning AND 1 tablet (40 mg total) every evening.  . ivabradine (CORLANOR) 5 MG TABS tablet Take 1.5 tablets (7.5 mg total) by mouth 2 (two) times daily with a meal.  . losartan (COZAAR) 25 MG tablet Take 0.5 tablets (12.5 mg total) by mouth daily.  . metFORMIN (GLUCOPHAGE) 1000 MG tablet Take 1,000 mg by mouth 2 (two) times daily with a meal.  . spironolactone (ALDACTONE) 25 MG tablet Take 1 tablet (25 mg total) by mouth daily.  . insulin detemir (LEVEMIR) 100 UNIT/ML injection Inject 0.2 mLs (20 Units total) into the skin 2 (two) times daily.   No facility-administered encounter medications on file as of 05/14/2019.   VS:  95% Pulse Ox, 93 PULSE, BP: 120/64  Fall Risk  05/14/2019 11/07/2017 09/06/2016  Falls in the past year? 1 No No  Number falls in past yr: 0 - -  Injury with Fall? 0 - -  Risk for fall due to : History of fall(s);Medication side effect - -  Follow up Falls evaluation completed;Education provided;Falls prevention discussed - -   Depression screen [facility] 2/9 05/14/2019 09/06/2016  Decreased Interest 0 0  Down, Depressed, Hopeless 0 0  PHQ - 2 Score 0 0   THN CM Care Plan Problem One    Most Recent Value  Care Plan Problem One  Developmental delay requiring cousin and caregiver to provide daily oversight for 8 hours.  Role Documenting the Problem One  Care Management [place] for Problem One  Active  THN Long Term Goal   Cousin or caregiver will alert NP or MD for any medical problem for early intervention over the next 60 days.  THN Long Term Goal Start Date  05/15/19  Interventions for Problem One Long Term Goal  Described role of NP and need to work with Matthew Mccormick and Matthew Mccormick to identify medical problems and reach out to MD or NP ASAP.    THN CM Care Plan Problem Two     Most Recent Value  Care Plan Problem Two  Moderate risk for falls and need to increased activity for strengthening and general health/well-being.  Role Documenting the Problem Two  Care Management Coordinator  Care Plan for Problem Two  Active  Interventions for Problem Two Long Term Goal   Discussed fall safety, noted pt has lanyard emergency alert.  THN Long Term Goal  Pt will not require ED visit for a fall injury over the next 60 days.  THN Long Term Goal Start Date  05/15/19  THN CM Short Term Goal #1   Pt will participate with PT to goals set over the next  30 days.  THN CM Short Term Goal #1 Start Date  05/15/19  Interventions for Short Term Goal #2   Emphasized importance of exercise and muscle strenthening to stay independent and avoid falls.    THN CM Care Plan Problem Three     Most Recent Value  Care Plan Problem Three  HF (not following HF Action Plan)  Role Documenting the Problem Three  Care Management Coordinator  Care Plan for Problem Three  Active  THN CM Short Term Goal #1   Cousin and caregiver will learn what foods to avoid and purchase to reduce Na intake and reduce pt edema/HF exacerbation.  THN CM Short Term Goal #1 Start Date  05/15/19  Interventions for Short Term Goal #1  Discussed reason why avoiding Na is so important to manage HF.  THN CM Short Term Goal #2    Caregiver will assist pt to weigh daily and record, notifying NPor MD of 5# wt gain.  THN CM Short Term Goal #2 Start Date  05/15/19  Interventions for Short Term Goal #2  Educated that weighing daily is a basic component of managing HF.     I will call in 2 weeks to follow up on the care plan.  Matthew Mccormick. Matthew Estelle, MSN, [facility] Gerontological Nurse Practitioner [facility] Care Management [phone]          Matthew Mccormick - RCATS - helps with pt equip [phone] [email or web address]

## 2019-05-15 ENCOUNTER — Other Ambulatory Visit: Payer: Self-pay

## 2019-05-15 ENCOUNTER — Ambulatory Visit (HOSPITAL_COMMUNITY)
Admission: RE | Admit: 2019-05-15 | Discharge: 2019-05-15 | Disposition: A | Discharge status: Home / Self Care | Payer: Medicare Other | Source: Ambulatory Visit | Attending: Adult Health | Admitting: Adult Health

## 2019-05-15 ENCOUNTER — Telehealth (HOSPITAL_COMMUNITY): Payer: Self-pay | Admitting: Pharmacist

## 2019-05-15 ENCOUNTER — Encounter (HOSPITAL_COMMUNITY): Payer: Self-pay

## 2019-05-15 VITALS — BP 130/98 | HR 89 | Wt 200.8 lb

## 2019-05-15 DIAGNOSIS — I69354 Hemiplegia and hemiparesis following cerebral infarction affecting left non-dominant side: Secondary | ICD-10-CM | POA: Diagnosis not present

## 2019-05-15 DIAGNOSIS — Z87891 Personal history of nicotine dependence: Secondary | ICD-10-CM | POA: Diagnosis not present

## 2019-05-15 DIAGNOSIS — I2510 Atherosclerotic heart disease of native coronary artery without angina pectoris: Secondary | ICD-10-CM | POA: Insufficient documentation

## 2019-05-15 DIAGNOSIS — I69319 Unspecified symptoms and signs involving cognitive functions following cerebral infarction: Secondary | ICD-10-CM | POA: Diagnosis not present

## 2019-05-15 DIAGNOSIS — I255 Ischemic cardiomyopathy: Secondary | ICD-10-CM | POA: Diagnosis not present

## 2019-05-15 DIAGNOSIS — Z7982 Long term (current) use of aspirin: Secondary | ICD-10-CM | POA: Diagnosis not present

## 2019-05-15 DIAGNOSIS — Z955 Presence of coronary angioplasty implant and graft: Secondary | ICD-10-CM | POA: Diagnosis not present

## 2019-05-15 DIAGNOSIS — I5022 Chronic systolic (congestive) heart failure: Secondary | ICD-10-CM | POA: Diagnosis not present

## 2019-05-15 DIAGNOSIS — E1122 Type 2 diabetes mellitus with diabetic chronic kidney disease: Secondary | ICD-10-CM | POA: Diagnosis not present

## 2019-05-15 DIAGNOSIS — Z9861 Coronary angioplasty status: Secondary | ICD-10-CM | POA: Diagnosis not present

## 2019-05-15 DIAGNOSIS — Z7902 Long term (current) use of antithrombotics/antiplatelets: Secondary | ICD-10-CM | POA: Insufficient documentation

## 2019-05-15 DIAGNOSIS — I739 Peripheral vascular disease, unspecified: Secondary | ICD-10-CM | POA: Diagnosis not present

## 2019-05-15 DIAGNOSIS — I130 Hypertensive heart and chronic kidney disease with heart failure and stage 1 through stage 4 chronic kidney disease, or unspecified chronic kidney disease: Secondary | ICD-10-CM | POA: Insufficient documentation

## 2019-05-15 DIAGNOSIS — Z79899 Other long term (current) drug therapy: Secondary | ICD-10-CM | POA: Diagnosis not present

## 2019-05-15 DIAGNOSIS — E785 Hyperlipidemia, unspecified: Secondary | ICD-10-CM | POA: Diagnosis not present

## 2019-05-15 DIAGNOSIS — Z794 Long term (current) use of insulin: Secondary | ICD-10-CM | POA: Diagnosis not present

## 2019-05-15 DIAGNOSIS — R5381 Other malaise: Secondary | ICD-10-CM

## 2019-05-15 DIAGNOSIS — N189 Chronic kidney disease, unspecified: Secondary | ICD-10-CM | POA: Diagnosis not present

## 2019-05-15 LAB — BASIC METABOLIC PANEL
Anion gap: 12 (ref 5–15)
BUN: 11 mg/dL (ref 8–23)
CO2: 28 mmol/L (ref 22–32)
Calcium: 9 mg/dL (ref 8.9–10.3)
Chloride: 96 mmol/L — ABNORMAL LOW (ref 98–111)
Creatinine, Ser: 0.61 mg/dL (ref 0.61–1.24)
GFR calc Af Amer: >60 mL/min (ref >60)
GFR calc non Af Amer: >60 mL/min (ref >60)
Glucose, Bld: 165 mg/dL — ABNORMAL HIGH (ref 70–99)
Potassium: 4 mmol/L (ref 3.5–5.1)
Sodium: 136 mmol/L (ref 135–145)

## 2019-05-15 MED ORDER — ENTRESTO 24-26 MG PO TABS: 1.0000 | ORAL_TABLET | Freq: Two times a day (BID) | ORAL | 3 refills | Status: DC

## 2019-05-15 MED ORDER — IVABRADINE HCL 7.5 MG PO TABS: 7.5000 mg | ORAL_TABLET | Freq: Two times a day (BID) | ORAL | 3 refills | Status: AC

## 2019-05-15 MED ORDER — DIGOXIN 125 MCG PO TABS: 0.1250 mg | ORAL_TABLET | Freq: Every day | ORAL | 3 refills | Status: AC

## 2019-05-15 NOTE — Patient Instructions (Signed)
Stop Losartan  Start Entresto 24/26 mg Twice daily   Refills for Entresto, Digoxin and Corlanor have all been sent into [facility] done today, we will contact you for abnormal results  Labs needed again in 1 week, [facility] will do this  If you have any questions or concerns before your next appointment please send [place] a message through [place] or call our office at [phone].  At the Advanced Heart Failure Clinic, you and your health needs are our priority. As part of our continuing mission to provide you with exceptional heart care, we have created designated Provider Care Teams. These Care Teams include your primary Cardiologist (physician) and Advanced Practice Providers (APPs- Physician Assistants and Nurse Practitioners) who all work together to provide you with the care you need, when you need it.   You may see any of the following providers on your designated Care Team at your next follow up: Marland Kitchen Dr Arvilla Meres . Dr Marca Ancona . Tonye Becket, NP . Robbie Lis, PA . Karle Plumber, PharmD   Please be sure to bring in all your medications bottles to every appointment.

## 2019-05-15 NOTE — Telephone Encounter (Signed)
Advanced Heart Failure Patient Advocate Encounter  Prior Authorization for Corlanor has been approved.    PA# [id number] Effective dates: 02/09/19 through 02/08/20  Karle Plumber, PharmD, BCPS, BCCP, CPP Heart Failure Clinic Pharmacist [phone]

## 2019-05-15 NOTE — Progress Notes (Signed)
Advanced Heart Failure Clinic Note   PCP: Kirstie Peri, MD PCP-Cardiologist: Prentice Docker, MD  AHF: Dr. Shirlee Latch   HPI: Mr Fox is a 64 year old male with a history of HTN, hyperlipidemia, DMII, and CVA with left hemiparesis.  In 2018 had ECHO at [facility] with EF 30-35% and normal RV.  Presented to [facility] on 1/29 with weakness and fatigue. AST 110, ALT 90, WBC 11.6. CT of head negative for acute findings but did have chronic infarcts in right occipital lobe, left frontal lobe and posterior parietal region on the left. He was noted to have cool extremities with purplish color with blisters. Able to doppler pulses at that time. Dopplers negative for DVT. Discharged to home the same day on keflex for cellulitis.   He returned to [facility] on 03/13/19 after being found on the floor by his family. He did not lose consciousness but could not get off the floor for some time. Lives alone. CXR was ok. CTA concerning for RLL pneumonia.  CT head - no acute findings. Hypotensive in the ED. Lactic acid was 3.6, WBC 16, AST 1189, ALT 930, HS Trop 109>105, COVID negative. Blood Cultures obtained and started on antibiotics. GI consulted for elevated LFTs. Hepatitis panel negative. Placed on IV fluids for hypotension with no improvement. CCM consulted for septic shock. Cardiology consulted for elevated HS Trop and low EF. Not felt to be acute coronary syndrome. Echo showed low EF < 15% with severe RV dysfunction. He was subsequently transferred to [facility] for shock management and AHF consultation. Placed on IV lasix for volume overload + milrinone for low output.   Had [facility] on 2/8 showing mildly elevated filling pressures, mild pulmonary venous hypertension, low cardiac index (1.98) and severe 3V CAD. He was not felt to be a good CABG candidate due to poor functional capacity at baseline. Decision was made to proceed w/ PCI w/ Impella support.  He underwent complex PCI with atherectomy of the left main coronary 2/11 using  hemodynamic support with Impella. He returned to cath lab 2/15 for possible PCI of lesions in the ostial/proximal RCA and PLA branches, however DFR measured at 0.91. Lesions not hemodynamically significant. No indication for PCI RCA/PLB.  Post PCI of LM, he was placed on ASA and Plavix. Impella removed. From a HF standpoint, he diuresed well w/ IV Lasix and was transitioned to PO Lasix. He was ultimately weaned off milrinone and placed on GDMT (losartan, spiro, digoxin, ivabradine and Jardiance). BP too soft for Entresto. Due to weakness/deconditioning, PT recommended discharge to SNF for rehabilitation.   Today he returns for HF follow up. Last visit lasix was increased to 80 mg /40 mg . He was discharged from the SNF a week ago.  Overall feeling fine. Denies SOB/PND/Orthopnea. Able to walk more at home. Appetite ok. No fever or chills. Weight at home 200-201  pounds. Taking all medications but has not been taking ivabradine. Taking all medications provided by facility. Plans for therapy 3 times a week.  He has in home help 8 hours per day. He is living at home alone with help.   Followed by [facility] for RN/PT.     Past Medical History:  Diagnosis Date  . 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 Chronic kidney disease   . Diabetes mellitus without complication (HCC)   . Hyperlipidemia   . Hypertension   . Slow rate of speech   . Stroke [facility])    1 year ago-left  and shaky and unsteady on feet.    Current Outpatient Medications  Medication Sig Dispense Refill  . aspirin EC 81 MG tablet Take 81 mg by mouth daily.    Marland Kitchen atorvastatin (LIPITOR) 40 MG tablet Take 40 mg by mouth daily.    . clopidogrel (PLAVIX) 75 MG tablet Take 75 mg by mouth daily.     . digoxin (LANOXIN) 0.125 MG tablet Take 1 tablet (0.125 mg total) by mouth daily.    . empagliflozin (JARDIANCE) 10 MG TABS tablet Take 10 mg by mouth daily.     . furosemide (LASIX) 40 MG tablet Take 2 tablets (80 mg total) by mouth in the  morning AND 1 tablet (40 mg total) every evening. 90 tablet 3  . insulin detemir (LEVEMIR) 100 UNIT/ML injection Inject 0.2 mLs (20 Units total) into the skin 2 (two) times daily. 10 mL 11  . losartan (COZAAR) 25 MG tablet Take 0.5 tablets (12.5 mg total) by mouth daily.    . metFORMIN (GLUCOPHAGE) 1000 MG tablet Take 1,000 mg by mouth 2 (two) times daily with a meal.    . spironolactone (ALDACTONE) 25 MG tablet Take 1 tablet (25 mg total) by mouth daily.    . ivabradine (CORLANOR) 5 MG TABS tablet Take 1.5 tablets (7.5 mg total) by mouth 2 (two) times daily with a meal. (Patient not taking: Reported on 05/15/2019) 60 tablet    No current facility-administered medications for this encounter.    No Known Allergies    Social History   Socioeconomic History  . Marital status: Single    Spouse name: Not on file  . Number of children: Not on file  . Years of education: 9th grade  . Highest education level: Not on file  Occupational History  . Not on file  Tobacco Use  . Smoking status: Former Smoker    Packs/day: 1.00    Years: 1.00    Pack years: 1.00    Types: Cigarettes    Quit date: 06/16/1991    Years since quitting: 27.9  . Smokeless tobacco: Never Used  Substance and Sexual Activity  . Alcohol use: No  . Drug use: No  . Sexual activity: Yes    Birth control/protection: None  Other Topics Concern  . Not on file  Social History Narrative   Pt lives alone in 1 story home   8th grade education   Has home health aid 6days/week   Social Determinants of Health   Financial Resource Strain: Medium Risk  . Difficulty of Paying Living Expenses: Somewhat hard  Food Insecurity: No Food Insecurity  . Worried About Programme researcher, broadcasting/film/video in the Last Year: Never true  . Ran Out of Food in the Last Year: Never true  Transportation Needs: No Transportation Needs  . Lack of Transportation (Medical): No  . Lack of Transportation (Non-Medical): No  Physical Activity: Insufficiently Active   . Days of Exercise per Week: 3 days  . Minutes of Exercise per Session: 10 min  Stress: No Stress Concern Present  . Feeling of Stress : Not at all  Social Connections: Slightly Isolated  . Frequency of Communication with Friends and Family: More than three times a week  . Frequency of Social Gatherings with Friends and Family: More than three times a week  . Attends Religious Services: 1 to 4 times per year  . Active Member of Clubs or Organizations: Yes  . Attends Banker Meetings: 1 to 4 times per year  .  Marital Status: Never married  Intimate Partner Violence: Not At Risk  . Fear of Current or Ex-Partner: No  . Emotionally Abused: No  . Physically Abused: No  . Sexually Abused: No      Family History  Problem Relation Age of Onset  . Liver disease Neg Hx     Vitals:   05/15/19 1018  BP: (!) 130/98  Pulse: 89  SpO2: 98%  Weight: 91.1 kg (200 lb 12.8 oz)   Wt Readings from Last 3 Encounters:  05/15/19 91.1 kg (200 lb 12.8 oz)  05/01/19 87.1 kg (192 lb)  03/30/19 95.7 kg (210 lb 15.7 oz)     PHYSICAL EXAM: General:  Walked in the clinic a cane. No resp difficulty HEENT: normal Neck: supple. JVP 8-9.  Carotids 2+ bilat; no bruits. No lymphadenopathy or thryomegaly appreciated. Cor: PMI nondisplaced. Regular rate & rhythm. No rubs, gallops or murmurs. Lungs: clear Abdomen: soft, nontender, nondistended. No hepatosplenomegaly. No bruits or masses. Good bowel sounds. Extremities: no cyanosis, clubbing, rash, RLE /LLE trace-1+edema Neuro: alert & orientedx3, cranial nerves grossly intact. moves all 4 extremities w/o difficulty. Affect pleasant  ASSESSMENT & PLAN: 1. Chronic Systolic CHF: Prior echo in 2018 with EF 30-35%. Recent admit to [facility] 2/21 for a/c CHF w/ low output. ECHO 03/14/19  EF down to 10-15% with severe RV dysfunction on repeat echo. Ischemic cardiomyopathy based on cath. No ACS during admission (HS-TnI in the 100s range with no trend). RHC  w/ low CI, 1.98, and required milrinone for RV support and to aid in diuresis. Weaned off milrinone.   - NYHA II.  .- Continue lasix to 80 mg and continue lasix 40 mg in pm.    - Continue digoxin 0.125 daily.   - Continue spironolactone to 25 mg daily.  - - Stop losartan and start entresto 24-26 mg twice a day.  - Continue ivabradine 7.5 mg twice a day   - Next visit coreg will be considered.  - Ask HH to check BMET in 7 days.   2. CAD: Severe 3VD on cath 2/21. Not a CABG candidate due to poor functional capacity at baseline. S/p atherectomy/stenting left main into LAD and PTCA of ramus on 03/22/19. DFR of RCA and PL is 0.91, confirming nonhemodynamically significant lesions (plan medical therapy).  -No chest pain.  - Continue DAPT w/ ASA/Plavix indefinitely  - Continue Crestor 20, (lipids at goal, LDL 62).   3. H/o CVA: Residual left-sided weakness as well as some cognitive dysfunction per notes.  - He continues on Plavix.  - Continue statin.    4. PAD: Moderate RLE vascular disease by ABIs, left normal. Denies claudication - continue medical therapy, ASA/Plavix + statin  5. Deconditioning:Continue HHPT.    Follow up 4 weeks with APP and 12 weeks with Dr Haroldine Laws. Greater than 50% of the (total minutes 25) visit spent in counseling/coordination of care regarding the above.    Darrick Grinder, NP 05/15/19

## 2019-05-16 DIAGNOSIS — I4891 Unspecified atrial fibrillation: Secondary | ICD-10-CM | POA: Diagnosis not present

## 2019-05-16 DIAGNOSIS — I5023 Acute on chronic systolic (congestive) heart failure: Secondary | ICD-10-CM | POA: Diagnosis not present

## 2019-05-16 DIAGNOSIS — F419 Anxiety disorder, unspecified: Secondary | ICD-10-CM | POA: Diagnosis not present

## 2019-05-16 DIAGNOSIS — I2510 Atherosclerotic heart disease of native coronary artery without angina pectoris: Secondary | ICD-10-CM | POA: Diagnosis not present

## 2019-05-16 DIAGNOSIS — E1165 Type 2 diabetes mellitus with hyperglycemia: Secondary | ICD-10-CM | POA: Diagnosis not present

## 2019-05-16 DIAGNOSIS — I110 Hypertensive heart disease with heart failure: Secondary | ICD-10-CM | POA: Diagnosis not present

## 2019-05-21 DIAGNOSIS — B351 Tinea unguium: Secondary | ICD-10-CM | POA: Diagnosis not present

## 2019-05-21 DIAGNOSIS — M79674 Pain in right toe(s): Secondary | ICD-10-CM | POA: Diagnosis not present

## 2019-05-21 DIAGNOSIS — E1151 Type 2 diabetes mellitus with diabetic peripheral angiopathy without gangrene: Secondary | ICD-10-CM | POA: Diagnosis not present

## 2019-05-21 DIAGNOSIS — M79675 Pain in left toe(s): Secondary | ICD-10-CM | POA: Diagnosis not present

## 2019-05-24 DIAGNOSIS — I4891 Unspecified atrial fibrillation: Secondary | ICD-10-CM | POA: Diagnosis not present

## 2019-05-24 DIAGNOSIS — I2510 Atherosclerotic heart disease of native coronary artery without angina pectoris: Secondary | ICD-10-CM | POA: Diagnosis not present

## 2019-05-24 DIAGNOSIS — I5023 Acute on chronic systolic (congestive) heart failure: Secondary | ICD-10-CM | POA: Diagnosis not present

## 2019-05-24 DIAGNOSIS — F419 Anxiety disorder, unspecified: Secondary | ICD-10-CM | POA: Diagnosis not present

## 2019-05-24 DIAGNOSIS — I110 Hypertensive heart disease with heart failure: Secondary | ICD-10-CM | POA: Diagnosis not present

## 2019-05-24 DIAGNOSIS — E1165 Type 2 diabetes mellitus with hyperglycemia: Secondary | ICD-10-CM | POA: Diagnosis not present

## 2019-05-25 DIAGNOSIS — I110 Hypertensive heart disease with heart failure: Secondary | ICD-10-CM | POA: Diagnosis not present

## 2019-05-25 DIAGNOSIS — I2510 Atherosclerotic heart disease of native coronary artery without angina pectoris: Secondary | ICD-10-CM | POA: Diagnosis not present

## 2019-05-25 DIAGNOSIS — E1165 Type 2 diabetes mellitus with hyperglycemia: Secondary | ICD-10-CM | POA: Diagnosis not present

## 2019-05-25 DIAGNOSIS — I5023 Acute on chronic systolic (congestive) heart failure: Secondary | ICD-10-CM | POA: Diagnosis not present

## 2019-05-29 ENCOUNTER — Other Ambulatory Visit: Payer: Self-pay | Admitting: *Deleted

## 2019-05-29 NOTE — Patient Outreach (Signed)
[facility]) Care Management  05/29/2019  MEHMET SCALLY 06-03-1955 [id number]   Telephone outreach:  First call went unanswered and no return call.  Called again today 05/31/19 and talked with Dinah, pt's cousin and HCPOA. She reports Braylee is doing well. He just had a doctor's visit. His hgb A1C was 6.8. His wt is stable at 205 +/- 1-2#. He has not had any falls. She denies any other problems and we decided to follow up again in another month.  Zara Council. Burgess Estelle, MSN, [facility] Gerontological Nurse Practitioner [facility] Care Management [phone]

## 2019-05-31 DIAGNOSIS — I5022 Chronic systolic (congestive) heart failure: Secondary | ICD-10-CM | POA: Diagnosis not present

## 2019-05-31 DIAGNOSIS — I5023 Acute on chronic systolic (congestive) heart failure: Secondary | ICD-10-CM | POA: Diagnosis not present

## 2019-05-31 DIAGNOSIS — I739 Peripheral vascular disease, unspecified: Secondary | ICD-10-CM | POA: Diagnosis not present

## 2019-05-31 DIAGNOSIS — I2510 Atherosclerotic heart disease of native coronary artery without angina pectoris: Secondary | ICD-10-CM | POA: Diagnosis not present

## 2019-05-31 DIAGNOSIS — Z299 Encounter for prophylactic measures, unspecified: Secondary | ICD-10-CM | POA: Diagnosis not present

## 2019-05-31 DIAGNOSIS — I110 Hypertensive heart disease with heart failure: Secondary | ICD-10-CM | POA: Diagnosis not present

## 2019-05-31 DIAGNOSIS — F329 Major depressive disorder, single episode, unspecified: Secondary | ICD-10-CM | POA: Diagnosis not present

## 2019-05-31 DIAGNOSIS — E1165 Type 2 diabetes mellitus with hyperglycemia: Secondary | ICD-10-CM | POA: Diagnosis not present

## 2019-05-31 DIAGNOSIS — I10 Essential (primary) hypertension: Secondary | ICD-10-CM | POA: Diagnosis not present

## 2019-06-01 DIAGNOSIS — E1165 Type 2 diabetes mellitus with hyperglycemia: Secondary | ICD-10-CM | POA: Diagnosis not present

## 2019-06-01 DIAGNOSIS — I5023 Acute on chronic systolic (congestive) heart failure: Secondary | ICD-10-CM | POA: Diagnosis not present

## 2019-06-01 DIAGNOSIS — F419 Anxiety disorder, unspecified: Secondary | ICD-10-CM | POA: Diagnosis not present

## 2019-06-01 DIAGNOSIS — I110 Hypertensive heart disease with heart failure: Secondary | ICD-10-CM | POA: Diagnosis not present

## 2019-06-01 DIAGNOSIS — I2510 Atherosclerotic heart disease of native coronary artery without angina pectoris: Secondary | ICD-10-CM | POA: Diagnosis not present

## 2019-06-01 DIAGNOSIS — I4891 Unspecified atrial fibrillation: Secondary | ICD-10-CM | POA: Diagnosis not present

## 2019-06-07 DIAGNOSIS — E1151 Type 2 diabetes mellitus with diabetic peripheral angiopathy without gangrene: Secondary | ICD-10-CM | POA: Diagnosis not present

## 2019-06-07 DIAGNOSIS — Z8673 Personal history of transient ischemic attack (TIA), and cerebral infarction without residual deficits: Secondary | ICD-10-CM | POA: Diagnosis not present

## 2019-06-07 DIAGNOSIS — I4891 Unspecified atrial fibrillation: Secondary | ICD-10-CM | POA: Diagnosis not present

## 2019-06-07 DIAGNOSIS — Z955 Presence of coronary angioplasty implant and graft: Secondary | ICD-10-CM | POA: Diagnosis not present

## 2019-06-07 DIAGNOSIS — I5023 Acute on chronic systolic (congestive) heart failure: Secondary | ICD-10-CM | POA: Diagnosis not present

## 2019-06-07 DIAGNOSIS — I2510 Atherosclerotic heart disease of native coronary artery without angina pectoris: Secondary | ICD-10-CM | POA: Diagnosis not present

## 2019-06-07 DIAGNOSIS — Z794 Long term (current) use of insulin: Secondary | ICD-10-CM | POA: Diagnosis not present

## 2019-06-07 DIAGNOSIS — E785 Hyperlipidemia, unspecified: Secondary | ICD-10-CM | POA: Diagnosis not present

## 2019-06-07 DIAGNOSIS — G9341 Metabolic encephalopathy: Secondary | ICD-10-CM | POA: Diagnosis not present

## 2019-06-07 DIAGNOSIS — E1165 Type 2 diabetes mellitus with hyperglycemia: Secondary | ICD-10-CM | POA: Diagnosis not present

## 2019-06-07 DIAGNOSIS — I110 Hypertensive heart disease with heart failure: Secondary | ICD-10-CM | POA: Diagnosis not present

## 2019-06-07 DIAGNOSIS — E871 Hypo-osmolality and hyponatremia: Secondary | ICD-10-CM | POA: Diagnosis not present

## 2019-06-07 DIAGNOSIS — F419 Anxiety disorder, unspecified: Secondary | ICD-10-CM | POA: Diagnosis not present

## 2019-06-14 DIAGNOSIS — F419 Anxiety disorder, unspecified: Secondary | ICD-10-CM | POA: Diagnosis not present

## 2019-06-14 DIAGNOSIS — I110 Hypertensive heart disease with heart failure: Secondary | ICD-10-CM | POA: Diagnosis not present

## 2019-06-14 DIAGNOSIS — I5023 Acute on chronic systolic (congestive) heart failure: Secondary | ICD-10-CM | POA: Diagnosis not present

## 2019-06-14 DIAGNOSIS — I2510 Atherosclerotic heart disease of native coronary artery without angina pectoris: Secondary | ICD-10-CM | POA: Diagnosis not present

## 2019-06-14 DIAGNOSIS — I4891 Unspecified atrial fibrillation: Secondary | ICD-10-CM | POA: Diagnosis not present

## 2019-06-14 DIAGNOSIS — E1165 Type 2 diabetes mellitus with hyperglycemia: Secondary | ICD-10-CM | POA: Diagnosis not present

## 2019-06-22 DIAGNOSIS — I10 Essential (primary) hypertension: Secondary | ICD-10-CM | POA: Diagnosis not present

## 2019-06-22 DIAGNOSIS — L089 Local infection of the skin and subcutaneous tissue, unspecified: Secondary | ICD-10-CM | POA: Diagnosis not present

## 2019-06-22 DIAGNOSIS — I5022 Chronic systolic (congestive) heart failure: Secondary | ICD-10-CM | POA: Diagnosis not present

## 2019-06-22 DIAGNOSIS — L723 Sebaceous cyst: Secondary | ICD-10-CM | POA: Diagnosis not present

## 2019-06-22 DIAGNOSIS — Z299 Encounter for prophylactic measures, unspecified: Secondary | ICD-10-CM | POA: Diagnosis not present

## 2019-06-22 DIAGNOSIS — E1165 Type 2 diabetes mellitus with hyperglycemia: Secondary | ICD-10-CM | POA: Diagnosis not present

## 2019-06-23 DIAGNOSIS — W19XXXA Unspecified fall, initial encounter: Secondary | ICD-10-CM | POA: Diagnosis not present

## 2019-06-23 DIAGNOSIS — R69 Illness, unspecified: Secondary | ICD-10-CM | POA: Diagnosis not present

## 2019-06-23 DIAGNOSIS — R5381 Other malaise: Secondary | ICD-10-CM | POA: Diagnosis not present

## 2019-06-27 DIAGNOSIS — Z6829 Body mass index (BMI) 29.0-29.9, adult: Secondary | ICD-10-CM | POA: Diagnosis not present

## 2019-06-27 DIAGNOSIS — Z87891 Personal history of nicotine dependence: Secondary | ICD-10-CM | POA: Diagnosis not present

## 2019-06-27 DIAGNOSIS — L723 Sebaceous cyst: Secondary | ICD-10-CM | POA: Diagnosis not present

## 2019-06-27 DIAGNOSIS — I10 Essential (primary) hypertension: Secondary | ICD-10-CM | POA: Diagnosis not present

## 2019-06-27 DIAGNOSIS — Z299 Encounter for prophylactic measures, unspecified: Secondary | ICD-10-CM | POA: Diagnosis not present

## 2019-06-27 DIAGNOSIS — L089 Local infection of the skin and subcutaneous tissue, unspecified: Secondary | ICD-10-CM | POA: Diagnosis not present

## 2019-06-28 DIAGNOSIS — I5023 Acute on chronic systolic (congestive) heart failure: Secondary | ICD-10-CM | POA: Diagnosis not present

## 2019-06-28 DIAGNOSIS — F419 Anxiety disorder, unspecified: Secondary | ICD-10-CM | POA: Diagnosis not present

## 2019-06-28 DIAGNOSIS — I4891 Unspecified atrial fibrillation: Secondary | ICD-10-CM | POA: Diagnosis not present

## 2019-06-28 DIAGNOSIS — I2510 Atherosclerotic heart disease of native coronary artery without angina pectoris: Secondary | ICD-10-CM | POA: Diagnosis not present

## 2019-06-28 DIAGNOSIS — I110 Hypertensive heart disease with heart failure: Secondary | ICD-10-CM | POA: Diagnosis not present

## 2019-06-28 DIAGNOSIS — E1165 Type 2 diabetes mellitus with hyperglycemia: Secondary | ICD-10-CM | POA: Diagnosis not present

## 2019-07-02 ENCOUNTER — Other Ambulatory Visit: Payer: Self-pay | Admitting: *Deleted

## 2019-07-02 DIAGNOSIS — L723 Sebaceous cyst: Secondary | ICD-10-CM | POA: Diagnosis not present

## 2019-07-02 DIAGNOSIS — L089 Local infection of the skin and subcutaneous tissue, unspecified: Secondary | ICD-10-CM | POA: Diagnosis not present

## 2019-07-02 DIAGNOSIS — Z01818 Encounter for other preprocedural examination: Secondary | ICD-10-CM | POA: Diagnosis not present

## 2019-07-02 NOTE — Patient Outreach (Signed)
[facility]) Care Management  07/02/2019  MANASES ETCHISON [birth year] [id number]  Telephone outreach:  Spoke with pt's cousin, Ebbie Latus. She is not able to talk right now. I will call again tomorrow.   Current weight:  Weighing daily, no added salt and have improved grocery list for less na.  Falls?  Exercise regimen:    Noralyn Pick C. Burgess Estelle, MSN, [facility] Gerontological Nurse Practitioner [facility] Care Management [phone]

## 2019-07-03 ENCOUNTER — Other Ambulatory Visit: Payer: Self-pay | Admitting: *Deleted

## 2019-07-03 DIAGNOSIS — G8194 Hemiplegia, unspecified affecting left nondominant side: Secondary | ICD-10-CM | POA: Diagnosis not present

## 2019-07-03 DIAGNOSIS — Z794 Long term (current) use of insulin: Secondary | ICD-10-CM | POA: Diagnosis not present

## 2019-07-03 DIAGNOSIS — L723 Sebaceous cyst: Secondary | ICD-10-CM | POA: Diagnosis not present

## 2019-07-03 DIAGNOSIS — Z79899 Other long term (current) drug therapy: Secondary | ICD-10-CM | POA: Diagnosis not present

## 2019-07-03 DIAGNOSIS — Z7982 Long term (current) use of aspirin: Secondary | ICD-10-CM | POA: Diagnosis not present

## 2019-07-03 DIAGNOSIS — L720 Epidermal cyst: Secondary | ICD-10-CM | POA: Diagnosis not present

## 2019-07-03 DIAGNOSIS — I10 Essential (primary) hypertension: Secondary | ICD-10-CM | POA: Diagnosis not present

## 2019-07-03 DIAGNOSIS — Z7902 Long term (current) use of antithrombotics/antiplatelets: Secondary | ICD-10-CM | POA: Diagnosis not present

## 2019-07-03 DIAGNOSIS — L02414 Cutaneous abscess of left upper limb: Secondary | ICD-10-CM | POA: Diagnosis not present

## 2019-07-03 DIAGNOSIS — E1151 Type 2 diabetes mellitus with diabetic peripheral angiopathy without gangrene: Secondary | ICD-10-CM | POA: Diagnosis not present

## 2019-07-03 DIAGNOSIS — I255 Ischemic cardiomyopathy: Secondary | ICD-10-CM | POA: Diagnosis not present

## 2019-07-06 DIAGNOSIS — I5023 Acute on chronic systolic (congestive) heart failure: Secondary | ICD-10-CM | POA: Diagnosis not present

## 2019-07-06 DIAGNOSIS — F419 Anxiety disorder, unspecified: Secondary | ICD-10-CM | POA: Diagnosis not present

## 2019-07-06 DIAGNOSIS — E1165 Type 2 diabetes mellitus with hyperglycemia: Secondary | ICD-10-CM | POA: Diagnosis not present

## 2019-07-06 DIAGNOSIS — I110 Hypertensive heart disease with heart failure: Secondary | ICD-10-CM | POA: Diagnosis not present

## 2019-07-06 DIAGNOSIS — I2510 Atherosclerotic heart disease of native coronary artery without angina pectoris: Secondary | ICD-10-CM | POA: Diagnosis not present

## 2019-07-06 DIAGNOSIS — I4891 Unspecified atrial fibrillation: Secondary | ICD-10-CM | POA: Diagnosis not present

## 2019-07-08 DIAGNOSIS — I10 Essential (primary) hypertension: Secondary | ICD-10-CM | POA: Diagnosis not present

## 2019-07-08 DIAGNOSIS — Z8673 Personal history of transient ischemic attack (TIA), and cerebral infarction without residual deficits: Secondary | ICD-10-CM | POA: Diagnosis not present

## 2019-07-08 DIAGNOSIS — I509 Heart failure, unspecified: Secondary | ICD-10-CM | POA: Diagnosis not present

## 2019-07-08 DIAGNOSIS — S0191XA Laceration without foreign body of unspecified part of head, initial encounter: Secondary | ICD-10-CM | POA: Diagnosis not present

## 2019-07-08 DIAGNOSIS — Z7901 Long term (current) use of anticoagulants: Secondary | ICD-10-CM | POA: Diagnosis not present

## 2019-07-08 DIAGNOSIS — R0602 Shortness of breath: Secondary | ICD-10-CM | POA: Diagnosis not present

## 2019-07-08 DIAGNOSIS — Z23 Encounter for immunization: Secondary | ICD-10-CM | POA: Diagnosis not present

## 2019-07-08 DIAGNOSIS — S50312A Abrasion of left elbow, initial encounter: Secondary | ICD-10-CM | POA: Diagnosis not present

## 2019-07-08 DIAGNOSIS — S0101XA Laceration without foreign body of scalp, initial encounter: Secondary | ICD-10-CM | POA: Diagnosis not present

## 2019-07-08 DIAGNOSIS — E119 Type 2 diabetes mellitus without complications: Secondary | ICD-10-CM | POA: Diagnosis not present

## 2019-07-08 DIAGNOSIS — I639 Cerebral infarction, unspecified: Secondary | ICD-10-CM | POA: Diagnosis not present

## 2019-07-08 DIAGNOSIS — I110 Hypertensive heart disease with heart failure: Secondary | ICD-10-CM | POA: Diagnosis not present

## 2019-07-12 ENCOUNTER — Telehealth (HOSPITAL_COMMUNITY): Payer: Self-pay | Admitting: *Deleted

## 2019-07-12 ENCOUNTER — Other Ambulatory Visit: Payer: Self-pay | Admitting: *Deleted

## 2019-07-12 NOTE — Patient Outreach (Signed)
[facility]) Care Management  07/12/2019  Matthew Mccormick 1955/05/10 [id number]  Telephone outreach:  Talked with cousin, HCPOA.   [name] has had several acute issues in the last month. He had an infected sebaceous cyst removed that is requiring wound care and he had a fall sustaining a scalp laceration.  [name] reports he has had some low glucose readings in the 60's (wonder if hypoglycemia contributed to his fall). His appetite has not been as robust. He had one episode of SOB which was after walking and having a long weekend away from home which resolved quickly.  She reports she is going out of town for 2 weeks, will return and then be gone for another 2 weeks. [name] CAP worker will be continuing her same schedule 6 days a week. They do not have a plan B if the caregiver is not able to be there.  We discussed the possibility of simplifying his diabetes regimen, asking Dr. Sherryll Burger if he would consider stopping his po meds and just taking his Levimir once a day. Since [name] is not going to be around she would prefer that if that is possible to delay the change until she gets back from her traveling.  Advised caregiver must keep pt walking and exercising or his PT will have been for nothing. Walk daily in the neighborhood.  Advised cousin she should definitey have a plan B before she travels in case the primary caregiver can't be there.  Continue daily wts. Call MD or NP if wt goes up, pt has continuous SOB and has LE edema. Continue efforts to minimize Na intake.  Will call wound center to see if home health wound care orders are in place.  Matthew Mccormick. Matthew Estelle, MSN, [facility] Gerontological Nurse Practitioner [facility] Care Management [phone]

## 2019-07-12 NOTE — Telephone Encounter (Signed)
Dinah left VM requesting return call about pts overall health. I called her back at [phone]. No answer/left vm requesting return call.

## 2019-07-12 NOTE — Patient Outreach (Signed)
[facility]) Care Management  07/12/2019  Matthew Mccormick [birth year] [id number]   Late entry for outreach on 07/03/19. Called, no answer, requested return call.  Zara Council. Burgess Estelle, MSN, [facility] Gerontological Nurse Practitioner [facility] Care Management [phone]

## 2019-07-13 ENCOUNTER — Other Ambulatory Visit: Payer: Self-pay

## 2019-07-13 ENCOUNTER — Inpatient Hospital Stay (HOSPITAL_COMMUNITY)
Admission: EM | Admit: 2019-07-13 | Discharge: 2019-07-18 | DRG: 292 | Disposition: A | Discharge status: Home Health | Payer: Medicare Other | Attending: Cardiology | Admitting: Cardiology

## 2019-07-13 ENCOUNTER — Encounter (HOSPITAL_COMMUNITY): Payer: Self-pay | Admitting: *Deleted

## 2019-07-13 ENCOUNTER — Emergency Department (HOSPITAL_COMMUNITY): Payer: Medicare Other

## 2019-07-13 ENCOUNTER — Telehealth (HOSPITAL_COMMUNITY): Payer: Self-pay | Admitting: Cardiology

## 2019-07-13 DIAGNOSIS — E669 Obesity, unspecified: Secondary | ICD-10-CM | POA: Diagnosis present

## 2019-07-13 DIAGNOSIS — I739 Peripheral vascular disease, unspecified: Secondary | ICD-10-CM | POA: Diagnosis present

## 2019-07-13 DIAGNOSIS — I5023 Acute on chronic systolic (congestive) heart failure: Secondary | ICD-10-CM | POA: Diagnosis present

## 2019-07-13 DIAGNOSIS — I255 Ischemic cardiomyopathy: Secondary | ICD-10-CM | POA: Diagnosis present

## 2019-07-13 DIAGNOSIS — I2510 Atherosclerotic heart disease of native coronary artery without angina pectoris: Secondary | ICD-10-CM | POA: Diagnosis present

## 2019-07-13 DIAGNOSIS — E785 Hyperlipidemia, unspecified: Secondary | ICD-10-CM | POA: Diagnosis present

## 2019-07-13 DIAGNOSIS — I472 Ventricular tachycardia: Secondary | ICD-10-CM | POA: Diagnosis not present

## 2019-07-13 DIAGNOSIS — R0902 Hypoxemia: Secondary | ICD-10-CM | POA: Diagnosis not present

## 2019-07-13 DIAGNOSIS — I509 Heart failure, unspecified: Secondary | ICD-10-CM

## 2019-07-13 DIAGNOSIS — Z6827 Body mass index (BMI) 27.0-27.9, adult: Secondary | ICD-10-CM

## 2019-07-13 DIAGNOSIS — I248 Other forms of acute ischemic heart disease: Secondary | ICD-10-CM | POA: Diagnosis present

## 2019-07-13 DIAGNOSIS — I69319 Unspecified symptoms and signs involving cognitive functions following cerebral infarction: Secondary | ICD-10-CM | POA: Diagnosis not present

## 2019-07-13 DIAGNOSIS — I959 Hypotension, unspecified: Secondary | ICD-10-CM | POA: Diagnosis not present

## 2019-07-13 DIAGNOSIS — Z7982 Long term (current) use of aspirin: Secondary | ICD-10-CM | POA: Diagnosis not present

## 2019-07-13 DIAGNOSIS — Z79899 Other long term (current) drug therapy: Secondary | ICD-10-CM | POA: Diagnosis not present

## 2019-07-13 DIAGNOSIS — Z20822 Contact with and (suspected) exposure to covid-19: Secondary | ICD-10-CM | POA: Diagnosis present

## 2019-07-13 DIAGNOSIS — I110 Hypertensive heart disease with heart failure: Secondary | ICD-10-CM | POA: Diagnosis present

## 2019-07-13 DIAGNOSIS — I69354 Hemiplegia and hemiparesis following cerebral infarction affecting left non-dominant side: Secondary | ICD-10-CM | POA: Diagnosis not present

## 2019-07-13 DIAGNOSIS — I5031 Acute diastolic (congestive) heart failure: Secondary | ICD-10-CM | POA: Diagnosis not present

## 2019-07-13 DIAGNOSIS — R627 Adult failure to thrive: Secondary | ICD-10-CM | POA: Diagnosis present

## 2019-07-13 DIAGNOSIS — Z794 Long term (current) use of insulin: Secondary | ICD-10-CM | POA: Diagnosis not present

## 2019-07-13 DIAGNOSIS — R0602 Shortness of breath: Secondary | ICD-10-CM | POA: Diagnosis not present

## 2019-07-13 DIAGNOSIS — Z87891 Personal history of nicotine dependence: Secondary | ICD-10-CM

## 2019-07-13 DIAGNOSIS — Z7902 Long term (current) use of antithrombotics/antiplatelets: Secondary | ICD-10-CM

## 2019-07-13 DIAGNOSIS — Z955 Presence of coronary angioplasty implant and graft: Secondary | ICD-10-CM | POA: Diagnosis not present

## 2019-07-13 HISTORY — DX: Chronic systolic (congestive) heart failure: I50.22

## 2019-07-13 LAB — CBC
HCT: 41.2 % (ref 39.0–52.0)
HCT: 38.2 % — ABNORMAL LOW (ref 39.0–52.0)
Hemoglobin: 13.1 g/dL (ref 13.0–17.0)
Hemoglobin: 12.6 g/dL — ABNORMAL LOW (ref 13.0–17.0)
MCH: 31 pg (ref 26.0–34.0)
MCH: 30.8 pg (ref 26.0–34.0)
MCHC: 31.8 g/dL (ref 30.0–36.0)
MCHC: 33 g/dL (ref 30.0–36.0)
MCV: 97.4 fL (ref 80.0–100.0)
MCV: 93.4 fL (ref 80.0–100.0)
Platelets: 334 10*3/uL (ref 150–400)
Platelets: 317 10*3/uL (ref 150–400)
RBC: 4.23 MIL/uL (ref 4.22–5.81)
RBC: 4.09 MIL/uL — ABNORMAL LOW (ref 4.22–5.81)
RDW: 15.2 % (ref 11.5–15.5)
RDW: 15 % (ref 11.5–15.5)
WBC: 11.7 10*3/uL — ABNORMAL HIGH (ref 4.0–10.5)
WBC: 9.9 10*3/uL (ref 4.0–10.5)
nRBC: 0 % (ref 0.0–0.2)
nRBC: 0 % (ref 0.0–0.2)

## 2019-07-13 LAB — BASIC METABOLIC PANEL
Anion gap: 16 — ABNORMAL HIGH (ref 5–15)
BUN: 15 mg/dL (ref 8–23)
CO2: 20 mmol/L — ABNORMAL LOW (ref 22–32)
Calcium: 9 mg/dL (ref 8.9–10.3)
Chloride: 94 mmol/L — ABNORMAL LOW (ref 98–111)
Creatinine, Ser: 0.99 mg/dL (ref 0.61–1.24)
GFR calc Af Amer: >60 mL/min (ref >60)
GFR calc non Af Amer: >60 mL/min (ref >60)
Glucose, Bld: 165 mg/dL — ABNORMAL HIGH (ref 70–99)
Potassium: 4.2 mmol/L (ref 3.5–5.1)
Sodium: 130 mmol/L — ABNORMAL LOW (ref 135–145)

## 2019-07-13 LAB — HEPATIC FUNCTION PANEL
ALT: 20 U/L (ref 0–44)
AST: 42 U/L — ABNORMAL HIGH (ref 15–41)
Albumin: 3.4 g/dL — ABNORMAL LOW (ref 3.5–5.0)
Alkaline Phosphatase: 82 U/L (ref 38–126)
Bilirubin, Direct: 0.7 mg/dL — ABNORMAL HIGH (ref 0.0–0.2)
Indirect Bilirubin: 1.1 mg/dL — ABNORMAL HIGH (ref 0.3–0.9)
Total Bilirubin: 1.8 mg/dL — ABNORMAL HIGH (ref 0.3–1.2)
Total Protein: 6.7 g/dL (ref 6.5–8.1)

## 2019-07-13 LAB — CREATININE, SERUM
Creatinine, Ser: 0.88 mg/dL (ref 0.61–1.24)
GFR calc Af Amer: >60 mL/min (ref >60)
GFR calc non Af Amer: >60 mL/min (ref >60)

## 2019-07-13 LAB — TROPONIN I (HIGH SENSITIVITY)
Troponin I (High Sensitivity): 172 ng/L (ref <18)
Troponin I (High Sensitivity): 200 ng/L (ref <18)
Troponin I (High Sensitivity): 309 ng/L (ref <18)
Troponin I (High Sensitivity): 289 ng/L (ref <18)

## 2019-07-13 LAB — SARS CORONAVIRUS 2 BY RT PCR (HOSPITAL ORDER, PERFORMED IN ~~LOC~~ HOSPITAL LAB): SARS Coronavirus 2: NEGATIVE

## 2019-07-13 LAB — GLUCOSE, CAPILLARY: Glucose-Capillary: 133 mg/dL — ABNORMAL HIGH (ref 70–99)

## 2019-07-13 LAB — HEMOGLOBIN A1C
Hgb A1c MFr Bld: 6.7 % — ABNORMAL HIGH (ref 4.8–5.6)
Mean Plasma Glucose: 145.59 mg/dL

## 2019-07-13 LAB — BRAIN NATRIURETIC PEPTIDE: B Natriuretic Peptide: 1733.6 pg/mL — ABNORMAL HIGH (ref 0.0–100.0)

## 2019-07-13 LAB — TSH: TSH: 1.374 u[IU]/mL (ref 0.350–4.500)

## 2019-07-13 MED ORDER — ALPRAZOLAM 0.25 MG PO TABS: 0.2500 mg | ORAL_TABLET | Freq: Two times a day (BID) | ORAL | Status: DC | PRN

## 2019-07-13 MED ORDER — INSULIN DETEMIR 100 UNIT/ML ~~LOC~~ SOLN
10.0000 [IU] | Freq: Two times a day (BID) | SUBCUTANEOUS | Status: DC
  Administered 2019-07-13 – 2019-07-18 (×10): 10 [IU] via SUBCUTANEOUS
  Filled 2019-07-13 (×12): qty 0.1

## 2019-07-13 MED ORDER — LOSARTAN POTASSIUM 25 MG PO TABS
25.0000 mg | ORAL_TABLET | Freq: Every day | ORAL | Status: DC
  Administered 2019-07-13 – 2019-07-14 (×2): 25 mg via ORAL
  Filled 2019-07-13 (×2): qty 1

## 2019-07-13 MED ORDER — SODIUM CHLORIDE 0.9% FLUSH: 3.0000 mL | INTRAVENOUS | Status: DC | PRN

## 2019-07-13 MED ORDER — ENOXAPARIN SODIUM 40 MG/0.4ML ~~LOC~~ SOLN
40.0000 mg | SUBCUTANEOUS | Status: DC
  Administered 2019-07-13 – 2019-07-17 (×5): 40 mg via SUBCUTANEOUS
  Filled 2019-07-13 (×5): qty 0.4

## 2019-07-13 MED ORDER — ZOLPIDEM TARTRATE 5 MG PO TABS: 5.0000 mg | ORAL_TABLET | Freq: Every evening | ORAL | Status: DC | PRN

## 2019-07-13 MED ORDER — SODIUM CHLORIDE 0.9% FLUSH
3.0000 mL | Freq: Two times a day (BID) | INTRAVENOUS | Status: DC
  Administered 2019-07-13 – 2019-07-18 (×9): 3 mL via INTRAVENOUS

## 2019-07-13 MED ORDER — ATORVASTATIN CALCIUM 40 MG PO TABS
40.0000 mg | ORAL_TABLET | Freq: Every day | ORAL | Status: DC
  Administered 2019-07-13 – 2019-07-18 (×6): 40 mg via ORAL
  Filled 2019-07-13 (×6): qty 1

## 2019-07-13 MED ORDER — ASPIRIN 325 MG PO TABS
325.0000 mg | ORAL_TABLET | Freq: Every day | ORAL | Status: DC
  Administered 2019-07-13: 325 mg via ORAL
  Filled 2019-07-13: qty 1

## 2019-07-13 MED ORDER — ACETAMINOPHEN 325 MG PO TABS
650.0000 mg | ORAL_TABLET | ORAL | Status: DC | PRN
  Administered 2019-07-14: 650 mg via ORAL
  Filled 2019-07-13: qty 2

## 2019-07-13 MED ORDER — IVABRADINE HCL 7.5 MG PO TABS
7.5000 mg | ORAL_TABLET | Freq: Two times a day (BID) | ORAL | Status: DC
  Administered 2019-07-13 – 2019-07-18 (×10): 7.5 mg via ORAL
  Filled 2019-07-13 (×11): qty 1

## 2019-07-13 MED ORDER — ONDANSETRON HCL 4 MG/2ML IJ SOLN: 4.0000 mg | Freq: Four times a day (QID) | INTRAMUSCULAR | Status: DC | PRN

## 2019-07-13 MED ORDER — FUROSEMIDE 10 MG/ML IJ SOLN
60.0000 mg | Freq: Once | INTRAMUSCULAR | Status: AC
  Administered 2019-07-13: 60 mg via INTRAVENOUS
  Filled 2019-07-13: qty 6

## 2019-07-13 MED ORDER — SODIUM CHLORIDE 0.9 % IV BOLUS
250.0000 mL | Freq: Once | INTRAVENOUS | Status: AC
  Administered 2019-07-13: 250 mL via INTRAVENOUS

## 2019-07-13 MED ORDER — SODIUM CHLORIDE 0.9% FLUSH: 3.0000 mL | Freq: Once | INTRAVENOUS | Status: DC

## 2019-07-13 MED ORDER — SPIRONOLACTONE 25 MG PO TABS
25.0000 mg | ORAL_TABLET | Freq: Every day | ORAL | Status: DC
  Administered 2019-07-13 – 2019-07-18 (×6): 25 mg via ORAL
  Filled 2019-07-13 (×6): qty 1

## 2019-07-13 MED ORDER — CLOPIDOGREL BISULFATE 75 MG PO TABS
75.0000 mg | ORAL_TABLET | Freq: Every day | ORAL | Status: DC
  Administered 2019-07-13 – 2019-07-18 (×6): 75 mg via ORAL
  Filled 2019-07-13 (×6): qty 1

## 2019-07-13 MED ORDER — IOHEXOL 350 MG/ML SOLN
100.0000 mL | Freq: Once | INTRAVENOUS | Status: AC | PRN
  Administered 2019-07-13: 100 mL via INTRAVENOUS

## 2019-07-13 MED ORDER — SODIUM CHLORIDE 0.9 % IV SOLN: 250.0000 mL | INTRAVENOUS | Status: DC | PRN

## 2019-07-13 MED ORDER — FUROSEMIDE 10 MG/ML IJ SOLN
80.0000 mg | Freq: Two times a day (BID) | INTRAMUSCULAR | Status: DC
  Administered 2019-07-13 – 2019-07-16 (×6): 80 mg via INTRAVENOUS
  Filled 2019-07-13 (×6): qty 8

## 2019-07-13 MED ORDER — EMPAGLIFLOZIN 10 MG PO TABS
10.0000 mg | ORAL_TABLET | Freq: Every day | ORAL | Status: DC
  Administered 2019-07-13 – 2019-07-18 (×6): 10 mg via ORAL
  Filled 2019-07-13 (×6): qty 1

## 2019-07-13 MED ORDER — ASPIRIN EC 81 MG PO TBEC
81.0000 mg | DELAYED_RELEASE_TABLET | Freq: Every day | ORAL | Status: DC
  Administered 2019-07-14 – 2019-07-18 (×5): 81 mg via ORAL
  Filled 2019-07-13 (×5): qty 1

## 2019-07-13 MED ORDER — NITROGLYCERIN 0.4 MG SL SUBL: 0.4000 mg | SUBLINGUAL_TABLET | SUBLINGUAL | Status: DC | PRN

## 2019-07-13 MED ORDER — DIGOXIN 125 MCG PO TABS
0.1250 mg | ORAL_TABLET | Freq: Every day | ORAL | Status: DC
  Administered 2019-07-13 – 2019-07-18 (×6): 0.125 mg via ORAL
  Filled 2019-07-13 (×6): qty 1

## 2019-07-13 NOTE — ED Triage Notes (Signed)
Pt has hx of chf. Reports two days of not feeling well with generalized weakness and sob. Denies any pain, except he had episode this am of left side pain from shoulder down to his leg. Has swelling to ankle but unsure if that is normal for him. Airway intact.

## 2019-07-13 NOTE — ED Notes (Signed)
Cards pa at  The bedside

## 2019-07-13 NOTE — ED Provider Notes (Signed)
[facility] EMERGENCY DEPARTMENT Provider Note   CSN: [id number] Arrival date & time: 07/13/19  0920     History Chief Complaint  Patient presents with  . Weakness  . Shortness of Breath    Matthew Mccormick is a 64 y.o. male with PMHx HTN, HLD, diabetes, CKD, stroke who presents to the ED today with complaint of worsening SOB. Pt dates that he felt very weak today.  He states that he had a cramping sensation through his entire left body that lasted several minutes before dissipating.  Patient is poor historian and does not give much information however when asked if he had chest pain he states yes.  Per patient's cousin and healthcare power of attorney he has been generally weak for the past several days as well as shortness of breath not specifically dyspnea on exertion.  She states that he was having hyperglycemia however about 3 to 4 days ago he began having hypoglycemia as low as in the 60s.  She states that she checked it this morning and it was 65 and she gave him some juice.  States she rechecked it is in the 160s.  She states that patient was too fatigued to stand up on his own prompting her to call EMS for assistance.  Does mention that patient's caregiver noted a 7 pound weight gain in the past several days, patient is currently on Lasix 80 mg in the morning and 40 mg at nighttime.  Patient's cousin does not believe he has missed any doses.  The history is provided by the patient, a relative and medical records.       Past Medical History:  Diagnosis Date  . 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 Chronic kidney disease   . Chronic systolic CHF (congestive heart failure) (HCC)   . Diabetes mellitus without complication (HCC)   . Hyperlipidemia   . Hypertension   . Slow rate of speech   . Stroke [facility]) 2019   left and shaky and unsteady on feet.    Patient Active Problem List   Diagnosis Date Noted  . Acute on chronic systolic heart failure  (HCC)   . Pressure injury of skin 03/14/2019  . Ischemic hepatitis   . Sepsis (HCC) 03/13/2019  . Elevated LFTs   . Elevated INR   . Diabetes mellitus (HCC) 07/26/2016  . Hypertension 07/26/2016  . Coronary artery disease 07/26/2016  . History of CVA (cerebrovascular accident) 07/26/2016  . Atrial fibrillation (HCC) 07/26/2016  . Dyslipidemia 07/26/2016  . Former cigarette smoker 07/26/2016  . Osteomyelitis of toe of left foot (HCC) 07/26/2016    Past Surgical History:  Procedure Laterality Date  . CATARACT EXTRACTION W/PHACO Right 06/18/2013   Procedure: CATARACT EXTRACTION PHACO AND INTRAOCULAR LENS PLACEMENT (IOC);  Surgeon: Gemma Payor, MD;  Location: [facility];  Service: Ophthalmology;  Laterality: Right;  CDE 34.55  . CORONARY ATHERECTOMY N/A 03/22/2019   Procedure: CORONARY ATHERECTOMY;  Surgeon: Marykay Lex, MD;  Location: [facility] INVASIVE CV LAB;  Service: Cardiovascular;  Laterality: N/A;  . INTRAVASCULAR PRESSURE WIRE/FFR STUDY N/A 03/26/2019   Procedure: INTRAVASCULAR PRESSURE WIRE/FFR STUDY;  Surgeon: Tonny Bollman, MD;  Location: [facility] INVASIVE CV LAB;  Service: Cardiovascular;  Laterality: N/A;  . INTRAVASCULAR ULTRASOUND/IVUS N/A 03/22/2019   Procedure: Intravascular Ultrasound/IVUS;  Surgeon: Marykay Lex, MD;  Location: [facility] INVASIVE CV LAB;  Service: Cardiovascular;  Laterality: N/A;  . ORIF ANKLE DISLOCATION Right   . RIGHT/LEFT  HEART CATH AND CORONARY ANGIOGRAPHY N/A 03/19/2019   Procedure: RIGHT/LEFT HEART CATH AND CORONARY ANGIOGRAPHY;  Surgeon: Larey Dresser, MD;  Location: [place] CV LAB;  Service: Cardiovascular;  Laterality: N/A;  . VENTRICULAR ASSIST DEVICE INSERTION N/A 03/22/2019   Procedure: VENTRICULAR ASSIST DEVICE INSERTION;  Surgeon: Leonie Man, MD;  Location: [place] CV LAB;  Service: Cardiovascular;  Laterality: N/A;       Family History  Problem Relation Age of Onset  . Liver disease Neg Hx     Social History   Tobacco Use  .  Smoking status: Former Smoker    Packs/day: 1.00    Years: 1.00    Pack years: 1.00    Types: Cigarettes    Quit date: 06/16/1991    Years since quitting: 28.0  . Smokeless tobacco: Never Used  Substance Use Topics  . Alcohol use: No  . Drug use: No    Home Medications Prior to Admission medications   Medication Sig Start Date End Date Taking? Authorizing Provider  aspirin EC 81 MG tablet Take 81 mg by mouth daily.   Yes [provider]  atorvastatin (LIPITOR) 40 MG tablet Take 40 mg by mouth daily.   Yes [provider]  clopidogrel (PLAVIX) 75 MG tablet Take 75 mg by mouth daily.    Yes [provider]  digoxin (LANOXIN) 0.125 MG tablet Take 1 tablet (0.125 mg total) by mouth daily. 05/15/19  Yes Clegg, Amy D, NP  empagliflozin (JARDIANCE) 10 MG TABS tablet Take 10 mg by mouth daily.    Yes [provider]  furosemide (LASIX) 40 MG tablet Take 2 tablets (80 mg total) by mouth in the morning AND 1 tablet (40 mg total) every evening. 05/01/19  Yes Clegg, Amy D, NP  ibuprofen (ADVIL) 200 MG tablet Take 200 mg by mouth every 6 (six) hours as needed for headache.   Yes [provider]  insulin detemir (LEVEMIR) 100 UNIT/ML injection Inject 0.2 mLs (20 Units total) into the skin 2 (two) times daily. Patient taking differently: Inject 15-20 Units into the skin See admin instructions. Taking 20 units in The AM and 15 units in the PM 03/29/19  Yes Gonfa, Taye T, MD  ivabradine (CORLANOR) 7.5 MG TABS tablet Take 1 tablet (7.5 mg total) by mouth 2 (two) times daily with a meal. 05/15/19  Yes Clegg, Amy D, NP  losartan (COZAAR) 25 MG tablet Take 25 mg by mouth daily. 06/24/19  Yes [provider]  metFORMIN (GLUCOPHAGE) 1000 MG tablet Take 1,000 mg by mouth 2 (two) times daily with a meal.   Yes [provider]  spironolactone (ALDACTONE) 25 MG tablet Take 1 tablet (25 mg total) by mouth daily. 03/30/19  Yes Mercy Riding, MD    sacubitril-valsartan (ENTRESTO) 24-26 MG Take 1 tablet by mouth 2 (two) times daily. 05/15/19   Darrick Grinder D, NP    Allergies    Patient has no known allergies.  Review of Systems   Review of Systems  Unable to perform ROS: Other  Respiratory: Positive for shortness of breath.   Cardiovascular: Positive for chest pain.    Physical Exam Updated Vital Signs BP 96/67   Pulse (!) 103   Temp 98 F (36.7 C) (Oral)   Resp 15   SpO2 100%   Physical Exam Vitals and nursing note reviewed.  Constitutional:      Appearance: He is obese. He is not diaphoretic.  HENT:  Head: Normocephalic and atraumatic.  Eyes:     Conjunctiva/sclera: Conjunctivae normal.  Cardiovascular:     Rate and Rhythm: Regular rhythm. Tachycardia present.  Pulmonary:     Effort: Pulmonary effort is normal.     Breath sounds: Normal breath sounds. No decreased breath sounds, wheezing, rhonchi or rales.  Chest:     Chest wall: No tenderness.  Abdominal:     Palpations: Abdomen is soft.     Tenderness: There is no abdominal tenderness. There is no guarding or rebound.  Musculoskeletal:     Cervical back: Neck supple.     Comments: 1+ pitting edema bilaterally  Skin:    General: Skin is warm and dry.     Coloration: Skin is pale.  Neurological:     Mental Status: He is alert.     ED Results / Procedures / Treatments   Labs (all labs ordered are listed, but only abnormal results are displayed) Labs Reviewed  BASIC METABOLIC PANEL - Abnormal; Notable for the following components:      Result Value   Sodium 130 (*)    Chloride 94 (*)    CO2 20 (*)    Glucose, Bld 165 (*)    Anion gap 16 (*)    All other components within normal limits  CBC - Abnormal; Notable for the following components:   WBC 11.7 (*)    All other components within normal limits  BRAIN NATRIURETIC PEPTIDE - Abnormal; Notable for the following components:   B Natriuretic Peptide 1,733.6 (*)    All other components within  normal limits  TROPONIN I (HIGH SENSITIVITY) - Abnormal; Notable for the following components:   Troponin I (High Sensitivity) 172 (*)    All other components within normal limits  TROPONIN I (HIGH SENSITIVITY) - Abnormal; Notable for the following components:   Troponin I (High Sensitivity) 200 (*)    All other components within normal limits  SARS CORONAVIRUS 2 BY RT PCR [facility] ORDER, PERFORMED IN [facility] LAB)    EKG EKG Interpretation  Date/Time:  Friday July 13 2019 09:18:50 EDT Ventricular Rate:  104 PR Interval:  188 QRS Duration: 120 QT Interval:  344 QTC Calculation: 452 R Axis:   -96 Text Interpretation: Sinus tachycardia Right superior axis deviation Inferior infarct , age undetermined Anterior infarct , age undetermined Abnormal ECG No significant change since 03/26/2019 Confirmed by Geoffery Lyons ([id number]) on 07/13/2019 1:38:21 PM   Radiology DG Chest 2 View  Result Date: 07/13/2019 CLINICAL DATA:  Shortness of breath, CHF, generalized weakness pain from LEFT shoulder down to leg, ankle swelling EXAM: CHEST - 2 VIEW COMPARISON:  07/08/2019 FINDINGS: Enlargement of cardiac silhouette with pulmonary vascular congestion. Mediastinal contours normal. Tiny pleural effusion blunts lateral LEFT costophrenic angle. Lungs otherwise clear. No infiltrate or pneumothorax. Old fractures of lateral RIGHT mid ribs. IMPRESSION: Enlargement of cardiac silhouette with slight pulmonary vascular congestion. Tiny LEFT pleural effusion without pulmonary infiltrate. Electronically Signed   By: Ulyses Southward M.D.   On: 07/13/2019 10:11   CT Angio Chest PE W/Cm &/Or Wo Cm  Result Date: 07/13/2019 CLINICAL DATA:  Generalized weakness, shortness of breath EXAM: CT ANGIOGRAPHY CHEST WITH CONTRAST TECHNIQUE: Multidetector CT imaging of the chest was performed using the standard protocol during bolus administration of intravenous contrast. Multiplanar CT image reconstructions and MIPs were  obtained to evaluate the vascular anatomy. CONTRAST:  OMNIPAQUE IOHEXOL 350 MG/ML SOLN COMPARISON:  07/13/2019, 03/13/2019 FINDINGS: Cardiovascular: This is a technically  adequate evaluation of the central and segmental branches of the pulmonary vasculature. The subsegmental branches at the lung bases are incompletely evaluated due to timing of contrast bolus. There are no filling defects or pulmonary emboli. The heart is enlarged without pericardial effusion. Extensive atherosclerosis of the coronary vasculature is again noted. Minimal atherosclerosis of the aortic arch unchanged. Mediastinum/Nodes: No enlarged mediastinal, hilar, or axillary lymph nodes. Thyroid gland, trachea, and esophagus demonstrate no significant findings. Lungs/Pleura: There are small bilateral pleural effusions volume estimated less than 500 cc each. Minimal dependent atelectasis. No acute airspace disease. No pneumothorax. The central airways are patent. Upper Abdomen: There is trace free fluid surrounding the dome of the liver. Otherwise the upper abdomen is unremarkable. Musculoskeletal: There are prior healed bilateral rib fractures. No acute or destructive bony lesions. Reconstructed images demonstrate no additional findings. Review of the MIP images confirms the above findings. IMPRESSION: 1. No evidence of pulmonary embolus. 2. Small bilateral pleural effusions. 3. Trace ascites surrounding the dome of the liver. 4. Aortic Atherosclerosis (ICD10-I70.0). Electronically Signed   By: Sharlet Salina M.D.   On: 07/13/2019 16:29    Procedures Procedures (including critical care time)  Medications Ordered in ED Medications  sodium chloride flush (NS) 0.9 % injection 3 mL (3 mLs Intravenous Not Given 07/13/19 1413)  furosemide (LASIX) injection 80 mg (has no administration in time range)  aspirin EC tablet 81 mg (has no administration in time range)  atorvastatin (LIPITOR) tablet 40 mg (has no administration in time range)    clopidogrel (PLAVIX) tablet 75 mg (has no administration in time range)  digoxin (LANOXIN) tablet 0.125 mg (has no administration in time range)  empagliflozin (JARDIANCE) tablet 10 mg (has no administration in time range)  ivabradine (CORLANOR) tablet 7.5 mg (has no administration in time range)  losartan (COZAAR) tablet 25 mg (has no administration in time range)  insulin detemir (LEVEMIR) injection 10 Units (has no administration in time range)  spironolactone (ALDACTONE) tablet 25 mg (has no administration in time range)  furosemide (LASIX) injection 60 mg (60 mg Intravenous Given 07/13/19 1413)  sodium chloride 0.9 % bolus 250 mL (0 mLs Intravenous Stopped 07/13/19 1441)  iohexol (OMNIPAQUE) 350 MG/ML injection 100 mL (100 mLs Intravenous Contrast Given 07/13/19 1600)    ED Course  I have reviewed the triage vital signs and the nursing notes.  Pertinent labs & imaging results that were available during my care of the patient were reviewed by me and considered in my medical decision making (see chart for details).    MDM Rules/Calculators/A&P                      64 year old male who presents to the ED today initially complaining of generalized weakness and shortness of breath.  He is a poor historian and unable to give much history.  He describes a cramping sensation on the left side of his body that lasted a couple of minutes earlier today.  When questioning his symptoms I specifically asked if he was having chest pain he states yes.  Caregiver arrives later and gives additional information, dyspnea on exertion for the past several days as well as transient hypoglycemia.  On arrival to the ED patient is afebrile and nontachypneic.  Mildly tachycardic in the very low 100s.  Glucose here today 165.  He denies any active chest pain currently.  Patient does have 1+ edema bilaterally.  Cousin also reports 7 pound weight gain recently.  Patient does  have history of CHF with 20 to 25% EF.  Will  work-up for this at this time.  Will work-up for chest pain with ACS.   CXR with mild vascular congestion CBC with mildly elevated WBC at 11.7; it does appear pt is currently on abx for a sebaceous cyst infection of L arm - seeing wound clinic, last saw 2 days ago.  BMP with sodium 130, glucose 165, bicarb 20, chloride 94, and gap of 16.  Initial troponin of 172 however pt appears to have chronically elevated troponins. Will repeat.  BNP 1,733.6.   Given elevated troponin and BNP with mild elevated HR will obtain CTA at this time to rule out PE. Would suspect hypertension with elevated BNP if true CHF exacerbation however will provide IV lasix. Pt does appear mildly dry which could be contributing to tachycardia and soft BP as well - will provide a very small amount of fluid given EF 20-25%.   Repeat troponin at 200. Will consult cardiology at this time. Pt will need admission.   Discussed case with Rosann Auerbach - cardiology to come see patient.   Cardiology to admit.   Final Clinical Impression(s) / ED Diagnoses Final diagnoses:  Acute on chronic congestive heart failure, unspecified heart failure type [facility])    Rx / DC Orders ED Discharge Orders    None       Tanda Rockers, PA-C 07/13/19 1644    Geoffery Lyons, MD 07/16/19 [phone]

## 2019-07-13 NOTE — H&P (Addendum)
Cardiology History and Physical:   Patient ID: Matthew [facility]; [id number]; 1956/01/02   Admit date: 07/13/2019 Date of Consult: 07/13/2019  Primary Care Provider: Monico Blitz, MD Primary Cardiologist: Matthew Sable, MD 03/14/19 in-hospital Primary Electrophysiologist:  None Advanced Heart Failure: Matthew Champagne, MD   Patient Profile:   Matthew [facility] is a 64 y.o. male with a hx of HTN, hyperlipidemia, DMII, CVAwith left hemiparesis, 2018 ECHO at [facility] with EF30-35% (normal RV), EF <15% echo 03/2019 w/ CGS, who is being seen today for the evaluation of chest pain at the request of [facility].  History of Present Illness:   [facility] presented to [facility] on 1/29 with weakness and fatigue. AST 110, ALT 90, WBC 11.6. CT of head negative for acute findings but did have chronic infarcts inright occipital lobe, left frontal lobe and posterior parietal region on the left. He was noted to have cool extremities with purplish color with blisters. Able to doppler pulses at that time. Dopplers negative for DVT. Discharged to home the same day on keflex for cellulitis.   He returned to [facility] on 03/13/19 after being found on the floor by his family. He did not lose consciousness but could not get off the floor for some time.Lives alone.CXR was ok. CTA concerning for RLL pneumonia. CT head - no acute findings. Hypotensive in the ED. Lactic acid was 3.6, WBC 16, AST 1189, ALT 930, HS Trop 109>105, COVID negative. Blood Cultures obtained and started on antibiotics. GI consulted for elevated LFTs. Hepatitis panel negative. Placed on IV fluids for hypotension with no improvement. CCM consulted for septic shock. Cardiology consulted for elevated HS Trop and low EF. Not felt to be acute coronary syndrome. Echoshowed low EF < 15% with severe RV dysfunction. He was subsequently transferred to [facility] for shock management and AHF consultation. Placed on IV lasix for volume overload + milrinone for low output.   Had [facility]  on 2/8 showing mildly elevated filling pressures, mild pulmonary venous hypertension, low cardiac index (1.98) and severe 3V CAD. He was not felt to be a good CABG candidate due to poor functional capacity at baseline. Decision was made to proceed w/ PCI w/ Impella support.  He underwent complex PCI with atherectomy of the left main coronary2/11 using hemodynamic support with Impella. He returned to cath lab 2/15 for possible PCI of lesions in the ostial/proximal RCA and PLA branches, however DFR measured at 0.91. Lesions not hemodynamically significant. No indication for PCI RCA/PLB. Post PCI of LM, he was placed on ASA and Plavix. Impella removed. From a HF standpoint, he diuresed well w/ IV Lasix and was transitioned to PO Lasix. He was ultimately weaned off milrinone and placed on GDMT (losartan, spiro, digoxin, ivabradine and Jardiance). BP too soft for Entresto. Due to weakness/deconditioning, PT recommended discharge to SNF for rehabilitation.   05/15/2019 office visit for follow up: Last visit lasix was increased to 80 mg /40 mg . He was discharged from SNF.  Overall feeling fine. Denies SOB/PND/Orthopnea. Able to walk more at home. Appetite ok. No fever or chills. Weight at home 200-201 pounds. Taking all medications but has not been taking ivabradine. Taking all medications provided by facility. Plans for therapy 3 times a week.  He has in home help 8 hours per day. He is living at home alone with help.   Followed by [facility] for RN/PT. Losartan stopped and pt to be on Entresto 24-26 bid. BMET in 7 days. DAPT indefinitely.   06/03: [facility]  call, pt w/ infected sebaceous cyst s/p removal. Fall w/ scalp lac, CBG 60s at times, appetite is decreased, [name] (cousin) will be traveling soon, CAP worker 6 d/week  06/04: pt came to ER w/ gen weakness and SOB, +LE edema>>cards asked to see.  His caregiver is at the bedside with him.  His caregiver reports that he has gained about 10 pounds in the last couple of  weeks.  Tries to give him low sodium foods, but he is not really vigilant about hidden sodium in foods.  He reports that he has been more short of breath with exertion recently, progression of this is unclear.  Today, the caregiver says that he was not able to walk room to room without gasping for breath.  He says that he had some orthopnea today, and some PND.  He also had a cramping left-sided pain that went from his arm down his side and down his leg.  He has not had exertional chest pain.  He has not had central chest pain.  He says his legs have been up and down, but they are edematous and have been weeping.  He has not had palpitations, presyncope or syncope.   Past Medical History:  Diagnosis Date  . 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 Chronic kidney disease   . Chronic systolic CHF (congestive heart failure) (HCC)   . Diabetes mellitus without complication (HCC)   . Hyperlipidemia   . Hypertension   . Slow rate of speech   . Stroke [facility]) 2019   left and shaky and unsteady on feet.    Past [facility] History:  Procedure Laterality Date  . CATARACT EXTRACTION W/PHACO Right 06/18/2013   Procedure: CATARACT EXTRACTION PHACO AND INTRAOCULAR LENS PLACEMENT (IOC);  Surgeon: Gemma Payor, MD;  Location: [facility];  Service: Ophthalmology;  Laterality: Right;  CDE 34.55  . CORONARY ATHERECTOMY N/A 03/22/2019   Procedure: CORONARY ATHERECTOMY;  Surgeon: Marykay Lex, MD;  Location[facility];  Service: Cardiovascular;  Laterality: N/A;  . INTRAVASCULAR PRESSURE WIRE/FFR STUDY N/A 03/26/2019   Procedure: INTRAVASCULAR PRESSURE WIRE/FFR STUDY;  Surgeon: Tonny Bollman, MD;  Location: [facility] INVASIVE CV LAB;  Service: Cardiovascular;  Laterality: N/A;  . INTRAVASCULAR ULTRASOUND/IVUS N/A 03/22/2019   Procedure: Intravascular Ultrasound/IVUS;  Surgeon: Marykay Lex, MD;  Location: [facility];  Service: Cardiovascular;  Laterality: N/A;  . ORIF ANKLE  DISLOCATION Right   . RIGHT/LEFT HEART CATH AND CORONARY ANGIOGRAPHY N/A 03/19/2019   Procedure: RIGHT/LEFT HEART CATH AND CORONARY ANGIOGRAPHY;  Surgeon: Laurey Morale, MD;  [facility];  Service: Cardiovascular;  Laterality: N/A;  . VENTRICULAR ASSIST DEVICE INSERTION N/A 03/22/2019   Procedure: VENTRICULAR ASSIST DEVICE INSERTION;  Surgeon: Marykay Lex, MD;  Location: [facility] INVASIVE CV LAB;  Service: Cardiovascular;  Laterality: N/A;     Prior to Admission medications   Medication Sig Start Date End Date Taking? Authorizing Provider  aspirin EC 81 MG tablet Take 81 mg by mouth daily.   Yes [provider]  atorvastatin (LIPITOR) 40 MG tablet Take 40 mg by mouth daily.   Yes [provider]  clopidogrel (PLAVIX) 75 MG tablet Take 75 mg by mouth daily.    Yes [provider]  digoxin (LANOXIN) 0.125 MG tablet Take 1 tablet (0.125 mg total) by mouth daily. 05/15/19  Yes Clegg, Amy D, NP  empagliflozin (JARDIANCE) 10 MG TABS tablet Take 10 mg by mouth daily.  Yes [provider]  furosemide (LASIX) 40 MG tablet Take 2 tablets (80 mg total) by mouth in the morning AND 1 tablet (40 mg total) every evening. 05/01/19  Yes Clegg, Amy D, NP  ibuprofen (ADVIL) 200 MG tablet Take 200 mg by mouth every 6 (six) hours as needed for headache.   Yes [provider]  insulin detemir (LEVEMIR) 100 UNIT/ML injection Inject 0.2 mLs (20 Units total) into the skin 2 (two) times daily. Patient taking differently: Inject 15-20 Units into the skin See admin instructions. Taking 20 units in The AM and 15 units in the PM 03/29/19  Yes Gonfa, Taye T, MD  ivabradine (CORLANOR) 7.5 MG TABS tablet Take 1 tablet (7.5 mg total) by mouth 2 (two) times daily with a meal. 05/15/19  Yes Clegg, Amy D, NP  losartan (COZAAR) 25 MG tablet Take 25 mg by mouth daily. 06/24/19  Yes [provider]  metFORMIN (GLUCOPHAGE) 1000 MG tablet Take 1,000 mg by mouth 2 (two)  times daily with a meal.   Yes [provider]  spironolactone (ALDACTONE) 25 MG tablet Take 1 tablet (25 mg total) by mouth daily. 03/30/19  Yes Almon Hercules, MD  sacubitril-valsartan (ENTRESTO) 24-26 MG Take 1 tablet by mouth 2 (two) times daily. 05/15/19   Tonye Becket D, NP    Inpatient Medications: Scheduled Meds: . aspirin  325 mg Oral Daily  . sodium chloride flush  3 mL Intravenous Once   Continuous Infusions:  PRN Meds:   Allergies:   No Known Allergies  Social History:   Social History   Socioeconomic History  . Marital status: Single    Spouse name: Not on file  . Number of children: Not on file  . Years of education: 9th grade  . Highest education level: Not on file  Occupational History  . Not on file  Tobacco Use  . Smoking status: Former Smoker    Packs/day: 1.00    Years: 1.00    Pack years: 1.00    Types: Cigarettes    Quit date: 06/16/1991    Years since quitting: 28.0  . Smokeless tobacco: Never Used  Substance and Sexual Activity  . Alcohol use: No  . Drug use: No  . Sexual activity: Yes    Birth control/protection: None  Other Topics Concern  . Not on file  Social History Narrative   Pt lives alone in 1 story home   8th grade education   Has home health aid 6days/week   Social Determinants of Health   Financial Resource Strain: Medium Risk  . Difficulty of Paying Living Expenses: Somewhat hard  Food Insecurity: No Food Insecurity  . Worried About Programme researcher, broadcasting/film/video in the Last Year: Never true  . Ran Out of Food in the Last Year: Never true  Transportation Needs: No Transportation Needs  . Lack of Transportation (Medical): No  . Lack of Transportation (Non-Medical): No  Physical Activity: Insufficiently Active  . Days of Exercise per Week: 3 days  . Minutes of Exercise per Session: 10 min  Stress: No Stress Concern Present  . Feeling of Stress : Not at all  Social Connections: Slightly Isolated  . Frequency of Communication  with Friends and Family: More than three times a week  . Frequency of Social Gatherings with Friends and Family: More than three times a week  . Attends Religious Services: 1 to 4 times per year  . Active Member of Clubs or Organizations: Yes  .  Attends Banker Meetings: 1 to 4 times per year  . Marital Status: Never married  Intimate Partner Violence: Not At Risk  . Fear of Current or Ex-Partner: No  . Emotionally Abused: No  . Physically Abused: No  . Sexually Abused: No    Family History:   Family History  Problem Relation Age of Onset  . Liver disease Neg Hx    Family Status:  Family Status  Relation Name Status  . Neg Hx  (Not Specified)    ROS:  Please see the history of present illness.  All other ROS reviewed and negative.     Physical Exam/Data:   Vitals:   07/13/19 1400 07/13/19 1430 07/13/19 1445 07/13/19 1500  BP: 118/89 115/83 104/90 111/82  Pulse:  94 93 92  Resp: Temp:      TempSrc:      SpO2:  99% 99% 98%   No intake or output data in the 24 hours ending 07/13/19 1607  Last 3 Weights 05/15/2019 05/01/2019 03/30/2019  Weight (lbs) 200 lb 12.8 oz 192 lb 210 lb 15.7 oz  Weight (kg) 91.082 kg 87.091 kg 95.7 kg     There is no height or weight on file to calculate BMI.   General:  Well nourished, chronically ill appearing, male in no acute distress HEENT: normal for age Lymph: no adenopathy Neck: JVD -mildly elevated, positive hepatojugular reflux Endocrine:  No thryomegaly Vascular: No carotid bruits; upper extremity pulses 2+, lower extremity pulses difficult to palpate due to edema Cardiac:  normal S1, S2; RRR; no murmur Lungs: Decreased breath sounds with Rales bases bilaterally, no wheezing, rhonchi  Abd: soft, nontender, no hepatomegaly  Ext: 2+ edema Musculoskeletal:  No deformities, BUE and BLE strength weak, R stronger than L Skin: warm and dry, blisters on lower extremities, some weeping Neuro:  CNs 2-12 intact, no  focal abnormalities noted Psych:  Normal affect   EKG:  The EKG was personally reviewed and demonstrates: Sinus tachycardia, heart rate 104, left bundle morphology with QRS duration 120 ms, QRS duration was 112 ms on 2/15 ECG Telemetry:  Telemetry was personally reviewed and demonstrates: Sinus rhythm   CV studies:   ECHO: 03/14/2019 (repeat ordered for 08/2019) 1. Left ventricular ejection fraction, by visual estimation, is 20 to  25%. The left ventricle has severely decreased function. There is no left ventricular hypertrophy.  2. Definity contrast agent was given IV to delineate the left ventricular  endocardial borders.  3. Elevated left ventricular end-diastolic pressure.  4. Indeterminate diastolic filling due to E-A fusion.  5. The left ventricle demonstrates global hypokinesis.  6. Global right ventricle has severely reduced systolic function.The  right ventricular size is mildly enlarged. No increase in right  ventricular wall thickness.  7. Left atrial size was normal.  8. Right atrial size was mildly dilated.  9. Mild mitral annular calcification.  10. The mitral valve is grossly normal. Mild mitral valve regurgitation.  11. The tricuspid valve is normal in structure.  12. The tricuspid valve is normal in structure. Tricuspid valve  regurgitation is mild.  13. The aortic valve is tricuspid. Aortic valve regurgitation is not  visualized. No evidence of aortic valve sclerosis or stenosis.  14. The pulmonic valve was grossly normal. Pulmonic valve regurgitation is not visualized.  15. Moderately elevated pulmonary artery systolic pressure.  16. The inferior vena cava is dilated in size with >50% respiratory  variability, suggesting right atrial pressure of  8 mmHg.   CATH: 03/26/2019 Moderate obstructive disease of the proximal/ostial RCA as well as a large posterolateral branch, both lesions interrogated with DFR and are shown to not be hemodynamically significant.   Recommend continued medical therapy. Diagnostic Dominance: Right    CATH: 03/22/2019  Mid LM to Prox LAD lesion is 80% stenosed with 65% stenosed side branch in Ramus. Prox LAD to Mid LAD lesion is 70% stenosed.  A drug-eluting stent was successfully placed from the LEFT MAIN into the proximal LAD using a SYNERGY XD 3.0X38. = The stent was upsized to 4.3 mm in the left main and 3.3 mm in the LAD  Post intervention, there is a 0% residual stenosis in the LEFT MAIN and LAD.  Post PTCA intervention, the side branch was reduced to 10% residual stenosis.  Ramus-1 lesion is 70% stenosed. Post PTCA 2.0 mm balloon intervention, there is a 35% residual stenosis.  Ramus-2 lesion is 75% stenosed. This lesion was not successfully crossed. Post intervention, there is a 70% residual stenosis.  1st Diag lesion is 90% stenosed.  Dist LAD lesion is 99% stenosed. Post intervention (simply with the guidewire advanced to the distal LAD, the vessel recanalized), there is a 80% residual stenosis.  A drug-eluting stent was successfully placed using a SYNERGY XD 3.0X38.   SUMMARY  Successful atherectomy based PCI of LEFT MAIN-PROXIMAL LAD with placement of 3.0 mm x 38 mm Synergy DES stent postdilated to 4.3 mm in the LEFT MAIN and 3.3 mm in the LAD.  Balloon angioplasty only of the ostial and proximal ramus intermedius (unable to pass one 0 mm balloon beyond the lesion)  RECOMMENDATIONS  Patient be transferred back to CCU.  We have applied FemoStop on the left femoral access site which appears to be well controlled.  Plan will be to maintain FemoStop at low pressure for roughly 1 hour and then begin to wean off.  Would consider evaluation and treatment of the RCA lesions at a later date, but would allow for time to recover from this procedure over the weekend.  Given concern for possible blood loss during sheath exchange, we have written for 1 unit transfusion PRBC.   Laboratory Data:    Chemistry Recent Labs  Lab 07/13/19 0939  NA 130*  K 4.2  CL 94*  CO2 20*  GLUCOSE 165*  BUN 15  CREATININE 0.99  CALCIUM 9.0  GFRNONAA >60  GFRAA >60  ANIONGAP 16*    Lab Results  Component Value Date   ALT 29 04/09/2019   AST 30 04/09/2019   ALKPHOS 105 04/09/2019   BILITOT 1.5 (H) 04/09/2019   Hematology Recent Labs  Lab 07/13/19 0939  WBC 11.7*  RBC 4.23  HGB 13.1  HCT 41.2  MCV 97.4  MCH 31.0  MCHC 31.8  RDW 15.2  PLT 334   Cardiac Enzymes High Sensitivity Troponin:   Recent Labs  Lab 07/13/19 0939 07/13/19 1226  TROPONINIHS 172* 200*      BNP Recent Labs  Lab 07/13/19 0939  BNP 1,733.6*    TSH:  Lab Results  Component Value Date   TSH 0.599 03/14/2019   Lipids: Lab Results  Component Value Date   CHOL 97 03/28/2019   HDL 23 (L) 03/28/2019   LDLCALC 62 03/28/2019   TRIG 60 03/28/2019   CHOLHDL 4.2 03/28/2019   HgbA1c: Lab Results  Component Value Date   HGBA1C 6.3 (H) 03/14/2019   Magnesium:  Magnesium  Date Value Ref Range Status  03/30/2019  1.8 1.7 - 2.4 mg/dL Final    Comment:    Performed at [facility], [address] [place], [place] [address]     Radiology/Studies:  DG Chest 2 View  Result Date: 07/13/2019 CLINICAL DATA:  Shortness of breath, CHF, generalized weakness pain from LEFT shoulder down to leg, ankle swelling EXAM: CHEST - 2 VIEW COMPARISON:  07/08/2019 FINDINGS: Enlargement of cardiac silhouette with pulmonary vascular congestion. Mediastinal contours normal. Tiny pleural effusion blunts lateral LEFT costophrenic angle. Lungs otherwise clear. No infiltrate or pneumothorax. Old fractures of lateral RIGHT mid ribs. IMPRESSION: Enlargement of cardiac silhouette with slight pulmonary vascular congestion. Tiny LEFT pleural effusion without pulmonary infiltrate. Electronically Signed   By: Ulyses Southward M.D.   On: 07/13/2019 10:11    Assessment and Plan:   1.  Acute on chronic systolic CHF -His weight  is up 10 pounds and he has volume overload on exam -He got Lasix 60 mg IV in the emergency room -His current Lasix dose is 80 mg a.m., 40 mg p.m. -Admit, diurese with Lasix 80 mg IV twice daily -Follow renal function, weights and BMET daily -CHF team to follow and determine additional therapies but right now, he does not appear to be in cardiogenic shock -ER MD has ordered CTA chest PE protocol, follow-up on results - continue ivabradine, losartan, spironolactone, digoxin, check dig level  2.  Elevated troponin, history of CAD: -His only chest pain is a cramping chest pain that went all the way down the left side of his body. -Suspect the elevated troponin is secondary to his CHF -Continue, high-dose statin, Plavix, aspirin -Follow troponin trend, MD advise on heparin   Otherwise, continue home medications.  See phone note regarding Entresto losartan and lisinopril.  We need to clarify.  Hold off on starting Entresto until we can confirm that he has not had lisinopril.  Principal Problem:   Acute on chronic systolic heart failure (HCC)     For questions or updates, please contact [facility] Please consult [email or web address] for contact info under Cardiology/STEMI.   Signed, Theodore Demark, PA-C  07/13/2019 4:07 PM  History and all data above reviewed.  Patient examined.  I agree with the findings as above.  His hemoglobin with him.  He has a caregiver.  They are in their 91s with no significant.  He did have a fall recently.  He also had a cyst removed required antibiotics.  He has not been eating well.  His blood pressures have been low.  He is now gained about 7 pounds daily.  Because of increased swelling in his lower extremities along with blisters, failure to thrive, decreased appetite and increased shortness of breath they brought him in for further evaluation.  He is not able to give much history.  He is short of breath.  Not having chest pressure, neck or arm discomfort.   In the  ER on CT he has increased fluid.  CXR also suggest increased edema.  The patient exam reveals COR: Positive jugular distention, regular rate and rhythm, distant heart sounds  ,  Lungs: Decreased breath sounds with bilateral crackles,  Abd: Mildly distended.  Bowel sounds no rebound, no guarding, Ext severe edema to above the knees.  He does have small healed wound on his right great toe and the plantar surface of his foot.  All available labs, radiology testing, previous records reviewed. Agree with documented assessment and plan.   Acute on chronic systolic HF: Reason for his  exacerbation is not entirely clear although he has had a recent event of cyst removal and antibiotic he thinks contributed to some change in appetite and diet.  He is at best tenuous.  He does have an elevated troponin consistent with demand ischemia.  I would not suggest heparin is needed.  He needs to be admitted for IV diuresis.  Agree with plans for Entresto if we can confirm that he has not been on ACE inhibitor.  IV diuresis.       Fayrene Fearing Cejay Cambre  5:07 PM  07/13/2019.

## 2019-07-13 NOTE — ED Notes (Signed)
Family at bedside. 

## 2019-07-13 NOTE — ED Notes (Signed)
PA Margaux Hyman Hopes made aware of critical Troponin

## 2019-07-13 NOTE — Telephone Encounter (Signed)
Pharmacy tech with [facility] called to review medication list Reports entresto was on list however patient never picked up Taking losartan daily Lisinopril is on patients list however family cannot confirm or deny if he is taking   Advised to complete ER visit and follow instructions given by ER provider  Will forward to provider as fyi only

## 2019-07-13 NOTE — ED Notes (Signed)
The pt just returned from xray  Alert asking for water given

## 2019-07-13 NOTE — ED Notes (Signed)
Patient has weeping blisters to right lower leg, patient wasn't aware they were weeping

## 2019-07-14 ENCOUNTER — Encounter (HOSPITAL_COMMUNITY): Payer: Self-pay | Admitting: Cardiology

## 2019-07-14 ENCOUNTER — Inpatient Hospital Stay (HOSPITAL_COMMUNITY): Payer: Medicare Other

## 2019-07-14 DIAGNOSIS — I5031 Acute diastolic (congestive) heart failure: Secondary | ICD-10-CM

## 2019-07-14 LAB — GLUCOSE, CAPILLARY
Glucose-Capillary: 78 mg/dL (ref 70–99)
Glucose-Capillary: 133 mg/dL — ABNORMAL HIGH (ref 70–99)
Glucose-Capillary: 150 mg/dL — ABNORMAL HIGH (ref 70–99)
Glucose-Capillary: 150 mg/dL — ABNORMAL HIGH (ref 70–99)

## 2019-07-14 LAB — BASIC METABOLIC PANEL
Anion gap: 9 (ref 5–15)
BUN: 13 mg/dL (ref 8–23)
CO2: 25 mmol/L (ref 22–32)
Calcium: 8.7 mg/dL — ABNORMAL LOW (ref 8.9–10.3)
Chloride: 100 mmol/L (ref 98–111)
Creatinine, Ser: 0.77 mg/dL (ref 0.61–1.24)
GFR calc Af Amer: >60 mL/min (ref >60)
GFR calc non Af Amer: >60 mL/min (ref >60)
Glucose, Bld: 77 mg/dL (ref 70–99)
Potassium: 3.7 mmol/L (ref 3.5–5.1)
Sodium: 134 mmol/L — ABNORMAL LOW (ref 135–145)

## 2019-07-14 LAB — ECHOCARDIOGRAM COMPLETE: Weight: 3296 oz

## 2019-07-14 MED ORDER — POTASSIUM CHLORIDE CRYS ER 20 MEQ PO TBCR
40.0000 meq | EXTENDED_RELEASE_TABLET | Freq: Once | ORAL | Status: AC
  Administered 2019-07-14: 40 meq via ORAL
  Filled 2019-07-14: qty 2

## 2019-07-14 MED ORDER — ROSUVASTATIN CALCIUM 20 MG PO TABS: 20.0000 mg | ORAL_TABLET | Freq: Every day | ORAL | Status: DC

## 2019-07-14 MED ORDER — PERFLUTREN LIPID MICROSPHERE
1.0000 mL | INTRAVENOUS | Status: AC | PRN
  Administered 2019-07-14: 2 mL via INTRAVENOUS
  Filled 2019-07-14: qty 10

## 2019-07-14 MED ORDER — SACUBITRIL-VALSARTAN 24-26 MG PO TABS
1.0000 | ORAL_TABLET | Freq: Two times a day (BID) | ORAL | Status: DC
  Administered 2019-07-15 – 2019-07-16 (×3): 1 via ORAL
  Filled 2019-07-14 (×5): qty 1

## 2019-07-14 NOTE — Consult Note (Signed)
WOC Nurse Consult Note: Reason for Consult: Full thickness wound at posterior left arm, site of cyst removal. Wound type: Surgical Pressure Injury POA: N/A Measurement: 2.5cm x 1.2cm x 2cm  Wound bed:red, moist Drainage (amount, consistency, odor) serosanguinous drainage in moderate amount on old dressing Periwound: Intact. Mild erythema. No warmth or induration. Dressing procedure/placement/frequency: I will provide Nursing with guidance in the daily topical care of this wound using NS to cleanse and a silver hydrofiber (Acquacel Advantage, Lawson # [id number]) strip dressing cut from a 4x4 inch piece to fill and wick away exudate into a topper dressing.  Daily changes are indicated.   Patient should follow up with the provider performing the procedure as they instructed post discharge.  WOC nursing team will not follow, but will remain available to this patient, the nursing and medical teams.  Please re-consult if needed. Thanks, Ladona Mow, MSN, RN, GNP, [place]  Pager# [phone]

## 2019-07-14 NOTE — Progress Notes (Signed)
  Echocardiogram 2D Echocardiogram with contrast has been performed.  [name] F 07/14/2019, 12:59 PM

## 2019-07-14 NOTE — Progress Notes (Signed)
Patient ID: Matthew Mccormick, male   DOB: [birth year], 64 y.o.   MRN: [id number]     Advanced Heart Failure Rounding Note  PCP-Cardiologist: Prentice Docker, MD   Subjective:    No complaints today.  Has not been up yet.  No chest pain.   CTA chest: No PE, small effusions   Objective:   Weight Range: 93.4 kg Body mass index is 27.94 kg/m.   Vital Signs:   Temp:  [97.5 F (36.4 C)-98.6 F (37 C)] 97.5 F (36.4 C) (06/05 0759) Pulse Rate:  [75-107] 91 (06/05 1011) Resp:  [0-23] 20 (06/05 0759) BP: (83-122)/(64-97) 96/68 (06/05 1011) SpO2:  [93 %-100 %] 100 % (06/05 0743) Weight:  [93.4 kg] 93.4 kg (06/05 0133) Last BM Date: 07/13/19  Weight change: Filed Weights   07/14/19 0133  Weight: 93.4 kg    Intake/Output:   Intake/Output Summary (Last 24 hours) at 07/14/2019 1108 Last data filed at 07/14/2019 0900 Gross per 24 hour  Intake 240 ml  Output 2075 ml  Net -1835 ml      Physical Exam    General:  Well appearing. No resp difficulty HEENT: Normal Neck: Supple. JVP 14. Carotids 2+ bilat; no bruits. No lymphadenopathy or thyromegaly appreciated. Cor: PMI nondisplaced. Regular rate & rhythm. No rubs, gallops or murmurs. Lungs: Clear Abdomen: Soft, nontender, nondistended. No hepatosplenomegaly. No bruits or masses. Good bowel sounds. Extremities: No cyanosis, clubbing, rash. 1+ edema 1/2 to knees bilaterally.  Neuro: Alert & orientedx3, cranial nerves grossly intact. moves all 4 extremities w/o difficulty. Affect pleasant   Telemetry   NSR 80s (personally reviewed)  Labs    CBC Recent Labs    07/13/19 0939 07/13/19 1928  WBC 11.7* 9.9  HGB 13.1 12.6*  HCT 41.2 38.2*  MCV 97.4 93.4  PLT 334 317   Basic Metabolic Panel Recent Labs    94/70/96 0939 07/13/19 0939 07/13/19 1928 07/14/19 0654  NA 130*  --   --  134*  K 4.2  --   --  3.7  CL 94*  --   --  100  CO2 20*  --   --  25  GLUCOSE 165*  --   --  77  BUN 15  --   --  13  CREATININE 0.99    < > 0.88 0.77  CALCIUM 9.0  --   --  8.7*   < > = values in this interval not displayed.   Liver Function Tests Recent Labs    07/13/19 1928  AST 42*  ALT 20  ALKPHOS 82  BILITOT 1.8*  PROT 6.7  ALBUMIN 3.4*   No results for input(s): LIPASE, AMYLASE in the last 72 hours. Cardiac Enzymes No results for input(s): CKTOTAL, CKMB, CKMBINDEX, TROPONINI in the last 72 hours.  BNP: BNP (last 3 results) Recent Labs    03/14/19 1832 07/13/19 0939  BNP 1,338.6* 1,733.6*    ProBNP (last 3 results) No results for input(s): PROBNP in the last 8760 hours.   D-Dimer No results for input(s): DDIMER in the last 72 hours. Hemoglobin A1C Recent Labs    07/13/19 1928  HGBA1C 6.7*   Fasting Lipid Panel No results for input(s): CHOL, HDL, LDLCALC, TRIG, CHOLHDL, LDLDIRECT in the last 72 hours. Thyroid Function Tests Recent Labs    07/13/19 1928  TSH 1.374    Other results:   Imaging    CT Angio Chest PE W/Cm &/Or Wo Cm  Result Date: 07/13/2019 CLINICAL DATA:  Generalized weakness, shortness of breath EXAM: CT ANGIOGRAPHY CHEST WITH CONTRAST TECHNIQUE: Multidetector CT imaging of the chest was performed using the standard protocol during bolus administration of intravenous contrast. Multiplanar CT image reconstructions and MIPs were obtained to evaluate the vascular anatomy. CONTRAST:  OMNIPAQUE IOHEXOL 350 MG/ML SOLN COMPARISON:  07/13/2019, 03/13/2019 FINDINGS: Cardiovascular: This is a technically adequate evaluation of the central and segmental branches of the pulmonary vasculature. The subsegmental branches at the lung bases are incompletely evaluated due to timing of contrast bolus. There are no filling defects or pulmonary emboli. The heart is enlarged without pericardial effusion. Extensive atherosclerosis of the coronary vasculature is again noted. Minimal atherosclerosis of the aortic arch unchanged. Mediastinum/Nodes: No enlarged mediastinal, hilar, or axillary lymph  nodes. Thyroid gland, trachea, and esophagus demonstrate no significant findings. Lungs/Pleura: There are small bilateral pleural effusions volume estimated less than 500 cc each. Minimal dependent atelectasis. No acute airspace disease. No pneumothorax. The central airways are patent. Upper Abdomen: There is trace free fluid surrounding the dome of the liver. Otherwise the upper abdomen is unremarkable. Musculoskeletal: There are prior healed bilateral rib fractures. No acute or destructive bony lesions. Reconstructed images demonstrate no additional findings. Review of the MIP images confirms the above findings. IMPRESSION: 1. No evidence of pulmonary embolus. 2. Small bilateral pleural effusions. 3. Trace ascites surrounding the dome of the liver. 4. Aortic Atherosclerosis (ICD10-I70.0). Electronically Signed   By: Sharlet Salina M.D.   On: 07/13/2019 16:29      Medications:     Scheduled Medications:  aspirin EC  81 mg Oral Daily   atorvastatin  40 mg Oral Daily   clopidogrel  75 mg Oral Daily   digoxin  0.125 mg Oral Daily   empagliflozin  10 mg Oral Daily   enoxaparin (LOVENOX) injection  40 mg Subcutaneous Q24H   furosemide  80 mg Intravenous BID   insulin detemir  10 Units Subcutaneous BID   ivabradine  7.5 mg Oral BID WC   [START ON 07/15/2019] sacubitril-valsartan  1 tablet Oral BID   sodium chloride flush  3 mL Intravenous Once   sodium chloride flush  3 mL Intravenous Q12H   spironolactone  25 mg Oral Daily     Infusions:  sodium chloride       PRN Medications:  sodium chloride, acetaminophen, ALPRAZolam, nitroGLYCERIN, ondansetron (ZOFRAN) IV, sodium chloride flush, zolpidem    Assessment/Plan   1. Acute on chronic systolic CHF: Echo in 2018 with EF 30-35%. Admission to [facility] 2/21 for a/c CHF w/ low output. ECHO 03/14/19  EF down to 10-15% with severe RV dysfunction on echo. Ischemic cardiomyopathy based on cath. No ACS during admission (HS-TnI in the 100s  range with no trend). RHC w/ low CI, 1.98, and required milrinone for RV support and to aid in diuresis. Weaned off milrinone. He was admitted on 6/4 again with volume overload.  Still has volume overload on exam, no dyspnea at rest.  -Continue Lasix 80 mg IV bid, follow response today.     - Continue digoxin 0.125 daily, check level in am.  - Continue spironolactone to 25 mg daily.  - Stop losartan and start Entresto 24-26 mg twice a day tomorrow morning.  - Continue ivabradine 7.5 mg twice a day  2. CAD: Severe 3VD on cath 2/21. Not a CABG candidate due to poor functional capacity at baseline. S/patherectomy/stenting left main into LAD and PTCA of ramus on 03/22/19. DFR of RCA and PL is 0.91, confirming  nonhemodynamically significant lesions (plan medical therapy). No further chest pain, doubt ACS this admission. Mild HS-troponin elevation without trend, suspect demand ischemia due to volume overload/CHF.  - Continue DAPT w/ ASA/Plavix indefinitely  - ContinueCrestor 20, (lipids at goal, LDL 62).  3. H/o CVA: Residual left-sided weakness as well as some cognitive dysfunction per notes.  - He continues on Plavix.  - Continue statin.  4. PAD: Moderate RLE vascular disease by ABIs, left normal. Denies claudication - continue medical therapy, ASA/Plavix + statin  Length of Stay: 1  Loralie Champagne, MD  07/14/2019, 11:08 AM  Advanced Heart Failure Team Pager [phone] (M-F; 7a - 4p)  Please contact [facility] for night-coverage after hours (4p -7a ) and weekends on [email or web address]

## 2019-07-15 LAB — BASIC METABOLIC PANEL
Anion gap: 9 (ref 5–15)
BUN: 13 mg/dL (ref 8–23)
CO2: 25 mmol/L (ref 22–32)
Calcium: 8.8 mg/dL — ABNORMAL LOW (ref 8.9–10.3)
Chloride: 99 mmol/L (ref 98–111)
Creatinine, Ser: 1.05 mg/dL (ref 0.61–1.24)
GFR calc Af Amer: >60 mL/min (ref >60)
GFR calc non Af Amer: >60 mL/min (ref >60)
Glucose, Bld: 90 mg/dL (ref 70–99)
Potassium: 3.8 mmol/L (ref 3.5–5.1)
Sodium: 133 mmol/L — ABNORMAL LOW (ref 135–145)

## 2019-07-15 LAB — GLUCOSE, CAPILLARY
Glucose-Capillary: 94 mg/dL (ref 70–99)
Glucose-Capillary: 117 mg/dL — ABNORMAL HIGH (ref 70–99)
Glucose-Capillary: 149 mg/dL — ABNORMAL HIGH (ref 70–99)
Glucose-Capillary: 178 mg/dL — ABNORMAL HIGH (ref 70–99)

## 2019-07-15 LAB — DIGOXIN LEVEL: Digoxin Level: 0.6 ng/mL — ABNORMAL LOW (ref 0.8–2.0)

## 2019-07-15 MED ORDER — POTASSIUM CHLORIDE CRYS ER 20 MEQ PO TBCR
40.0000 meq | EXTENDED_RELEASE_TABLET | Freq: Once | ORAL | Status: AC
  Administered 2019-07-15: 40 meq via ORAL
  Filled 2019-07-15: qty 2

## 2019-07-15 NOTE — Progress Notes (Signed)
Patient ID: KARLTON MAYA, male   DOB: Jun 21, 1955, 64 y.o.   MRN: [id number]     Advanced Heart Failure Rounding Note  PCP-Cardiologist: Prentice Docker, MD   Subjective:    Walked around room with walker yesterday.  Diuresed well, weight down 4 lbs.  Thinks breathing is getting better.   - CTA chest: No PE, small effusions - Echo: EF 20-25%, normal RV, PASP 56 mmHg, dilated IVC   Objective:   Weight Range: 91.8 kg Body mass index is 29.04 kg/m.   Vital Signs:   Temp:  [97.7 F (36.5 C)-98.6 F (37 C)] 97.8 F (36.6 C) (06/06 0915) Pulse Rate:  [80-100] 97 (06/06 0915) Resp:  [15-20] 20 (06/06 0915) BP: (82-106)/(51-75) 106/75 (06/06 0915) SpO2:  [95 %-100 %] 100 % (06/06 0915) Weight:  [91.8 kg] 91.8 kg (06/06 0402) Last BM Date: 07/13/19  Weight change: Filed Weights   07/14/19 0133 07/15/19 0402  Weight: 93.4 kg 91.8 kg    Intake/Output:   Intake/Output Summary (Last 24 hours) at 07/15/2019 1012 Last data filed at 07/15/2019 0931 Gross per 24 hour  Intake 1200 ml  Output 4475 ml  Net -3275 ml      Physical Exam    General: NAD Neck: JVP 10-12 cm, no thyromegaly or thyroid nodule.  Lungs: Clear to auscultation bilaterally with normal respiratory effort. CV: Nondisplaced PMI.  Heart regular S1/S2, no S3/S4, no murmur.  Trace ankle edema.   Abdomen: Soft, nontender, no hepatosplenomegaly, no distention.  Skin: Intact without lesions or rashes.  Neurologic: Alert and oriented x 3.  Psych: Normal affect. Extremities: No clubbing or cyanosis.  HEENT: Normal.    Telemetry   NSR 80s (personally reviewed)  Labs    CBC Recent Labs    07/13/19 0939 07/13/19 1928  WBC 11.7* 9.9  HGB 13.1 12.6*  HCT 41.2 38.2*  MCV 97.4 93.4  PLT 334 317   Basic Metabolic Panel Recent Labs    40/98/11 0654 07/15/19 0521  NA 134* 133*  K 3.7 3.8  CL 100 99  CO2 25 25  GLUCOSE 77 90  BUN 13 13  CREATININE 0.77 1.05  CALCIUM 8.7* 8.8*   Liver Function  Tests Recent Labs    07/13/19 1928  AST 42*  ALT 20  ALKPHOS 82  BILITOT 1.8*  PROT 6.7  ALBUMIN 3.4*   No results for input(s): LIPASE, AMYLASE in the last 72 hours. Cardiac Enzymes No results for input(s): CKTOTAL, CKMB, CKMBINDEX, TROPONINI in the last 72 hours.  BNP: BNP (last 3 results) Recent Labs    03/14/19 1832 07/13/19 0939  BNP 1,338.6* 1,733.6*    ProBNP (last 3 results) No results for input(s): PROBNP in the last 8760 hours.   D-Dimer No results for input(s): DDIMER in the last 72 hours. Hemoglobin A1C Recent Labs    07/13/19 1928  HGBA1C 6.7*   Fasting Lipid Panel No results for input(s): CHOL, HDL, LDLCALC, TRIG, CHOLHDL, LDLDIRECT in the last 72 hours. Thyroid Function Tests Recent Labs    07/13/19 1928  TSH 1.374    Other results:   Imaging    ECHOCARDIOGRAM COMPLETE  Result Date: 07/14/2019    ECHOCARDIOGRAM REPORT   Patient Name:   AZEKIEL CREMER Date of Exam: 07/14/2019 Medical Rec #:  [id number]      Height:       72.0 in Accession #:    [id number]     Weight:       206.0  lb Date of Birth:  1955-02-13       BSA:          2.157 m Patient Age:    63 years       BP:           104/72 mmHg Patient Gender: M              HR:           81 bpm. Exam Location:  Inpatient Procedure: 2D Echo, Cardiac Doppler, Color Doppler and Intracardiac            Opacification Agent Indications:    I50.31 Acute diastolic (congestive) heart failure  History:        Patient has prior history of Echocardiogram examinations, most                 recent 03/14/2019.  Sonographer:    [name] RDCS Referring Phys: [id number] Arturo Freundlich S Muna Demers IMPRESSIONS  1. Left ventricular ejection fraction, by estimation, is 20 to 25%. The left ventricle has severely decreased function. The left ventricle demonstrates global hypokinesis. Left ventricular diastolic parameters are indeterminate.  2. Right ventricular systolic function is normal. The right ventricular size is mildly enlarged. There is  moderately elevated pulmonary artery systolic pressure. The estimated right ventricular systolic pressure is 56.0 mmHg.  3. Left atrial size was moderately dilated.  4. Right atrial size was moderately dilated.  5. The mitral valve is normal in structure. Moderate mitral valve regurgitation. No evidence of mitral stenosis.  6. Tricuspid valve regurgitation is moderate.  7. The aortic valve is normal in structure. Aortic valve regurgitation is not visualized. No aortic stenosis is present.  8. The inferior vena cava is dilated in size with <50% respiratory variability, suggesting right atrial pressure of 15 mmHg. Comparison(s): No significant change from prior study. Prior images reviewed side by side. FINDINGS  Left Ventricle: Left ventricular ejection fraction, by estimation, is 20 to 25%. The left ventricle has severely decreased function. The left ventricle demonstrates global hypokinesis. Definity contrast agent was given IV to delineate the left ventricular endocardial borders. The left ventricular internal cavity size was normal in size. There is no left ventricular hypertrophy. Left ventricular diastolic parameters are indeterminate. Right Ventricle: The right ventricular size is mildly enlarged. No increase in right ventricular wall thickness. Right ventricular systolic function is normal. There is moderately elevated pulmonary artery systolic pressure. The tricuspid regurgitant velocity is 3.39 m/s, and with an assumed right atrial pressure of 10 mmHg, the estimated right ventricular systolic pressure is 56.0 mmHg. Left Atrium: Left atrial size was moderately dilated. Right Atrium: Right atrial size was moderately dilated. Pericardium: There is no evidence of pericardial effusion. Mitral Valve: The mitral valve is normal in structure. Normal mobility of the mitral valve leaflets. Moderate mitral valve regurgitation. No evidence of mitral valve stenosis. Tricuspid Valve: The tricuspid valve is normal in  structure. Tricuspid valve regurgitation is moderate . No evidence of tricuspid stenosis. Aortic Valve: The aortic valve is normal in structure. Aortic valve regurgitation is not visualized. No aortic stenosis is present. Pulmonic Valve: The pulmonic valve was normal in structure. Pulmonic valve regurgitation is not visualized. No evidence of pulmonic stenosis. Aorta: The aortic root is normal in size and structure. Venous: The inferior vena cava is dilated in size with less than 50% respiratory variability, suggesting right atrial pressure of 15 mmHg. IAS/Shunts: No atrial level shunt detected by color flow Doppler.  LEFT VENTRICLE PLAX 2D  LVIDd:         5.70 cm      Diastology LVIDs:         5.00 cm      LV e' lateral:   8.03 cm/s LV PW:         1.00 cm      LV E/e' lateral: 14.2 LV IVS:        0.90 cm      LV e' medial:    3.75 cm/s LVOT diam:     2.00 cm      LV E/e' medial:  30.4 LV SV:         18 LV SV Index:   8 LVOT Area:     3.14 cm  LV Volumes (MOD) LV vol d, MOD A2C: 162.0 ml LV vol d, MOD A4C: 206.0 ml LV vol s, MOD A2C: 126.0 ml LV vol s, MOD A4C: 164.0 ml LV SV MOD A2C:     36.0 ml LV SV MOD A4C:     206.0 ml LV SV MOD BP:      39.6 ml RIGHT VENTRICLE             IVC RV Basal diam:  4.90 cm     IVC diam: 2.30 cm RV Mid diam:    3.70 cm RV S prime:     10.20 cm/s TAPSE (M-mode): 0.9 cm LEFT ATRIUM              Index       RIGHT ATRIUM           Index LA diam:        5.30 cm  2.46 cm/m  RA Area:     19.20 cm LA Vol (A2C):   124.0 ml 57.47 ml/m RA Volume:   57.30 ml  26.56 ml/m LA Vol (A4C):   92.3 ml  42.78 ml/m LA Biplane Vol: 108.0 ml 50.06 ml/m  AORTIC VALVE LVOT Vmax:   40.40 cm/s LVOT Vmean:  27.200 cm/s LVOT VTI:    0.057 m  AORTA Ao Root diam: 3.20 cm Ao Asc diam:  3.10 cm MITRAL VALVE                 TRICUSPID VALVE MV Area (PHT): 7.44 cm      TR Peak grad:   46.0 mmHg MV Decel Time: 102 msec      TR Vmax:        339.00 cm/s MR Peak grad:    66.1 mmHg MR Mean grad:    39.5 mmHg   SHUNTS  MR Vmax:         406.50 cm/s Systemic VTI:  0.06 m MR Vmean:        290.0 cm/s  Systemic Diam: 2.00 cm MR PISA:         1.57 cm MR PISA Eff ROA: 12 mm MR PISA Radius:  0.50 cm MV E velocity: 114.00 cm/s MV A velocity: 31.50 cm/s MV E/A ratio:  3.62 Donato Schultz MD Electronically signed by Donato Schultz MD Signature Date/Time: 07/14/2019/1:22:27 PM    Final      Medications:     Scheduled Medications: . aspirin EC  81 mg Oral Daily  . atorvastatin  40 mg Oral Daily  . clopidogrel  75 mg Oral Daily  . digoxin  0.125 mg Oral Daily  . empagliflozin  10 mg Oral Daily  . enoxaparin (LOVENOX) injection  40 mg Subcutaneous Q24H  . furosemide  80 mg Intravenous BID  . insulin detemir  10 Units Subcutaneous BID  . ivabradine  7.5 mg Oral BID WC  . potassium chloride  40 mEq Oral Once  . sacubitril-valsartan  1 tablet Oral BID  . sodium chloride flush  3 mL Intravenous Once  . sodium chloride flush  3 mL Intravenous Q12H  . spironolactone  25 mg Oral Daily    Infusions: . sodium chloride      PRN Medications: sodium chloride, acetaminophen, ALPRAZolam, nitroGLYCERIN, ondansetron (ZOFRAN) IV, sodium chloride flush, zolpidem    Assessment/Plan   1. Acute on chronic systolic CHF: Echo in 7341 with EF 30-35%. Admission to [facility] 2/21 for a/c CHF w/ low output. ECHO 03/14/19  EF down to 10-15% with severe RV dysfunction on echo. Ischemic cardiomyopathy based on cath. No ACS during admission (HS-TnI in the 100s range with no trend). RHC w/ low CI, 1.98, and required milrinone for RV support and to aid in diuresis. Weaned off milrinone. He was admitted on 6/4 again with volume overload.  Echo this admission with EF 20-25%, normal RV, IVC dilated.  He diuresed well yesterday, weight down 4 lbs.  Still with mild volume overload on exam.  -Continue Lasix 80 mg IV bid, follow response today.     - Continue digoxin 0.125 daily, level ok at 0.6 today.  - Continue spironolactone to 25 mg daily.  - Continue  Entresto 24-26 mg twice a day.  - Continue ivabradine 7.5 mg twice a day  - He will need eventual ICD placement with persistently low EF, not CRT candidate.  2. CAD: Severe 3VD on cath 2/21. Not a CABG candidate due to poor functional capacity at baseline. S/patherectomy/stenting left main into LAD and PTCA of ramus on 03/22/19. DFR of RCA and PL is 0.91, confirming nonhemodynamically significant lesions (plan medical therapy). No further chest pain, doubt ACS this admission. Mild HS-troponin elevation without trend, suspect demand ischemia due to volume overload/CHF.  - Continue DAPT w/ ASA/Plavix indefinitely  - ContinueCrestor 20, (lipids at goal, LDL 62).  3. H/o CVA: Residual left-sided weakness as well as some cognitive dysfunction per notes.  - He continues on Plavix.  - Continue statin.  4. PAD: Moderate RLE vascular disease by ABIs, left normal. Denies claudication - continue medical therapy, ASA/Plavix + statin  Length of Stay: 2  Loralie Champagne, MD  07/15/2019, 10:12 AM  Advanced Heart Failure Team Pager [phone] (M-F; 7a - 4p)  Please contact [facility] for night-coverage after hours (4p -7a ) and weekends on [email or web address]

## 2019-07-15 NOTE — Evaluation (Signed)
Physical Therapy Evaluation Patient Details Name: ABDUR Mccormick MRN: [id number] DOB: [birth year] Today's Date: 07/15/2019   History of Present Illness  Matthew Mccormick is a 64 y.o. male with a hx of HTN, hyperlipidemia, DMII, CVA with left hemiparesis, 2018 ECHO at [facility] with EF 30-35% (normal RV), EF <15% echo 03/2019 w/ CGS, admitted for SOB, weakness.  He had been planning to be seen for cyst on L arm, but reports couldn't make it in due to SOB. Admitted for CHF exacerbation.  Clinical Impression  Patient presents with decreased balance and decreased activity tolerance with generalized weakness limiting safety and independence with mobility.  Reports relatively recent fall at home and feel he will benefit from skilled PT in the acute setting.  Reports his aide trying to get HHPT set up so feel he will benefit from PT follow up at d/c.      Follow Up Recommendations Home health PT    Equipment Recommendations  None recommended by PT    Recommendations for Other Services       Precautions / Restrictions Precautions Precautions: Fall Precaution Comments: fell some weeks ago at home between [place] and bed, denies injury      Mobility  Bed Mobility Overal bed mobility: Modified Independent                Transfers Overall transfer level: Modified independent Equipment used: Rolling walker (2 wheeled)             General transfer comment: up from EOB prior to getting gown on his back unaided  Ambulation/Gait Ambulation/Gait assistance: Supervision Gait Distance (Feet): 180 Feet Assistive device: Rolling walker (2 wheeled) Gait Pattern/deviations: Step-through pattern;Decreased stride length     General Gait Details: denies SOB with ambulation on RA, HR max 107  Stairs            Wheelchair Mobility    Modified Rankin (Stroke Patients Only)       Balance Overall balance assessment: Needs assistance   Sitting balance-Leahy Scale: Good     Standing  balance support: No upper extremity supported Standing balance-Leahy Scale: Fair Standing balance comment: static balance without UE support, needs RW for ambulation today                             Pertinent Vitals/Pain Pain Assessment: No/denies pain    Home Living Family/patient expects to be discharged to:: Private residence Living Arrangements: Alone Available Help at Discharge: Personal care attendant Type of Home: Apartment Home Access: Ramped entrance     Home Layout: One level Home Equipment: Cane - single point;Shower seat;Wheelchair - manual;Bedside commode;Walker - 2 wheels Additional Comments: has aide 6 days a week 3 hours a day    Prior Function Level of Independence: Needs assistance   Gait / Transfers Assistance Needed: independent with cane  ADL's / Homemaking Assistance Needed: assist for BADL from aide 6 days a week (states the come during the day only) and assist with bathing, dressing, IADLs        Hand Dominance        Extremity/Trunk Assessment   Upper Extremity Assessment Upper Extremity Assessment: Overall WFL for tasks assessed    Lower Extremity Assessment Lower Extremity Assessment: Generalized weakness    Cervical / Trunk Assessment Cervical / Trunk Assessment: Kyphotic  Communication   Communication: No difficulties  Cognition Arousal/Alertness: Awake/alert Behavior During Therapy: WFL for tasks assessed/performed Overall Cognitive Status: No  family/caregiver present to determine baseline cognitive functioning                                 General Comments: likely at baseline with some cognitive deficits (reports does not use any cooking items at home, when aide not there brother gets him food)      General Comments      Exercises     Assessment/Plan    PT Assessment Patient needs continued PT services  PT Problem List Decreased balance;Decreased knowledge of use of DME;Decreased  mobility;Decreased safety awareness       PT Treatment Interventions DME instruction;Therapeutic activities;Gait training;Balance training;Functional mobility training    PT Goals (Current goals can be found in the Care Plan section)  Acute Rehab PT Goals Patient Stated Goal: to not fall PT Goal Formulation: With patient Time For Goal Achievement: 07/29/19 Potential to Achieve Goals: Good    Frequency Min 3X/week   Barriers to discharge        Co-evaluation               AM-PAC PT "6 Clicks" Mobility  Outcome Measure Help needed turning from your back to your side while in a flat bed without using bedrails?: None Help needed moving from lying on your back to sitting on the side of a flat bed without using bedrails?: None Help needed moving to and from a bed to a chair (including a wheelchair)?: None Help needed standing up from a chair using your arms (e.g., wheelchair or bedside chair)?: None Help needed to walk in hospital room?: None Help needed climbing 3-5 steps with a railing? : A Little 6 Click Score: 23    End of Session   Activity Tolerance: Patient tolerated treatment well Patient left: in bed;with bed alarm set;with call bell/phone within reach   PT Visit Diagnosis: Muscle weakness (generalized) (M62.81);History of falling (Z91.81)    Time: 3151-7616 PT Time Calculation (min) (ACUTE ONLY): 18 min   Charges:   PT Evaluation $PT Eval Moderate Complexity: [place], [place] Acute Rehabilitation Services [phone] 07/15/2019   Matthew Mccormick 07/15/2019, 2:29 PM

## 2019-07-16 LAB — BASIC METABOLIC PANEL
Anion gap: 14 (ref 5–15)
BUN: 14 mg/dL (ref 8–23)
CO2: 26 mmol/L (ref 22–32)
Calcium: 9.2 mg/dL (ref 8.9–10.3)
Chloride: 97 mmol/L — ABNORMAL LOW (ref 98–111)
Creatinine, Ser: 1.09 mg/dL (ref 0.61–1.24)
GFR calc Af Amer: >60 mL/min (ref >60)
GFR calc non Af Amer: >60 mL/min (ref >60)
Glucose, Bld: 100 mg/dL — ABNORMAL HIGH (ref 70–99)
Potassium: 3.7 mmol/L (ref 3.5–5.1)
Sodium: 137 mmol/L (ref 135–145)

## 2019-07-16 LAB — GLUCOSE, CAPILLARY
Glucose-Capillary: 109 mg/dL — ABNORMAL HIGH (ref 70–99)
Glucose-Capillary: 134 mg/dL — ABNORMAL HIGH (ref 70–99)
Glucose-Capillary: 147 mg/dL — ABNORMAL HIGH (ref 70–99)
Glucose-Capillary: 206 mg/dL — ABNORMAL HIGH (ref 70–99)

## 2019-07-16 MED ORDER — TORSEMIDE 20 MG PO TABS
40.0000 mg | ORAL_TABLET | Freq: Two times a day (BID) | ORAL | Status: DC
  Administered 2019-07-16: 40 mg via ORAL
  Filled 2019-07-16 (×2): qty 2

## 2019-07-16 NOTE — Progress Notes (Addendum)
Patient ID: Matthew Mccormick, male   DOB: Jan 17, 1956, 64 y.o.   MRN: [id number]     Advanced Heart Failure Rounding Note  PCP-Cardiologist: Kate Sable, MD   Subjective:    Continues to diurese well w/ IV Lasix. -3.7L in UOP yesterday. Overall, net negative 7.2L. Wt down 6 lb. SCr and K stable.   Feels well today. Eating breakfast. Apetite is good. Denies dyspnea and CP. No supp O2 requirements.   - CTA chest: No PE, small effusions - Echo: EF 20-25%, normal RV, PASP 56 mmHg, dilated IVC   Objective:   Weight Range: 91 kg Body mass index is 28.78 kg/m.   Vital Signs:   Temp:  [97.5 F (36.4 C)-98.6 F (37 C)] 97.5 F (36.4 C) (06/07 0757) Pulse Rate:  [79-97] 79 (06/07 0757) Resp:  [15-20] 15 (06/07 0757) BP: (84-108)/(55-75) 84/61 (06/07 0757) SpO2:  [97 %-100 %] 99 % (06/07 0757) Weight:  [91 kg] 91 kg (06/07 0253) Last BM Date: 07/13/19  Weight change: Filed Weights   07/14/19 0133 07/15/19 0402 07/16/19 0253  Weight: 93.4 kg 91.8 kg 91 kg    Intake/Output:   Intake/Output Summary (Last 24 hours) at 07/16/2019 0759 Last data filed at 07/16/2019 0609 Gross per 24 hour  Intake 920 ml  Output 3700 ml  Net -2780 ml      Physical Exam    PHYSICAL EXAM: General:  Chronically ill appearing WM. No respiratory difficulty HEENT: normal Neck: supple. no JVD. Carotids 2+ bilat; no bruits. No lymphadenopathy or thyromegaly appreciated. Cor: PMI nondisplaced. Regular rate & rhythm. No rubs, gallops or murmurs. Lungs: mid Left sided crackles that clear w/ cough c/w atelectasis  Abdomen: soft, nontender, nondistended. No hepatosplenomegaly. No bruits or masses. Good bowel sounds. Extremities: no cyanosis, clubbing, rash, trace bilateral  ankle edema Neuro: alert & oriented x 3, cranial nerves grossly intact. moves all 4 extremities w/o difficulty. Affect pleasant.   Telemetry   NSR 80s (personally reviewed)  Labs    CBC Recent Labs    07/13/19 0939  07/13/19 1928  WBC 11.7* 9.9  HGB 13.1 12.6*  HCT 41.2 38.2*  MCV 97.4 93.4  PLT 334 409   Basic Metabolic Panel Recent Labs    07/15/19 0521 07/16/19 0611  NA 133* 137  K 3.8 3.7  CL 99 97*  CO2 25 26  GLUCOSE 90 100*  BUN 13 14  CREATININE 1.05 1.09  CALCIUM 8.8* 9.2   Liver Function Tests Recent Labs    07/13/19 1928  AST 42*  ALT 20  ALKPHOS 82  BILITOT 1.8*  PROT 6.7  ALBUMIN 3.4*   No results for input(s): LIPASE, AMYLASE in the last 72 hours. Cardiac Enzymes No results for input(s): CKTOTAL, CKMB, CKMBINDEX, TROPONINI in the last 72 hours.  BNP: BNP (last 3 results) Recent Labs    03/14/19 1832 07/13/19 0939  BNP 1,338.6* 1,733.6*    ProBNP (last 3 results) No results for input(s): PROBNP in the last 8760 hours.   D-Dimer No results for input(s): DDIMER in the last 72 hours. Hemoglobin A1C Recent Labs    07/13/19 1928  HGBA1C 6.7*   Fasting Lipid Panel No results for input(s): CHOL, HDL, LDLCALC, TRIG, CHOLHDL, LDLDIRECT in the last 72 hours. Thyroid Function Tests Recent Labs    07/13/19 1928  TSH 1.374    Other results:   Imaging    No results found.   Medications:     Scheduled Medications: . aspirin EC  81 mg Oral Daily  . atorvastatin  40 mg Oral Daily  . clopidogrel  75 mg Oral Daily  . digoxin  0.125 mg Oral Daily  . empagliflozin  10 mg Oral Daily  . enoxaparin (LOVENOX) injection  40 mg Subcutaneous Q24H  . furosemide  80 mg Intravenous BID  . insulin detemir  10 Units Subcutaneous BID  . ivabradine  7.5 mg Oral BID WC  . sacubitril-valsartan  1 tablet Oral BID  . sodium chloride flush  3 mL Intravenous Once  . sodium chloride flush  3 mL Intravenous Q12H  . spironolactone  25 mg Oral Daily    Infusions: . sodium chloride      PRN Medications: sodium chloride, acetaminophen, ALPRAZolam, nitroGLYCERIN, ondansetron (ZOFRAN) IV, sodium chloride flush, zolpidem    Assessment/Plan   1. Acute on  chronic systolic CHF: Echo in 2018 with EF 30-35%. Admission to [facility] 2/21 for a/c CHF w/ low output. ECHO 03/14/19  EF down to 10-15% with severe RV dysfunction on echo. Ischemic cardiomyopathy based on cath. No ACS during admission (HS-TnI in the 100s range with no trend). RHC w/ low CI, 1.98, and required milrinone for RV support and to aid in diuresis. Weaned off milrinone. He was admitted on 6/4 again with volume overload.  Echo this admission with EF 20-25%, normal RV, IVC dilated.  He diuresed well yesterday. Overall wt down 6 lb. Appears close to euvolemic.  - Plan to transition back to PO diuretics today.  - Continue digoxin 0.125 daily, level ok at 0.6 (checked 6/6)  - Continue spironolactone 25 mg daily.  - Continue Entresto 24-26 mg twice a day. BP too soft for titration.   - Continue ivabradine 7.5 mg twice a day  - He will need eventual ICD placement with persistently low EF, not CRT candidate.  2. CAD: Severe 3VD on cath 2/21. Not a CABG candidate due to poor functional capacity at baseline. S/patherectomy/stenting left main into LAD and PTCA of ramus on 03/22/19. DFR of RCA and PL is 0.91, confirming nonhemodynamically significant lesions (plan medical therapy). No further chest pain, doubt ACS this admission. Mild HS-troponin elevation without trend, suspect demand ischemia due to volume overload/CHF.  - Continue DAPT w/ ASA/Plavix indefinitely  - ContinueCrestor 20, (lipids at goal, LDL 62).  3. H/o CVA: Residual left-sided weakness as well as some cognitive dysfunction per notes.  - He continues on Plavix.  - Continue statin.  4. PAD: Moderate RLE vascular disease by ABIs, left normal. Denies claudication - continue medical therapy, ASA/Plavix + statin  Length of Stay: [address], PA-C  07/16/2019, 7:59 AM  Advanced Heart Failure Team Pager [phone] (M-F; 7a - 4p)  Please contact [facility] for night-coverage after hours (4p -7a ) and weekends on  [email or web address]  Patient seen with PA, agree with the above note.    He is stable today, has diuresed well and feels like breathing is back to normal.  Creatinine stable.   General: NAD Neck: No JVD, no thyromegaly or thyroid nodule.  Lungs: Clear to auscultation bilaterally with normal respiratory effort. CV: Nondisplaced PMI.  Heart regular S1/S2, no S3/S4, no murmur.  No peripheral edema.   Abdomen: Soft, nontender, no hepatosplenomegaly, no distention.  Skin: Intact without lesions or rashes.  Neurologic: Alert and oriented x 3.  Psych: Normal affect. Extremities: No clubbing or cyanosis.  HEENT: Normal.   No BP room to titrate Entresto today.  I will stop IV Lasix and start  torsemide 40 mg bid.   With persistently low EF, he will need an ICD. Not CRT candidate.  Given difficulty getting him back and forth from [place] (does not drive), will see if we can place ICD while he is in the hospital. Will consult EP.   Marca Ancona 07/16/2019 11:48 AM

## 2019-07-16 NOTE — Progress Notes (Signed)
CARDIAC REHAB PHASE I   PRE:  Rate/Rhythm: 74 SR  BP:  Supine:   Sitting: 98/57  Standing:    SaO2: 99%RA  MODE:  Ambulation: 300 ft   POST:  Rate/Rhythm: 96  BP:  Supine:   Sitting: 88/64, 92/58  Standing:    SaO2: 99%RA 1311-1332 Pt agreeable to walk again. Pt walked 300 ft on RA with gait belt use, rolling walker and asst x 1. Gait fairly steady. To recliner after walk with call bell. Tolerated well. BP low but denied dizziness.   Luetta Nutting, RN BSN  07/16/2019 1:30 PM

## 2019-07-16 NOTE — Progress Notes (Signed)
  Discussed with Dr. Elberta Fortis, will see patient as an outpatient to discuss ICD for primary prevention. Pt and family prefer follow up in [place] if possible (Pt doesn't drive and has a caregiver that can take him to appointments when his POA is out of town).   Have made follow up with Dr. Johney Frame on 7/2 after discussions with POA and patient.   Casimiro Needle [address] [place], PA-C  07/16/2019 2:52 PM

## 2019-07-16 NOTE — Progress Notes (Signed)
Patient B/p 79/49 with recheck. Pt asymptomatic. MD Akther notified and ok to hold [facility].   RN will continue to monitor.

## 2019-07-16 NOTE — Progress Notes (Signed)
[id number] Came to see pt to walk. Pt stated he just walked (with Mobility team). Asked pt if he weighs every day and he stated yes and that it was written down every day. Asked him what he does if he has swollen feet or belly and he stated NT knows who to call. Stated he eats what NT fixes M-Sat but usually eats take out on Sundays as he is not allowed to cook. Will continue to follow pt. Luetta Nutting RN BSN 07/16/2019 10:48 AM

## 2019-07-16 NOTE — Care Management Important Message (Signed)
Important Message  Patient Details  Name: Matthew Mccormick MRN: [id number] Date of Birth: 06/11/1955   Medicare Important Message Given:  Yes     Renie Ora 07/16/2019, 3:13 PM

## 2019-07-16 NOTE — Consult Note (Signed)
   [facility] Inpatient Consult   07/16/2019  Matthew Mccormick April 05, 1955 [id number]   Patient is currently active with [facility] [[facility] Care Management for chronic disease management services.  Patient has been engaged by a [employer].  Our community based plan of care has focused on disease management and community resource support [please see notes for details].    Plan: Will follow up with  Inpatient Transition Of Care [TOC] team member to make aware that [facility] Care Management following.   Of note, [facility] Care Management services does not replace or interfere with any services that are needed or arranged by inpatient [facility] care management team.  For additional questions or referrals please contact:  Charlesetta Shanks, RN BSN CCM [facility]  [phone] business mobile phone Toll free office [phone]  Fax number: [phone] [email or web address]

## 2019-07-17 ENCOUNTER — Telehealth (HOSPITAL_COMMUNITY): Payer: Self-pay | Admitting: Pharmacy Technician

## 2019-07-17 LAB — BASIC METABOLIC PANEL
Anion gap: 11 (ref 5–15)
BUN: 14 mg/dL (ref 8–23)
CO2: 23 mmol/L (ref 22–32)
Calcium: 8.7 mg/dL — ABNORMAL LOW (ref 8.9–10.3)
Chloride: 103 mmol/L (ref 98–111)
Creatinine, Ser: 0.76 mg/dL (ref 0.61–1.24)
GFR calc Af Amer: >60 mL/min (ref >60)
GFR calc non Af Amer: >60 mL/min (ref >60)
Glucose, Bld: 95 mg/dL (ref 70–99)
Potassium: 3.8 mmol/L (ref 3.5–5.1)
Sodium: 137 mmol/L (ref 135–145)

## 2019-07-17 LAB — GLUCOSE, CAPILLARY
Glucose-Capillary: 92 mg/dL (ref 70–99)
Glucose-Capillary: 139 mg/dL — ABNORMAL HIGH (ref 70–99)
Glucose-Capillary: 208 mg/dL — ABNORMAL HIGH (ref 70–99)
Glucose-Capillary: 190 mg/dL — ABNORMAL HIGH (ref 70–99)

## 2019-07-17 NOTE — Progress Notes (Signed)
Pt SBP is in the 70's pt  AOx4 but  falls asleep quickly. CHF Team paged. Awaiting to call back.,

## 2019-07-17 NOTE — Plan of Care (Signed)
  Problem: Education: Goal: Knowledge of General Education information will improve Description: Including pain rating scale, medication(s)/side effects and non-pharmacologic comfort measures Outcome: Progressing   Problem: Health Behavior/Discharge Planning: Goal: Ability to manage health-related needs will improve Outcome: Progressing   Problem: Clinical Measurements: Goal: Ability to maintain clinical measurements within normal limits will improve Outcome: Progressing   Problem: Activity: Goal: Risk for activity intolerance will decrease Outcome: Progressing   Problem: Elimination: Goal: Will not experience complications related to urinary retention Outcome: Progressing   Problem: Safety: Goal: Ability to remain free from injury will improve Outcome: Progressing   Problem: Education: Goal: Ability to demonstrate management of disease process will improve Outcome: Progressing Goal: Ability to verbalize understanding of medication therapies will improve Outcome: Progressing Goal: Individualized Educational Video(s) Outcome: Progressing   Problem: Activity: Goal: Capacity to carry out activities will improve Outcome: Progressing   Problem: Cardiac: Goal: Ability to achieve and maintain adequate cardiopulmonary perfusion will improve Outcome: Progressing

## 2019-07-17 NOTE — Discharge Summary (Signed)
Advanced Heart Failure Team  Discharge Summary   Patient ID: Matthew Mccormick MRN: [id number], DOB/AGE: 64-Oct-1957 64 y.o. Admit date: 07/13/2019 D/C date:     07/18/2019   Primary Discharge Diagnoses:  A/C Systolic Heart Failure Chest pain  CAD H/O CVA PAD   Hospital Course:  Matthew Mccormick is a 64 y.o. male with a hx of HTN, hyperlipidemia, DMII, CVAwith left hemiparesis, 2018 ECHO at [facility] with EF30-35% (normal RV), EF <15% echo 03/2019 w/ CGS, who is being seen today for the evaluation of chest pain at the request of Dr Judd Lien.  Admitted with marked volume overload and chest pain. HS Trop mildly elevated with no trend suspect demand ischemia. CXR also suggest increased edema.  Diuresed with IV lasix. He developed hypotension so diuretics and entresto held. As blood pressure improved he was placed back diuretics. He was switched to torsemide 40 mg twice a day.  Keep off entresto with hypotension.  We can consider losartan at his follow up.   EP consulted for ICD. EP plans to follow up as an outpatient. HF team will continue to follow. Refer to [facility] for HHPT. He has an aide 5-6 days a week 8 hours.   1. Acute on chronic systolic CHF: Echo in 2018 with EF 30-35%. Admission to [facility] 2/21 for a/c CHF w/ low output. ECHO 03/14/19 EF down to 10-15% with severe RV dysfunction on echo. Ischemic cardiomyopathy based on cath. No ACS during admission (HS-TnI in the 100s range with no trend). RHC w/ low CI, 1.98, and required milrinone for RV support and to aid in diuresis. Weaned off milrinone. He was admitted on 6/4 again with volume overload.  Echo this admission with EF 20-25%, normal RV, IVC dilated.  - Diuresed with IV lasix.  -Overall wt down 8 lb. Developed hypotension so torsemide was held  24 hours and entresto was stopped.   - He was started back on torsemide 40 mg bid on the day of discharge.   - Continue digoxin 0.125 daily, level ok at 0.6 (checked 6/6)  - Continue spironolactone 25 mg  daily. -Continue ivabradine 7.5 mg twice a day - He will need eventual ICD placement with persistently low EF, not CRT candidate. EP has seen, will plan to do as outpatient. Has appt w/ Dr. Johney Frame 7/2.  2. CAD: Severe 3VD on cath 2/21. Not a CABG candidate due to poor functional capacity at baseline. S/patherectomy/stenting left main into LAD and PTCA of ramus on 03/22/19. DFR of RCA and PL is 0.91, confirming nonhemodynamically significant lesions (plan medical therapy). No further chest pain, doubt ACS this admission. Mild HS-troponin elevation without trend, suspect demand ischemia due to volume overload/CHF.  - Continue DAPT w/ ASA/Plavix indefinitely  - ContinueCrestor 20, (lipids at goal, LDL 62).  3. H/o CVA: Residual left-sided weakness as well as some cognitive dysfunction per notes.  - He continues on Plavix.  - Continue statin.  4. PAD: Moderate RLE vascular disease by ABIs, left normal. Denies claudication - continue medical therapy, ASA/Plavix + statin Discharge Weight:  Discharge Vitals: Blood pressure 93/63, pulse 91, temperature 98 F (36.7 C), temperature source Oral, resp. rate 18, height 5\' 10"  (1.778 m), weight 90 kg, SpO2 99 %.  Labs: Lab Results  Component Value Date   WBC 9.9 07/13/2019   HGB 12.6 (L) 07/13/2019   HCT 38.2 (L) 07/13/2019   MCV 93.4 07/13/2019   PLT 317 07/13/2019    Recent Labs  Lab 07/13/19 1928 07/14/19  5284 07/18/19 0407  NA  --    < > 136  K  --    < > 3.9  CL  --    < > 101  CO2  --    < > 26  BUN  --    < > 15  CREATININE 0.88   < > 0.84  CALCIUM  --    < > 9.0  PROT 6.7  --   --   BILITOT 1.8*  --   --   ALKPHOS 82  --   --   ALT 20  --   --   AST 42*  --   --   GLUCOSE  --    < > 104*   < > = values in this interval not displayed.   Lab Results  Component Value Date   CHOL 97 03/28/2019   HDL 23 (L) 03/28/2019   LDLCALC 62 03/28/2019   TRIG 60 03/28/2019   BNP (last 3 results) Recent Labs    03/14/19 1832  07/13/19 0939  BNP 1,338.6* 1,733.6*    ProBNP (last 3 results) No results for input(s): PROBNP in the last 8760 hours.   Diagnostic Studies/Procedures   No results found.  Discharge Medications   Allergies as of 07/18/2019   No Known Allergies     Medication List    STOP taking these medications   Entresto 24-26 MG Generic drug: sacubitril-valsartan   furosemide 40 MG tablet Commonly known as: LASIX   losartan 25 MG tablet Commonly known as: COZAAR     TAKE these medications   aspirin EC 81 MG tablet Take 81 mg by mouth daily.   atorvastatin 40 MG tablet Commonly known as: LIPITOR Take 40 mg by mouth daily.   clopidogrel 75 MG tablet Commonly known as: PLAVIX Take 75 mg by mouth daily.   digoxin 0.125 MG tablet Commonly known as: LANOXIN Take 1 tablet (0.125 mg total) by mouth daily.   empagliflozin 10 MG Tabs tablet Commonly known as: JARDIANCE Take 10 mg by mouth daily.   ibuprofen 200 MG tablet Commonly known as: ADVIL Take 200 mg by mouth every 6 (six) hours as needed for headache.   insulin detemir 100 UNIT/ML injection Commonly known as: LEVEMIR Inject 0.2 mLs (20 Units total) into the skin 2 (two) times daily. What changed:   how much to take  when to take this  additional instructions   ivabradine 7.5 MG Tabs tablet Commonly known as: CORLANOR Take 1 tablet (7.5 mg total) by mouth 2 (two) times daily with a meal.   metFORMIN 1000 MG tablet Commonly known as: GLUCOPHAGE Take 1,000 mg by mouth 2 (two) times daily with a meal.   spironolactone 25 MG tablet Commonly known as: ALDACTONE Take 1 tablet (25 mg total) by mouth daily.   torsemide 20 MG tablet Commonly known as: Demadex Take 2 tablets (40 mg total) by mouth 2 (two) times daily.            Durable Medical Equipment  (From admission, onward)         Start     Ordered   07/17/19 1603  Heart failure home health orders  (Heart failure home health orders / Face to  face)  Once    Comments: Heart Failure Follow-up Care:  Verify follow-up appointments per Patient Discharge Instructions. Confirm transportation arranged. Reconcile home medications with discharge medication list. Remove discontinued medications from use. Assist patient/caregiver to manage medications using pill  box. Reinforce low sodium food selection Assessments: Vital signs and oxygen saturation at each visit. Assess home environment for safety concerns, caregiver support and availability of low-sodium foods. Consult Education officer, museum, PT/OT, Dietitian, and CNA based on assessments. Perform comprehensive cardiopulmonary assessment. Notify MD for any change in condition or weight gain of 3 pounds in one day or 5 pounds in one week with symptoms. Daily Weights and Symptom Monitoring: Ensure patient has access to scales. Teach patient/caregiver to weigh daily before breakfast and after voiding using same scale and record.    Teach patient/caregiver to track weight and symptoms and when to notify Provider. Activity: Develop individualized activity plan with patient/caregiver.  [place] PT  Question Answer Comment  Heart Failure Follow-up Care Advanced Heart Failure (AHF) Clinic at [phone]   Lab frequency Weekly   Fax lab results to Other see comments   Diet Low Sodium Heart Healthy   Fluid restrictions: 2000 mL Fluid      07/17/19 1602          Disposition   The patient will be discharged in stable condition to home. Discharge Instructions    (HEART FAILURE PATIENTS) Call MD:  Anytime you have any of the following symptoms: 1) 3 pound weight gain in 24 hours or 5 pounds in 1 week 2) shortness of breath, with or without a dry hacking cough 3) swelling in the hands, feet or stomach 4) if you have to sleep on extra pillows at night in order to breathe.   Complete by: As directed    Diet - low sodium heart healthy   Complete by: As directed    Increase activity slowly   Complete by: As  directed      Follow-up Information    Thompson Grayer, MD Follow up on 08/10/2019.   Specialty: Cardiology Why: at 1030 am for ICD consultation Contact information: [place] [address] [phone]        [place] Follow up on 07/24/2019.   Specialty: Cardiology Why: at 1200  Contact information: [address] [id number] [place] [phone]            Duration of Discharge Encounter: Greater than 35 minutes   Signed, Darrick Grinder NP-C  07/18/2019, 9:20 AM

## 2019-07-17 NOTE — Telephone Encounter (Signed)
Advanced Heart Failure Patient Advocate Encounter  Prior Authorization for Sherryll Burger has been approved.    PA# [id number] Effective dates: 02/09/19 through 02/08/20  Patients co-pay is $0.00  Archer Asa, CPhT

## 2019-07-17 NOTE — Progress Notes (Signed)
PT Cancellation Note  Patient Details Name: Matthew Mccormick MRN: [id number] DOB: [birth year]   Cancelled Treatment:    Reason Eval/Treat Not Completed: (P) Medical issues which prohibited therapy RN request hold on PT until BP increases. PT will follow back this afternoon as able.   Mustapha Colson B. Beverely Risen PT, DPT Acute Rehabilitation Services Pager [phone] Office [phone]   [name] 07/17/2019, 12:16 PM

## 2019-07-17 NOTE — Progress Notes (Addendum)
Patient ID: Matthew Mccormick, male   DOB: 1955-04-11, 64 y.o.   MRN: [id number]     Advanced Heart Failure Rounding Note  PCP-Cardiologist: Prentice Docker, MD   Subjective:    Has diuresed well w/ additional 4.5L in UOP yesterday. Net I/Os -10.7L since admit. Wt down an additional 4 lb in last 24 hrs. Overall down 10 lb. Transitioned back to PO Torsemide yesterday. No dyspnea.   17 beats of VT on tele. BMP pending.   BP soft but stable.   Feels well today. No complaints. Reports he ambulated the unit yesterday w/o exertional dyspnea.   - CTA chest: No PE, small effusions - Echo: EF 20-25%, normal RV, PASP 56 mmHg, dilated IVC   Objective:   Weight Range: 89.3 kg Body mass index is 28.24 kg/m.   Vital Signs:   Temp:  [97.3 F (36.3 C)-98.5 F (36.9 C)] 97.6 F (36.4 C) (06/08 0432) Pulse Rate:  [66-84] 77 (06/08 0432) Resp:  [15-20] 20 (06/08 0432) BP: (79-108)/(49-71) 108/71 (06/08 0432) SpO2:  [94 %-100 %] 100 % (06/08 0432) Weight:  [89.3 kg] 89.3 kg (06/08 0432) Last BM Date: 07/13/19  Weight change: Filed Weights   07/15/19 0402 07/16/19 0253 07/17/19 0432  Weight: 91.8 kg 91 kg 89.3 kg    Intake/Output:   Intake/Output Summary (Last 24 hours) at 07/17/2019 0718 Last data filed at 07/17/2019 0439 Gross per 24 hour  Intake 1000 ml  Output 4500 ml  Net -3500 ml      Physical Exam    PHYSICAL EXAM: General:  Obese, chronically ill appearing. No respiratory difficulty HEENT: normal Neck: supple. no JVD. Carotids 2+ bilat; no bruits. No lymphadenopathy or thyromegaly appreciated. Cor: PMI nondisplaced. Regular rate & rhythm. No rubs, gallops or murmurs. Lungs: clear Abdomen: soft, nontender, nondistended. No hepatosplenomegaly. No bruits or masses. Good bowel sounds. Extremities: no cyanosis, clubbing, rash, edema Neuro: alert & oriented x 3, cranial nerves grossly intact. moves all 4 extremities w/o difficulty. Affect pleasant.   Telemetry   NSR 80s,  17 beats of VT (personally reviewed)  Labs    CBC No results for input(s): WBC, NEUTROABS, HGB, HCT, MCV, PLT in the last 72 hours. Basic Metabolic Panel Recent Labs    00/76/22 0521 07/16/19 0611  NA 133* 137  K 3.8 3.7  CL 99 97*  CO2 25 26  GLUCOSE 90 100*  BUN 13 14  CREATININE 1.05 1.09  CALCIUM 8.8* 9.2   Liver Function Tests No results for input(s): AST, ALT, ALKPHOS, BILITOT, PROT, ALBUMIN in the last 72 hours. No results for input(s): LIPASE, AMYLASE in the last 72 hours. Cardiac Enzymes No results for input(s): CKTOTAL, CKMB, CKMBINDEX, TROPONINI in the last 72 hours.  BNP: BNP (last 3 results) Recent Labs    03/14/19 1832 07/13/19 0939  BNP 1,338.6* 1,733.6*    ProBNP (last 3 results) No results for input(s): PROBNP in the last 8760 hours.   D-Dimer No results for input(s): DDIMER in the last 72 hours. Hemoglobin A1C No results for input(s): HGBA1C in the last 72 hours. Fasting Lipid Panel No results for input(s): CHOL, HDL, LDLCALC, TRIG, CHOLHDL, LDLDIRECT in the last 72 hours. Thyroid Function Tests No results for input(s): TSH, T4TOTAL, T3FREE, THYROIDAB in the last 72 hours.  Invalid input(s): FREET3  Other results:   Imaging    No results found.   Medications:     Scheduled Medications: . aspirin EC  81 mg Oral Daily  . atorvastatin  40 mg Oral Daily  . clopidogrel  75 mg Oral Daily  . digoxin  0.125 mg Oral Daily  . empagliflozin  10 mg Oral Daily  . enoxaparin (LOVENOX) injection  40 mg Subcutaneous Q24H  . insulin detemir  10 Units Subcutaneous BID  . ivabradine  7.5 mg Oral BID WC  . sacubitril-valsartan  1 tablet Oral BID  . sodium chloride flush  3 mL Intravenous Once  . sodium chloride flush  3 mL Intravenous Q12H  . spironolactone  25 mg Oral Daily  . torsemide  40 mg Oral BID    Infusions: . sodium chloride      PRN Medications: sodium chloride, acetaminophen, ALPRAZolam, nitroGLYCERIN, ondansetron (ZOFRAN)  IV, sodium chloride flush, zolpidem    Assessment/Plan   1. Acute on chronic systolic CHF: Echo in 2018 with EF 30-35%. Admission to [facility] 2/21 for a/c CHF w/ low output. ECHO 03/14/19  EF down to 10-15% with severe RV dysfunction on echo. Ischemic cardiomyopathy based on cath. No ACS during admission (HS-TnI in the 100s range with no trend). RHC w/ low CI, 1.98, and required milrinone for RV support and to aid in diuresis. Weaned off milrinone. He was admitted on 6/4 again with volume overload.  Echo this admission with EF 20-25%, normal RV, IVC dilated.  He diuresed well yesterday. Overall wt down 10 lb. Now Euvolemic - Continue torsemide 40 mg bid.  - Continue digoxin 0.125 daily, level ok at 0.6 (checked 6/6)  - Continue spironolactone 25 mg daily.  - Continue Entresto 24-26 mg twice a day. BP too soft for titration.   - Continue ivabradine 7.5 mg twice a day  - He will need eventual ICD placement with persistently low EF, not CRT candidate. EP has seen, will plan to do as outpatient. Has appt w/ Dr. Johney Frame 7/2.  2. CAD: Severe 3VD on cath 2/21. Not a CABG candidate due to poor functional capacity at baseline. S/patherectomy/stenting left main into LAD and PTCA of ramus on 03/22/19. DFR of RCA and PL is 0.91, confirming nonhemodynamically significant lesions (plan medical therapy). No further chest pain, doubt ACS this admission. Mild HS-troponin elevation without trend, suspect demand ischemia due to volume overload/CHF.  - Continue DAPT w/ ASA/Plavix indefinitely  - ContinueCrestor 20, (lipids at goal, LDL 62).  3. H/o CVA: Residual left-sided weakness as well as some cognitive dysfunction per notes.  - He continues on Plavix.  - Continue statin.  4. PAD: Moderate RLE vascular disease by ABIs, left normal. Denies claudication - continue medical therapy, ASA/Plavix + statin   Length of Stay: [address], PA-C  07/17/2019, 7:18 AM  Advanced Heart Failure Team Pager [phone]  (M-F; 7a - 4p)  Please contact [facility] for night-coverage after hours (4p -7a ) and weekends on [email or web address]  Patient seen with PA, agree with the above note.    BP lower today though asymptomatic.  I/Os negative again, weight continues to come down.    General: NAD Neck: No JVD, no thyromegaly or thyroid nodule.  Lungs: Clear to auscultation bilaterally with normal respiratory effort. CV: Nondisplaced PMI.  Heart regular S1/S2, no S3/S4, no murmur.  No peripheral edema.   Abdomen: Soft, nontender, no hepatosplenomegaly, no distention.  Skin: Intact without lesions or rashes.  Neurologic: Alert and oriented x 3.  Psych: Normal affect. Extremities: No clubbing or cyanosis.  HEENT: Normal.   Suspect he will not tolerate Entresto due to BP, will stop this.  Likely start losartan  12.5 mg qhs back tomorrow.  Hold torsemide today, start torsemide 40 mg daily tomorrow.   With persistently low EF, he will need an ICD. Not CRT candidate.  EP wants to place ICD as outpatient, he thinks that he can arrange transportation.    Hopefully home in am.   Loralie Champagne 07/17/2019 4:32 PM

## 2019-07-17 NOTE — Progress Notes (Signed)
Physical Therapy Treatment Patient Details Name: Matthew Mccormick MRN: [id number] DOB: October 04, 1955 Today's Date: 07/17/2019    History of Present Illness Matthew Mccormick is a 64 y.o. male with a hx of HTN, hyperlipidemia, DMII, CVA with left hemiparesis, 2018 ECHO at [facility] with EF 30-35% (normal RV), EF <15% echo 03/2019 w/ CGS, admitted for SOB, weakness.  He had been planning to be seen for cyst on L arm, but reports couldn't make it in due to SOB. Admitted for CHF exacerbation.    PT Comments    RN reports pt's BP has improve slightly since this morning and is agreeable to monitor transfer to chair. Pt with no reports of dizziness or lightheadedness. Pt limited in mobility today by decreased BP and increased weakness. Pt requires min A for bed mobility, sit>stand transfer and stepping to recliner. While attempting to collect standing BP pt with increased forward lean requiring modA for lowering to recliner. PT will reassess in next session as to whether d/c plans should be adjusted. PT will continue to follow acutely.  Orthostatic BPs  Supine 81/56  Sitting 100/71  After transfer to chair 105/78  After sitting 3 min 106/67      Follow Up Recommendations  Home health PT     Equipment Recommendations  None recommended by PT       Precautions / Restrictions Precautions Precautions: Fall Restrictions Weight Bearing Restrictions: No    Mobility  Bed Mobility Overal bed mobility: Needs Assistance Bed Mobility: Supine to Sit     Supine to sit: Min assist     General bed mobility comments: min A for supporting trunk as he was coming to upright  Transfers Overall transfer level: Needs assistance Equipment used: 1 person hand held assist Transfers: Sit to/from [employer] Sit to Stand: Min assist Stand pivot transfers: Mod assist       General transfer comment: min A for power up and steadying from the bed, min A for stepping transfer from bed to standing in  front of recliner where attempted to take standing BP, pt became unsteady and needed mod A for sitting back in recliner      Balance Overall balance assessment: Needs assistance   Sitting balance-Leahy Scale: Good     Standing balance support: Bilateral upper extremity supported Standing balance-Leahy Scale: Poor                              Cognition Arousal/Alertness: Awake/alert Behavior During Therapy: WFL for tasks assessed/performed Overall Cognitive Status: No family/caregiver present to determine baseline cognitive functioning                                 General Comments: likely at baseline with some cognitive deficits (reports does not use any cooking items at home, when aide not there brother gets him food)         General Comments General comments (skin integrity, edema, etc.): pt with low BP all day,       Pertinent Vitals/Pain Pain Assessment: No/denies pain           PT Goals (current goals can now be found in the care plan section) Acute Rehab PT Goals Patient Stated Goal: to not fall PT Goal Formulation: With patient Time For Goal Achievement: 07/29/19 Potential to Achieve Goals: Good Progress towards PT goals: PT to reassess next treatment  Frequency    Min 3X/week      PT Plan Other (comment)(PT to reassess in next session )       AM-PAC PT "6 Clicks" Mobility   Outcome Measure  Help needed turning from your back to your side while in a flat bed without using bedrails?: None Help needed moving from lying on your back to sitting on the side of a flat bed without using bedrails?: None Help needed moving to and from a bed to a chair (including a wheelchair)?: None Help needed standing up from a chair using your arms (e.g., wheelchair or bedside chair)?: None Help needed to walk in hospital room?: None Help needed climbing 3-5 steps with a railing? : A Little 6 Click Score: 23    End of Session Equipment  Utilized During Treatment: Gait belt Activity Tolerance: Patient tolerated treatment well Patient left: in bed;with bed alarm set;with call bell/phone within reach Nurse Communication: Mobility status;Other (comment)(BP change) PT Visit Diagnosis: Muscle weakness (generalized) (M62.81);History of falling (Z91.81)     Time: 0174-9449 PT Time Calculation (min) (ACUTE ONLY): 10 min  Charges:  $Therapeutic Activity: 8-22 mins                     Elaysha Bevard B. Migdalia Dk PT, DPT Acute Rehabilitation Services Pager [phone] Office [phone]    [place] 07/17/2019, 5:17 PM

## 2019-07-17 NOTE — Progress Notes (Addendum)
   07/16/19 2243  Assess: MEWS Score  BP (!) 79/49  Pulse Rate 67  Assess: MEWS Score  MEWS Temp 0  MEWS Systolic 2  MEWS Pulse 0  MEWS RR 0  MEWS LOC 0  MEWS Score 2  MEWS Score Color Yellow  Assess: if the MEWS score is Yellow or Red  Were vital signs taken at a resting state? Yes  Focused Assessment Documented focused assessment  Early Detection of Sepsis Score *See Row Information* Low  MEWS guidelines implemented *See Row Information* Yes  Treat  MEWS Interventions Escalated (See documentation below)  Take Vital Signs  Increase Vital Sign Frequency  Yellow: Q 2hr X 2 then Q 4hr X 2, if remains yellow, continue Q 4hrs  Escalate  MEWS: Escalate Yellow: discuss with charge nurse/RN and consider discussing with provider and RRT  Notify: Charge Nurse/RN  Name of Charge Nurse/RN Notified [place], RN   Date Charge Nurse/RN Notified 07/16/19  Time Charge Nurse/RN Notified 2230  Notify: Provider  Provider Name/Title Dr. Dorita Fray  Date Provider Notified 07/16/19  Time Provider Notified 1045  Notification Type Page  Notification Reason Other (Comment) (low bp)  Response No new orders;Other (Comment) (Hold Sherryll Burger)  Date of Provider Response 07/16/19  Time of Provider Response 1050  Document  Patient Outcome Other (Comment) (Patient remains stable )  Progress note created (see row info) Yes

## 2019-07-17 NOTE — Progress Notes (Signed)
  SBP this morning 60-70s. A&O. Denies dizziness.   Suspect over diuresed. Hold entresto and torsemide.   Likely switch to losartan tomorrow.   No discharge today.   Elaysia Devargas NP-C  9:26 AM

## 2019-07-17 NOTE — Telephone Encounter (Signed)
Patient Advocate Encounter   Received notification from [facility] that prior authorization for Sherryll Burger is required.   PA submitted on CoverMyMeds Key  [id number] X Status is pending   Will continue to follow.

## 2019-07-17 NOTE — Progress Notes (Addendum)
   07/17/19 0820  Assess: MEWS Score  Temp 97.7 F (36.5 C)  BP (!) 65/49  Resp 18  SpO2 98 %  O2 Device Room Air  Assess: MEWS Score  MEWS Temp 0  MEWS Systolic 3  MEWS Pulse 0  MEWS RR 0  MEWS LOC 0  MEWS Score 3  MEWS Score Color Yellow  Assess: if the MEWS score is Yellow or Red  Were vital signs taken at a resting state? Yes  Focused Assessment Documented focused assessment  Early Detection of Sepsis Score *See Row Information* Low  MEWS guidelines implemented *See Row Information* Yes  Treat  MEWS Interventions Escalated (See documentation below)  Take Vital Signs  Increase Vital Sign Frequency  Yellow: Q 2hr X 2 then Q 4hr X 2, if remains yellow, continue Q 4hrs  Escalate  MEWS: Escalate Yellow: discuss with charge nurse/RN and consider discussing with provider and RRT  Notify: Charge Nurse/RN  Name of Charge Nurse/RN Notified GraceRn  Date Charge Nurse/RN Notified 07/17/19  Time Charge Nurse/RN Notified 0820  Notify: Provider  Provider Name/Title VBhagat (Cards)  Date Provider Notified 07/17/19  Time Provider Notified [phone]  Notification Type Page  Notification Reason  (low BP)  Date of Provider Response 07/17/19  Time of Provider Response [phone]   CHF Team paged.

## 2019-07-18 LAB — GLUCOSE, CAPILLARY
Glucose-Capillary: 101 mg/dL — ABNORMAL HIGH (ref 70–99)
Glucose-Capillary: 116 mg/dL — ABNORMAL HIGH (ref 70–99)

## 2019-07-18 LAB — BASIC METABOLIC PANEL
Anion gap: 9 (ref 5–15)
BUN: 15 mg/dL (ref 8–23)
CO2: 26 mmol/L (ref 22–32)
Calcium: 9 mg/dL (ref 8.9–10.3)
Chloride: 101 mmol/L (ref 98–111)
Creatinine, Ser: 0.84 mg/dL (ref 0.61–1.24)
GFR calc Af Amer: >60 mL/min (ref >60)
GFR calc non Af Amer: >60 mL/min (ref >60)
Glucose, Bld: 104 mg/dL — ABNORMAL HIGH (ref 70–99)
Potassium: 3.9 mmol/L (ref 3.5–5.1)
Sodium: 136 mmol/L (ref 135–145)

## 2019-07-18 MED ORDER — LOSARTAN POTASSIUM 25 MG PO TABS: 12.5000 mg | ORAL_TABLET | Freq: Every day | ORAL | Status: DC

## 2019-07-18 MED ORDER — TORSEMIDE 20 MG PO TABS: 40.0000 mg | ORAL_TABLET | Freq: Two times a day (BID) | ORAL | 6 refills | Status: AC

## 2019-07-18 NOTE — Progress Notes (Addendum)
Patient ID: Matthew Mccormick, male   DOB: 1955/05/24, 64 y.o.   MRN: [id number]     Advanced Heart Failure Rounding Note  PCP-Cardiologist: Prentice Docker, MD   Subjective:   Yesterday hypotensive so entresto and torsemide held. SBP was in the 60-70s. Today SBP 80s.   Denies SOB. Wants to go home.     Objective:   Weight Range: 90 kg Body mass index is 28.48 kg/m.   Vital Signs:   Temp:  [97.7 F (36.5 C)-98.7 F (37.1 C)] 98 F (36.7 C) (06/09 0253) Pulse Rate:  [74-98] 91 (06/09 0253) Resp:  [18] 18 (06/09 0253) BP: (82-115)/(52-78) 93/63 (06/09 0253) SpO2:  [97 %-100 %] 99 % (06/09 0253) Weight:  [90 kg] 90 kg (06/09 0200) Last BM Date: 07/13/19  Weight change: Filed Weights   07/16/19 0253 07/17/19 0432 07/18/19 0200  Weight: 91 kg 89.3 kg 90 kg    Intake/Output:   Intake/Output Summary (Last 24 hours) at 07/18/2019 0907 Last data filed at 07/18/2019 0807 Gross per 24 hour  Intake 1540 ml  Output 2100 ml  Net -560 ml      Physical Exam    General:  No resp difficulty HEENT: normal Neck: supple. JVP 7-8. Carotids 2+ bilat; no bruits. No lymphadenopathy or thryomegaly appreciated. Cor: PMI nondisplaced. Regular rate & rhythm. No rubs, gallops or murmurs. Lungs: clear Abdomen: soft, nontender, nondistended. No hepatosplenomegaly. No bruits or masses. Good bowel sounds. Extremities: no cyanosis, clubbing, rash, edema Neuro: alert & orientedx3, cranial nerves grossly intact. moves all 4 extremities w/o difficulty. Affect pleasant   Telemetry   NSR 80s   Labs    CBC No results for input(s): WBC, NEUTROABS, HGB, HCT, MCV, PLT in the last 72 hours. Basic Metabolic Panel Recent Labs    11/94/17 0711 07/18/19 0407  NA 137 136  K 3.8 3.9  CL 103 101  CO2 23 26  GLUCOSE 95 104*  BUN 14 15  CREATININE 0.76 0.84  CALCIUM 8.7* 9.0   Liver Function Tests No results for input(s): AST, ALT, ALKPHOS, BILITOT, PROT, ALBUMIN in the last 72 hours. No  results for input(s): LIPASE, AMYLASE in the last 72 hours. Cardiac Enzymes No results for input(s): CKTOTAL, CKMB, CKMBINDEX, TROPONINI in the last 72 hours.  BNP: BNP (last 3 results) Recent Labs    03/14/19 1832 07/13/19 0939  BNP 1,338.6* 1,733.6*    ProBNP (last 3 results) No results for input(s): PROBNP in the last 8760 hours.   D-Dimer No results for input(s): DDIMER in the last 72 hours. Hemoglobin A1C No results for input(s): HGBA1C in the last 72 hours. Fasting Lipid Panel No results for input(s): CHOL, HDL, LDLCALC, TRIG, CHOLHDL, LDLDIRECT in the last 72 hours. Thyroid Function Tests No results for input(s): TSH, T4TOTAL, T3FREE, THYROIDAB in the last 72 hours.  Invalid input(s): FREET3  Other results:   Imaging    No results found.   Medications:     Scheduled Medications: . aspirin EC  81 mg Oral Daily  . atorvastatin  40 mg Oral Daily  . clopidogrel  75 mg Oral Daily  . digoxin  0.125 mg Oral Daily  . empagliflozin  10 mg Oral Daily  . enoxaparin (LOVENOX) injection  40 mg Subcutaneous Q24H  . insulin detemir  10 Units Subcutaneous BID  . ivabradine  7.5 mg Oral BID WC  . sodium chloride flush  3 mL Intravenous Once  . sodium chloride flush  3 mL Intravenous Q12H  .  spironolactone  25 mg Oral Daily    Infusions: . sodium chloride      PRN Medications: sodium chloride, acetaminophen, ALPRAZolam, nitroGLYCERIN, ondansetron (ZOFRAN) IV, sodium chloride flush, zolpidem    Assessment/Plan   1. Acute on chronic systolic CHF: Echo in 3664 with EF 30-35%. Admission to [facility] 2/21 for a/c CHF w/ low output. ECHO 03/14/19  EF down to 10-15% with severe RV dysfunction on echo. Ischemic cardiomyopathy based on cath. No ACS during admission (HS-TnI in the 100s range with no trend). RHC w/ low CI, 1.98, and required milrinone for RV support and to aid in diuresis. Weaned off milrinone. He was admitted on 6/4 again with volume overload.  Echo this  admission with EF 20-25%, normal RV, IVC dilated.   - Overall wt down 8 lb. Now Euvolemic - Continue torsemide 40 mg bid.  - Continue digoxin 0.125 daily, level ok at 0.6 (checked 6/6)  - Continue spironolactone 25 mg daily.  - No losartan for now. SPB 84/58. Would not rechallenge entresto.   - Continue ivabradine 7.5 mg twice a day  - He will need eventual ICD placement with persistently low EF, not CRT candidate. EP has seen, will plan to do as outpatient. Has appt w/ Dr. Rayann Heman 7/2.  2. CAD: Severe 3VD on cath 2/21. Not a CABG candidate due to poor functional capacity at baseline. S/patherectomy/stenting left main into LAD and PTCA of ramus on 03/22/19. DFR of RCA and PL is 0.91, confirming nonhemodynamically significant lesions (plan medical therapy). No further chest pain, doubt ACS this admission. Mild HS-troponin elevation without trend, suspect demand ischemia due to volume overload/CHF.  - No chest pain.  - Continue DAPT w/ ASA/Plavix indefinitely  - ContinueCrestor 20, (lipids at goal, LDL 62).  3. H/o CVA: Residual left-sided weakness as well as some cognitive dysfunction per notes.  - He continues on Plavix.  - Continue statin.  4. PAD: Moderate RLE vascular disease by ABIs, left normal. Denies claudication - continue medical therapy, ASA/Plavix + statin  HH ordered. He has CNA 5-6 days week 8 hours per day.  D/C today.    Length of Stay: [place], NP  07/18/2019, 9:07 AM  Advanced Heart Failure Team Pager [phone] (M-F; 7a - 4p)  Please contact [facility] for night-coverage after hours (4p -7a ) and weekends on [email or web address]  Patient seen with NP, agree with the above note.   SBP still in the 80s-90s, now off Entresto.  Will hold off on adding losartan this admission, will try to start as outpatient.  Continue torsemide 40 mg bid for home.  Plan for ICD as outpatient.   He can go home today, will arrange CHF clinic followup.   Loralie Champagne 07/18/2019

## 2019-07-18 NOTE — TOC Transition Note (Addendum)
Transition of Care [facility]) - CM/SW Discharge Note   Patient Details  Name: Matthew Mccormick MRN: [id number] Date of Birth: 28-Jan-1956  Transition of Care [facility]) CM/SW Contact:  Leone Haven, RN Phone Number: 07/18/2019, 10:11 AM   Clinical Narrative:    Patient for dc today, NCM offered choice, patient states to call Dinah ,  NCM spoke with [name], she states [facility] is ok, NCM made referral to [facility] with [facility] for HHPT,HHOT, soc will begin tomorrow.  Patient states he has a walker and a cane at home.  Dinah states Clarnce Flock, her daughter will be transporting him home, also [name] will notify patient's pcs caregiver that he is for dc today, she does his dressing on his arm for him.  Daughter also states they are getting private duty nurse set up.     Final next level of care: Home w Home Health Services Barriers to Discharge: No Barriers Identified   Patient Goals and CMS Choice Patient states their goals for this hospitalization and ongoing recovery are:: get better CMS Medicare.gov Compare Post Acute Care list provided to:: Patient Represenative (must comment) Choice offered to / list presented to : Adult Children  Discharge Placement                       Discharge Plan and Services                  DME Agency: NA       HH Arranged: PT, OT HH Agency: [facility] Date [facility] Agency Contacted: 07/18/19 Time HH Agency Contacted: 1010 Representative spoke with at [facility] Agency: Elnita Maxwell  Social Determinants of Health (SDOH) Interventions     Readmission Risk Interventions No flowsheet data found.

## 2019-07-20 ENCOUNTER — Inpatient Hospital Stay (HOSPITAL_COMMUNITY)
Admission: EM | Admit: 2019-07-20 | Discharge: 2019-07-23 | DRG: 292 | Disposition: A | Discharge status: Home Health | Payer: Medicare Other | Attending: Internal Medicine | Admitting: Internal Medicine

## 2019-07-20 ENCOUNTER — Other Ambulatory Visit: Payer: Self-pay

## 2019-07-20 ENCOUNTER — Other Ambulatory Visit: Payer: Self-pay | Admitting: *Deleted

## 2019-07-20 DIAGNOSIS — Z7982 Long term (current) use of aspirin: Secondary | ICD-10-CM | POA: Diagnosis not present

## 2019-07-20 DIAGNOSIS — E785 Hyperlipidemia, unspecified: Secondary | ICD-10-CM | POA: Diagnosis present

## 2019-07-20 DIAGNOSIS — Z20822 Contact with and (suspected) exposure to covid-19: Secondary | ICD-10-CM | POA: Diagnosis not present

## 2019-07-20 DIAGNOSIS — I5022 Chronic systolic (congestive) heart failure: Secondary | ICD-10-CM

## 2019-07-20 DIAGNOSIS — Z955 Presence of coronary angioplasty implant and graft: Secondary | ICD-10-CM | POA: Diagnosis not present

## 2019-07-20 DIAGNOSIS — I5023 Acute on chronic systolic (congestive) heart failure: Secondary | ICD-10-CM | POA: Diagnosis present

## 2019-07-20 DIAGNOSIS — Z95811 Presence of heart assist device: Secondary | ICD-10-CM | POA: Diagnosis not present

## 2019-07-20 DIAGNOSIS — R0602 Shortness of breath: Secondary | ICD-10-CM | POA: Diagnosis present

## 2019-07-20 DIAGNOSIS — E119 Type 2 diabetes mellitus without complications: Secondary | ICD-10-CM

## 2019-07-20 DIAGNOSIS — I255 Ischemic cardiomyopathy: Secondary | ICD-10-CM | POA: Diagnosis not present

## 2019-07-20 DIAGNOSIS — Z794 Long term (current) use of insulin: Secondary | ICD-10-CM | POA: Diagnosis not present

## 2019-07-20 DIAGNOSIS — I2510 Atherosclerotic heart disease of native coronary artery without angina pectoris: Secondary | ICD-10-CM | POA: Diagnosis present

## 2019-07-20 DIAGNOSIS — Z7902 Long term (current) use of antithrombotics/antiplatelets: Secondary | ICD-10-CM | POA: Diagnosis not present

## 2019-07-20 DIAGNOSIS — I493 Ventricular premature depolarization: Secondary | ICD-10-CM | POA: Diagnosis not present

## 2019-07-20 DIAGNOSIS — Z87891 Personal history of nicotine dependence: Secondary | ICD-10-CM | POA: Diagnosis not present

## 2019-07-20 DIAGNOSIS — I69354 Hemiplegia and hemiparesis following cerebral infarction affecting left non-dominant side: Secondary | ICD-10-CM

## 2019-07-20 DIAGNOSIS — Z8673 Personal history of transient ischemic attack (TIA), and cerebral infarction without residual deficits: Secondary | ICD-10-CM

## 2019-07-20 DIAGNOSIS — Z79899 Other long term (current) drug therapy: Secondary | ICD-10-CM | POA: Diagnosis not present

## 2019-07-20 DIAGNOSIS — E1169 Type 2 diabetes mellitus with other specified complication: Secondary | ICD-10-CM | POA: Diagnosis not present

## 2019-07-20 DIAGNOSIS — R778 Other specified abnormalities of plasma proteins: Secondary | ICD-10-CM | POA: Diagnosis not present

## 2019-07-20 DIAGNOSIS — I110 Hypertensive heart disease with heart failure: Secondary | ICD-10-CM | POA: Diagnosis not present

## 2019-07-20 DIAGNOSIS — I4891 Unspecified atrial fibrillation: Secondary | ICD-10-CM | POA: Diagnosis present

## 2019-07-20 DIAGNOSIS — I10 Essential (primary) hypertension: Secondary | ICD-10-CM | POA: Diagnosis present

## 2019-07-20 DIAGNOSIS — E1151 Type 2 diabetes mellitus with diabetic peripheral angiopathy without gangrene: Secondary | ICD-10-CM | POA: Diagnosis present

## 2019-07-20 DIAGNOSIS — I5043 Acute on chronic combined systolic (congestive) and diastolic (congestive) heart failure: Secondary | ICD-10-CM | POA: Diagnosis not present

## 2019-07-20 LAB — CBC WITH DIFFERENTIAL/PLATELET
Abs Immature Granulocytes: 0.03 10*3/uL (ref 0.00–0.07)
Basophils Absolute: 0.1 10*3/uL (ref 0.0–0.1)
Basophils Relative: 1 %
Eosinophils Absolute: 0.2 10*3/uL (ref 0.0–0.5)
Eosinophils Relative: 2 %
HCT: 38.1 % — ABNORMAL LOW (ref 39.0–52.0)
Hemoglobin: 12.2 g/dL — ABNORMAL LOW (ref 13.0–17.0)
Immature Granulocytes: 0 %
Lymphocytes Relative: 24 %
Lymphs Abs: 2.3 10*3/uL (ref 0.7–4.0)
MCH: 30.8 pg (ref 26.0–34.0)
MCHC: 32 g/dL (ref 30.0–36.0)
MCV: 96.2 fL (ref 80.0–100.0)
Monocytes Absolute: 0.7 10*3/uL (ref 0.1–1.0)
Monocytes Relative: 7 %
Neutro Abs: 6.4 10*3/uL (ref 1.7–7.7)
Neutrophils Relative %: 66 %
Platelets: 285 10*3/uL (ref 150–400)
RBC: 3.96 MIL/uL — ABNORMAL LOW (ref 4.22–5.81)
RDW: 14.7 % (ref 11.5–15.5)
WBC: 9.8 10*3/uL (ref 4.0–10.5)
nRBC: 0 % (ref 0.0–0.2)

## 2019-07-20 LAB — COMPREHENSIVE METABOLIC PANEL
ALT: 19 U/L (ref 0–44)
AST: 38 U/L (ref 15–41)
Albumin: 3.4 g/dL — ABNORMAL LOW (ref 3.5–5.0)
Alkaline Phosphatase: 74 U/L (ref 38–126)
Anion gap: 13 (ref 5–15)
BUN: 24 mg/dL — ABNORMAL HIGH (ref 8–23)
CO2: 22 mmol/L (ref 22–32)
Calcium: 9 mg/dL (ref 8.9–10.3)
Chloride: 102 mmol/L (ref 98–111)
Creatinine, Ser: 0.9 mg/dL (ref 0.61–1.24)
GFR calc Af Amer: >60 mL/min (ref >60)
GFR calc non Af Amer: >60 mL/min (ref >60)
Glucose, Bld: 172 mg/dL — ABNORMAL HIGH (ref 70–99)
Potassium: 4.3 mmol/L (ref 3.5–5.1)
Sodium: 137 mmol/L (ref 135–145)
Total Bilirubin: 1 mg/dL (ref 0.3–1.2)
Total Protein: 6.6 g/dL (ref 6.5–8.1)

## 2019-07-20 LAB — GLUCOSE, CAPILLARY: Glucose-Capillary: 146 mg/dL — ABNORMAL HIGH (ref 70–99)

## 2019-07-20 LAB — MAGNESIUM: Magnesium: 1.9 mg/dL (ref 1.7–2.4)

## 2019-07-20 LAB — BRAIN NATRIURETIC PEPTIDE: B Natriuretic Peptide: 2633.9 pg/mL — ABNORMAL HIGH (ref 0.0–100.0)

## 2019-07-20 MED ORDER — EMPAGLIFLOZIN 10 MG PO TABS
10.0000 mg | ORAL_TABLET | Freq: Every day | ORAL | Status: DC
  Administered 2019-07-21 – 2019-07-23 (×3): 10 mg via ORAL
  Filled 2019-07-20 (×4): qty 1

## 2019-07-20 MED ORDER — SODIUM CHLORIDE 0.9 % IV SOLN: 250.0000 mL | INTRAVENOUS | Status: DC | PRN

## 2019-07-20 MED ORDER — ASPIRIN EC 81 MG PO TBEC
81.0000 mg | DELAYED_RELEASE_TABLET | Freq: Every day | ORAL | Status: DC
  Administered 2019-07-21 – 2019-07-23 (×3): 81 mg via ORAL
  Filled 2019-07-20 (×3): qty 1

## 2019-07-20 MED ORDER — CLOPIDOGREL BISULFATE 75 MG PO TABS
75.0000 mg | ORAL_TABLET | Freq: Every day | ORAL | Status: DC
  Administered 2019-07-21 – 2019-07-23 (×3): 75 mg via ORAL
  Filled 2019-07-20 (×3): qty 1

## 2019-07-20 MED ORDER — INSULIN DETEMIR 100 UNIT/ML ~~LOC~~ SOLN
10.0000 [IU] | Freq: Two times a day (BID) | SUBCUTANEOUS | Status: DC
  Administered 2019-07-20 – 2019-07-23 (×7): 10 [IU] via SUBCUTANEOUS
  Filled 2019-07-20 (×8): qty 0.1

## 2019-07-20 MED ORDER — IVABRADINE HCL 7.5 MG PO TABS
7.5000 mg | ORAL_TABLET | Freq: Two times a day (BID) | ORAL | Status: DC
  Administered 2019-07-21 – 2019-07-23 (×6): 7.5 mg via ORAL
  Filled 2019-07-20 (×8): qty 1

## 2019-07-20 MED ORDER — ATORVASTATIN CALCIUM 40 MG PO TABS
40.0000 mg | ORAL_TABLET | Freq: Every day | ORAL | Status: DC
  Administered 2019-07-21 – 2019-07-23 (×3): 40 mg via ORAL
  Filled 2019-07-20 (×3): qty 1

## 2019-07-20 MED ORDER — TORSEMIDE 20 MG PO TABS
40.0000 mg | ORAL_TABLET | Freq: Two times a day (BID) | ORAL | Status: DC
  Administered 2019-07-21 – 2019-07-23 (×6): 40 mg via ORAL
  Filled 2019-07-20 (×6): qty 2

## 2019-07-20 MED ORDER — ACETAMINOPHEN 325 MG PO TABS: 650.0000 mg | ORAL_TABLET | ORAL | Status: DC | PRN

## 2019-07-20 MED ORDER — SPIRONOLACTONE 25 MG PO TABS
25.0000 mg | ORAL_TABLET | Freq: Every day | ORAL | Status: DC
  Administered 2019-07-21 – 2019-07-23 (×3): 25 mg via ORAL
  Filled 2019-07-20 (×3): qty 1

## 2019-07-20 MED ORDER — INSULIN DETEMIR 100 UNIT/ML ~~LOC~~ SOLN
15.0000 [IU] | Freq: Two times a day (BID) | SUBCUTANEOUS | Status: DC
  Filled 2019-07-20: qty 0.15

## 2019-07-20 MED ORDER — DIGOXIN 125 MCG PO TABS
0.1250 mg | ORAL_TABLET | Freq: Every day | ORAL | Status: DC
  Administered 2019-07-21 – 2019-07-23 (×3): 0.125 mg via ORAL
  Filled 2019-07-20 (×3): qty 1

## 2019-07-20 MED ORDER — SODIUM CHLORIDE 0.9% FLUSH
3.0000 mL | Freq: Two times a day (BID) | INTRAVENOUS | Status: DC
  Administered 2019-07-20 – 2019-07-23 (×6): 3 mL via INTRAVENOUS

## 2019-07-20 MED ORDER — SODIUM CHLORIDE 0.9% FLUSH: 3.0000 mL | INTRAVENOUS | Status: DC | PRN

## 2019-07-20 MED ORDER — ONDANSETRON HCL 4 MG/2ML IJ SOLN: 4.0000 mg | Freq: Four times a day (QID) | INTRAMUSCULAR | Status: DC | PRN

## 2019-07-20 NOTE — Patient Outreach (Signed)
[facility]) Care Management  07/20/2019  DEONDRICK SEARLS Oct 02, 1955 [id number]   Telephone assessment - transition of care.  Pt home phone went unanswered. Called pt's cousin, [name], and left a message to advise, unable to reach pt.  Received message from Dinah with her daughter's  phone number, for follow up. [name] is away from the area at this time.  Zara Council. Burgess Estelle, MSN, [facility] Gerontological Nurse Practitioner [facility] Care Management [phone]

## 2019-07-20 NOTE — Progress Notes (Signed)
Pt arrived on the unit from [facility]. AOx4. VS taken. Pt resting comfortably in bed. Admissions paged. Will monitor.

## 2019-07-20 NOTE — H&P (Signed)
Cardiology Admission History and Physical:   Patient ID: Matthew Mccormick MRN: [id number]; DOB: 1955-09-04   Admission date: 07/20/2019  Primary Care Provider: Kirstie Peri, MD [facility] Cardiologist: Prentice Docker, MD   [facility] Electrophysiologist:  None    Chief Complaint: Shortness of breath  Patient Profile:   Matthew Mccormick is a 64 y.o. male with HTN, HLD, DM, prior CVA, CAD s/p recent PCI in February 2021 and ICM (EF ~30%) who presents with shortness of breath.  History of Present Illness:   Matthew Mccormick was admitted 6/4-6/9 for shortness of breath and diuresed. He has been doing well at home with assistance from his aids. This morning, he became very frustrated as he was not able to open the cans in his house to be able to eat (states that his aids have been bringing him easy to open cans of food to prepare for himself). He was very frustrated by this and was unsure if an aid would be coming to help today. As he got more angry, he started to feel short of breath and used his call button to phone help. He denies having any fevers, chills, chest pain, palpitations, lightheadedness, dizziness or orthopnea. He feels back to baseline now. He feels that his legs are about the same as they have been. He does not know what his weights have been at home but his aid has been checking this daily.    Past Medical History:  Diagnosis Date  . 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 Chronic kidney disease   . Chronic systolic CHF (congestive heart failure) (HCC)   . Diabetes mellitus without complication (HCC)   . Hyperlipidemia   . Hypertension   . Slow rate of speech   . Stroke [facility]) 2019   left and shaky and unsteady on feet.    Past Surgical History:  Procedure Laterality Date  . CATARACT EXTRACTION W/PHACO Right 06/18/2013   Procedure: CATARACT EXTRACTION PHACO AND INTRAOCULAR LENS PLACEMENT (IOC);  Surgeon: Gemma Payor, MD;  Location: [facility];  Service:  Ophthalmology;  Laterality: Right;  CDE 34.55  . CORONARY ATHERECTOMY N/A 03/22/2019   Procedure: CORONARY ATHERECTOMY;  Surgeon: Marykay Lex, MD;  Location: [facility] INVASIVE CV LAB;  Service: Cardiovascular;  Laterality: N/A;  . INTRAVASCULAR PRESSURE WIRE/FFR STUDY N/A 03/26/2019   Procedure: INTRAVASCULAR PRESSURE WIRE/FFR STUDY;  Surgeon: Tonny Bollman, MD;  Location: [facility] INVASIVE CV LAB;  Service: Cardiovascular;  Laterality: N/A;  . INTRAVASCULAR ULTRASOUND/IVUS N/A 03/22/2019   Procedure: Intravascular Ultrasound/IVUS;  Surgeon: Marykay Lex, MD;  Location: [facility] INVASIVE CV LAB;  Service: Cardiovascular;  Laterality: N/A;  . ORIF ANKLE DISLOCATION Right   . RIGHT/LEFT HEART CATH AND CORONARY ANGIOGRAPHY N/A 03/19/2019   Procedure: RIGHT/LEFT HEART CATH AND CORONARY ANGIOGRAPHY;  Surgeon: Laurey Morale, MD;  Location: [facility] INVASIVE CV LAB;  Service: Cardiovascular;  Laterality: N/A;  . VENTRICULAR ASSIST DEVICE INSERTION N/A 03/22/2019   Procedure: VENTRICULAR ASSIST DEVICE INSERTION;  Surgeon: Marykay Lex, MD;  Location: [facility] INVASIVE CV LAB;  Service: Cardiovascular;  Laterality: N/A;     Medications Prior to Admission: Prior to Admission medications   Medication Sig Start Date End Date Taking? Authorizing Provider  aspirin EC 81 MG tablet Take 81 mg by mouth daily.   Yes [provider]  atorvastatin (LIPITOR) 40 MG tablet Take 40 mg by mouth daily.   Yes [provider]  clopidogrel (PLAVIX) 75 MG tablet Take 75  mg by mouth daily.    Yes [provider]  digoxin (LANOXIN) 0.125 MG tablet Take 1 tablet (0.125 mg total) by mouth daily. 05/15/19  Yes Clegg, Amy D, NP  empagliflozin (JARDIANCE) 10 MG TABS tablet Take 10 mg by mouth daily.    Yes [provider]  ibuprofen (ADVIL) 200 MG tablet Take 200 mg by mouth every 6 (six) hours as needed for headache.   Yes [provider]  insulin detemir (LEVEMIR) 100 UNIT/ML injection Inject 0.2 mLs  (20 Units total) into the skin 2 (two) times daily. Patient taking differently: Inject 15-20 Units into the skin See admin instructions. Taking 20 units in The AM and 15 units in the PM 03/29/19  Yes Gonfa, Charlesetta Ivory, MD  ivabradine (CORLANOR) 7.5 MG TABS tablet Take 1 tablet (7.5 mg total) by mouth 2 (two) times daily with a meal. 05/15/19  Yes Clegg, Amy D, NP  metFORMIN (GLUCOPHAGE) 1000 MG tablet Take 1,000 mg by mouth 2 (two) times daily with a meal.   Yes [provider]  spironolactone (ALDACTONE) 25 MG tablet Take 1 tablet (25 mg total) by mouth daily. 03/30/19  Yes Mercy Riding, MD  torsemide (DEMADEX) 20 MG tablet Take 2 tablets (40 mg total) by mouth 2 (two) times daily. 07/18/19  Yes Clegg, Amy D, NP     Allergies:   No Known Allergies  Social History:   Social History   Socioeconomic History  . Marital status: Single    Spouse name: Not on file  . Number of children: Not on file  . Years of education: 9th grade  . Highest education level: Not on file  Occupational History  . Not on file  Tobacco Use  . Smoking status: Former Smoker    Packs/day: 1.00    Years: 1.00    Pack years: 1.00    Types: Cigarettes    Quit date: 06/16/1991    Years since quitting: 28.1  . Smokeless tobacco: Never Used  Vaping Use  . Vaping Use: Never used  Substance and Sexual Activity  . Alcohol use: No  . Drug use: No  . Sexual activity: Yes    Birth control/protection: None  Other Topics Concern  . Not on file  Social History Narrative   Pt lives alone in 1 story home   8th grade education   Has home health aid 6days/week   Social Determinants of Health   Financial Resource Strain: Medium Risk  . Difficulty of Paying Living Expenses: Somewhat hard  Food Insecurity: No Food Insecurity  . Worried About Charity fundraiser in the Last Year: Never true  . Ran Out of Food in the Last Year: Never true  Transportation Needs: No Transportation Needs  . Lack of Transportation  (Medical): No  . Lack of Transportation (Non-Medical): No  Physical Activity: Insufficiently Active  . Days of Exercise per Week: 3 days  . Minutes of Exercise per Session: 10 min  Stress: No Stress Concern Present  . Feeling of Stress : Not at all  Social Connections: Moderately Integrated  . Frequency of Communication with Friends and Family: More than three times a week  . Frequency of Social Gatherings with Friends and Family: More than three times a week  . Attends Religious Services: 1 to 4 times per year  . Active Member of Clubs or Organizations: Yes  . Attends Archivist Meetings: 1 to 4 times per year  . Marital Status: Never  married  Intimate Partner Violence: Not At Risk  . Fear of Current or Ex-Partner: No  . Emotionally Abused: No  . Physically Abused: No  . Sexually Abused: No    Family History:   The patient's family history is negative for Liver disease.    ROS:  Please see the history of present illness.  All other ROS reviewed and negative.     Physical Exam/Data:  There were no vitals filed for this visit. No intake or output data in the 24 hours ending 07/20/19 2207 Last 3 Weights 07/18/2019 07/17/2019 07/16/2019  Weight (lbs) 198 lb 8 oz 196 lb 12.8 oz 200 lb 9.6 oz  Weight (kg) 90.039 kg 89.268 kg 90.992 kg     There is no height or weight on file to calculate BMI.  General:  Chronically ill, sitting comfortably in bed  HEENT: normal Neck: no JVD Cardiac:  Tachycardic, regular rhythm. No murmurs, rubs or gallops Lungs:  clear to auscultation bilaterally, no wheezing, rhonchi or rales  Abd: soft, nontender, no hepatomegaly  Ext: 1+ edema bilaterally Musculoskeletal:  No deformities, BUE and BLE strength normal and equal Skin: warm and dry  Neuro:  CNs 2-12 intact, no focal abnormalities noted Psych:  Normal affect    EKG:  Ordered   Relevant CV Studies: Echo 07/14/19 1. Left ventricular ejection fraction, by estimation, is 20 to 25%. The    left ventricle has severely decreased function. The left ventricle  demonstrates global hypokinesis. Left ventricular diastolic parameters are  indeterminate.  2. Right ventricular systolic function is normal. The right ventricular  size is mildly enlarged. There is moderately elevated pulmonary artery  systolic pressure. The estimated right ventricular systolic pressure is  56.0 mmHg.  3. Left atrial size was moderately dilated.  4. Right atrial size was moderately dilated.  5. The mitral valve is normal in structure. Moderate mitral valve  regurgitation. No evidence of mitral stenosis.  6. Tricuspid valve regurgitation is moderate.  7. The aortic valve is normal in structure. Aortic valve regurgitation is  not visualized. No aortic stenosis is present.  8. The inferior vena cava is dilated in size with <50% respiratory  variability, suggesting right atrial pressure of 15 mmHg.   Laboratory Data:  High Sensitivity Troponin:   Recent Labs  Lab 07/13/19 0939 07/13/19 1226 07/13/19 1928 07/13/19 2117  TROPONINIHS 172* 200* 309* 289*      Chemistry Recent Labs  Lab 07/17/19 0711 07/18/19 0407  NA 137 136  K 3.8 3.9  CL 103 101  CO2 23 26  GLUCOSE 95 104*  BUN 14 15  CREATININE 0.76 0.84  CALCIUM 8.7* 9.0  GFRNONAA >60 >60  GFRAA >60 >60  ANIONGAP 11 9    No results for input(s): PROT, ALBUMIN, AST, ALT, ALKPHOS, BILITOT in the last 168 hours. HematologyNo results for input(s): WBC, RBC, HGB, HCT, MCV, MCH, MCHC, RDW, PLT in the last 168 hours. BNPNo results for input(s): BNP, PROBNP in the last 168 hours.  DDimer No results for input(s): DDIMER in the last 168 hours.   Radiology/Studies:  No results found. { New York Heart Association (NYHA) Functional Class NYHA Class II  Assessment and Plan:   1. Chronic systolic HF- appears fairly euvolemic on exam. He has some lower extremity edema but JVP flat - Continue home torsemide 40mg  BID - Sodium  restricted diet - Continue digoxin 0.125mg  daily - Continue spiro 25mg  - Continue ivabradine 7.5mg  BID - Unable to tolerate ACE-I/ARB/ARNI or  beta blocker 2/2 hypotension - Monitor on tele - Daily weights  2. Diabetes - Continue home empagliflozin - Home insulin 10 units BID - Hold home metformin  3. CAD s/p PCI 2/21 - Continue home aspirin and plavix  Severity of Illness: The appropriate patient status for this patient is OBSERVATION. Observation status is judged to be reasonable and necessary in order to provide the required intensity of service to ensure the patient's safety. The patient's presenting symptoms, physical exam findings, and initial radiographic and laboratory data in the context of their medical condition is felt to place them at decreased risk for further clinical deterioration. Furthermore, it is anticipated that the patient will be medically stable for discharge from the hospital within 2 midnights of admission. The following factors support the patient status of observation.       For questions or updates, please contact [facility] Please consult [email or web address] for contact info under     Signed, Nicoletta Ba, MD  07/20/2019 10:07 PM

## 2019-07-21 ENCOUNTER — Observation Stay (HOSPITAL_COMMUNITY): Payer: Medicare Other

## 2019-07-21 DIAGNOSIS — I110 Hypertensive heart disease with heart failure: Secondary | ICD-10-CM | POA: Diagnosis present

## 2019-07-21 DIAGNOSIS — Z7902 Long term (current) use of antithrombotics/antiplatelets: Secondary | ICD-10-CM | POA: Diagnosis not present

## 2019-07-21 DIAGNOSIS — I255 Ischemic cardiomyopathy: Secondary | ICD-10-CM

## 2019-07-21 DIAGNOSIS — I5023 Acute on chronic systolic (congestive) heart failure: Secondary | ICD-10-CM

## 2019-07-21 DIAGNOSIS — Z87891 Personal history of nicotine dependence: Secondary | ICD-10-CM | POA: Diagnosis not present

## 2019-07-21 DIAGNOSIS — Z955 Presence of coronary angioplasty implant and graft: Secondary | ICD-10-CM | POA: Diagnosis not present

## 2019-07-21 DIAGNOSIS — I517 Cardiomegaly: Secondary | ICD-10-CM | POA: Diagnosis not present

## 2019-07-21 DIAGNOSIS — Z79899 Other long term (current) drug therapy: Secondary | ICD-10-CM | POA: Diagnosis not present

## 2019-07-21 DIAGNOSIS — R0602 Shortness of breath: Secondary | ICD-10-CM | POA: Diagnosis not present

## 2019-07-21 DIAGNOSIS — Z95811 Presence of heart assist device: Secondary | ICD-10-CM | POA: Diagnosis not present

## 2019-07-21 DIAGNOSIS — Z794 Long term (current) use of insulin: Secondary | ICD-10-CM | POA: Diagnosis not present

## 2019-07-21 DIAGNOSIS — Z20822 Contact with and (suspected) exposure to covid-19: Secondary | ICD-10-CM | POA: Diagnosis present

## 2019-07-21 DIAGNOSIS — Z7982 Long term (current) use of aspirin: Secondary | ICD-10-CM | POA: Diagnosis not present

## 2019-07-21 DIAGNOSIS — E1151 Type 2 diabetes mellitus with diabetic peripheral angiopathy without gangrene: Secondary | ICD-10-CM | POA: Diagnosis present

## 2019-07-21 DIAGNOSIS — I2510 Atherosclerotic heart disease of native coronary artery without angina pectoris: Secondary | ICD-10-CM | POA: Diagnosis present

## 2019-07-21 DIAGNOSIS — I4891 Unspecified atrial fibrillation: Secondary | ICD-10-CM | POA: Diagnosis present

## 2019-07-21 DIAGNOSIS — E785 Hyperlipidemia, unspecified: Secondary | ICD-10-CM | POA: Diagnosis present

## 2019-07-21 DIAGNOSIS — I69354 Hemiplegia and hemiparesis following cerebral infarction affecting left non-dominant side: Secondary | ICD-10-CM | POA: Diagnosis not present

## 2019-07-21 LAB — BASIC METABOLIC PANEL
Anion gap: 11 (ref 5–15)
BUN: 22 mg/dL (ref 8–23)
CO2: 24 mmol/L (ref 22–32)
Calcium: 9.1 mg/dL (ref 8.9–10.3)
Chloride: 102 mmol/L (ref 98–111)
Creatinine, Ser: 0.84 mg/dL (ref 0.61–1.24)
GFR calc Af Amer: >60 mL/min (ref >60)
GFR calc non Af Amer: >60 mL/min (ref >60)
Glucose, Bld: 138 mg/dL — ABNORMAL HIGH (ref 70–99)
Potassium: 4.2 mmol/L (ref 3.5–5.1)
Sodium: 137 mmol/L (ref 135–145)

## 2019-07-21 LAB — GLUCOSE, CAPILLARY
Glucose-Capillary: 149 mg/dL — ABNORMAL HIGH (ref 70–99)
Glucose-Capillary: 195 mg/dL — ABNORMAL HIGH (ref 70–99)
Glucose-Capillary: 199 mg/dL — ABNORMAL HIGH (ref 70–99)
Glucose-Capillary: 188 mg/dL — ABNORMAL HIGH (ref 70–99)

## 2019-07-21 LAB — SARS CORONAVIRUS 2 (TAT 6-24 HRS): SARS Coronavirus 2: NEGATIVE

## 2019-07-21 NOTE — Progress Notes (Signed)
Progress Note  Patient Name: Matthew Mccormick Date of Encounter: 07/21/2019  [facility] Cardiologist: Kate Sable, MD   Subjective   Feels "better" today.  Denies CP.  + mild SOB.  Inpatient Medications    Scheduled Meds: . aspirin EC  81 mg Oral Daily  . atorvastatin  40 mg Oral Daily  . clopidogrel  75 mg Oral Daily  . digoxin  0.125 mg Oral Daily  . empagliflozin  10 mg Oral Daily  . insulin detemir  10 Units Subcutaneous BID  . ivabradine  7.5 mg Oral BID WC  . sodium chloride flush  3 mL Intravenous Q12H  . spironolactone  25 mg Oral Daily  . torsemide  40 mg Oral BID   Continuous Infusions: . sodium chloride     PRN Meds: sodium chloride, acetaminophen, ondansetron (ZOFRAN) IV, sodium chloride flush   Vital Signs    Vitals:   07/21/19 0058 07/21/19 0240 07/21/19 0255 07/21/19 0955  BP:   117/81 109/86  Pulse: 99  (!) 109 (!) 108  Resp:   18 18  Temp:   98.5 F (36.9 C) 98.5 F (36.9 C)  TempSrc:   Oral Oral  SpO2:   100% 100%  Weight:  89.6 kg    Height:       No intake or output data in the 24 hours ending 07/21/19 1104 Last 3 Weights 07/21/2019 07/20/2019 07/18/2019  Weight (lbs) 197 lb 9.6 oz 197 lb 12.8 oz 198 lb 8 oz  Weight (kg) 89.631 kg 89.721 kg 90.039 kg      Telemetry    Sinus rhythm with PVCs, sometimes in a trigeminy patterns. Rates in the 90's to 110's. - Personally Reviewed  ECG    No new ECG tracing today. - Personally Reviewed  Physical Exam   GEN: No acute distress.  Chronically ill Neck: + JVD Cardiac: RRR  Respiratory: Clear to auscultation bilaterally. GI: Soft, nontender, non-distended  MS: No edema; No deformity. Neuro:  Nonfocal  Psych: Normal affect  + dependant edema  Labs    High Sensitivity Troponin:   Recent Labs  Lab 07/13/19 0939 07/13/19 1226 07/13/19 1928 07/13/19 2117  TROPONINIHS 172* 200* 309* 289*      Chemistry Recent Labs  Lab 07/18/19 0407 07/20/19 2214 07/21/19 0548  NA 136  137 137  K 3.9 4.3 4.2  CL 101 102 102  CO2 26 22 24   GLUCOSE 104* 172* 138*  BUN 15 24* 22  CREATININE 0.84 0.90 0.84  CALCIUM 9.0 9.0 9.1  PROT  --  6.6  --   ALBUMIN  --  3.4*  --   AST  --  38  --   ALT  --  19  --   ALKPHOS  --  74  --   BILITOT  --  1.0  --   GFRNONAA >60 >60 >60  GFRAA >60 >60 >60  ANIONGAP 9 13 11      Hematology Recent Labs  Lab 07/20/19 2214  WBC 9.8  RBC 3.96*  HGB 12.2*  HCT 38.1*  MCV 96.2  MCH 30.8  MCHC 32.0  RDW 14.7  PLT 285    BNP Recent Labs  Lab 07/20/19 2214  BNP 2,633.9*     DDimer No results for input(s): DDIMER in the last 168 hours.   Radiology    DG Chest 2 View  Result Date: 07/21/2019 CLINICAL DATA:  Shortness of breath EXAM: CHEST - 2 VIEW COMPARISON:  07/13/2019  FINDINGS: Cardiopericardial enlargement accentuated by low volumes. Fissure thickening on the lateral view with generalized interstitial prominence. No definite Kerley lines, effusion, or consolidation. Coronary stent noted on the lateral view IMPRESSION: Cardiomegaly and borderline edema. Electronically Signed   By: Marnee Spring M.D.   On: 07/21/2019 07:46    Cardiac Studies   Echocardiogram 07/14/2019: Impressions: 1. Left ventricular ejection fraction, by estimation, is 20 to 25%. The  left ventricle has severely decreased function. The left ventricle  demonstrates global hypokinesis. Left ventricular diastolic parameters are  indeterminate.  2. Right ventricular systolic function is normal. The right ventricular  size is mildly enlarged. There is moderately elevated pulmonary artery  systolic pressure. The estimated right ventricular systolic pressure is  56.0 mmHg.  3. Left atrial size was moderately dilated.  4. Right atrial size was moderately dilated.  5. The mitral valve is normal in structure. Moderate mitral valve  regurgitation. No evidence of mitral stenosis.  6. Tricuspid valve regurgitation is moderate.  7. The aortic valve  is normal in structure. Aortic valve regurgitation is  not visualized. No aortic stenosis is present.  8. The inferior vena cava is dilated in size with <50% respiratory  variability, suggesting right atrial pressure of 15 mmHg.   Comparison(s): No significant change from prior study. Prior images  reviewed side by side.   Patient Profile     64 y.o. male with a history of CAD s/p atherectomy and DES to left main/proximal LAD in 03/2019, chronic systolic CHF/ischemic cardiomyopathy with EF of 20-25% on recent Echo on 07/14/2019, prior CVA, hypertension, hyperlipidemia, and diabetes mellitus. He was recently admitted with CHF exacerbation, diuresed, an discharged on 07/18/2019. Transferred from [facility] yesterday for shortness of breath.  Assessment & Plan    1. Acute on chronic systolic dysfunction/ ischemic CM JVP still elevated Continue diuresis with torsemide Sodium restriction Consider addition of coreg (previously not on coreg due to hypotension but may be worth restarting) Per Dr Kathlyn Sacramento' recent note, would not rechallenge with entresto due to hypotension QRS is narrow (<130 msec),  Could consider barostim activation therapy.  I will discuss consideration for primary prevention ICD on elective outpatient follow-up.  He will need stabilization of his CHF prior to ICD considerations.  2. CAD No ischemic symptoms  3.  DM Stable No change required today  I worry about his ability to life alone.  Will consult PT and case management. Prognosis is poor.  Continue aggressive medical therapy over the weekend.    For questions or updates, please contact [facility] Please consult [email or web address] for contact info under        Signed, Corrin Parker, PA-C  07/21/2019, 11:04 AM

## 2019-07-21 NOTE — Consult Note (Signed)
WOC Nurse Consult Note: Reason for Consult: left upper arm, posterior aspect full thickness wound. Seen last week on 07/14/19. This is a surgical wound, site of large cyst removal. Wound type: surgical Pressure Injury POA: N/A Measurement: Bedside Rn to measure and document on Flow Sheet today.  Measurement on 07/14/19 was 2.1cm x 1.2cm x 2cm Wound bed:red,moist Drainage (amount, consistency, odor) Moderate serosanguinous Periwound: Intact, dry. No erythema Dressing procedure/placement/frequency: I will continue my previous guidance for filling of the defect daily with silver hydrofiber. Will need continuation of HHRN services as dressing is in a location that is too difficult for patient to dress independently. Patient to follow up with physician who performed procedure post discharge as directed.  WOC nursing team will not follow, but will remain available to this patient, the nursing and medical teams.  Please re-consult if needed. Thanks, Ladona Mow, MSN, RN, GNP, [place]  Pager# [phone]

## 2019-07-22 DIAGNOSIS — R0602 Shortness of breath: Secondary | ICD-10-CM

## 2019-07-22 LAB — BASIC METABOLIC PANEL
Anion gap: 13 (ref 5–15)
BUN: 22 mg/dL (ref 8–23)
CO2: 23 mmol/L (ref 22–32)
Calcium: 9 mg/dL (ref 8.9–10.3)
Chloride: 99 mmol/L (ref 98–111)
Creatinine, Ser: 1.05 mg/dL (ref 0.61–1.24)
GFR calc Af Amer: >60 mL/min (ref >60)
GFR calc non Af Amer: >60 mL/min (ref >60)
Glucose, Bld: 220 mg/dL — ABNORMAL HIGH (ref 70–99)
Potassium: 3.9 mmol/L (ref 3.5–5.1)
Sodium: 135 mmol/L (ref 135–145)

## 2019-07-22 LAB — GLUCOSE, CAPILLARY
Glucose-Capillary: 113 mg/dL — ABNORMAL HIGH (ref 70–99)
Glucose-Capillary: 158 mg/dL — ABNORMAL HIGH (ref 70–99)
Glucose-Capillary: 211 mg/dL — ABNORMAL HIGH (ref 70–99)

## 2019-07-22 NOTE — TOC Initial Note (Signed)
Transition of Care [facility]) - Initial/Assessment Note    Patient Details  Name: Matthew Mccormick MRN: [id number] Date of Birth: 03/04/1955  Transition of Care [facility]) CM/SW Contact:    Matthew Sabal, RN Phone Number: 07/22/2019, 3:28 PM  Clinical Narrative:                Matthew Mccormick w patient he deferred assessment to Matthew Mccormick his cousin. Patient has HHA 6 days a week, 8-3pm. He spends most of Sunday at church and then gets a meal from [employer] on Sunday. HH services arranged through [facility] with last discharge last week. [facility] notified of admission.  Patient does not have any DME needs at this time, will continue to follow.    Expected Discharge Plan: Home w Home Health Services Barriers to Discharge: Continued Medical Work up   Patient Goals and CMS Choice Patient states their goals for this hospitalization and ongoing recovery are:: to go home CMS Medicare.gov Compare Post Acute Care list provided to:: Other (Comment Required) Choice offered to / list presented to : NA  Expected Discharge Plan and Services Expected Discharge Plan: Home w Home Health Services   Discharge Planning Services: CM Consult Post Acute Care Choice: Home Health                               [facility] Agency: [facility] Date [facility] Agency Contacted: 07/22/19 Time HH Agency Contacted: 1527 Representative spoke with at [facility] Agency: Matthew Mccormick  Prior Living Arrangements/Services   Lives with:: Self              Current home services: DME, Homehealth aide    Activities of Daily Living Home Assistive Devices/Equipment: Cane (specify quad or straight), Walker (specify type) ADL Screening (condition at time of admission) Patient's cognitive ability adequate to safely complete daily activities?: Yes Is the patient deaf or have difficulty hearing?: No Does the patient have difficulty seeing, even when wearing glasses/contacts?: No Does the patient have difficulty concentrating, remembering, or making  decisions?: No Patient able to express need for assistance with ADLs?: Yes Does the patient have difficulty dressing or bathing?: Yes Independently performs ADLs?: No Communication: Independent Dressing (OT): Needs assistance Is this a change from baseline?: Pre-admission baseline Grooming: Needs assistance Is this a change from baseline?: Pre-admission baseline Feeding: Independent Bathing: Dependent Is this a change from baseline?: Pre-admission baseline Toileting: Dependent Is this a change from baseline?: Pre-admission baseline In/Out Bed: Needs assistance Is this a change from baseline?: Pre-admission baseline Walks in Home: Needs assistance Is this a change from baseline?: Pre-admission baseline Does the patient have difficulty walking or climbing stairs?: Yes Weakness of Legs: Both Weakness of Arms/Hands: Both  Permission Sought/Granted                  Emotional Assessment              Admission diagnosis:  Shortness of breath [R06.02] Patient Active Problem List   Diagnosis Date Noted  . Shortness of breath 07/20/2019  . Acute CHF (congestive heart failure) (HCC) 07/13/2019  . Acute on chronic systolic heart failure (HCC)   . Pressure injury of skin 03/14/2019  . Ischemic hepatitis   . Sepsis (HCC) 03/13/2019  . Elevated LFTs   . Elevated INR   . Diabetes mellitus (HCC) 07/26/2016  . Hypertension 07/26/2016  . Coronary artery disease 07/26/2016  . History of CVA (cerebrovascular accident) 07/26/2016  . Atrial  fibrillation ([place]) 07/26/2016  . Dyslipidemia 07/26/2016  . Former cigarette smoker 07/26/2016  . Osteomyelitis of toe of left foot ([place]) 07/26/2016   PCP:  Matthew Blitz, MD Pharmacy:   [id number] - [place], [place] [address] [place] [address] Phone: [phone] Fax: [phone]     Social Determinants of Health (SDOH) Interventions    Readmission Risk  Interventions No flowsheet data found.

## 2019-07-22 NOTE — Evaluation (Signed)
Physical Therapy Evaluation Patient Details Name: Matthew Mccormick MRN: [id number] DOB: [birth year] Today's Date: 07/22/2019   History of Present Illness    64 y.o. male with HTN, HLD, DM, prior CVA, CAD s/p recent PCI in February 2021 and ICM (EF ~30%) who presents with shortness of breath.   Clinical Impression  Pt presents to PT at/near his baseline function for mobility, demonstrating mild residual motor deficits left upper/lower extremity along with generalized weakness, dependency on assistive device for ambulation, and cardiopulmonary dysfunction all contributing to mild limitations when changing positions and moving around. Pt has aide for personal care needs 6-7 hr/day M-F if pt report is accurate, and noted plan at previous d/c to hire private duty nurse and initiate HHPT/OT services. Pt was readmitted <2 days and unable to enact d/c plan.  Agree his deficits are concerning for his safety, however pt is adamant he feels safe and can care for his needs with described resources in place. As pt demonstrates ability to mobilize adequately for home environment, recommend home with [facility] services to address deficits and restore/maintain function with goal of preserving independence.  No further need for PT in acute setting, encourage/assist pt to walk multiple short bouts in halls while in hospital. Consider mobility team referral if pt does not d/c this date.     Follow Up Recommendations Home health PT    Equipment Recommendations  None recommended by PT    Recommendations for Other Services       Precautions / Restrictions Precautions Precautions: Fall Precaution Comments: endorses need for external support and some balance problems Restrictions Weight Bearing Restrictions: No      Mobility  Bed Mobility Overal bed mobility: Modified Independent Bed Mobility: Supine to Sit     Supine to sit: Modified independent (Device/Increase time)     General bed mobility comments: with  effort is able to sit up without assist but HOB was slightly elevated  Transfers Overall transfer level: Modified independent Equipment used: Straight cane Transfers: Sit to/from Stand Sit to Stand: Supervision         General transfer comment: pt able to come to stand from bed height similar to home, does depend on cane for stability, but no LOB noted  Ambulation/Gait Ambulation/Gait assistance: Supervision Gait Distance (Feet): 50 Feet Assistive device: Straight cane Gait Pattern/deviations: Decreased stride length;Decreased dorsiflexion - left;Decreased stance time - left     General Gait Details: mild incr SOB noted/reported, HR 90-100's throughout; note mild L side residual weakness using cane in R hand but over short time is able to reduce dependency on cane  Stairs            Wheelchair Mobility    Modified Rankin (Stroke Patients Only)       Balance Overall balance assessment: Mild deficits observed, not formally tested;History of Falls   Sitting balance-Leahy Scale: Good     Standing balance support: During functional activity;Single extremity supported Standing balance-Leahy Scale: Fair Standing balance comment: able to stand/walk with cane with decreasing support needed as noted in GAIT                             Pertinent Vitals/Pain Pain Assessment: No/denies pain    Home Living Family/patient expects to be discharged to:: Private residence Living Arrangements: Alone Available Help at Discharge: Personal care attendant Type of Home: Apartment Home Access: Ramped entrance     Home Layout: One level Home Equipment:  Cane - single point;Shower seat;Wheelchair - manual;Bedside commode;Walker - 2 wheels Additional Comments: Aide M-F. 8-3, alone otherwise    Prior Function Level of Independence: Needs assistance   Gait / Transfers Assistance Needed: independent with cane  ADL's / Homemaking Assistance Needed: assist for BADL from  aide 6 days a week (states the come during the day only) and assist with bathing, dressing, IADLs  Comments: most info confirmed from previous admission, and pt home <2 days to enact d/c plan from that admission     Hand Dominance        Extremity/Trunk Assessment   Upper Extremity Assessment Upper Extremity Assessment: Overall WFL for tasks assessed (some LUE residual motor deficits noted )    Lower Extremity Assessment Lower Extremity Assessment: Generalized weakness;Overall WFL for tasks assessed       Communication   Communication: No difficulties  Cognition Arousal/Alertness: Awake/alert Behavior During Therapy: WFL for tasks assessed/performed Overall Cognitive Status: Within Functional Limits for tasks assessed                                 General Comments: readmitted due to SOB after became angry he could not open cans of food at home      General Comments      Exercises     Assessment/Plan    PT Assessment All further PT needs can be met in the next venue of care  PT Problem List Decreased strength;Decreased range of motion;Decreased activity tolerance;Decreased balance;Decreased mobility;Decreased knowledge of use of DME;Cardiopulmonary status limiting activity       PT Treatment Interventions      PT Goals (Current goals can be found in the Care Plan section)  Acute Rehab PT Goals Patient Stated Goal: to not fall PT Goal Formulation: All assessment and education complete, DC therapy    Frequency     Barriers to discharge        Co-evaluation               AM-PAC PT "6 Clicks" Mobility  Outcome Measure Help needed turning from your back to your side while in a flat bed without using bedrails?: None Help needed moving from lying on your back to sitting on the side of a flat bed without using bedrails?: None Help needed moving to and from a bed to a chair (including a wheelchair)?: None Help needed standing up from a chair  using your arms (e.g., wheelchair or bedside chair)?: None Help needed to walk in hospital room?: None Help needed climbing 3-5 steps with a railing? : A Little 6 Click Score: 23    End of Session Equipment Utilized During Treatment: Gait belt Activity Tolerance: Patient tolerated treatment well Patient left: in bed;with bed alarm set;with call bell/phone within reach Nurse Communication: Mobility status PT Visit Diagnosis: Muscle weakness (generalized) (M62.81);History of falling (Z91.81);Difficulty in walking, not elsewhere classified (R26.2)    Time: 0900-0930 PT Time Calculation (min) (ACUTE ONLY): 30 min   Charges:   PT Evaluation $PT Eval Low Complexity: 1 Low PT Treatments $Therapeutic Exercise: 23-37 mins        Kearney Hard, PT, DPT, MS Board Certified Geriatric Clinical Specialist   Herbie Drape 07/22/2019, 11:43 AM

## 2019-07-22 NOTE — Progress Notes (Signed)
SATURATION QUALIFICATIONS: (This note is used to comply with regulatory documentation for home oxygen)  Patient Saturations on Room Air at Rest = 97%  Patient Saturations on Room Air while Ambulating = 70%  Patient Saturations on 2Liters of oxygen while Ambulating = 95%  Please briefly explain why patient needs home oxygen:at rest patient is fine with no o2 on it is when he walks or exerts himself his saturations fall,I think he would benefit from home o2. He ambulated half the hallway then dropped his saturation.

## 2019-07-22 NOTE — Plan of Care (Signed)

## 2019-07-22 NOTE — Progress Notes (Signed)
Progress Note   Subjective   Doing well today, the patient denies CP or SOB.  States "I feel a whole lot better".  No new concerns  Inpatient Medications    Scheduled Meds:  aspirin EC  81 mg Oral Daily   atorvastatin  40 mg Oral Daily   clopidogrel  75 mg Oral Daily   digoxin  0.125 mg Oral Daily   empagliflozin  10 mg Oral Daily   insulin detemir  10 Units Subcutaneous BID   ivabradine  7.5 mg Oral BID WC   sodium chloride flush  3 mL Intravenous Q12H   spironolactone  25 mg Oral Daily   torsemide  40 mg Oral BID   Continuous Infusions:  sodium chloride     PRN Meds: sodium chloride, acetaminophen, ondansetron (ZOFRAN) IV, sodium chloride flush   Vital Signs    Vitals:   07/21/19 1447 07/21/19 2231 07/22/19 0439 07/22/19 0748  BP: 107/84 (!) 98/58 102/72 113/85  Pulse: 96 88 85 (!) 101  Resp: 14 18 17 18   Temp: 97.7 F (36.5 C) 97.6 F (36.4 C) 97.9 F (36.6 C) 98.6 F (37 C)  TempSrc: Oral Oral Oral Oral  SpO2: 100% 99% 99% 98%  Weight:   89.5 kg   Height:        Intake/Output Summary (Last 24 hours) at 07/22/2019 1153 Last data filed at 07/22/2019 0900 Gross per 24 hour  Intake 838 ml  Output 3000 ml  Net -2162 ml   Filed Weights   07/20/19 2300 07/21/19 0240 07/22/19 0439  Weight: 89.7 kg 89.6 kg 89.5 kg    Telemetry    Sinus with PVCs - Personally Reviewed  Physical Exam   GEN- The patient is chronically ill appearing, alert and oriented x 3 today.   Head- normocephalic, atraumatic Eyes-  Sclera clear, conjunctiva pink Ears- hearing intact Oropharynx- clear Neck- supple, Lungs-  normal work of breathing Heart- Regular rate and rhythm  GI- soft  Extremities- no clubbing, cyanosis, or edema  MS- no significant deformity or atrophy Skin- no rash or lesion Psych- euthymic mood, full affect Neuro- strength and sensation are intact   Labs    Chemistry Recent Labs  Lab 07/20/19 2214 07/21/19 0548 07/22/19 0818  NA 137  137 135  K 4.3 4.2 3.9  CL 102 102 99  CO2 22 24 23   GLUCOSE 172* 138* 220*  BUN 24* 22 22  CREATININE 0.90 0.84 1.05  CALCIUM 9.0 9.1 9.0  PROT 6.6  --   --   ALBUMIN 3.4*  --   --   AST 38  --   --   ALT 19  --   --   ALKPHOS 74  --   --   BILITOT 1.0  --   --   GFRNONAA >60 >60 >60  GFRAA >60 >60 >60  ANIONGAP 13 11 13      Hematology Recent Labs  Lab 07/20/19 2214  WBC 9.8  RBC 3.96*  HGB 12.2*  HCT 38.1*  MCV 96.2  MCH 30.8  MCHC 32.0  RDW 14.7  PLT 285     Patient ID   64 y.o. male with a history of CAD s/p atherectomy and DES to left main/proximal LAD in 05/2681, chronic systolic CHF/ischemic cardiomyopathy with EF of 20-25% on recent Echo on 07/14/2019, prior CVA, hypertension, hyperlipidemia, and diabetes mellitus. He was recently admitted with CHF exacerbation, diuresed, an discharged on 07/18/2019. Transferred from [facility] yesterday for shortness  of breath. Assessment & Plan    1.  Acute on chronic systolic dysfunction/ CAD/ ischemic CM/ CAD Improving with diuresis No ischemic symptoms  Consider addition of coreg (previously not on coreg due to hypotension but may be worth restarting) Per Dr Kathlyn Sacramento' recent note, would not rechallenge with entresto due to hypotension QRS is narrow (<130 msec),  Could consider barostim activation therapy.  I will discuss consideration for primary prevention ICD on elective outpatient follow-up.  He will need stabilization of his CHF prior to ICD considerations.  I would anticipate discharge to home tomorrow after case management/ PT evaluation.  He is at high risk for readmission for CHF.  Hillis Range MD, [facility] 07/22/2019 11:53 AM

## 2019-07-23 ENCOUNTER — Encounter (HOSPITAL_COMMUNITY): Payer: Medicare Other

## 2019-07-23 LAB — BASIC METABOLIC PANEL
Anion gap: 9 (ref 5–15)
BUN: 23 mg/dL (ref 8–23)
CO2: 29 mmol/L (ref 22–32)
Calcium: 9 mg/dL (ref 8.9–10.3)
Chloride: 98 mmol/L (ref 98–111)
Creatinine, Ser: 1.06 mg/dL (ref 0.61–1.24)
GFR calc Af Amer: >60 mL/min (ref >60)
GFR calc non Af Amer: >60 mL/min (ref >60)
Glucose, Bld: 131 mg/dL — ABNORMAL HIGH (ref 70–99)
Potassium: 4.1 mmol/L (ref 3.5–5.1)
Sodium: 136 mmol/L (ref 135–145)

## 2019-07-23 LAB — GLUCOSE, CAPILLARY
Glucose-Capillary: 121 mg/dL — ABNORMAL HIGH (ref 70–99)
Glucose-Capillary: 120 mg/dL — ABNORMAL HIGH (ref 70–99)
Glucose-Capillary: 176 mg/dL — ABNORMAL HIGH (ref 70–99)
Glucose-Capillary: 160 mg/dL — ABNORMAL HIGH (ref 70–99)

## 2019-07-23 NOTE — Progress Notes (Addendum)
Progress Note  Patient Name: Matthew Mccormick Date of Encounter: 07/23/2019  [facility] Cardiologist: Prentice Docker, MD   Subjective   Denies any chest pain or shortness of breath. He says he became short of breath after being frustrated while trying to open can of food at home. Home aid comes Monday through Saturday from 8AM until 3PM.   Inpatient Medications    Scheduled Meds: . aspirin EC  81 mg Oral Daily  . atorvastatin  40 mg Oral Daily  . clopidogrel  75 mg Oral Daily  . digoxin  0.125 mg Oral Daily  . empagliflozin  10 mg Oral Daily  . insulin detemir  10 Units Subcutaneous BID  . ivabradine  7.5 mg Oral BID WC  . sodium chloride flush  3 mL Intravenous Q12H  . spironolactone  25 mg Oral Daily  . torsemide  40 mg Oral BID   Continuous Infusions: . sodium chloride     PRN Meds: sodium chloride, acetaminophen, ondansetron (ZOFRAN) IV, sodium chloride flush   Vital Signs    Vitals:   07/22/19 1924 07/23/19 0012 07/23/19 0416 07/23/19 0817  BP: 127/79  103/77 114/87  Pulse: 93  79 (!) 104  Resp: 20  18   Temp: 98.6 F (37 C)  97.9 F (36.6 C)   TempSrc: Oral     SpO2: 100%  100% 100%  Weight:  89.6 kg    Height:        Intake/Output Summary (Last 24 hours) at 07/23/2019 0843 Last data filed at 07/23/2019 0835 Gross per 24 hour  Intake 1202 ml  Output 3500 ml  Net -2298 ml   Last 3 Weights 07/23/2019 07/22/2019 07/21/2019  Weight (lbs) 197 lb 8 oz 197 lb 4.8 oz 197 lb 9.6 oz  Weight (kg) 89.585 kg 89.495 kg 89.631 kg      Telemetry    Sinus rhythm with rate PVCs - Personally Reviewed  ECG    Sinus rhythm with poor R wave progression and q wave in the inferior leads - Personally Reviewed  Physical Exam   GEN: No acute distress.   Neck: No JVD Cardiac: RRR, no murmurs, rubs, or gallops.  Respiratory: Clear to auscultation bilaterally. GI: Soft, nontender, non-distended  MS: No edema; No deformity. Neuro:  Nonfocal  Psych: Normal affect     Labs    High Sensitivity Troponin:   Recent Labs  Lab 07/13/19 0939 07/13/19 1226 07/13/19 1928 07/13/19 2117  TROPONINIHS 172* 200* 309* 289*      Chemistry Recent Labs  Lab 07/20/19 2214 07/20/19 2214 07/21/19 0548 07/22/19 0818 07/23/19 0538  NA 137   < > 137 135 136  K 4.3   < > 4.2 3.9 4.1  CL 102   < > 102 99 98  CO2 22   < > 24 23 29   GLUCOSE 172*   < > 138* 220* 131*  BUN 24*   < > 22 22 23   CREATININE 0.90   < > 0.84 1.05 1.06  CALCIUM 9.0   < > 9.1 9.0 9.0  PROT 6.6  --   --   --   --   ALBUMIN 3.4*  --   --   --   --   AST 38  --   --   --   --   ALT 19  --   --   --   --   ALKPHOS 74  --   --   --   --  BILITOT 1.0  --   --   --   --   GFRNONAA >60   < > >60 >60 >60  GFRAA >60   < > >60 >60 >60  ANIONGAP 13   < > 11 13 9    < > = values in this interval not displayed.     Hematology Recent Labs  Lab 07/20/19 2214  WBC 9.8  RBC 3.96*  HGB 12.2*  HCT 38.1*  MCV 96.2  MCH 30.8  MCHC 32.0  RDW 14.7  PLT 285    BNP Recent Labs  Lab 07/20/19 2214  BNP 2,633.9*     DDimer No results for input(s): DDIMER in the last 168 hours.   Radiology    No results found.  Cardiac Studies   Cath 03/22/2019  Mid LM to Prox LAD lesion is 80% stenosed with 65% stenosed side branch in Ramus. Prox LAD to Mid LAD lesion is 70% stenosed.  A drug-eluting stent was successfully placed from the LEFT MAIN into the proximal LAD using a SYNERGY XD 3.0X38. = The stent was upsized to 4.3 mm in the left main and 3.3 mm in the LAD  Post intervention, there is a 0% residual stenosis in the LEFT MAIN and LAD.  Post PTCA intervention, the side branch was reduced to 10% residual stenosis.  Ramus-1 lesion is 70% stenosed. Post PTCA 2.0 mm balloon intervention, there is a 35% residual stenosis.  Ramus-2 lesion is 75% stenosed. This lesion was not successfully crossed. Post intervention, there is a 70% residual stenosis.  1st Diag lesion is 90% stenosed.  Dist  LAD lesion is 99% stenosed. Post intervention (simply with the guidewire advanced to the distal LAD, the vessel recanalized), there is a 80% residual stenosis.  A drug-eluting stent was successfully placed using a SYNERGY XD 3.0X38.   SUMMARY  Successful atherectomy based PCI of LEFT MAIN-PROXIMAL LAD with placement of 3.0 mm x 38 mm Synergy DES stent postdilated to 4.3 mm in the LEFT MAIN and 3.3 mm in the LAD.  Balloon angioplasty only of the ostial and proximal ramus intermedius (unable to pass one 0 mm balloon beyond the lesion)  RECOMMENDATIONS  Patient be transferred back to CCU.  We have applied FemoStop on the left femoral access site which appears to be well controlled.  Plan will be to maintain FemoStop at low pressure for roughly 1 hour and then begin to wean off.  Would consider evaluation and treatment of the RCA lesions at a later date, but would allow for time to recover from this procedure over the weekend.  Given concern for possible blood loss during sheath exchange, we have written for 1 unit transfusion PRBC.   Cath 03/26/2019 Moderate obstructive disease of the proximal/ostial RCA as well as a large posterolateral branch, both lesions interrogated with DFR and are shown to not be hemodynamically significant.  Recommend continued medical therapy.   Patient Profile     64 y.o. male with PMH of HTN, HLD, DM II, prior CVA, CAD s/p PCI in 03/2019 and ICM with baseline EF 30% presented with acute CHF exacerbation and transferred here from [facility]. He was recently discharged for CHF on 6/9.  Assessment & Plan    1. Acute on chronic systolic CHF  - continue ivabradine, digoxin, spironolactone and torsemide  - developed significant hypotension on Entresto.  - euvolemic on exam, plan to discharge today, arrange earlier visit with CHF service given high risk of readmission  2. CAD   -  PCI to LM in 03/2019. Life long aspirin and plavix.   - cath 03/19/2019 showed 3v  CAD, poor candidate for CABG  - cath 03/22/2019 showed 80% lesion extending into LAD treated with Synergy DES, balloon angioplasty to ramus lesion  - cath 03/26/2019 revealed negative DFR for the residual RCA and PLA lesion   3. ICM: EF 20% on Echo 03/14/2019. Echo 07/14/2019 showed EF 20-25%, RVSP 56 mmHg, moderate LAE and RAE, moderate MR.   - EP service to discuss ICD as outpatient  4. HTN  5. HLD: on lipitor  6. DM II  7. Prior CVA with L hemiparesis  8. PAD: moderate RLE disease by ABI       For questions or updates, please contact [facility] Please consult [email or web address] for contact info under        Signed, Azalee Course, PA  07/23/2019, 8:43 AM    Personally seen and examined. Agree with above.   More comfortable in bed.  Less short of breath.  GEN: Well nourished, well developed, in no acute distress  HEENT: normal  Neck: no JVD, carotid bruits, or masses Cardiac: RRR; no murmurs, rubs, or gallops,no edema  Respiratory:  clear to auscultation bilaterally, normal work of breathing GI: soft, nontender, nondistended, + BS MS: no deformity or atrophy  Skin: warm and dry, no rash Neuro:  Alert and Oriented x 3, Strength and sensation are intact Psych: euthymic mood, full affect  Troponin 300 Telemetry normal sinus rhythm Creatinine 1.06  Assessment and plan:  64 year old with acute on chronic systolic heart failure.  Acute on chronic systolic heart failure -Medications as reviewed above.  Has not been able to tolerate Entresto because of hypotension.  Good overall diuresis.  Quick follow-up with heart failure team.  Coronary artery disease -Left main PCI February 2021.  Plavix aspirin.  Ischemic cardiomyopathy -Ejection fraction 25%. -Outpatient discussion for possible ICD EP.  Dr. Johney Frame saw him over the weekend.  Prior stroke with left-sided hemiparesis PAD with moderate right lower extremity disease by ABI-continue to promote exercise.  Okay for  discharge.  Donato Schultz, MD

## 2019-07-23 NOTE — Discharge Summary (Addendum)
Discharge Summary    Patient ID: Matthew Mccormick MRN: [id number]; DOB: 08-09-1955  Admit date: 07/20/2019 Discharge date: 07/23/2019  Primary Care Provider: Kirstie Peri, MD  Primary Cardiologist: Prentice Docker, MD  Primary Electrophysiologist:  None   Discharge Diagnoses    Principal Problem:   Shortness of breath Active Problems:   Diabetes mellitus (HCC)   Hypertension   Coronary artery disease   History of CVA (cerebrovascular accident)   Atrial fibrillation (HCC)   Dyslipidemia   Acute on chronic systolic heart failure [facility])    Diagnostic Studies/Procedures    N/A _____________   History of Present Illness     Matthew Mccormick is a 64 y.o. male with HTN, HLD, DM, prior CVA, CAD s/p recent PCI in February 2021 and ICM (EF ~30%) who presents with shortness of breath. Matthew Mccormick was admitted 6/4-6/9 for shortness of breath and diuresed. He has been doing well at home with assistance from his aids. This morning, he became very frustrated as he was not able to open the cans in his house to be able to eat (states that his aids have been bringing him easy to open cans of food to prepare for himself). He was very frustrated by this and was unsure if an aid would be coming to help today. As he got more angry, he started to feel short of breath and used his call button to phone help. He denies having any fevers, chills, chest pain, palpitations, lightheadedness, dizziness or orthopnea. He feels back to baseline now. He feels that his legs are about the same as they have been. He does not know what his weights have been at home but his aid has been checking this daily.   Hospital Course     Consultants: N/A  Patient was admitted to cardiology service and kept in the hospital for oral diuresis.  He was continued on torsemide 40 mg twice daily along with 20 mg daily of spironolactone.  He was seen in the morning of 07/23/2019, by which time, he was able to put out 4 L of fluid during  this admission.  Discharge weight 197.5 pounds.  Patient has a follow-up with heart failure service tomorrow, unfortunately heart failure service schedule was completely booked for the next month.  I will keep his appointment with CHF service for tomorrow.  He also has a follow-up with Dr. Johney Frame on July 2 to discuss ICD indication for severe LV dysfunction.  _____________  Discharge Vitals Blood pressure 106/74, pulse 86, temperature 98.6 F (37 C), temperature source Oral, resp. rate 16, height  (1.778 m), weight 89.6 kg, SpO2 96 %.  Filed Weights   07/21/19 0240 07/22/19 0439 07/23/19 0012  Weight: 89.6 kg 89.5 kg 89.6 kg    Labs & Radiologic Studies    CBC Recent Labs    07/20/19 2214  WBC 9.8  NEUTROABS 6.4  HGB 12.2*  HCT 38.1*  MCV 96.2  PLT 285   Basic Metabolic Panel Recent Labs    40/98/11 2214 07/21/19 0548 07/22/19 0818 07/23/19 0538  NA 137   < > 135 136  K 4.3   < > 3.9 4.1  CL 102   < > 99 98  CO2 22   < > 23 29  GLUCOSE 172*   < > 220* 131*  BUN 24*   < > 22 23  CREATININE 0.90   < > 1.05 1.06  CALCIUM 9.0   < > 9.0 9.0  MG 1.9  --   --   --    < > = values in this interval not displayed.   Liver Function Tests Recent Labs    07/20/19 2214  AST 38  ALT 19  ALKPHOS 74  BILITOT 1.0  PROT 6.6  ALBUMIN 3.4*   No results for input(s): LIPASE, AMYLASE in the last 72 hours. High Sensitivity Troponin:   Recent Labs  Lab 07/13/19 0939 07/13/19 1226 07/13/19 1928 07/13/19 2117  TROPONINIHS 172* 200* 309* 289*    BNP Invalid input(s): POCBNP D-Dimer No results for input(s): DDIMER in the last 72 hours. Hemoglobin A1C No results for input(s): HGBA1C in the last 72 hours. Fasting Lipid Panel No results for input(s): CHOL, HDL, LDLCALC, TRIG, CHOLHDL, LDLDIRECT in the last 72 hours. Thyroid Function Tests No results for input(s): TSH, T4TOTAL, T3FREE, THYROIDAB in the last 72 hours.  Invalid input(s): FREET3 _____________  DG Chest  2 View  Result Date: 07/21/2019 CLINICAL DATA:  Shortness of breath EXAM: CHEST - 2 VIEW COMPARISON:  07/13/2019 FINDINGS: Cardiopericardial enlargement accentuated by low volumes. Fissure thickening on the lateral view with generalized interstitial prominence. No definite Kerley lines, effusion, or consolidation. Coronary stent noted on the lateral view IMPRESSION: Cardiomegaly and borderline edema. Electronically Signed   By: Marnee Spring M.D.   On: 07/21/2019 07:46   DG Chest 2 View  Result Date: 07/13/2019 CLINICAL DATA:  Shortness of breath, CHF, generalized weakness pain from LEFT shoulder down to leg, ankle swelling EXAM: CHEST - 2 VIEW COMPARISON:  07/08/2019 FINDINGS: Enlargement of cardiac silhouette with pulmonary vascular congestion. Mediastinal contours normal. Tiny pleural effusion blunts lateral LEFT costophrenic angle. Lungs otherwise clear. No infiltrate or pneumothorax. Old fractures of lateral RIGHT mid ribs. IMPRESSION: Enlargement of cardiac silhouette with slight pulmonary vascular congestion. Tiny LEFT pleural effusion without pulmonary infiltrate. Electronically Signed   By: Ulyses Southward M.D.   On: 07/13/2019 10:11   CT Angio Chest PE W/Cm &/Or Wo Cm  Result Date: 07/13/2019 CLINICAL DATA:  Generalized weakness, shortness of breath EXAM: CT ANGIOGRAPHY CHEST WITH CONTRAST TECHNIQUE: Multidetector CT imaging of the chest was performed using the standard protocol during bolus administration of intravenous contrast. Multiplanar CT image reconstructions and MIPs were obtained to evaluate the vascular anatomy. CONTRAST:  OMNIPAQUE IOHEXOL 350 MG/ML SOLN COMPARISON:  07/13/2019, 03/13/2019 FINDINGS: Cardiovascular: This is a technically adequate evaluation of the central and segmental branches of the pulmonary vasculature. The subsegmental branches at the lung bases are incompletely evaluated due to timing of contrast bolus. There are no filling defects or pulmonary emboli. The  heart is enlarged without pericardial effusion. Extensive atherosclerosis of the coronary vasculature is again noted. Minimal atherosclerosis of the aortic arch unchanged. Mediastinum/Nodes: No enlarged mediastinal, hilar, or axillary lymph nodes. Thyroid gland, trachea, and esophagus demonstrate no significant findings. Lungs/Pleura: There are small bilateral pleural effusions volume estimated less than 500 cc each. Minimal dependent atelectasis. No acute airspace disease. No pneumothorax. The central airways are patent. Upper Abdomen: There is trace free fluid surrounding the dome of the liver. Otherwise the upper abdomen is unremarkable. Musculoskeletal: There are prior healed bilateral rib fractures. No acute or destructive bony lesions. Reconstructed images demonstrate no additional findings. Review of the MIP images confirms the above findings. IMPRESSION: 1. No evidence of pulmonary embolus. 2. Small bilateral pleural effusions. 3. Trace ascites surrounding the dome of the liver. 4. Aortic Atherosclerosis (ICD10-I70.0). Electronically Signed   By: Maxwell Caul.D.  On: 07/13/2019 16:29   ECHOCARDIOGRAM COMPLETE  Result Date: 07/14/2019    ECHOCARDIOGRAM REPORT   Patient Name:   Matthew Mccormick Date of Exam: 07/14/2019 Medical Rec #:  [id number]      Height:       72.0 in Accession #:    [id number]     Weight:       206.0 lb Date of Birth:  12/06/1955       BSA:          2.157 m Patient Age:    63 years       BP:           104/72 mmHg Patient Gender: M              HR:           81 bpm. Exam Location:  Inpatient Procedure: 2D Echo, Cardiac Doppler, Color Doppler and Intracardiac            Opacification Agent Indications:    I50.31 Acute diastolic (congestive) heart failure  History:        Patient has prior history of Echocardiogram examinations, most                 recent 03/14/2019.  Sonographer:    [name] RDCS Referring Phys: [id number] DALTON S MCLEAN IMPRESSIONS  1. Left ventricular ejection fraction, by  estimation, is 20 to 25%. The left ventricle has severely decreased function. The left ventricle demonstrates global hypokinesis. Left ventricular diastolic parameters are indeterminate.  2. Right ventricular systolic function is normal. The right ventricular size is mildly enlarged. There is moderately elevated pulmonary artery systolic pressure. The estimated right ventricular systolic pressure is 56.0 mmHg.  3. Left atrial size was moderately dilated.  4. Right atrial size was moderately dilated.  5. The mitral valve is normal in structure. Moderate mitral valve regurgitation. No evidence of mitral stenosis.  6. Tricuspid valve regurgitation is moderate.  7. The aortic valve is normal in structure. Aortic valve regurgitation is not visualized. No aortic stenosis is present.  8. The inferior vena cava is dilated in size with <50% respiratory variability, suggesting right atrial pressure of 15 mmHg. Comparison(s): No significant change from prior study. Prior images reviewed side by side. FINDINGS  Left Ventricle: Left ventricular ejection fraction, by estimation, is 20 to 25%. The left ventricle has severely decreased function. The left ventricle demonstrates global hypokinesis. Definity contrast agent was given IV to delineate the left ventricular endocardial borders. The left ventricular internal cavity size was normal in size. There is no left ventricular hypertrophy. Left ventricular diastolic parameters are indeterminate. Right Ventricle: The right ventricular size is mildly enlarged. No increase in right ventricular wall thickness. Right ventricular systolic function is normal. There is moderately elevated pulmonary artery systolic pressure. The tricuspid regurgitant velocity is 3.39 m/s, and with an assumed right atrial pressure of 10 mmHg, the estimated right ventricular systolic pressure is 56.0 mmHg. Left Atrium: Left atrial size was moderately dilated. Right Atrium: Right atrial size was moderately  dilated. Pericardium: There is no evidence of pericardial effusion. Mitral Valve: The mitral valve is normal in structure. Normal mobility of the mitral valve leaflets. Moderate mitral valve regurgitation. No evidence of mitral valve stenosis. Tricuspid Valve: The tricuspid valve is normal in structure. Tricuspid valve regurgitation is moderate . No evidence of tricuspid stenosis. Aortic Valve: The aortic valve is normal in structure. Aortic valve regurgitation is not visualized. No aortic stenosis is present. Pulmonic  Valve: The pulmonic valve was normal in structure. Pulmonic valve regurgitation is not visualized. No evidence of pulmonic stenosis. Aorta: The aortic root is normal in size and structure. Venous: The inferior vena cava is dilated in size with less than 50% respiratory variability, suggesting right atrial pressure of 15 mmHg. IAS/Shunts: No atrial level shunt detected by color flow Doppler.  LEFT VENTRICLE PLAX 2D LVIDd:         5.70 cm      Diastology LVIDs:         5.00 cm      LV e' lateral:   8.03 cm/s LV PW:         1.00 cm      LV E/e' lateral: 14.2 LV IVS:        0.90 cm      LV e' medial:    3.75 cm/s LVOT diam:     2.00 cm      LV E/e' medial:  30.4 LV SV:         18 LV SV Index:   8 LVOT Area:     3.14 cm  LV Volumes (MOD) LV vol d, MOD A2C: 162.0 ml LV vol d, MOD A4C: 206.0 ml LV vol s, MOD A2C: 126.0 ml LV vol s, MOD A4C: 164.0 ml LV SV MOD A2C:     36.0 ml LV SV MOD A4C:     206.0 ml LV SV MOD BP:      39.6 ml RIGHT VENTRICLE             IVC RV Basal diam:  4.90 cm     IVC diam: 2.30 cm RV Mid diam:    3.70 cm RV S prime:     10.20 cm/s TAPSE (M-mode): 0.9 cm LEFT ATRIUM              Index       RIGHT ATRIUM           Index LA diam:        5.30 cm  2.46 cm/m  RA Area:     19.20 cm LA Vol (A2C):   124.0 ml 57.47 ml/m RA Volume:   57.30 ml  26.56 ml/m LA Vol (A4C):   92.3 ml  42.78 ml/m LA Biplane Vol: 108.0 ml 50.06 ml/m  AORTIC VALVE LVOT Vmax:   40.40 cm/s LVOT Vmean:  27.200 cm/s  LVOT VTI:    0.057 m  AORTA Ao Root diam: 3.20 cm Ao Asc diam:  3.10 cm MITRAL VALVE                 TRICUSPID VALVE MV Area (PHT): 7.44 cm      TR Peak grad:   46.0 mmHg MV Decel Time: 102 msec      TR Vmax:        339.00 cm/s MR Peak grad:    66.1 mmHg MR Mean grad:    39.5 mmHg   SHUNTS MR Vmax:         406.50 cm/s Systemic VTI:  0.06 m MR Vmean:        290.0 cm/s  Systemic Diam: 2.00 cm MR PISA:         1.57 cm MR PISA Eff ROA: 12 mm MR PISA Radius:  0.50 cm MV E velocity: 114.00 cm/s MV A velocity: 31.50 cm/s MV E/A ratio:  3.62 Donato Schultz MD Electronically signed by Donato Schultz MD Signature Date/Time: 07/14/2019/1:22:27 PM  Final    Disposition   Pt is being discharged home today in good condition.  Follow-up Plans & Appointments     Follow-up Information    Care, [facility] Follow up.   Why: for home health services Contact information: [address] [place] [address] [phone]        [facility] Follow up on 07/24/2019.   Specialty: Cardiology Why: 12:00PM. Cardiology followup Contact information: [address] [id number] mc [address] [place] [address] [phone]       Hillis Range, MD Follow up on 08/10/2019.   Specialty: Cardiology Why: Electrophysiology visit. 10:30AM.  Contact information: [address] Ervin Knack [place] [address] [phone]              Discharge Instructions    Diet - low sodium heart healthy   Complete by: As directed    No wound care   Complete by: As directed       Discharge Medications   Allergies as of 07/23/2019   No Known Allergies     Medication List    TAKE these medications   aspirin EC 81 MG tablet Take 81 mg by mouth daily.   atorvastatin 40 MG tablet Commonly known as: LIPITOR Take 40 mg by mouth daily.   clopidogrel 75 MG tablet Commonly known as: PLAVIX Take 75 mg by mouth daily.   digoxin 0.125 MG tablet Commonly  known as: LANOXIN Take 1 tablet (0.125 mg total) by mouth daily.   empagliflozin 10 MG Tabs tablet Commonly known as: JARDIANCE Take 10 mg by mouth daily.   ibuprofen 200 MG tablet Commonly known as: ADVIL Take 200 mg by mouth every 6 (six) hours as needed for headache.   insulin detemir 100 UNIT/ML injection Commonly known as: LEVEMIR Inject 0.2 mLs (20 Units total) into the skin 2 (two) times daily. What changed:   how much to take  when to take this  additional instructions   ivabradine 7.5 MG Tabs tablet Commonly known as: CORLANOR Take 1 tablet (7.5 mg total) by mouth 2 (two) times daily with a meal.   metFORMIN 1000 MG tablet Commonly known as: GLUCOPHAGE Take 1,000 mg by mouth 2 (two) times daily with a meal.   spironolactone 25 MG tablet Commonly known as: ALDACTONE Take 1 tablet (25 mg total) by mouth daily.   torsemide 20 MG tablet Commonly known as: Demadex Take 2 tablets (40 mg total) by mouth 2 (two) times daily.          Outstanding Labs/Studies   N/A  Duration of Discharge Encounter   Greater than 30 minutes including physician time.  Ramond Dial, PA 07/23/2019, 12:39 PM  Personally seen and examined. Agree with above.   More comfortable in bed.  Less short of breath.  GEN: Well nourished, well developed, in no acute distress  HEENT: normal  Neck: no JVD, carotid bruits, or masses Cardiac: RRR; no murmurs, rubs, or gallops,no edema  Respiratory:  clear to auscultation bilaterally, normal work of breathing GI: soft, nontender, nondistended, + BS MS: no deformity or atrophy  Skin: warm and dry, no rash Neuro:  Alert and Oriented x 3, Strength and sensation are intact Psych: euthymic mood, full affect  Troponin 300 Telemetry normal sinus rhythm Creatinine 1.06  Assessment and plan:  64 year old with acute on chronic systolic heart failure.  Acute on chronic systolic heart failure -Medications as reviewed above.  Has  not  been able to tolerate Entresto because of hypotension.  Good overall diuresis.  Quick follow-up with heart failure team.  Coronary artery disease -Left main PCI February 2021.  Plavix aspirin.  Ischemic cardiomyopathy -Ejection fraction 25%. -Outpatient discussion for possible ICD EP.  Dr. Rayann Heman saw him over the weekend.  Prior stroke with left-sided hemiparesis PAD with moderate right lower extremity disease by ABI-continue to promote exercise.  Okay for discharge.  Candee Furbish, MD

## 2019-07-23 NOTE — TOC Transition Note (Signed)
Transition of Care [facility]) - CM/SW Discharge Note Donn Pierini RN, BSN Transitions of Care Unit 4E- RN Case Manager [phone] 3E cross coverage   Patient Details  Name: Matthew Mccormick MRN: [id number] Date of Birth: Jul 03, 1955  Transition of Care [facility]) CM/SW Contact:  Matthew Span, RN Phone Number: 07/23/2019, 12:58 PM   Clinical Narrative:    Pt medically stable for transition home, HH orders have been placed for resumption of HHRN/PT- spoke with Matthew Mccormick at [facility] for resumption start of care- they plan to see pt on 6/15     Barriers to Discharge: Barriers Resolved   Patient Goals and CMS Choice Patient states their goals for this hospitalization and ongoing recovery are:: to go home CMS Medicare.gov Compare Post Acute Care list provided to:: Other (Comment Required) Choice offered to / list presented to : NA  Discharge Placement               Home with [facility]        Discharge Plan and Services   Discharge Planning Services: CM Consult Post Acute Care Choice: Resumption of Svcs/PTA Provider                      [facility] Agency: [facility] Date [facility] Agency Contacted: 07/22/19 Time HH Agency Contacted: 1527 Representative spoke with at [facility] Agency: cheryl  Social Determinants of Health (SDOH) Interventions     Readmission Risk Interventions Readmission Risk Prevention Plan 07/23/2019  Post Dischage Appt Complete  Medication Screening Complete  Transportation Screening Complete  Some recent data might be hidden

## 2019-07-23 NOTE — Progress Notes (Signed)
Pharmacist Heart Failure Core Measure Documentation  Assessment: Matthew Mccormick has an EF documented as 20-25% on 07/14/19 by ECHO.  Rationale: Heart failure patients with left ventricular systolic dysfunction (LVSD) and an EF < 40% should be prescribed an angiotensin converting enzyme inhibitor (ACEI) or angiotensin receptor blocker (ARB) at discharge unless a contraindication is documented in the medical record.  This patient is not currently on an ACEI or ARB for HF.  This note is being placed in the record in order to provide documentation that a contraindication to the use of these agents is present for this encounter.  ACE Inhibitor or Angiotensin Receptor Blocker is contraindicated (specify all that apply)  []   ACEI allergy AND ARB allergy []   Angioedema []   Moderate or severe aortic stenosis []   Hyperkalemia [x]   Hypotension []   Renal artery stenosis []   Worsening renal function, preexisting renal disease or dysfunction   Matthew Winnick D. , PharmD, BCPS, BCCCP 07/23/2019, 1:50 PM

## 2019-07-24 ENCOUNTER — Encounter (HOSPITAL_COMMUNITY): Payer: Medicare Other

## 2019-07-24 ENCOUNTER — Other Ambulatory Visit: Payer: Self-pay | Admitting: *Deleted

## 2019-07-24 DIAGNOSIS — Z794 Long term (current) use of insulin: Secondary | ICD-10-CM | POA: Diagnosis not present

## 2019-07-24 DIAGNOSIS — E785 Hyperlipidemia, unspecified: Secondary | ICD-10-CM | POA: Diagnosis not present

## 2019-07-24 DIAGNOSIS — I509 Heart failure, unspecified: Secondary | ICD-10-CM | POA: Diagnosis not present

## 2019-07-24 DIAGNOSIS — I110 Hypertensive heart disease with heart failure: Secondary | ICD-10-CM | POA: Diagnosis not present

## 2019-07-24 DIAGNOSIS — E1151 Type 2 diabetes mellitus with diabetic peripheral angiopathy without gangrene: Secondary | ICD-10-CM | POA: Diagnosis not present

## 2019-07-24 DIAGNOSIS — I69354 Hemiplegia and hemiparesis following cerebral infarction affecting left non-dominant side: Secondary | ICD-10-CM | POA: Diagnosis not present

## 2019-07-24 DIAGNOSIS — I428 Other cardiomyopathies: Secondary | ICD-10-CM | POA: Diagnosis not present

## 2019-07-24 DIAGNOSIS — Z7902 Long term (current) use of antithrombotics/antiplatelets: Secondary | ICD-10-CM | POA: Diagnosis not present

## 2019-07-24 DIAGNOSIS — I25119 Atherosclerotic heart disease of native coronary artery with unspecified angina pectoris: Secondary | ICD-10-CM | POA: Diagnosis not present

## 2019-07-25 NOTE — Patient Outreach (Signed)
[facility]) Care Management  07/25/2019  SHAWAN CORELLA [birth year] [id number]  PAC follow up. No answer. Will call again on Thursday.   Zara Council. Burgess Estelle, MSN, [facility] Gerontological Nurse Practitioner [facility] Care Management [phone]

## 2019-07-26 ENCOUNTER — Other Ambulatory Visit: Payer: Self-pay | Admitting: *Deleted

## 2019-07-26 DIAGNOSIS — I110 Hypertensive heart disease with heart failure: Secondary | ICD-10-CM | POA: Diagnosis not present

## 2019-07-26 DIAGNOSIS — E1151 Type 2 diabetes mellitus with diabetic peripheral angiopathy without gangrene: Secondary | ICD-10-CM | POA: Diagnosis not present

## 2019-07-26 DIAGNOSIS — I69354 Hemiplegia and hemiparesis following cerebral infarction affecting left non-dominant side: Secondary | ICD-10-CM | POA: Diagnosis not present

## 2019-07-26 DIAGNOSIS — I25119 Atherosclerotic heart disease of native coronary artery with unspecified angina pectoris: Secondary | ICD-10-CM | POA: Diagnosis not present

## 2019-07-26 DIAGNOSIS — I428 Other cardiomyopathies: Secondary | ICD-10-CM | POA: Diagnosis not present

## 2019-07-26 DIAGNOSIS — I509 Heart failure, unspecified: Secondary | ICD-10-CM | POA: Diagnosis not present

## 2019-07-26 NOTE — Patient Outreach (Addendum)
[facility]) Care Management  07/26/2019  FACUNDO ALLEMAND [birth year] [id number]   Transition of care call, attempt #2.  Today, I was able to speak with Mr. Fedewa who told me is feeling good today. He denies any problems. He is watching TV.  Spoke with his new CAP worker, Barbaraann Cao. She verifies that pt appears well. He is not SOB or wheezing. He has what she describes as 2+ LE edema. Weight today:  187.8     Talked with pt's second cousin, Clarnce Flock (daughter of Ebbie Latus). She reports she has visited Mr. Geter daily to ensure he is improving which she reports today.    Patient was recently discharged from hospital and all medications have been reviewed. Outpatient Encounter Medications as of 07/26/2019  Medication Sig  . aspirin EC 81 MG tablet Take 81 mg by mouth daily.  Marland Kitchen atorvastatin (LIPITOR) 40 MG tablet Take 40 mg by mouth daily.  . clopidogrel (PLAVIX) 75 MG tablet Take 75 mg by mouth daily.   . digoxin (LANOXIN) 0.125 MG tablet Take 1 tablet (0.125 mg total) by mouth daily.  . empagliflozin (JARDIANCE) 10 MG TABS tablet Take 10 mg by mouth daily.   Marland Kitchen ibuprofen (ADVIL) 200 MG tablet Take 200 mg by mouth every 6 (six) hours as needed for headache.  . insulin detemir (LEVEMIR) 100 UNIT/ML injection Inject 0.2 mLs (20 Units total) into the skin 2 (two) times daily. (Patient taking differently: Inject 15-20 Units into the skin See admin instructions. Taking 20 units in The AM and 15 units in the PM)  . ivabradine (CORLANOR) 7.5 MG TABS tablet Take 1 tablet (7.5 mg total) by mouth 2 (two) times daily with a meal.  . metFORMIN (GLUCOPHAGE) 1000 MG tablet Take 1,000 mg by mouth 2 (two) times daily with a meal.  . spironolactone (ALDACTONE) 25 MG tablet Take 1 tablet (25 mg total) by mouth daily.  Marland Kitchen torsemide (DEMADEX) 20 MG tablet Take 2 tablets (40 mg total) by mouth 2 (two) times daily.   No facility-administered encounter medications on file as of  07/26/2019.   Pt has had follow up with cardiology and will have additional follow up with cardiology on 08/10/19.   Discussed need to consider LTC, with his cousin, considering his increasing health needs and pt being unable to resolve any problem by himself when he is alone.  Will check in on pt and with Ebbie Latus next week.  Zara Council. Burgess Estelle, MSN, [facility] Gerontological Nurse Practitioner [facility] Care Management [phone]

## 2019-07-27 ENCOUNTER — Encounter: Payer: Self-pay | Admitting: *Deleted

## 2019-07-27 ENCOUNTER — Ambulatory Visit: Payer: Self-pay | Admitting: *Deleted

## 2019-07-27 DIAGNOSIS — I25119 Atherosclerotic heart disease of native coronary artery with unspecified angina pectoris: Secondary | ICD-10-CM | POA: Diagnosis not present

## 2019-07-27 DIAGNOSIS — I428 Other cardiomyopathies: Secondary | ICD-10-CM | POA: Diagnosis not present

## 2019-07-27 DIAGNOSIS — E1151 Type 2 diabetes mellitus with diabetic peripheral angiopathy without gangrene: Secondary | ICD-10-CM | POA: Diagnosis not present

## 2019-07-27 DIAGNOSIS — I509 Heart failure, unspecified: Secondary | ICD-10-CM | POA: Diagnosis not present

## 2019-07-27 DIAGNOSIS — I110 Hypertensive heart disease with heart failure: Secondary | ICD-10-CM | POA: Diagnosis not present

## 2019-07-27 DIAGNOSIS — I69354 Hemiplegia and hemiparesis following cerebral infarction affecting left non-dominant side: Secondary | ICD-10-CM | POA: Diagnosis not present

## 2019-07-31 DIAGNOSIS — E1165 Type 2 diabetes mellitus with hyperglycemia: Secondary | ICD-10-CM | POA: Diagnosis not present

## 2019-07-31 DIAGNOSIS — I739 Peripheral vascular disease, unspecified: Secondary | ICD-10-CM | POA: Diagnosis not present

## 2019-07-31 DIAGNOSIS — I10 Essential (primary) hypertension: Secondary | ICD-10-CM | POA: Diagnosis not present

## 2019-07-31 DIAGNOSIS — Z299 Encounter for prophylactic measures, unspecified: Secondary | ICD-10-CM | POA: Diagnosis not present

## 2019-07-31 DIAGNOSIS — I5022 Chronic systolic (congestive) heart failure: Secondary | ICD-10-CM | POA: Diagnosis not present

## 2019-08-01 DIAGNOSIS — E1151 Type 2 diabetes mellitus with diabetic peripheral angiopathy without gangrene: Secondary | ICD-10-CM | POA: Diagnosis not present

## 2019-08-01 DIAGNOSIS — I428 Other cardiomyopathies: Secondary | ICD-10-CM | POA: Diagnosis not present

## 2019-08-01 DIAGNOSIS — I110 Hypertensive heart disease with heart failure: Secondary | ICD-10-CM | POA: Diagnosis not present

## 2019-08-01 DIAGNOSIS — I25119 Atherosclerotic heart disease of native coronary artery with unspecified angina pectoris: Secondary | ICD-10-CM | POA: Diagnosis not present

## 2019-08-01 DIAGNOSIS — I69354 Hemiplegia and hemiparesis following cerebral infarction affecting left non-dominant side: Secondary | ICD-10-CM | POA: Diagnosis not present

## 2019-08-01 DIAGNOSIS — I509 Heart failure, unspecified: Secondary | ICD-10-CM | POA: Diagnosis not present

## 2019-08-02 ENCOUNTER — Other Ambulatory Visit: Payer: Self-pay | Admitting: *Deleted

## 2019-08-02 ENCOUNTER — Encounter: Payer: Self-pay | Admitting: *Deleted

## 2019-08-02 DIAGNOSIS — I959 Hypotension, unspecified: Secondary | ICD-10-CM | POA: Diagnosis not present

## 2019-08-02 DIAGNOSIS — R1111 Vomiting without nausea: Secondary | ICD-10-CM | POA: Diagnosis not present

## 2019-08-02 NOTE — Patient Outreach (Signed)
[facility]) Care Management  08/02/2019  WILLIAM SCHAKE 1955/04/09 [id number]   Erroneous Encounter.  Danford Bad, BSW, MSW, LCSW  Licensed Restaurant manager, fast food [facility]  Mailing [place] [address], [place], [place] [address] Physical Address-[address], [place], [place] [address] Toll Free Main # [phone] Fax # [phone] Cell # [phone]  Office # [phone] [email or web address]

## 2019-08-02 NOTE — Patient Outreach (Signed)
[facility]) Care Management  08/02/2019  Matthew Mccormick 1955-05-06 [id number]  Care coordination call with pt's HCPOA, Matthew Mccormick.   Discussed pt's recent frequent trips to the ED and need for transition to supervised setting. Mrs. Matthew Mccormick is in agreement and gives permission for moving forward on placement. She has discussed this with Matthew Mccormick.  She would like him to go to [facility] in [place]. She would agree to him going to the [facility] if needed until a bed is available at [facility]. Mrs. Matthew Mccormick is leaving the area again for vacation tomorrow but says her daughter will be able to do anything she can do.  Advised assigned LCSW, Danford Bad, that placement is needed ASAP and details of pt disposition. NP to work with LCSW for this placement.  Zara Council. Burgess Estelle, MSN, [facility] Gerontological Nurse Practitioner [facility] Care Management [phone]

## 2019-08-03 ENCOUNTER — Other Ambulatory Visit: Payer: Self-pay | Admitting: *Deleted

## 2019-08-03 ENCOUNTER — Encounter: Payer: Self-pay | Admitting: *Deleted

## 2019-08-03 NOTE — Patient Outreach (Signed)
[facility]) Care Management  08/03/2019  ZYIERE ROSEMOND [birth year] [id number]   CSW was able to make initial contact with patient's cousin and Healthcare Power of Oneta Rack, [name] today to perform the phone assessment on patient, as well as assess and assist with social work needs and services.  CSW introduced self, explained role and types of services provided through [facility] ([facility]).  CSW further explained to Mrs. Eulas Post that CSW works with patient's Geriatric Designer, jewellery, also with [facility], Deloria Lair.  CSW then explained the reason for the call, indicating that Ms. Spinks thought that patient would benefit from social work services and resources to assist with long-term care placement into an assisted living facility.  CSW obtained two HIPAA compliant identifiers from Mrs. Eulas Post, which included patient's name and date of birth.  Mrs. Eulas Post admitted that she is very concerned about patient staying at home alone, for any length of time, as patient has presented to the Emergency Department 7 different times, just within the last 30 days, and it's typically right after his CAPS Forensic scientist) Worker leaves.  Mrs. Eulas Post believes that patient becomes very anxious when left at home alone, causing him to become increasingly confused, shaky and short of breath, which results in patient pressing his Life Alert button, signalling EMS (Emergency Medical Services).  Mrs. Eulas Post went on to say that patient is not eating well, is sleeping a lot more and is now requiring more assistance with activities of daily living.  Patient receives CAPS through [facility], courtesy of the [place].  Patient's Adult Medicaid benefit pays for patient to receive CAPS for the maximum amount of hours allocated, which is 180 hours per month.  Patient's CAPS Worker is  with patient from 8:00am until 3:00pm Monday through Friday, from 8:00am until 1:00pm on Saturday and from 11:00am until 4:00pm on Sunday.  Patient currently lives in [place], residing in the same handicapped accessible apartment for the past 25 years.  Patient has developmental delays, but appears to be well-adjusted to his surroundings, making it difficult for Mrs. Eulas Post to make the decision to place patient into a long-term care assisted living facility.  Mrs. Eulas Post admitted that she is not going to tell patient that placement is long-term, not wanting to discourage patient from thinking that he can ever live independently again.  Mrs. Eulas Post reported that she is interested in placing patient at [facility], [place] of choice, requesting that CSW outreach to the admissions coordinator at the facility to check bed availability.  CSW voiced understanding, also agreeing to fax patient's completed and signed FL-2 Form, once received back from patient's Primary Care Physician, Dr. Monico Blitz.  CSW explained to Mrs. Eulas Post that she will need to contact patient's Adult Medicaid Case Worker, Lindon Romp with the [place] [place], to change patient's Adult Medicaid coverage to Special Assistance Long-Term Care Medicaid.  Mrs. Eulas Post will also need to cancel patient's Food Stamps and meal delivery, through [employer], through [employer] [facility] [place].    CSW further explained to Mrs. Eulas Post that [facility] will require patient to provide proof of a negative COVID-19 Screening, within three days of admission into their facility.  In addition, patient will need to provide copies of his most recent Chest X-ray, taken with the last year, or have a TB Skin Test administered, to show  that patient has never been diagnosed with Tuberculosis.  Mrs. Montez Morita admitted that she will be out-of-town until Saturday, August 18, 2019,  flying to [place] today to attend her son's wedding, encouraging CSW to follow-up with her daughter, Clarnce Flock, in her absence.  CSW will contact Mrs. Webster as soon as CSW is able to fax patient's FL-2 Form to [facility] [place], or next week, on Thursday, August 09, 2019, around 10:00am, whichever comes first.  Mrs. Montez Morita was very much in agreement with this plan.  Danford Bad, BSW, MSW, LCSW  Licensed Restaurant manager, [employer] food [facility]  Mailing [place] [address], [place], [place] [address] Physical Address-[address], [place], [place] [address] Toll Free Main # [phone] Fax # [phone] Cell # [phone]  Office # [phone] [email or web address]

## 2019-08-04 DIAGNOSIS — R570 Cardiogenic shock: Secondary | ICD-10-CM | POA: Diagnosis not present

## 2019-08-04 DIAGNOSIS — I469 Cardiac arrest, cause unspecified: Secondary | ICD-10-CM | POA: Diagnosis not present

## 2019-08-04 DIAGNOSIS — R9431 Abnormal electrocardiogram [ECG] [EKG]: Secondary | ICD-10-CM | POA: Diagnosis not present

## 2019-08-04 DIAGNOSIS — N179 Acute kidney failure, unspecified: Secondary | ICD-10-CM | POA: Diagnosis not present

## 2019-08-04 DIAGNOSIS — I429 Cardiomyopathy, unspecified: Secondary | ICD-10-CM | POA: Diagnosis not present

## 2019-08-04 DIAGNOSIS — A419 Sepsis, unspecified organism: Secondary | ICD-10-CM | POA: Diagnosis not present

## 2019-08-04 DIAGNOSIS — Z8673 Personal history of transient ischemic attack (TIA), and cerebral infarction without residual deficits: Secondary | ICD-10-CM | POA: Diagnosis not present

## 2019-08-04 DIAGNOSIS — R6521 Severe sepsis with septic shock: Secondary | ICD-10-CM | POA: Diagnosis not present

## 2019-08-04 DIAGNOSIS — Z20822 Contact with and (suspected) exposure to covid-19: Secondary | ICD-10-CM | POA: Diagnosis not present

## 2019-08-04 DIAGNOSIS — I110 Hypertensive heart disease with heart failure: Secondary | ICD-10-CM | POA: Diagnosis not present

## 2019-08-04 DIAGNOSIS — T68XXXA Hypothermia, initial encounter: Secondary | ICD-10-CM | POA: Diagnosis not present

## 2019-08-04 DIAGNOSIS — I5042 Chronic combined systolic (congestive) and diastolic (congestive) heart failure: Secondary | ICD-10-CM | POA: Diagnosis not present

## 2019-08-04 DIAGNOSIS — I959 Hypotension, unspecified: Secondary | ICD-10-CM | POA: Diagnosis not present

## 2019-08-04 DIAGNOSIS — E869 Volume depletion, unspecified: Secondary | ICD-10-CM | POA: Diagnosis not present

## 2019-08-04 DIAGNOSIS — M6282 Rhabdomyolysis: Secondary | ICD-10-CM | POA: Diagnosis not present

## 2019-08-04 DIAGNOSIS — E119 Type 2 diabetes mellitus without complications: Secondary | ICD-10-CM | POA: Diagnosis not present

## 2019-08-09 ENCOUNTER — Encounter: Payer: Self-pay | Admitting: *Deleted

## 2019-08-09 ENCOUNTER — Other Ambulatory Visit: Payer: Self-pay | Admitting: *Deleted

## 2019-08-09 ENCOUNTER — Ambulatory Visit: Payer: Self-pay | Admitting: *Deleted

## 2019-08-09 NOTE — Patient Outreach (Signed)
[facility]) Care Management  08/09/2019  Matthew Mccormick 1955/07/13 [id number]   CSW contacted Dr. Margaretmary Eddy office and spoke with his nurse to inquire about patient's FL-2 Form, learning that patient was deceased.  CSW then notified patient's Geriatric Nurse Practitioner, also with [facility], Almetta Lovely, of patient's demise.    Danford Bad, BSW, MSW, LCSW  Licensed Restaurant manager, fast food [facility]  [address] [place] [address], [place], [place] [address] Physical Address[address], [place], [place] [address] Toll Free Main # [phone] Fax # [phone] Cell # [phone]  Office # [phone] [email or web address]

## 2019-08-09 NOTE — Patient Outreach (Signed)
[facility]) Care Management  08/09/2019  Matthew Mccormick 09-Mar-1955 [id number]  Advised that [name] passed away 09/03/2019.  Sent messages to other providers to notify of his passing.  Zara Council. Burgess Estelle, MSN, [facility] Gerontological Nurse Practitioner [facility] Care Management [phone]

## 2019-08-09 DEATH — deceased

## 2019-08-10 ENCOUNTER — Institutional Professional Consult (permissible substitution): Payer: Medicare Other | Admitting: Internal Medicine

## 2019-08-10 DIAGNOSIS — E1151 Type 2 diabetes mellitus with diabetic peripheral angiopathy without gangrene: Secondary | ICD-10-CM | POA: Diagnosis not present

## 2019-08-10 DIAGNOSIS — I110 Hypertensive heart disease with heart failure: Secondary | ICD-10-CM | POA: Diagnosis not present

## 2019-08-10 DIAGNOSIS — I25119 Atherosclerotic heart disease of native coronary artery with unspecified angina pectoris: Secondary | ICD-10-CM | POA: Diagnosis not present

## 2019-08-10 DIAGNOSIS — I509 Heart failure, unspecified: Secondary | ICD-10-CM | POA: Diagnosis not present

## 2019-08-27 ENCOUNTER — Encounter (HOSPITAL_COMMUNITY): Payer: Medicare Other | Admitting: Internal Medicine

## 2019-08-27 ENCOUNTER — Other Ambulatory Visit (HOSPITAL_COMMUNITY): Payer: Medicare Other

## 2021-01-13 IMAGING — DX DG CHEST 2V
2 series · 2 of 2 positions shown · non-contrast
Comparison: 07/13/2019

CLINICAL DATA: Shortness of breath

EXAM:
CHEST - 2 VIEW

[chest lat]
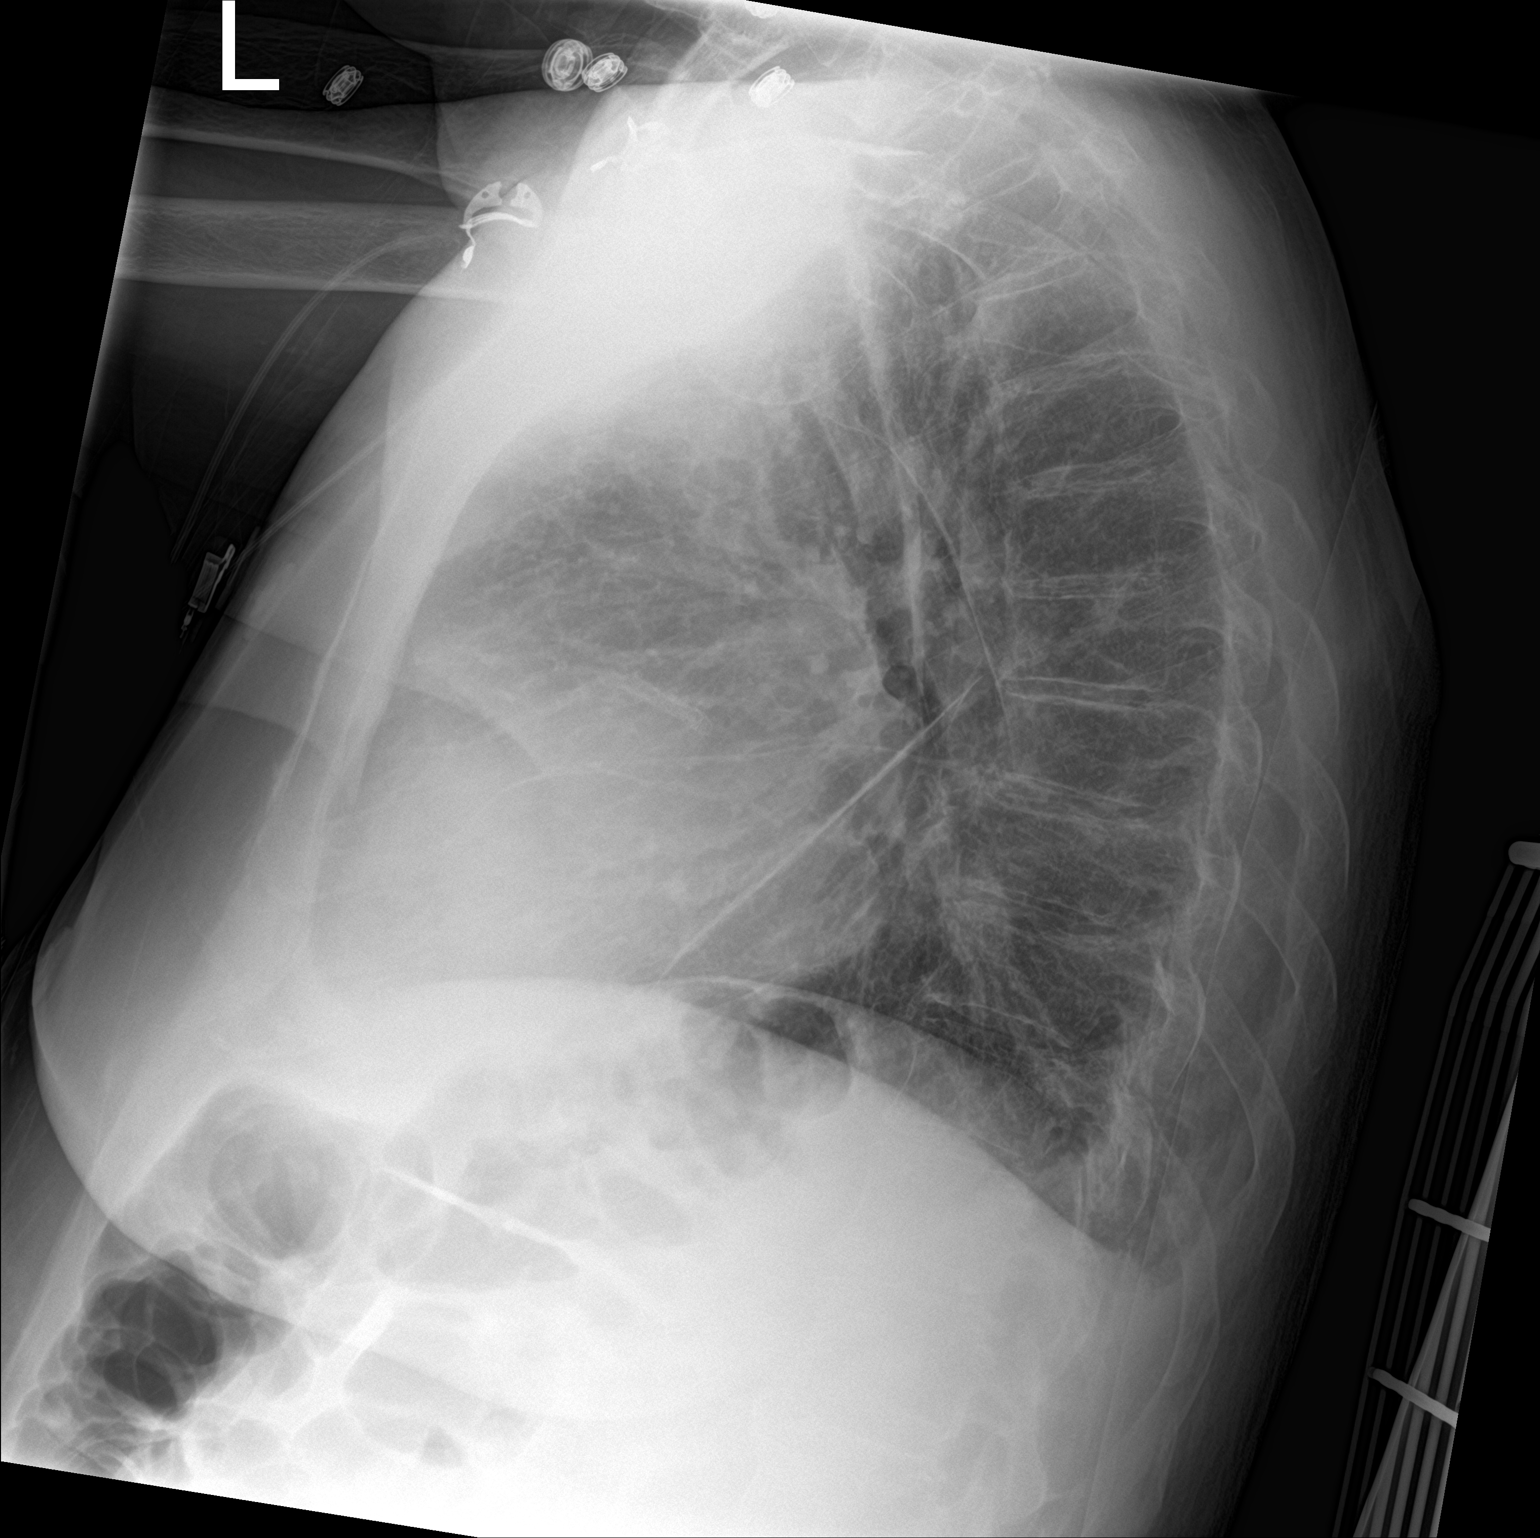

[chest ap]
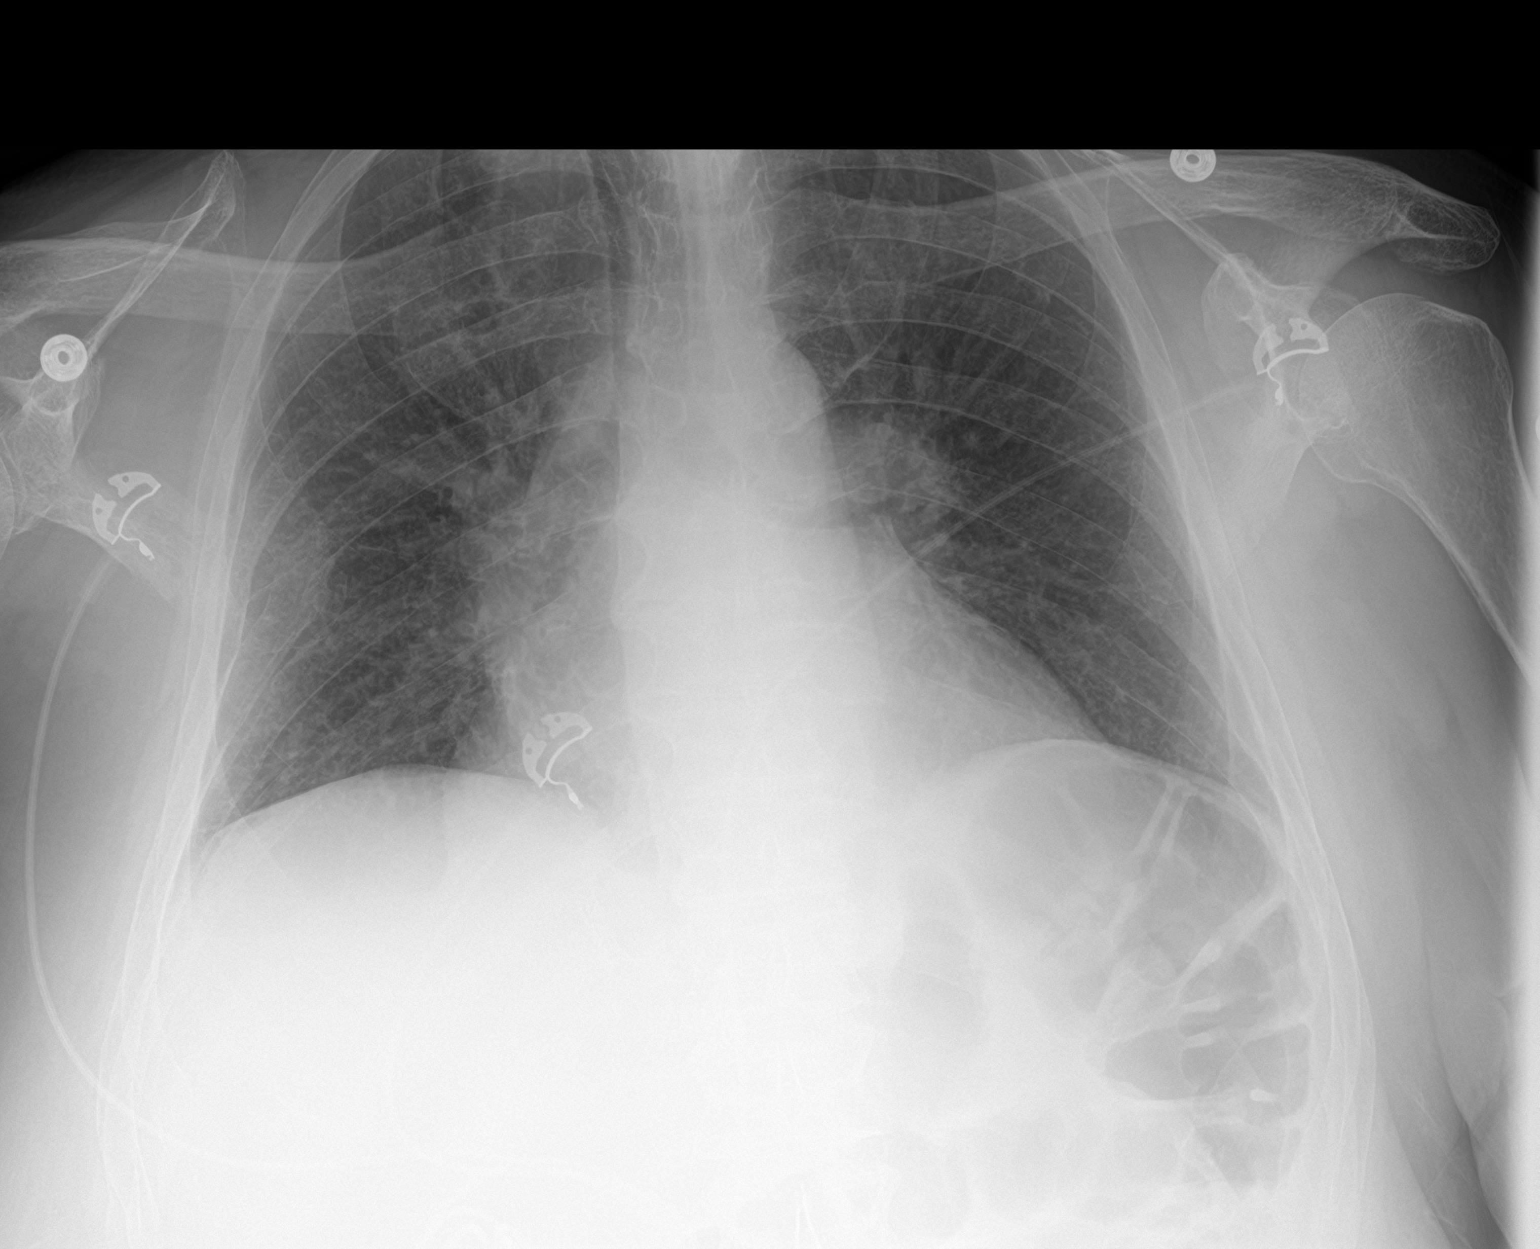

[2 of 2 positions shown; findings below may reference images not displayed]

FINDINGS: Cardiopericardial enlargement accentuated by low volumes. Fissure
thickening on the lateral view with generalized interstitial
prominence. No definite Kerley lines, effusion, or consolidation.
Coronary stent noted on the lateral view
IMPRESSION: Cardiomegaly and borderline edema.
